# Patient Record
Sex: Male | Born: 1950 | Race: White | Hispanic: No | Marital: Married | Smoking: Former smoker
Health system: Southern US, Community
[De-identification: ages and names within clinical notes are randomized; demographics above are authoritative.]

## PROBLEM LIST (undated history)

## (undated) DIAGNOSIS — R7303 Prediabetes: Secondary | ICD-10-CM

## (undated) DIAGNOSIS — F32A Depression, unspecified: Secondary | ICD-10-CM

## (undated) DIAGNOSIS — I10 Essential (primary) hypertension: Secondary | ICD-10-CM

## (undated) DIAGNOSIS — M1990 Unspecified osteoarthritis, unspecified site: Secondary | ICD-10-CM

## (undated) DIAGNOSIS — N200 Calculus of kidney: Secondary | ICD-10-CM

## (undated) DIAGNOSIS — T4145XA Adverse effect of unspecified anesthetic, initial encounter: Secondary | ICD-10-CM

## (undated) DIAGNOSIS — T8859XA Other complications of anesthesia, initial encounter: Secondary | ICD-10-CM

## (undated) DIAGNOSIS — C801 Malignant (primary) neoplasm, unspecified: Secondary | ICD-10-CM

## (undated) DIAGNOSIS — F419 Anxiety disorder, unspecified: Secondary | ICD-10-CM

## (undated) DIAGNOSIS — Z87442 Personal history of urinary calculi: Secondary | ICD-10-CM

## (undated) DIAGNOSIS — F329 Major depressive disorder, single episode, unspecified: Secondary | ICD-10-CM

## (undated) DIAGNOSIS — N529 Male erectile dysfunction, unspecified: Secondary | ICD-10-CM

## (undated) DIAGNOSIS — I4892 Unspecified atrial flutter: Secondary | ICD-10-CM

## (undated) DIAGNOSIS — F1010 Alcohol abuse, uncomplicated: Secondary | ICD-10-CM

## (undated) HISTORY — DX: Calculus of kidney: N20.0

## (undated) HISTORY — PX: OTHER SURGICAL HISTORY: SHX169

## (undated) HISTORY — DX: Anxiety disorder, unspecified: F41.9

## (undated) HISTORY — DX: Depression, unspecified: F32.A

## (undated) HISTORY — PX: ROTATOR CUFF REPAIR: SHX139

## (undated) HISTORY — DX: Male erectile dysfunction, unspecified: N52.9

## (undated) HISTORY — PX: ARTHROSCOPIC REPAIR ACL: SUR80

## (undated) HISTORY — DX: Alcohol abuse, uncomplicated: F10.10

---
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
## 2007-01-03 ENCOUNTER — Emergency Department: Payer: Self-pay | Admitting: Emergency Medicine

## 2008-04-12 ENCOUNTER — Encounter: Admission: RE | Admit: 2008-04-12 | Discharge: 2008-04-12 | Payer: Self-pay | Admitting: Orthopedic Surgery

## 2009-11-29 HISTORY — PX: COLONOSCOPY: SHX174

## 2009-12-02 ENCOUNTER — Ambulatory Visit: Payer: Self-pay | Admitting: Family Medicine

## 2010-01-01 ENCOUNTER — Ambulatory Visit: Payer: Self-pay | Admitting: Family Medicine

## 2011-11-15 ENCOUNTER — Ambulatory Visit (INDEPENDENT_AMBULATORY_CARE_PROVIDER_SITE_OTHER): Payer: PRIVATE HEALTH INSURANCE | Admitting: Family Medicine

## 2011-11-15 VITALS — BP 142/90 | HR 66 | Wt 242.0 lb

## 2011-11-15 DIAGNOSIS — R079 Chest pain, unspecified: Secondary | ICD-10-CM

## 2011-11-15 DIAGNOSIS — R0781 Pleurodynia: Secondary | ICD-10-CM

## 2011-11-15 DIAGNOSIS — N529 Male erectile dysfunction, unspecified: Secondary | ICD-10-CM | POA: Insufficient documentation

## 2011-11-15 DIAGNOSIS — L723 Sebaceous cyst: Secondary | ICD-10-CM

## 2011-11-15 MED ORDER — CARISOPRODOL 350 MG PO TABS: 350.0000 mg | ORAL_TABLET | Freq: Four times a day (QID) | ORAL | Status: AC | PRN

## 2011-11-15 MED ORDER — TADALAFIL 20 MG PO TABS: 20.0000 mg | ORAL_TABLET | Freq: Every day | ORAL | Status: DC | PRN

## 2011-11-15 NOTE — Progress Notes (Signed)
  Subjective:    Patient ID: Lee Lee, male    DOB: February 11, 1951, 60 y.o.   MRN: [id number]  HPI Approximately 10 days ago while playing golf he injured the left lateral rib area. Apparently he hit a stump area and he was able to continue to play. Approximately 3 days after that while reaching or his seatbelt he had the onset of excruciating pain/spasm in the same area. He would also like his Cialis renewed. He also has a lesion present on his left shin that has been there for several months. He indicates that he had a similar lesion several years ago that went away with using ice.   Review of Systems     Objective:   Physical Exam Alert and in no distress. Exam of the chest shows no visible lesions. There is minimal tenderness to palpation in the lateral rib area. Pain was elicited with right lateral motion. Lungs clear to auscultation. No pain with chest wall compression. He has a 3 cm raised fluctuant movable lesion present on the shin.       Assessment & Plan:   1. Rib pain   2. ED (erectile dysfunction)   3. Sebaceous cyst    commend heat, Advil and will give soma compound. Cialis was renewed. Recommend he return here for excision of the cyst at his convenience.

## 2011-11-15 NOTE — Patient Instructions (Signed)
Use heat to the area for 20 minutes 3 times per day. Take 4 Advil 3 times per day regularly for the next week use a muscle relaxer especially at night

## 2011-11-22 ENCOUNTER — Encounter: Payer: Self-pay | Admitting: Internal Medicine

## 2011-11-24 ENCOUNTER — Ambulatory Visit (INDEPENDENT_AMBULATORY_CARE_PROVIDER_SITE_OTHER): Payer: PRIVATE HEALTH INSURANCE | Admitting: Family Medicine

## 2011-11-24 VITALS — BP 146/90 | HR 61 | Wt 248.0 lb

## 2011-11-24 DIAGNOSIS — M6740 Ganglion, unspecified site: Secondary | ICD-10-CM

## 2011-11-24 NOTE — Progress Notes (Signed)
  Subjective:    Patient ID: Lee Lee, male    DOB: 15-Dec-1950, 60 y.o.   MRN: [id number]  HPI He is here for removal of a lesion present on his right it is several centimeters below the knee joint.  Review of Systems     Objective:   Physical Exam A 3 cm round smooth nontender movable lesion is noted on the right shin several centimeters below the knee joint. The area was injected with Xylocaine and a 2 cm incision was made. Clear gelatinous material was removed from it without difficulty. The wound was explored. One 5-0 suture was applied.      Assessment & Plan:  Ganglion cyst He will keep the area clean and dry and return here in one week.

## 2012-09-24 ENCOUNTER — Observation Stay (HOSPITAL_COMMUNITY)
Admission: EM | Admit: 2012-09-24 | Discharge: 2012-09-25 | Disposition: A | Discharge status: Home / Self Care | Payer: PRIVATE HEALTH INSURANCE | Attending: Emergency Medicine | Admitting: Emergency Medicine

## 2012-09-24 ENCOUNTER — Emergency Department (HOSPITAL_COMMUNITY): Payer: PRIVATE HEALTH INSURANCE

## 2012-09-24 ENCOUNTER — Other Ambulatory Visit: Payer: Self-pay

## 2012-09-24 ENCOUNTER — Encounter (HOSPITAL_COMMUNITY): Payer: Self-pay | Admitting: Nurse Practitioner

## 2012-09-24 DIAGNOSIS — R079 Chest pain, unspecified: Secondary | ICD-10-CM

## 2012-09-24 DIAGNOSIS — Z87448 Personal history of other diseases of urinary system: Secondary | ICD-10-CM | POA: Insufficient documentation

## 2012-09-24 DIAGNOSIS — Z8679 Personal history of other diseases of the circulatory system: Secondary | ICD-10-CM | POA: Insufficient documentation

## 2012-09-24 DIAGNOSIS — R0602 Shortness of breath: Secondary | ICD-10-CM | POA: Insufficient documentation

## 2012-09-24 DIAGNOSIS — Z87442 Personal history of urinary calculi: Secondary | ICD-10-CM | POA: Insufficient documentation

## 2012-09-24 DIAGNOSIS — R0789 Other chest pain: Principal | ICD-10-CM | POA: Insufficient documentation

## 2012-09-24 LAB — COMPREHENSIVE METABOLIC PANEL
AST: 58 U/L — ABNORMAL HIGH (ref 0–37)
Albumin: 3.6 g/dL (ref 3.5–5.2)
Alkaline Phosphatase: 70 U/L (ref 39–117)
Chloride: 99 mEq/L (ref 96–112)
Potassium: 3.2 mEq/L — ABNORMAL LOW (ref 3.5–5.1)
Sodium: 134 mEq/L — ABNORMAL LOW (ref 135–145)
Total Bilirubin: 0.4 mg/dL (ref 0.3–1.2)
Total Protein: 6.8 g/dL (ref 6.0–8.3)

## 2012-09-24 LAB — POCT I-STAT TROPONIN I: Troponin i, poc: 0 ng/mL (ref 0.00–0.08)

## 2012-09-24 LAB — URINALYSIS, ROUTINE W REFLEX MICROSCOPIC
Glucose, UA: NEGATIVE mg/dL
Leukocytes, UA: NEGATIVE
Protein, ur: NEGATIVE mg/dL
Specific Gravity, Urine: 1.03 (ref 1.005–1.030)
Urobilinogen, UA: 0.2 mg/dL (ref 0.0–1.0)

## 2012-09-24 LAB — URINE MICROSCOPIC-ADD ON

## 2012-09-24 LAB — BASIC METABOLIC PANEL
Calcium: 9.4 mg/dL (ref 8.4–10.5)
Chloride: 100 mEq/L (ref 96–112)
GFR calc Af Amer: >90 mL/min (ref >90)
GFR calc non Af Amer: >90 mL/min (ref >90)

## 2012-09-24 LAB — CBC
HCT: 43.5 % (ref 39.0–52.0)
MCV: 98 fL (ref 78.0–100.0)
RBC: 4.44 MIL/uL (ref 4.22–5.81)
WBC: 9 10*3/uL (ref 4.0–10.5)

## 2012-09-24 LAB — PROTIME-INR: Prothrombin Time: 13.6 seconds (ref 11.6–15.2)

## 2012-09-24 MED ORDER — SODIUM CHLORIDE 0.9 % IV SOLN
1000.0000 mL | INTRAVENOUS | Status: DC
  Administered 2012-09-24: 1000 mL via INTRAVENOUS

## 2012-09-24 MED ORDER — ACETAMINOPHEN 325 MG PO TABS
650.0000 mg | ORAL_TABLET | Freq: Once | ORAL | Status: AC
  Administered 2012-09-24: 650 mg via ORAL
  Filled 2012-09-24: qty 2

## 2012-09-24 MED ORDER — ASPIRIN 81 MG PO CHEW
324.0000 mg | CHEWABLE_TABLET | Freq: Once | ORAL | Status: AC
  Administered 2012-09-24: 324 mg via ORAL
  Filled 2012-09-24: qty 4

## 2012-09-24 MED ORDER — NITROGLYCERIN 0.4 MG SL SUBL
0.4000 mg | SUBLINGUAL_TABLET | SUBLINGUAL | Status: AC | PRN
  Administered 2012-09-24 – 2012-09-25 (×3): 0.4 mg via SUBLINGUAL
  Filled 2012-09-24: qty 25

## 2012-09-24 NOTE — ED Notes (Signed)
Pt states some relief of chest pain, rating 2/10 at the time.

## 2012-09-24 NOTE — ED Notes (Signed)
Report given to Anne Ng, Therapist, sports. Pt to be moved to CDU on chest pain protocol.

## 2012-09-24 NOTE — ED Notes (Signed)
Today he began to have tightness in chest and SOB while playing golf. Pt reports Friday he felt similar pain while playing tennis. Pt pt denies cardiac history. Pt reports he has felt his heart racing.

## 2012-09-24 NOTE — ED Notes (Signed)
Pt c/o of headache due to nitro given/ Notified PA [employer]

## 2012-09-24 NOTE — ED Provider Notes (Signed)
History     CSN: [id number]  Arrival date & time 09/24/12  1649   None     Chief Complaint  Patient presents with  . Chest Pain    (Consider location/radiation/quality/duration/timing/severity/associated sxs/prior treatment) HPI Comments: 61 year old man says that he had shortness of breath after playing tennis 2 evenings ago. He took 82 baby aspirin this with relief at that time and did not seek medical attention. Today he was golfing and developed chest pressure across his chest around 1 PM. He played 8 holes of golf and walk off the golf course on a pole. He says he's had chest pressure in the past, couple of times in the past 6 months, which would usually resolve if he drank a glass of water. He did not seek evaluation for those episodes. He was observed at the chest pain by Rodell Perna M.D., orthopedist, would advised him and his wife that he needed to have examination and testing for chest pain, and he therefore came to [facility] for evaluation.  Patient is a 61 y.o. male presenting with chest pain. The history is provided by the patient and the spouse. No language interpreter was used.  Chest Pain The chest pain began 3 - 5 hours ago. Chest pain occurs intermittently. The chest pain is resolved. The pain is associated with exertion. At its most intense, the pain is at 5/10. The quality of the pain is described as pressure-like. The pain does not radiate. Chest pain is worsened by exertion. Primary symptoms include shortness of breath. Pertinent negatives for primary symptoms include no fever. He tried aspirin for the symptoms. Risk factors include male gender and obesity. Past medical history comments: Kidney stones, hemorrhoids.     Past Medical History  Diagnosis Date  . Calcium oxalate renal stones   . Hemorrhoids   . ED (erectile dysfunction)     Past Surgical History  Procedure Date  . Colonoscopy 11/2009    Dr. Benson Norway    History reviewed. No pertinent family  history.  History  Substance Use Topics  . Smoking status: Never Smoker   . Smokeless tobacco: Not on file  . Alcohol Use: Yes      Review of Systems  Constitutional: Negative.  Negative for fever and chills.  HENT: Negative.   Eyes: Negative.   Respiratory: Positive for shortness of breath.   Cardiovascular: Positive for chest pain.  Gastrointestinal: Negative.   Musculoskeletal: Negative.   Neurological: Negative.   Hematological: Negative.   Psychiatric/Behavioral: Negative.     Allergies  Review of patient's allergies indicates no known allergies.  Home Medications   Current Outpatient Rx  Name Route Sig Dispense Refill  . IBUPROFEN 200 MG PO TABS Oral Take 200 mg by mouth every 6 (six) hours as needed.      . MULTI-VITAMIN/MINERALS PO TABS Oral Take 1 tablet by mouth daily.        BP 166/119  Pulse 136  Temp 97 F (36.1 C)  Resp 16  SpO2 97%  Physical Exam  Nursing note and vitals reviewed. Constitutional: He is oriented to person, place, and time. He appears well-developed and well-nourished. No distress.       BP 166/119  HENT:  Head: Normocephalic and atraumatic.  Right Ear: External ear normal.  Left Ear: External ear normal.  Mouth/Throat: Oropharynx is clear and moist.  Eyes: Conjunctivae normal and EOM are normal. Pupils are equal, round, and reactive to light.  Neck: Normal range of motion. Neck  supple.  Cardiovascular: Normal rate, regular rhythm and normal heart sounds.   Pulmonary/Chest: Effort normal and breath sounds normal.  Abdominal: Soft. Bowel sounds are normal.  Musculoskeletal: Normal range of motion. He exhibits no edema and no tenderness.  Neurological: He is alert and oriented to person, place, and time.       No sensory or motor deficit.  Skin: Skin is warm and dry.  Psychiatric: He has a normal mood and affect. His behavior is normal.    ED Course  Procedures (including critical care time)   Labs Reviewed  CBC  BASIC  METABOLIC PANEL   0:37 PM  Date: 09/24/2012  Rate: 134  Rhythm: sinus tachycardia  QRS Axis: normal  Intervals: normal  ST/T Wave abnormalities: nonspecific ST/T changes  Conduction Disutrbances:none  Narrative Interpretation: Abnormal EKG  Old EKG Reviewed: none available   Results for orders placed during the hospital encounter of 09/24/12  CBC      Component Value Range   WBC 9.0  4.0 - 10.5 K/uL   RBC 4.44  4.22 - 5.81 MIL/uL   Hemoglobin 15.5  13.0 - 17.0 g/dL   HCT 43.5  39.0 - 52.0 %   MCV 98.0  78.0 - 100.0 fL   MCH 34.9 (*) 26.0 - 34.0 pg   MCHC 35.6  30.0 - 36.0 g/dL   RDW 13.0  11.5 - 15.5 %   Platelets 184  150 - 400 K/uL  BASIC METABOLIC PANEL      Component Value Range   Sodium 138  135 - 145 mEq/L   Potassium 3.4 (*) 3.5 - 5.1 mEq/L   Chloride 100  96 - 112 mEq/L   CO2 26  19 - 32 mEq/L   Glucose, Bld 112 (*) 70 - 99 mg/dL   BUN 15  6 - 23 mg/dL   Creatinine, Ser 0.66  0.50 - 1.35 mg/dL   Calcium 9.4  8.4 - 10.5 mg/dL   GFR calc non Af Amer >90  >90 mL/min   GFR calc Af Amer >90  >90 mL/min  POCT I-STAT TROPONIN I      Component Value Range   Troponin i, poc 0.03  0.00 - 0.08 ng/mL   Comment 3            Dg Chest 2 View  09/24/2012  *RADIOLOGY REPORT*  Clinical Data: Shortness of breath and chest pain 2 minutes ago. Recurrent chest pain today.  CHEST - 2 VIEW  Comparison: None.  Findings: The heart size is normal.  The lungs are clear.  There is fusion across anterior vertebral bodies through the thoracic spine. The vertebral body heights are maintained.  IMPRESSION:  1.  Low lung volumes. 2.  No acute cardiopulmonary disease. 3.  Findings compatible with DISH.   Original Report Authenticated By: Resa Miner. MATTERN, M.D.    6:34 PM.  Lab tests reassuring, did not have STEMI or NSTEMI.  Will move to CDU for chest pain protocol, to have coronary CT in AM.   1. Chest pain          Mylinda Latina III, MD 09/24/12 [phone]

## 2012-09-24 NOTE — ED Provider Notes (Signed)
Assumed care of patient in the CDU.  Patient is currently on the Chest Pain Protocol.  Plan is for that patient to have a Coronary CT in the morning.  Patient presented to the ED with a chief complaint of chest pain and SOB that began today while playing Golf.  He had similar pain two days ago while playing tennis.  No prior cardiac history.  No history of HTN, hyperlipidemia, or DM.  He has never smoked.  Patient is currently not having any CP or SOB.  Reassessed patient.  Patient alert and orientated x 3, Heart RRR, Lungs CTAB, Abdomen soft and nontender, LE without edema.  11:40 PM Reassessed patient.  Patient reports that he is not having any chest pain at this time.  Patient alert and orientated x 3, Heart RRR, Lungs CTAB, Abdomen soft and nontender, LE without edema.  11:58 PM Will sign patient out to Dr. Randal Buba who will assume care of the patient overnight.  Sherlyn Lees Jasper, PA-C 09/24/12 2359

## 2012-09-24 NOTE — ED Notes (Signed)
Per pt request/ pt wanting a sandwich advised pt that he will have to be NPO @4a ,m pt agreed

## 2012-09-24 NOTE — ED Notes (Signed)
Pt undressed, in gown, on monitor, continuous pulse oximetry and blood pressure cuff; family at bedside

## 2012-09-24 NOTE — ED Notes (Signed)
Explained Chest Pain protocol to patient

## 2012-09-24 NOTE — ED Notes (Signed)
Pt presents to department for evaluation of midsternal non radiating chest pain and SOB. Onset Friday while playing tennis, states he had same episode tonight while playing golf. States 4/10 chest pressure at the time. Respirations unlabored. Lung sounds clear and equal bilaterally. Pt is conscious alert and oriented x4. Skin warm and dry.

## 2012-09-25 ENCOUNTER — Other Ambulatory Visit: Payer: Self-pay | Admitting: *Deleted

## 2012-09-25 ENCOUNTER — Observation Stay (HOSPITAL_COMMUNITY): Payer: PRIVATE HEALTH INSURANCE

## 2012-09-25 DIAGNOSIS — I77810 Thoracic aortic ectasia: Secondary | ICD-10-CM

## 2012-09-25 MED ORDER — IOHEXOL 350 MG/ML SOLN
80.0000 mL | Freq: Once | INTRAVENOUS | Status: AC | PRN
  Administered 2012-09-25: 80 mL via INTRAVENOUS

## 2012-09-25 MED ORDER — ACETAMINOPHEN 325 MG PO TABS
650.0000 mg | ORAL_TABLET | Freq: Once | ORAL | Status: AC
  Administered 2012-09-25: 650 mg via ORAL
  Filled 2012-09-25: qty 2

## 2012-09-25 MED ORDER — METOPROLOL TARTRATE 1 MG/ML IV SOLN
INTRAVENOUS | Status: AC
  Administered 2012-09-25: 5 mg
  Filled 2012-09-25: qty 5

## 2012-09-25 MED ORDER — METOPROLOL TARTRATE 25 MG PO TABS
50.0000 mg | ORAL_TABLET | Freq: Once | ORAL | Status: AC
  Administered 2012-09-25: 50 mg via ORAL
  Filled 2012-09-25: qty 2

## 2012-09-25 NOTE — ED Notes (Addendum)
Reassessed pt pain, pt denies pain at the time

## 2012-09-25 NOTE — ED Notes (Signed)
Pt BMI is 31.9/ Pt Pulse is 63/ ordered 50 mg metoprolol per protocol for CTA

## 2012-09-25 NOTE — ED Provider Notes (Signed)
Medical screening examination/treatment/procedure(s) were performed by non-physician practitioner and as supervising physician I was immediately available for consultation/collaboration.  Veryl Speak, MD 09/25/12 [phone]

## 2012-09-25 NOTE — ED Notes (Signed)
Pt return from CT.

## 2012-09-25 NOTE — ED Provider Notes (Signed)
Pt seen and examined by me in CDU. Pt with episode of chest pressure after playing tennis 3 days ago, and after playing golf yesterday. States associated with shortness of breath. No hx of cardiac problems. Pt in CDU on CP protocol, with cardiac CT to be done this AM.   Pt had uneventful night, he has been symptom free. Labs unremarkable with negative 3 sets of cardiac enzymes.   Exam: Pt in NAD. AAOx3. PERRLA. Neck is supple. Regular HR and rhythm. Lungs are clear to auscultation bilaterally. Normal coordination. Normal gait. Neurovascularly intact.    Filed Vitals:   09/25/12 0522  BP: 154/96  Pulse: 63  Temp:   Resp:     8:05 AM Pt taken to CT. Chest pain free.    9:47 AM Results for orders placed during the hospital encounter of 09/24/12  CBC      Component Value Range   WBC 9.0  4.0 - 10.5 K/uL   RBC 4.44  4.22 - 5.81 MIL/uL   Hemoglobin 15.5  13.0 - 17.0 g/dL   HCT 43.5  39.0 - 52.0 %   MCV 98.0  78.0 - 100.0 fL   MCH 34.9 (*) 26.0 - 34.0 pg   MCHC 35.6  30.0 - 36.0 g/dL   RDW 13.0  11.5 - 15.5 %   Platelets 184  150 - 400 K/uL  BASIC METABOLIC PANEL      Component Value Range   Sodium 138  135 - 145 mEq/L   Potassium 3.4 (*) 3.5 - 5.1 mEq/L   Chloride 100  96 - 112 mEq/L   CO2 26  19 - 32 mEq/L   Glucose, Bld 112 (*) 70 - 99 mg/dL   BUN 15  6 - 23 mg/dL   Creatinine, Ser 0.66  0.50 - 1.35 mg/dL   Calcium 9.4  8.4 - 10.5 mg/dL   GFR calc non Af Amer >90  >90 mL/min   GFR calc Af Amer >90  >90 mL/min  COMPREHENSIVE METABOLIC PANEL      Component Value Range   Sodium 134 (*) 135 - 145 mEq/L   Potassium 3.2 (*) 3.5 - 5.1 mEq/L   Chloride 99  96 - 112 mEq/L   CO2 26  19 - 32 mEq/L   Glucose, Bld 107 (*) 70 - 99 mg/dL   BUN 14  6 - 23 mg/dL   Creatinine, Ser 0.69  0.50 - 1.35 mg/dL   Calcium 9.0  8.4 - 10.5 mg/dL   Total Protein 6.8  6.0 - 8.3 g/dL   Albumin 3.6  3.5 - 5.2 g/dL   AST 58 (*) 0 - 37 U/L   ALT 37  0 - 53 U/L   Alkaline Phosphatase 70  39 - 117  U/L   Total Bilirubin 0.4  0.3 - 1.2 mg/dL   GFR calc non Af Amer >90  >90 mL/min   GFR calc Af Amer >90  >90 mL/min  PROTIME-INR      Component Value Range   Prothrombin Time 13.6  11.6 - 15.2 seconds   INR 1.05  0.00 - 1.49  APTT      Component Value Range   aPTT 31  24 - 37 seconds  URINALYSIS, ROUTINE W REFLEX MICROSCOPIC      Component Value Range   Color, Urine YELLOW  YELLOW   APPearance CLOUDY (*) CLEAR   Specific Gravity, Urine 1.030  1.005 - 1.030   pH 5.5  5.0 - 8.0  Glucose, UA NEGATIVE  NEGATIVE mg/dL   Hgb urine dipstick MODERATE (*) NEGATIVE   Bilirubin Urine SMALL (*) NEGATIVE   Ketones, ur NEGATIVE  NEGATIVE mg/dL   Protein, ur NEGATIVE  NEGATIVE mg/dL   Urobilinogen, UA 0.2  0.0 - 1.0 mg/dL   Nitrite NEGATIVE  NEGATIVE   Leukocytes, UA NEGATIVE  NEGATIVE  POCT I-STAT TROPONIN I      Component Value Range   Troponin i, poc 0.03  0.00 - 0.08 ng/mL   Comment 3           POCT I-STAT TROPONIN I      Component Value Range   Troponin i, poc 0.01  0.00 - 0.08 ng/mL   Comment 3           URINE MICROSCOPIC-ADD ON      Component Value Range   Squamous Epithelial / LPF RARE  RARE   WBC, UA 0-2  <3 WBC/hpf   RBC / HPF 3-6  <3 RBC/hpf   Bacteria, UA RARE  RARE   Casts HYALINE CASTS (*) NEGATIVE   Urine-Other MUCOUS PRESENT    POCT I-STAT TROPONIN I      Component Value Range   Troponin i, poc 0.00  0.00 - 0.08 ng/mL   Comment 3            Dg Chest 2 View  09/24/2012  *RADIOLOGY REPORT*  Clinical Data: Shortness of breath and chest pain 2 minutes ago. Recurrent chest pain today.  CHEST - 2 VIEW  Comparison: None.  Findings: The heart size is normal.  The lungs are clear.  There is fusion across anterior vertebral bodies through the thoracic spine. The vertebral body heights are maintained.  IMPRESSION:  1.  Low lung volumes. 2.  No acute cardiopulmonary disease. 3.  Findings compatible with DISH.   Original Report Authenticated By: Resa Miner. MATTERN, M.D.     Ct Heart Morp W/cta Cor W/score W/ca W/cm &/or Wo/cm  09/25/2012  *RADIOLOGY REPORT*  INDICATION:  Chest pain 2 days ago and today.  Pressure.  No radiation.  CT ANGIOGRAPHY OF THE HEART, CORONARY ARTERY, STRUCTURE, AND MORPHOLOGY  CONTRAST: 108m OMNIPAQUE IOHEXOL 350 MG/ML SOLN  COMPARISON:  Plain film of 1 day prior  TECHNIQUE:  CT angiography of the coronary vessels was performed on a 256 channel system using prospective ECG gating.  A scout and noncontrast exam (for calcium scoring) were performed.  Circulation time was measured using a test bolus.  Coronary CTA was performed with sub mm slice collimation during portions of the cardiac cycle after prior injection of iodinated contrast.  Imaging post processing was performed on an independent workstation creating multiplanar and 3-D images, and quantitative analysis of the heart and coronary arteries.  Note that this exam targets the heart and the chest was not imaged in its entirety.  PREMEDICATION: Lopressor 50 mg, P.O. Lopressor 5.0 mg, IV Nitroglycerin 0.4 mcg, sublingual.  FINDINGS: Technical quality:  Moderate  Heart rate:  59  CORONARY ARTERIES: Left main coronary artery:  Long vessel which arises from the left coronary cusp and is normal. Left anterior descending:  Gives rise to a diminutive patent first diagonal.  Moderate sized second diagonal which is normal.  A third diminutive patent diagonal.  Somewhat poorly evaluated distally, but appears patent as it wraps around cardiac apex. A ramus branch is present.  This is moderate sized and negative. Left circumflex:  Moderate sized, nondominant.  Gives rise to a moderate-sized branching  obtuse marginal, which is negative. Portions of the more distal left circumflex are poorly evaluated. Appears patent distally. Right coronary artery:  Large, dominant vessel without significant disease.  Continues distally to supply the PDA and posterolateral left ventricular branches. Gives rise to a moderate-sized  acute marginal. Posterior descending artery:  Normal. Dominance:  Right-sided  CORONARY CALCIUM: Total Agatston Score:  0  CARDIAC No left atrial appendage thrombus.  No septal defect.  No cardiac mass.  AORTA AND PULMONARY MEASUREMENTS: Aortic root (21 - 40 mm):             29  at the annulus             43  at the sinuses of Valsalva             34  at the sinotubular junction Ascending aorta ( <  40 mm):  40 Descending aorta ( <  40 mm):  34 Main pulmonary artery:  ( <  30 mm):  31  EXTRACARDIAC FINDINGS: Lung windows demonstrate no airspace opacities.  Soft tissue windows demonstrate no imaged thoracic adenopathy.  No aortic dissection.  Trace pleural thickening.  No pericardial effusion.  Limited abdominal imaging demonstrates moderate hepatic steatosis.  Advanced thoracic spondylosis.  IMPRESSION:  1.  No coronary artery disease.  The patient's total coronary artery calcium score is 0. 2.  Right-sided coronary artery dominance. 3.  Minimal dilatation of the pulmonary arteries.  This could represent pulmonary arterial hypertension. 4.  Borderline/mild dilatation of the ascending aorta and sinuses of Valsalva.  Report was called to CDU mid level at 9:20 a.m. on 09/25/2012.   Original Report Authenticated By: Areta Haber, M.D.     9:47 AM Cardiac CT negative for CAD. There was minimal dilatation of pulmonary arteries and ascending aorta and sinuses. This was Discussed With Dr. Jobe Igo, who recommended outpatient cardiology follow up for an echo. I spoke with [facility] who will contact pt either today or tomorrow for a follow up appointment. I discussed results and the plan with the pt. Pt is stable for d/c home.   Filed Vitals:   09/25/12 0953  BP: 149/84  Pulse: 54  Temp: 98.7 F (37.1 C)  Resp: 21       [place] Silveria A Rosemaria Inabinet, PA 09/25/12 1009

## 2012-09-25 NOTE — Progress Notes (Signed)
Utilization review completed.  

## 2012-09-25 NOTE — ED Provider Notes (Signed)
Medical screening examination/treatment/procedure(s) were conducted as a shared visit with non-physician practitioner(s) and myself.  I personally evaluated the patient during the encounter See my note.   Mylinda Latina III, MD 09/25/12 [phone]

## 2012-09-28 ENCOUNTER — Ambulatory Visit (HOSPITAL_COMMUNITY): Payer: PRIVATE HEALTH INSURANCE | Attending: Cardiology | Admitting: Radiology

## 2012-09-28 DIAGNOSIS — R072 Precordial pain: Secondary | ICD-10-CM

## 2012-09-28 DIAGNOSIS — I77810 Thoracic aortic ectasia: Secondary | ICD-10-CM

## 2012-09-28 DIAGNOSIS — I080 Rheumatic disorders of both mitral and aortic valves: Secondary | ICD-10-CM | POA: Insufficient documentation

## 2012-09-28 NOTE — Progress Notes (Signed)
Echocardiogram performed.  

## 2012-10-02 ENCOUNTER — Encounter: Payer: Self-pay | Admitting: *Deleted

## 2012-10-03 ENCOUNTER — Other Ambulatory Visit (HOSPITAL_COMMUNITY): Payer: PRIVATE HEALTH INSURANCE

## 2012-10-05 ENCOUNTER — Ambulatory Visit (INDEPENDENT_AMBULATORY_CARE_PROVIDER_SITE_OTHER): Payer: PRIVATE HEALTH INSURANCE | Admitting: Family Medicine

## 2012-10-05 DIAGNOSIS — R002 Palpitations: Secondary | ICD-10-CM

## 2012-10-05 LAB — LIPID PANEL: Cholesterol: 196 mg/dL (ref 0–200)

## 2012-10-05 LAB — TSH: TSH: 0.668 u[IU]/mL (ref 0.350–4.500)

## 2012-10-05 NOTE — Progress Notes (Signed)
  Subjective:    Patient ID: Lee Lee, male    DOB: 07/24/1951, 61 y.o.   MRN: [id number]  HPI He is here for consultation after recent visit to the emergency room for evaluation of chest discomfort. The ER record including echocardiogram was reviewed. All the blood work was also reviewed all of which was essentially negative. The echocardiogram was also negative. Further history indicates he has had episodes of palpitations usually occurring 2 or 3 times per month they can be random in nature. He is on no be decongestants, drinks one cup of coffee per day. He is on no other medications   Review of Systems     Objective:   Physical Exam Alert and in no distress. Cardiac exam shows regular rhythm without murmurs gallops. Lungs are clear to auscultation.       Assessment & Plan:   1. Palpitations  TSH, Lipid panel, Cardiac event monitor

## 2012-10-24 ENCOUNTER — Encounter: Payer: PRIVATE HEALTH INSURANCE | Admitting: Physician Assistant

## 2012-10-31 ENCOUNTER — Encounter: Payer: PRIVATE HEALTH INSURANCE | Admitting: Physician Assistant

## 2012-11-01 ENCOUNTER — Encounter: Payer: PRIVATE HEALTH INSURANCE | Admitting: Physician Assistant

## 2012-11-01 ENCOUNTER — Encounter (INDEPENDENT_AMBULATORY_CARE_PROVIDER_SITE_OTHER): Payer: PRIVATE HEALTH INSURANCE

## 2012-11-01 DIAGNOSIS — R002 Palpitations: Secondary | ICD-10-CM

## 2012-11-06 ENCOUNTER — Encounter: Payer: PRIVATE HEALTH INSURANCE | Admitting: Cardiology

## 2012-11-06 ENCOUNTER — Ambulatory Visit (INDEPENDENT_AMBULATORY_CARE_PROVIDER_SITE_OTHER): Payer: PRIVATE HEALTH INSURANCE | Admitting: Cardiology

## 2012-11-06 ENCOUNTER — Encounter: Payer: Self-pay | Admitting: Cardiology

## 2012-11-06 VITALS — BP 166/96 | HR 65 | Ht 72.0 in | Wt 246.0 lb

## 2012-11-06 DIAGNOSIS — I10 Essential (primary) hypertension: Secondary | ICD-10-CM

## 2012-11-06 DIAGNOSIS — I712 Thoracic aortic aneurysm, without rupture, unspecified: Secondary | ICD-10-CM

## 2012-11-06 DIAGNOSIS — R002 Palpitations: Secondary | ICD-10-CM

## 2012-11-06 DIAGNOSIS — I7121 Aneurysm of the ascending aorta, without rupture: Secondary | ICD-10-CM

## 2012-11-06 MED ORDER — LOSARTAN POTASSIUM 50 MG PO TABS: 50.0000 mg | ORAL_TABLET | Freq: Every day | ORAL | Status: DC

## 2012-11-06 NOTE — Progress Notes (Signed)
Patient ID: Lee Lee, male   DOB: September 21, 1951, 61 y.o.   MRN: [id number] PCP: Dr. Redmond School  61 yo with history of palpitations presents for cardiology evaluation.  For the last couple of years, patient has had episodes of tachypalpitations.  He has had 4 total this year.  They will last for 30-60 minutes at a time and have no particular trigger.  No lightheadedness or syncope.  In 59/13, he had tachypalpitations while playing golf with associated chest pressure.  He went to the ER.  While there, he had a coronary CT angiogram with no significant CAD.  Echo showed normal EF but the ascending aorta was dilated to 4.2 cm.  He is now wearing an event monitor.    Recently, he has felt well.  No recurrent tachypalpitations.  Good exercise tolerance with no exertional chest pain.  If he walks fast, he is out of breath after walking about a 1/2 mile.  BP is high today and has been high recently in Dr. Lanice Shirts office.   ECG: NSR, normal  Labs (11/13): TSH normal, LDL 89, HDL 56  PMH: 1. HTN 2. Erectile dysfunction 3. Palpitations: Echo (10/13) with EF 55-60%, mild LVH, trileaflet aortic valve with 4.1 cm aortic root and 4.2 cm ascending aorta.  4. Ascending aortic aneurysm: 4.2 cm on echo and CT in 10/13.  Aortic valve is trileaflet.  Suspect this is due to HTN.  5. Atypical chest pain: Coronary CT angiogram in 10/13 with coronary calcium score 0 and no significant CAD noted in the coronaries.   SH: Nonsmoker, married, works in Multimedia programmer.   FH: Mother had valvular heart disease, sounds like AI.   ROS: All systems reviewed and negative except as per HPI.   Current Outpatient Prescriptions  Medication Sig Dispense Refill  . tadalafil (CIALIS) 20 MG tablet Take 20 mg by mouth daily as needed. For ED      . losartan (COZAAR) 50 MG tablet Take 1 tablet (50 mg total) by mouth daily.  30 tablet  3  . tadalafil (CIALIS) 20 MG tablet Take 1 tablet (20 mg total) by mouth daily as needed for  erectile dysfunction.  10 tablet  11    BP 166/96  Pulse 65  Ht 6' (1.829 m)  Wt 246 lb (111.585 kg)  BMI 33.36 kg/m2 General: NAD Neck: No JVD, no thyromegaly or thyroid nodule.  Lungs: Clear to auscultation bilaterally with normal respiratory effort. CV: Nondisplaced PMI.  Heart regular S1/S2, +S4, no murmur.  No peripheral edema.  No carotid bruit.  Normal pedal pulses.  Abdomen: Soft, nontender, no hepatosplenomegaly, no distention.  Skin: Intact without lesions or rashes.  Neurologic: Alert and oriented x 3.  Psych: Normal affect. Extremities: No clubbing or cyanosis.  HEENT: Normal.   Assessment/Plan: 1. Palpitations: Patient has had episodes of tachypalpitations with heart pounding for 30-60 minutes.  Episodes are relatively rare (4 this year).  Last episode was associated with chest pressure.  No lightheadedness or syncope.  Possible SVT.  - He has on a 30 day event monitor.  I will follow to see if anything is found.  - I am going to start him on losartan today (see below).  If he needs additional BP control, would consider beta blocker as it may help limit palpitations.  2. HTN: BP running high.  I will start him on losartan 50 mg daily as ARBs may decrease progression of ascending aortic aneurysms.   BMET and BP check in 2 wks.  3. Ascending aortic aneurysm: Mild, 4.2 cm.  Trileaflet aortic valve.  I suspect this is due to hypertension (no evidence for collagen vascular disease).  Starting ARB as these have decreased progression of ascending aorta dilation in patients with collagen vascular disease and this patient additionally needs a medication for BP control.  I will get an MRA chest to follow thoracic aorta dimensions in 10/14.  If this is stable, can follow at 2-3 year intervals.   [name] 11/07/2012 4:35 PM

## 2012-11-06 NOTE — Patient Instructions (Addendum)
Take losartan 23m daily.  Your physician recommends that you return for lab work in: 2 weeks--BMET.  Take and record your blood pressure daily. I will call you in about 3 weeks to get the readings.   Your physician recommends that you schedule a follow-up appointment in: 2 months with Dr MAundra Dubin

## 2012-11-07 DIAGNOSIS — I10 Essential (primary) hypertension: Secondary | ICD-10-CM | POA: Insufficient documentation

## 2012-11-07 DIAGNOSIS — R002 Palpitations: Secondary | ICD-10-CM | POA: Insufficient documentation

## 2012-11-07 DIAGNOSIS — I7121 Aneurysm of the ascending aorta, without rupture: Secondary | ICD-10-CM | POA: Insufficient documentation

## 2012-11-07 DIAGNOSIS — I712 Thoracic aortic aneurysm, without rupture: Secondary | ICD-10-CM | POA: Insufficient documentation

## 2012-11-16 ENCOUNTER — Telehealth: Payer: Self-pay | Admitting: Cardiology

## 2012-11-16 NOTE — Telephone Encounter (Signed)
E cardio report showing svt was received from E [facility].  I talked to Dr Angelena Form who advised that pt be called.  Pt states he is doing fine.  No symptoms when he had the svt.  He states he was in the process of putting the monitor back on after his shower when it started buzzing or beeping.  He states his bp was 138/70 today.  He states he has had 3 episodes of fluttering in the past 2 weeks, none today. E cardio report was left for Dr Aundra Dubin to review when he is back in the office tomorrow.

## 2012-11-16 NOTE — Telephone Encounter (Signed)
New Problem:    Patient returned your call.  Please call back.

## 2012-11-16 NOTE — Telephone Encounter (Signed)
N/A.  LMTC. 

## 2012-11-17 MED ORDER — METOPROLOL SUCCINATE ER 25 MG PO TB24: 25.0000 mg | ORAL_TABLET | Freq: Every day | ORAL | Status: DC

## 2012-11-17 NOTE — Telephone Encounter (Signed)
Dr Aundra Dubin spoke with pt. He will add Toprol XL 40m daily.

## 2012-11-20 ENCOUNTER — Other Ambulatory Visit (INDEPENDENT_AMBULATORY_CARE_PROVIDER_SITE_OTHER): Payer: PRIVATE HEALTH INSURANCE

## 2012-11-20 DIAGNOSIS — I10 Essential (primary) hypertension: Secondary | ICD-10-CM

## 2012-11-20 LAB — BASIC METABOLIC PANEL
BUN: 16 mg/dL (ref 6–23)
Chloride: 104 mEq/L (ref 96–112)
Glucose, Bld: 114 mg/dL — ABNORMAL HIGH (ref 70–99)
Potassium: 4 mEq/L (ref 3.5–5.1)

## 2012-11-21 ENCOUNTER — Telehealth: Payer: Self-pay | Admitting: *Deleted

## 2012-11-21 NOTE — Telephone Encounter (Signed)
Message copied by Earvin Hansen on Tue Nov 21, 2012  8:24 AM ------      Message from: Larey Dresser      Created: Mon Nov 20, 2012  9:20 PM       Labs ok

## 2012-11-21 NOTE — Telephone Encounter (Signed)
Left message on recorder, no changes in medications

## 2012-12-07 ENCOUNTER — Encounter: Payer: Self-pay | Admitting: Cardiology

## 2012-12-07 ENCOUNTER — Ambulatory Visit (INDEPENDENT_AMBULATORY_CARE_PROVIDER_SITE_OTHER): Payer: BC Managed Care – PPO | Admitting: Cardiology

## 2012-12-07 VITALS — BP 144/100 | HR 92 | Ht 72.0 in | Wt 242.0 lb

## 2012-12-07 DIAGNOSIS — I4892 Unspecified atrial flutter: Secondary | ICD-10-CM | POA: Insufficient documentation

## 2012-12-07 DIAGNOSIS — I712 Thoracic aortic aneurysm, without rupture, unspecified: Secondary | ICD-10-CM

## 2012-12-07 DIAGNOSIS — I10 Essential (primary) hypertension: Secondary | ICD-10-CM

## 2012-12-07 DIAGNOSIS — I7121 Aneurysm of the ascending aorta, without rupture: Secondary | ICD-10-CM

## 2012-12-07 MED ORDER — METOPROLOL SUCCINATE ER 50 MG PO TB24: 50.0000 mg | ORAL_TABLET | Freq: Every day | ORAL | Status: DC

## 2012-12-07 MED ORDER — APIXABAN 5 MG PO TABS: 5.0000 mg | ORAL_TABLET | Freq: Two times a day (BID) | ORAL | Status: DC

## 2012-12-07 NOTE — Patient Instructions (Addendum)
Stop aspirin.   Start Eliquis(apixaban) 45m two times a day.   Increase Toprol XL (metoprolol) to 526mdaily. You can take 2 of your 2537mablets daily at the same time and use your current supply.  Your physician recommends that you schedule a follow-up appointment in: 2 months with Dr McLAundra Dubinhis is scheduled for Wednesday March 5,2014 at 8:15am.  Dr TayLovena Leursday January 16,2014 at 12:15pm.

## 2012-12-07 NOTE — Progress Notes (Signed)
Patient ID: Lee Lee, male   DOB: Oct 05, 1951, 62 y.o.   MRN: [id number] PCP: Dr. Redmond School  62 yo with history of palpitations returns for cardiology evaluation.  For the last couple of years, patient has had episodes of tachypalpitations.  There episodes have been occurring more frequently, especially over the last couple of months. They will last for 30-60 minutes at a time and have no particular trigger.  No lightheadedness or syncope.  In 10/13, he had tachypalpitations while playing golf with associated chest pressure.  He went to the ER.  While there, he had a coronary CT angiogram with no significant CAD.  Echo showed normal EF but the ascending aorta was dilated to 4.2 cm.    [name] wore and event monitor for 3 wks.  This showed paroxysmal atrial flutter with HR to as high as the 150s.  He has been under more stress recently with his sick father (who actually passed away this morning) and has been noticing more frequent runs of tachypalpitations.  I started him on Toprol XL.  This has not made much difference for his symptoms.   Labs (11/13): TSH normal, LDL 89, HDL 56 Labs (12/13): K 4, creatinine 0.8  PMH: 1. HTN 2. Erectile dysfunction 3. Palpitations: Echo (10/13) with EF 55-60%, mild LVH, trileaflet aortic valve with 4.1 cm aortic root and 4.2 cm ascending aorta.  4. Ascending aortic aneurysm: 4.2 cm on echo and CT in 10/13.  Aortic valve is trileaflet.  Suspect this is due to HTN.  5. Atypical chest pain: Coronary CT angiogram in 10/13 with coronary calcium score 0 and no significant CAD noted in the coronaries.  6. Paroxysmal atrial flutter: First noted on 3 week event monitor in 12/13.  SH: Nonsmoker, married, works in Multimedia programmer.   FH: Mother had valvular heart disease, sounds like AI.   ROS: All systems reviewed and negative except as per HPI.   Current Outpatient Prescriptions  Medication Sig Dispense Refill  . losartan (COZAAR) 50 MG tablet Take 1 tablet (50  mg total) by mouth daily.  30 tablet  3  . tadalafil (CIALIS) 20 MG tablet Take 20 mg by mouth daily as needed. For ED      . apixaban (ELIQUIS) 5 MG TABS tablet Take 1 tablet (5 mg total) by mouth 2 (two) times daily.  60 tablet  6  . metoprolol succinate (TOPROL XL) 50 MG 24 hr tablet Take 1 tablet (50 mg total) by mouth daily. Take with or immediately following a meal.  30 tablet  6    BP 144/100  Pulse 92  Ht 6' (1.829 m)  Wt 242 lb (109.77 kg)  BMI 32.82 kg/m2 General: NAD Neck: No JVD, no thyromegaly or thyroid nodule.  Lungs: Clear to auscultation bilaterally with normal respiratory effort. CV: Nondisplaced PMI.  Heart regular S1/S2, +S4, no murmur.  No peripheral edema.  No carotid bruit.  Normal pedal pulses.  Abdomen: Soft, nontender, no hepatosplenomegaly, no distention.  Skin: Intact without lesions or rashes.  Neurologic: Alert and oriented x 3.  Psych: Normal affect. Extremities: No clubbing or cyanosis.  HEENT: Normal.   Assessment/Plan: 1. Palpitations:  Due to paroxysmal atrial flutter.  He finds the flutter runs very bothersome.  He is on Toprol XL which does not seem to be having much effect.  We talked about possible treatment strategies, medical versus catheter ablation.  He would prefer catheter ablation.  CHADSVASC = 1 for HTN.  - Increase Toprol XL  to 50 mg daily.  - Start apixaban 5 mg bid in preparation for atrial flutter ablation.  Can stop ASA.  - EP evaluation for atrial flutter ablation.  2. HTN: BP running high but just heard that his father has died.  I will increase Toprol XL to 50 mg daily and continue losartan.  3. Ascending aortic aneurysm: Mild, 4.2 cm.  Trileaflet aortic valve.  I suspect this is due to hypertension (no evidence for collagen vascular disease).  Patient is on an ARB (ARBs seem to decrease progression of ascending aorta dilation in patients with collagen vascular disease).  I will get an MRA chest to follow thoracic aorta dimensions in  10/14.  If this is stable, can follow at 2-3 year intervals.   [name] 12/07/2012

## 2012-12-14 ENCOUNTER — Encounter: Payer: Self-pay | Admitting: Internal Medicine

## 2012-12-14 ENCOUNTER — Ambulatory Visit (INDEPENDENT_AMBULATORY_CARE_PROVIDER_SITE_OTHER): Payer: BC Managed Care – PPO | Admitting: Internal Medicine

## 2012-12-14 VITALS — BP 155/83 | HR 58 | Ht 72.0 in | Wt 241.1 lb

## 2012-12-14 DIAGNOSIS — I4892 Unspecified atrial flutter: Secondary | ICD-10-CM

## 2012-12-14 NOTE — Progress Notes (Signed)
HPI [name] is referred today by Dr. Marigene Ehlers for evaluation of atrial flutter. The patient has a history of hypertension. He also is a history of kidney stones. His health is otherwise been good. Several months ago he began to experience palpitations. Subsequent evaluation demonstrated periods of atrial flutter with rapid ventricular response. Additional evaluation demonstrated brief episodes of atrial fibrillation as well as probable atrial tachycardia. The patient cannot distinguish between his arrhythmias but notices that his heart race at times for several minutes to over an hour. He was placed on anticoagulation. He was placed on escalating doses of beta blocker. Since increasing his metoprolol XL to 50 mg daily, his symptoms of palpitations have improved though he has noted increasing fatigue and weakness. He desires not to be on more medical therapy than absolute necessary. No Known Allergies   Current Outpatient Prescriptions  Medication Sig Dispense Refill  . apixaban (ELIQUIS) 5 MG TABS tablet Take 1 tablet (5 mg total) by mouth 2 (two) times daily.  60 tablet  6  . losartan (COZAAR) 50 MG tablet Take 1 tablet (50 mg total) by mouth daily.  30 tablet  3  . metoprolol succinate (TOPROL XL) 50 MG 24 hr tablet Take 1 tablet (50 mg total) by mouth daily. Take with or immediately following a meal.  30 tablet  6  . tadalafil (CIALIS) 20 MG tablet Take 20 mg by mouth daily as needed. For ED         Past Medical History  Diagnosis Date  . Calcium oxalate renal stones   . Hemorrhoids   . ED (erectile dysfunction)     ROS:   All systems reviewed and negative except as noted in the HPI.   Past Surgical History  Procedure Date  . Colonoscopy 11/2009    Dr. Benson [place]     No family history on file.   History   Social History  . Marital Status: Married    Spouse Name: N/A    Number of Children: N/A  . Years of Education: N/A   Occupational History  . Not on file.   Social  History Main Topics  . Smoking status: Never Smoker   . Smokeless tobacco: Not on file  . Alcohol Use: Yes  . Drug Use: No  . Sexually Active:    Other Topics Concern  . Not on file   Social History Narrative  . No narrative on file     BP 155/83  Pulse 58  Ht 6' (1.829 m)  Wt 241 lb 1.9 oz (109.371 kg)  BMI 32.70 kg/m2  Physical Exam:  Well appearing middle-aged man, NAD HEENT: Unremarkable Neck:  6 cm JVD, no thyromegally Lungs:  Clear with no wheezes, rales, or rhonchi. HEART:  Regular rate rhythm, no murmurs, no rubs, no clicks Abd:  soft, positive bowel sounds, no organomegally, no rebound, no guarding Ext:  2 plus pulses, no edema, no cyanosis, no clubbing Skin:  No rashes no nodules Neuro:  CN II through XII intact, motor grossly intact  EKG review of his cardiac monitor demonstrates atrial flutter with a rapid ventricular response. It also demonstrates probable very brief episodes of atrial fibrillation with a more controlled ventricular response. Finally it demonstrates rare atrial tachycardia.  Assess/Plan:

## 2012-12-14 NOTE — Assessment & Plan Note (Signed)
I discussed the treatment options with the patient and his wife in detail. He has an indication for catheter ablation. I cannot guarantee however that he will not have atrial fibrillation or perhaps atrial tachycardia following his procedure. I have offered him initially an option of antiarrhythmic drug therapy with flecainide in conjunction with his beta blocker versus proceeding with catheter ablation, and using antiarrhythmic drug therapy if he has any breakthrough atrial arrhythmias following the ablation procedure. After discussing the pros and cons of the conservative approach versus the ablation approach, the patient wishes to proceed with the ablation approach. We will schedule this at the earliest possible convenience time. I've discussed the risk, goals, benefits, and expectations of the procedure with the patient and his wife and they wish to proceed.

## 2012-12-18 ENCOUNTER — Encounter: Payer: Self-pay | Admitting: *Deleted

## 2012-12-18 ENCOUNTER — Telehealth: Payer: Self-pay | Admitting: Cardiology

## 2012-12-18 ENCOUNTER — Other Ambulatory Visit: Payer: Self-pay | Admitting: *Deleted

## 2012-12-18 NOTE — Telephone Encounter (Signed)
Dr [name] cannot do ablation before 01/05/13. Do you want to go ahead and prescribe flecainide?

## 2012-12-18 NOTE — Telephone Encounter (Signed)
LMTCB

## 2012-12-18 NOTE — Telephone Encounter (Signed)
F/U   Returning call back to nurse.

## 2012-12-18 NOTE — Telephone Encounter (Signed)
New problem:    Would like to discuss  incident on yesterday almost past out.   Sob , no chest pain. Cold sweaty .

## 2012-12-18 NOTE — Telephone Encounter (Signed)
Needs ablation, suspect he had another episode of rapid atrial flutter.  Would see if his ablation could be moved up.  If he has another episode, would try to suppress with flecainide if ablation cannot be moved up.

## 2012-12-18 NOTE — Telephone Encounter (Signed)
Spoke with pt. Pt states during his usual walk yesterday he was very SOB and tired. Later yesterday while  watching football game he became diaphoretic with blurred vision. This lasted about 1 and 1/2 minutes. Pt states he had never had symptoms like this before. He has felt fine since then with no further symptoms. He is scheduled for at flutter ablation 01/05/13 by Dr [name]. I will forward to Dr Aundra Dubin for review and recommendations.

## 2012-12-18 NOTE — Telephone Encounter (Signed)
If he has another symptomatic episode, will start.  Continue Toprol XL for now.

## 2012-12-19 NOTE — Telephone Encounter (Signed)
Pt advised,verbalized understanding. 

## 2012-12-21 ENCOUNTER — Encounter (HOSPITAL_COMMUNITY): Payer: Self-pay | Admitting: Pharmacy Technician

## 2012-12-22 ENCOUNTER — Encounter: Payer: Self-pay | Admitting: Cardiology

## 2013-01-01 ENCOUNTER — Other Ambulatory Visit (INDEPENDENT_AMBULATORY_CARE_PROVIDER_SITE_OTHER): Payer: BC Managed Care – PPO

## 2013-01-01 DIAGNOSIS — I4892 Unspecified atrial flutter: Secondary | ICD-10-CM

## 2013-01-02 LAB — CBC WITH DIFFERENTIAL/PLATELET
Basophils Absolute: 0 10*3/uL (ref 0.0–0.1)
Eosinophils Absolute: 0.1 10*3/uL (ref 0.0–0.7)
Lymphocytes Relative: 23.3 % (ref 12.0–46.0)
MCHC: 33.5 g/dL (ref 30.0–36.0)
Neutro Abs: 3.8 10*3/uL (ref 1.4–7.7)
Neutrophils Relative %: 66.9 % (ref 43.0–77.0)
RDW: 15.1 % — ABNORMAL HIGH (ref 11.5–14.6)

## 2013-01-02 LAB — BASIC METABOLIC PANEL
BUN: 14 mg/dL (ref 6–23)
CO2: 29 mEq/L (ref 19–32)
Calcium: 9.2 mg/dL (ref 8.4–10.5)
Creatinine, Ser: 0.9 mg/dL (ref 0.4–1.5)
Glucose, Bld: 90 mg/dL (ref 70–99)

## 2013-01-05 ENCOUNTER — Ambulatory Visit (HOSPITAL_COMMUNITY)
Admission: RE | Admit: 2013-01-05 | Discharge: 2013-01-05 | Disposition: A | Discharge status: Home / Self Care | Payer: BC Managed Care – PPO | Source: Ambulatory Visit | Attending: Internal Medicine | Admitting: Internal Medicine

## 2013-01-05 ENCOUNTER — Encounter (HOSPITAL_COMMUNITY): Payer: Self-pay | Admitting: General Practice

## 2013-01-05 ENCOUNTER — Encounter (HOSPITAL_COMMUNITY)
Admission: RE | Disposition: A | Discharge status: Home / Self Care | Payer: Self-pay | Source: Ambulatory Visit | Attending: Internal Medicine

## 2013-01-05 DIAGNOSIS — I4892 Unspecified atrial flutter: Secondary | ICD-10-CM

## 2013-01-05 DIAGNOSIS — I10 Essential (primary) hypertension: Secondary | ICD-10-CM | POA: Insufficient documentation

## 2013-01-05 HISTORY — DX: Unspecified osteoarthritis, unspecified site: M19.90

## 2013-01-05 HISTORY — DX: Unspecified atrial flutter: I48.92

## 2013-01-05 HISTORY — DX: Essential (primary) hypertension: I10

## 2013-01-05 HISTORY — PX: ABLATION OF DYSRHYTHMIC FOCUS: SHX254

## 2013-01-05 HISTORY — PX: ATRIAL FLUTTER ABLATION: SHX5733

## 2013-01-05 HISTORY — DX: Adverse effect of unspecified anesthetic, initial encounter: T41.45XA

## 2013-01-05 HISTORY — DX: Other complications of anesthesia, initial encounter: T88.59XA

## 2013-01-05 SURGERY — ATRIAL FLUTTER ABLATION
Anesthesia: LOCAL

## 2013-01-05 MED ORDER — BUPIVACAINE HCL (PF) 0.25 % IJ SOLN
INTRAMUSCULAR | Status: AC
  Filled 2013-01-05: qty 60

## 2013-01-05 MED ORDER — SODIUM CHLORIDE 0.9 % IV SOLN: 250.0000 mL | INTRAVENOUS | Status: DC | PRN

## 2013-01-05 MED ORDER — METOPROLOL SUCCINATE ER 50 MG PO TB24
50.0000 mg | ORAL_TABLET | Freq: Every day | ORAL | Status: DC
  Filled 2013-01-05: qty 1

## 2013-01-05 MED ORDER — METOPROLOL SUCCINATE ER 50 MG PO TB24
50.0000 mg | ORAL_TABLET | Freq: Every day | ORAL | Status: DC
  Administered 2013-01-05: 50 mg via ORAL

## 2013-01-05 MED ORDER — MIDAZOLAM HCL 5 MG/5ML IJ SOLN
INTRAMUSCULAR | Status: AC
  Filled 2013-01-05: qty 5

## 2013-01-05 MED ORDER — SODIUM CHLORIDE 0.9 % IJ SOLN: 3.0000 mL | INTRAMUSCULAR | Status: DC | PRN

## 2013-01-05 MED ORDER — FENTANYL CITRATE 0.05 MG/ML IJ SOLN
INTRAMUSCULAR | Status: AC
  Filled 2013-01-05: qty 2

## 2013-01-05 MED ORDER — FENTANYL CITRATE 0.05 MG/ML IJ SOLN: 25.0000 ug | INTRAMUSCULAR | Status: DC | PRN

## 2013-01-05 MED ORDER — ACETAMINOPHEN 325 MG PO TABS: 650.0000 mg | ORAL_TABLET | ORAL | Status: DC | PRN

## 2013-01-05 MED ORDER — SODIUM CHLORIDE 0.9 % IJ SOLN: 3.0000 mL | Freq: Two times a day (BID) | INTRAMUSCULAR | Status: DC

## 2013-01-05 MED ORDER — APIXABAN 5 MG PO TABS
5.0000 mg | ORAL_TABLET | Freq: Two times a day (BID) | ORAL | Status: DC
Start: 2013-01-05 — End: 2013-01-05
  Administered 2013-01-05: 5 mg via ORAL
  Filled 2013-01-05 (×2): qty 1

## 2013-01-05 MED ORDER — ONDANSETRON HCL 4 MG/2ML IJ SOLN: 4.0000 mg | Freq: Four times a day (QID) | INTRAMUSCULAR | Status: DC | PRN

## 2013-01-05 MED ORDER — LOSARTAN POTASSIUM 50 MG PO TABS
50.0000 mg | ORAL_TABLET | Freq: Every day | ORAL | Status: DC
  Administered 2013-01-05: 50 mg via ORAL
  Filled 2013-01-05: qty 1

## 2013-01-05 NOTE — Progress Notes (Signed)
Utilization Review Completed Dim Meisinger J. Cortez Flippen, RN, BSN, NCM [phone]  

## 2013-01-05 NOTE — Interval H&P Note (Signed)
History and Physical Interval Note:  01/05/2013 8:09 AM  Tsosie Billing  has presented today for surgery, with the diagnosis of AFlutter  The various methods of treatment have been discussed with the patient and family. After consideration of risks, benefits and other options for treatment, the patient has consented to  Procedure(s) (LRB) with comments: ATRIAL FLUTTER ABLATION (N/A) as a surgical intervention .  The patient's history has been reviewed, patient examined, no change in status, stable for surgery.  I have reviewed the patient's chart and labs.  Questions were answered to the patient's satisfaction.     Mikle Bosworth.D.

## 2013-01-05 NOTE — Op Note (Signed)
NAMETOMY, KHIM NO.:  [id number]  MEDICAL RECORD NO.:  [id number]  LOCATION:  [facility]                         FACILITY:  [facility]  PHYSICIAN:  Champ Mungo. [name], MD    DATE OF BIRTH:  1951-10-04  DATE OF PROCEDURE:  01/05/2013 DATE OF DISCHARGE:                              OPERATIVE REPORT   PROCEDURE PERFORMED:  Electrophysiologic study and radiofrequency catheter ablation of atrial flutter.  INTRODUCTION:  The patient is a 62 year old male with a history of recurrent tachy palpitations and documented atrial flutter.  He is here for catheter ablation.  PROCEDURE:  After informed consent was obtained, the patient was taken to the diagnostic EP lab in a fasting state.  After usual preparation and draping, intravenous fentanyl and midazolam were given for sedation. A 6-French hexapolar catheter was inserted percutaneously in the right jugular vein and advanced to the coronary sinus.  A 6-French quadripolar catheter was inserted percutaneously in the right femoral vein and advanced to the His bundle region.  Rapid atrial pacing was then carried out from the atrium at a base drive cycle length of 500 milliseconds. The S1-S2 interval was stepwise decreased down to 240 milliseconds where atrial refractoriness was observed.  During programmed atrial stimulation, there were no inducible SVTs.  Next, rapid atrial pacing was carried out from the atrium at a base drive cycle length of 590 milliseconds and stepwise decreased down to 350 milliseconds where AV Wenckebach was observed.  Additional decrements down to 210 milliseconds resulted in the initiation of atrial flutter.  The flutter was typical and counterclockwise flutter.  It would terminate spontaneously. Typically when the flutter terminated, it would degenerate into AFib and then stop.  At this point, the 7-French quadripolar ablation catheter was inserted into the right femoral vein and advanced into the  atrial flutter isthmus.  Pacing was carried out in the coronary sinus and during pacing RF energy was applied to the atrial flutter isthmus.  This resulted in the creation of block in the atrial flutter isthmus.  A total of 7 RFs were delivered to the atrial flutter isthmus.  The patient was observed for 30 minutes.  During this time, additional rapid ventricular pacing was carried out from the right ventricle at a base drive cycle length of 600 milliseconds demonstrating VA dissociation. Programmed ventricular stimulation was carried out from the right ventricle also demonstrating VA dissociation at 600 milliseconds.  At this point, the catheters were removed.  Hemostasis was assured and the patient was returned to his room in satisfactory condition.  COMPLICATIONS:  There were no immediate procedure complications.  RESULTS:  A.  Baseline ECG.  Baseline ECG demonstrates sinus rhythm with normal axis intervals. B.  Baseline intervals where sinus node cycle length was 900 milliseconds.  The HV interval was 41 millisecond, the AH interval was 90 milliseconds.  QRS duration was 110 milliseconds. C.  Rapid ventricular pacing.  Rapid ventricular pacing following ablation demonstrated VA dissociation at 600 milliseconds. D.  Programmed ventricular stimulation.  Programmed ventricular stimulation was carried out at the right ventricle at a base drive cycle length of 600 milliseconds demonstrating VA dissociation. E.  Rapid atrial pacing.  Rapid atrial pacing  was carried out from the atrium at a base drive cycle length of 600 milliseconds and stepwise decreased down to 200 milliseconds resulting in the initiation of atrial flutter.  During rapid atrial pacing the PR interval was less than the RR interval and AV Wenckebach was noted at 350 milliseconds. F.  Programmed atrial stimulation.  Programmed atrial stimulation was carried out from the atrium at a base drive cycle length of  500 milliseconds.  The S1-S2 interval was stepwise decreased from 440 milliseconds down to 240 milliseconds where atrial refractoriness was observed.  During programmed atrial stimulation, there were no AH jumps, no echo beats, no inducible SVT. G.  Arrhythmias observed. 1. Atrial flutter initiation was with rapid atrial pacing.  The     duration was nonsustained, termination was spontaneous.     a.     Mapping.  Mapping the atrial flutter isthmus demonstrated      usual size and orientation.     b.     RF energy application.  A total of 7 RF energy applications      were delivered to the atrial flutter isthmus resulting in the      creation of isthmus block.  CONCLUSION:  Study demonstrates successful electrophysiologic study and RF catheter ablation of typical atrial flutter with a total of 7 RF energy applications delivered to the atrial flutter isthmus.  Following ablation, there was no inducible flutter.     Champ Mungo. [name], MD     GWT/MEDQ  D:  01/05/2013  T:  01/05/2013  Job:  [id number]

## 2013-01-05 NOTE — H&P (View-Only) (Signed)
HPI Lee Lee is referred today by Dr. Marigene Ehlers for evaluation of atrial flutter. The patient has a history of hypertension. He also is a history of kidney stones. His health is otherwise been good. Several months ago he began to experience palpitations. Subsequent evaluation demonstrated periods of atrial flutter with rapid ventricular response. Additional evaluation demonstrated brief episodes of atrial fibrillation as well as probable atrial tachycardia. The patient cannot distinguish between his arrhythmias but notices that his heart race at times for several minutes to over an hour. He was placed on anticoagulation. He was placed on escalating doses of beta blocker. Since increasing his metoprolol XL to 50 mg daily, his symptoms of palpitations have improved though he has noted increasing fatigue and weakness. He desires not to be on more medical therapy than absolute necessary. No Known Allergies   Current Outpatient Prescriptions  Medication Sig Dispense Refill  . apixaban (ELIQUIS) 5 MG TABS tablet Take 1 tablet (5 mg total) by mouth 2 (two) times daily.  60 tablet  6  . losartan (COZAAR) 50 MG tablet Take 1 tablet (50 mg total) by mouth daily.  30 tablet  3  . metoprolol succinate (TOPROL XL) 50 MG 24 hr tablet Take 1 tablet (50 mg total) by mouth daily. Take with or immediately following a meal.  30 tablet  6  . tadalafil (CIALIS) 20 MG tablet Take 20 mg by mouth daily as needed. For ED         Past Medical History  Diagnosis Date  . Calcium oxalate renal stones   . Hemorrhoids   . ED (erectile dysfunction)     ROS:   All systems reviewed and negative except as noted in the HPI.   Past Surgical History  Procedure Date  . Colonoscopy 11/2009    Dr. Benson Norway     No family history on file.   History   Social History  . Marital Status: Married    Spouse Name: N/A    Number of Children: N/A  . Years of Education: N/A   Occupational History  . Not on file.   Social  History Main Topics  . Smoking status: Never Smoker   . Smokeless tobacco: Not on file  . Alcohol Use: Yes  . Drug Use: No  . Sexually Active:    Other Topics Concern  . Not on file   Social History Narrative  . No narrative on file     BP 155/83  Pulse 58  Ht 6' (1.829 m)  Wt 241 lb 1.9 oz (109.371 kg)  BMI 32.70 kg/m2  Physical Exam:  Well appearing middle-aged man, NAD HEENT: Unremarkable Neck:  6 cm JVD, no thyromegally Lungs:  Clear with no wheezes, rales, or rhonchi. HEART:  Regular rate rhythm, no murmurs, no rubs, no clicks Abd:  soft, positive bowel sounds, no organomegally, no rebound, no guarding Ext:  2 plus pulses, no edema, no cyanosis, no clubbing Skin:  No rashes no nodules Neuro:  CN II through XII intact, motor grossly intact  EKG review of his cardiac monitor demonstrates atrial flutter with a rapid ventricular response. It also demonstrates probable very brief episodes of atrial fibrillation with a more controlled ventricular response. Finally it demonstrates rare atrial tachycardia.  Assess/Plan:

## 2013-01-05 NOTE — Op Note (Signed)
EPS/RFA of atrial flutter without immediate complication. X#[id number]

## 2013-01-10 ENCOUNTER — Ambulatory Visit: Payer: PRIVATE HEALTH INSURANCE | Admitting: Cardiology

## 2013-01-13 ENCOUNTER — Other Ambulatory Visit: Payer: Self-pay

## 2013-01-28 ENCOUNTER — Other Ambulatory Visit: Payer: Self-pay | Admitting: Family Medicine

## 2013-01-29 NOTE — Telephone Encounter (Signed)
IS THIS OK

## 2013-01-31 ENCOUNTER — Encounter: Payer: Self-pay | Admitting: Cardiology

## 2013-01-31 ENCOUNTER — Ambulatory Visit (INDEPENDENT_AMBULATORY_CARE_PROVIDER_SITE_OTHER): Payer: BC Managed Care – PPO | Admitting: Cardiology

## 2013-01-31 VITALS — BP 144/86 | HR 60 | Resp 20 | Ht 72.0 in | Wt 238.8 lb

## 2013-01-31 DIAGNOSIS — I712 Thoracic aortic aneurysm, without rupture, unspecified: Secondary | ICD-10-CM

## 2013-01-31 DIAGNOSIS — R0609 Other forms of dyspnea: Secondary | ICD-10-CM

## 2013-01-31 DIAGNOSIS — R002 Palpitations: Secondary | ICD-10-CM

## 2013-01-31 DIAGNOSIS — R0683 Snoring: Secondary | ICD-10-CM

## 2013-01-31 DIAGNOSIS — I7121 Aneurysm of the ascending aorta, without rupture: Secondary | ICD-10-CM

## 2013-01-31 DIAGNOSIS — R0989 Other specified symptoms and signs involving the circulatory and respiratory systems: Secondary | ICD-10-CM

## 2013-01-31 DIAGNOSIS — I4892 Unspecified atrial flutter: Secondary | ICD-10-CM

## 2013-01-31 DIAGNOSIS — I10 Essential (primary) hypertension: Secondary | ICD-10-CM

## 2013-01-31 MED ORDER — METOPROLOL SUCCINATE ER 25 MG PO TB24: 25.0000 mg | ORAL_TABLET | Freq: Every day | ORAL | Status: DC

## 2013-01-31 MED ORDER — LOSARTAN POTASSIUM 50 MG PO TABS: 75.0000 mg | ORAL_TABLET | Freq: Every day | ORAL | Status: DC

## 2013-01-31 NOTE — Progress Notes (Signed)
Patient ID: Lee Lee, male   DOB: [birth year], 62 y.o.   MRN: [id number] PCP: Dr. Redmond School  62 yo with history of atrial flutter and HTN returns for cardiology evaluation.  For the last couple of years, patient has had episodes of tachypalpitations.  There episodes have been occurring more frequently, especially over the last couple of months. They will last for 30-60 minutes at a time and have no particular trigger.  No lightheadedness or syncope.  In 10/13, he had tachypalpitations while playing golf with associated chest pressure.  He went to the ER.  While there, he had a coronary CT angiogram with no significant CAD.  Echo showed normal EF but the ascending aorta was dilated to 4.2 cm.    Mr Lee Lee wore an event monitor for 3 wks.  This showed paroxysmal atrial flutter with HR to as high as the 150s as well as occasional events that may have been atrial fibrillation.  He saw Dr. [name] and had successful atrial flutter ablation in 2/14.  Since the ablation, his symptoms have considerably subsided.  He had one episode of palpitations last weekend while playing golf.  He took his pulse and it was regular with HR in the 90s (usually in the 60s).  Symptoms lasted 1/2 hour.  He felt anxious at the time.  No exertional dyspnea or chest pain.  Patient does report snoring and sleepiness in the late afternoon.  BP is mildly elevated today.  He has been losing weight.   Labs (11/13): TSH normal, LDL 89, HDL 56 Labs (12/13): K 4, creatinine 0.8 Labs (2/14): K 4.2, creatinine 0.9  ECG: NSR at 51, iRBBB  PMH: 1. HTN 2. Erectile dysfunction 3. Palpitations: Echo (10/13) with EF 55-60%, mild LVH, trileaflet aortic valve with 4.1 cm aortic root and 4.2 cm ascending aorta.  4. Ascending aortic aneurysm: 4.2 cm on echo and CT in 10/13.  Aortic valve is trileaflet.  Suspect this is due to HTN.  5. Atypical chest pain: Coronary CT angiogram in 10/13 with coronary calcium score 0 and no significant CAD noted  in the coronaries.  6. Paroxysmal atrial flutter: First noted on 3 week event monitor in 12/13. Atrial flutter ablation in 2/14.   SH: Nonsmoker, married, works in Multimedia programmer.   FH: Mother had valvular heart disease, sounds like AI.   ROS: All systems reviewed and negative except as per HPI.   Current Outpatient Prescriptions  Medication Sig Dispense Refill  . apixaban (ELIQUIS) 5 MG TABS tablet Take 5 mg by mouth 2 (two) times daily.      Marland Kitchen losartan (COZAAR) 50 MG tablet Take 50 mg by mouth daily.      . metoprolol succinate (TOPROL-XL) 50 MG 24 hr tablet Take 50 mg by mouth daily. Take with or immediately following a meal.      . tadalafil (CIALIS) 20 MG tablet Take 20 mg by mouth daily as needed. For ED      . CIALIS 20 MG tablet TAKE 1 TABLET (20 MG TOTAL) BY MOUTH DAILY AS NEEDED FOR ERECTILE DYSFUNCTION  6 tablet  10   No current facility-administered medications for this visit.    BP 144/86  Pulse 60  Resp 20  Ht 6' (1.829 m)  Wt 238 lb 12.8 oz (108.319 kg)  BMI 32.38 kg/m2 General: NAD Neck: No JVD, no thyromegaly or thyroid nodule.  Lungs: Clear to auscultation bilaterally with normal respiratory effort. CV: Nondisplaced PMI.  Heart regular S1/S2, +S4, no murmur.  No peripheral edema.  No carotid bruit.  Normal pedal pulses.  Abdomen: Soft, nontender, no hepatosplenomegaly, no distention.  Neurologic: Alert and oriented x 3.  Psych: Normal affect. Extremities: No clubbing or cyanosis.   Assessment/Plan: 1. Paroxysmal atrial flutter: Status post atrial flutter ablation.  Symptoms have almost resolved since procedure.  He had one episode of palpitations last weekend while playing golf.  He took his pulse and HR was in the 90s (usually in 60s) and regular.  It is possible that this may have been sinus rhythm elevated from his baseline in the setting of anxiety.  - If he has further palpitations, he will call our office and I will arrange for an event monitor.  He did  have some possible atrial fibrillation on the monitor though predominant arrhythmia was atrial flutter.  - Continue apixaban for now.  He will see Dr. [name] in May.  If he is not having palpitations/symptoms concerning for atrial fibrillation, can stop apixaban at that time.  - If he is noted in the interim to have PAF, will need discussion regarding +/- continuation of anticoagulation.  CHADSVASC = 1 for HTN.   - Decrease Toprol XL to 25 mg daily now that he has had flutter ablation given more fatigue since it was increased to 50 mg daily. - Continue efforts at weight loss to decrease risk of future atrial arrhythmias.   - Continue BP control. - Given symptoms concerning for OSA, will arrange for sleep study given increased risk of atrial fibrillation with untreated OSA.  2. HTN:  Increase losartan to 75 mg daily as we are decreasing the Toprol.  He will have his BP checked at work and will call if BP runs consistently > 140/90.  3. Ascending aortic aneurysm: Mild, 4.2 cm.  Trileaflet aortic valve.  I suspect this is due to hypertension (no evidence for collagen vascular disease).  Patient is on an ARB (ARBs seem to decrease progression of ascending aorta dilation in patients with collagen vascular disease).  I will get an MRA chest to follow thoracic aorta dimensions in 10/14.  If this is stable, can follow at 2-3 year intervals.   [name] 01/31/2013

## 2013-01-31 NOTE — Patient Instructions (Addendum)
Your physician has recommended you make the following change in your medication: INCREASE  your Losartan to 75 mg daily, DECREASE your Toprol to 25 mg daily  Your physician has requested that you have a chest  MRA in October a week or so prior to your next f/u visit.  Your physician has recommended that you have a sleep study. This test records several body functions during sleep, including: brain activity, eye movement, oxygen and carbon dioxide blood levels, heart rate and rhythm, breathing rate and rhythm, the flow of air through your mouth and nose, snoring, body muscle movements, and chest and belly movement.  Your physician wants you to follow-up in: October a week or two after your MRA.   You will receive a reminder letter in the mail two months in advance. If you don't receive a letter, please call our office to schedule the follow-up appointment.  Have the nurse at work check your blood pressure periodically and call Dr Claris Gladden nurse, Webb Silversmith, at [phone], if your bp is over 140/90 consistently  If your palpitations increase, call Webb Silversmith, Dr Claris Gladden nurse at [phone], to order an event monitor.

## 2013-02-06 ENCOUNTER — Telehealth: Payer: Self-pay | Admitting: Internal Medicine

## 2013-02-06 ENCOUNTER — Encounter: Payer: Self-pay | Admitting: Cardiology

## 2013-02-06 NOTE — Telephone Encounter (Signed)
Dr Claris Gladden patient and it doesn't look like results are back  I will forward to [facility].  I have left patient a message for patient that the results are not ready and usually take 10-14 days

## 2013-02-06 NOTE — Telephone Encounter (Signed)
They would like results of echo

## 2013-02-07 NOTE — Telephone Encounter (Signed)
Pt states he did not call for any test results yesterday. He does have a sleep study scheduled for 02/19/12 and is aware I will call him after results are final.

## 2013-02-07 NOTE — Telephone Encounter (Signed)
Follow up call    Returning call back to [facility] on yesterday.

## 2013-02-07 NOTE — Telephone Encounter (Signed)
LMTCB

## 2013-02-07 NOTE — Telephone Encounter (Signed)
I spoke with pt  

## 2013-02-18 ENCOUNTER — Ambulatory Visit (HOSPITAL_BASED_OUTPATIENT_CLINIC_OR_DEPARTMENT_OTHER): Payer: BC Managed Care – PPO | Attending: Cardiology

## 2013-02-18 DIAGNOSIS — R0683 Snoring: Secondary | ICD-10-CM

## 2013-02-18 DIAGNOSIS — Z9989 Dependence on other enabling machines and devices: Secondary | ICD-10-CM

## 2013-02-18 DIAGNOSIS — G4733 Obstructive sleep apnea (adult) (pediatric): Secondary | ICD-10-CM

## 2013-03-01 DIAGNOSIS — G4730 Sleep apnea, unspecified: Secondary | ICD-10-CM

## 2013-03-01 DIAGNOSIS — G4710 Hypersomnia, unspecified: Secondary | ICD-10-CM

## 2013-03-02 NOTE — Procedures (Signed)
[name], Lee Lee             ACCOUNT NO.:  [id number]  MEDICAL RECORD NO.:  [id number]          PATIENT TYPE:  OUT  LOCATION:  SLEEP CENTER                 FACILITY:  [facility]  PHYSICIAN:  Kathee Delton, MD,FCCPDATE OF BIRTH:  July 16[birth year] 1953  DATE OF STUDY:  02/18/2013                           NOCTURNAL POLYSOMNOGRAM  REFERRING PHYSICIAN:  [name], MD  LOCATION:  Sleep Lab.  REFERRING PHYSICIAN:  [name], MD  INDICATION FOR STUDY:  Hypersomnia with sleep apnea.  EPWORTH SLEEPINESS SCORE:  4.  SLEEP ARCHITECTURE:  The patient had a total sleep time of 370 minutes with no slow-wave sleep and decreased quantity of REM.  Sleep onset latency was normal at 13 minutes, and REM onset was at the upper limits of normal at 125 minutes.  Sleep efficiency was normal at 91% during the diagnostic portion of the study and decreased to 86% during the titration portion of the study.  RESPIRATORY DATA:  The patient underwent a split night study, where he was found to have 61 obstructive and central events in the first 192 minutes of sleep.  This gave him an apnea/hypopnea index with 19 events per hour during the diagnostic portion of the study.  The events occurred in all body positions, and there was loud snoring noted throughout.  By protocol, he was fitted with a medium ResMed Quattro Air full-face mask, and CPAP titration was initiated.  He was found to have good control of his events as well as snoring with a CPAP pressure of 8 cm of water, including during supine REM.  OXYGEN DATA:  There was O2 desaturation as low as 72% with the patient's obstructive events.  CARDIAC DATA:  No clinically significant arrhythmias were noted.  MOVEMENT/PARASOMNIA:  The patient was found to have 99 periodic limb movements with small numbers of arousal or awakening.  There were no behavioral abnormalities noted.  IMPRESSION/RECOMMENDATION:  Split night study reveals mild to  moderate obstructive sleep apnea, with an AHI of 19 events per hour and oxygen desaturation as low as 72% during the diagnostic portion of the study. He was then fitted with a medium ResMed [employer] full face mask, and found to have an optimal pressure of 8 cm of water.  He should also be encouraged to work aggressively on weight loss.     Kathee Delton, MD,FCCP [place], [place] Board of Sleep Medicine    [facility]/MED[facility]  D:  03/01/2013 08:55:00  T:  03/02/2013 00:57:12  Job:  [id number]

## 2013-03-05 ENCOUNTER — Telehealth: Payer: Self-pay

## 2013-03-05 NOTE — Telephone Encounter (Signed)
Called pt to see if he knew results of sleep study if not to please make apt with Dr.lalonde to discuss results

## 2013-03-08 ENCOUNTER — Encounter: Payer: Self-pay | Admitting: Family Medicine

## 2013-03-08 ENCOUNTER — Ambulatory Visit (INDEPENDENT_AMBULATORY_CARE_PROVIDER_SITE_OTHER): Payer: BC Managed Care – PPO | Admitting: Family Medicine

## 2013-03-08 VITALS — Wt 231.0 lb

## 2013-03-08 DIAGNOSIS — G4733 Obstructive sleep apnea (adult) (pediatric): Secondary | ICD-10-CM

## 2013-03-08 NOTE — Patient Instructions (Addendum)
Continue to work on the exercise increasing to 150 minutes per week. A goal waist size of 34-36 has been set.

## 2013-03-08 NOTE — Progress Notes (Signed)
  Subjective:    Patient ID: Lee Lee, male    DOB: Apr 28, 1951, 62 y.o.   MRN: [id number]  HPI He is here for consultation. He recently had a sleep study which did show an AHI of 19 and desaturation down to 72%. He was a split night study and he did respond 8 cm of water however he did have difficulty with the mask. He also is involved in a weight loss program and so far has lost approximately 15 pounds. He is walking approximately twice per week and has made some dietary changes.   Review of Systems     Objective:   Physical Exam Alert and in no distress otherwise not examined       Assessment & Plan:  Obstructive sleep apnea I discussed the diagnosis of sleep apnea and the ramifications of that in regard to sleep issues, fatigue and stamina, heart related problems. Recommend he continue with his exercise program. Discussed increasing this to 150 minutes per week and doing it in divided times not necessarily half hour to 45 minutes at a time. Also discussed cutting back on carbohydrates. He has had a goal waist size of 34. I plan to recheck this in 3 months. I will also set him up for CPAP supplies. I discussed the fact that with weight loss, we can reevaluate the sleep apnea and there is a good chance that the problem will go away.

## 2013-04-05 ENCOUNTER — Encounter: Payer: Self-pay | Admitting: Internal Medicine

## 2013-04-05 ENCOUNTER — Ambulatory Visit (INDEPENDENT_AMBULATORY_CARE_PROVIDER_SITE_OTHER): Payer: BC Managed Care – PPO | Admitting: Internal Medicine

## 2013-04-05 VITALS — BP 148/89 | HR 67 | Ht 72.0 in | Wt 231.4 lb

## 2013-04-05 DIAGNOSIS — R002 Palpitations: Secondary | ICD-10-CM

## 2013-04-05 DIAGNOSIS — I4892 Unspecified atrial flutter: Secondary | ICD-10-CM

## 2013-04-05 DIAGNOSIS — I7121 Aneurysm of the ascending aorta, without rupture: Secondary | ICD-10-CM

## 2013-04-05 DIAGNOSIS — I712 Thoracic aortic aneurysm, without rupture, unspecified: Secondary | ICD-10-CM

## 2013-04-05 DIAGNOSIS — I10 Essential (primary) hypertension: Secondary | ICD-10-CM

## 2013-04-05 NOTE — Progress Notes (Signed)
HPI Mr. Sidle returns today for followup. He is a very pleasant 62 year old man with a history of atrial flutter, status post catheter ablation. He has had no recurrent palpitations or documented atrial arrhythmias. He denies chest pain or shortness of breath. He does admit to dietary indiscretion with alcohol. No Known Allergies   Current Outpatient Prescriptions  Medication Sig Dispense Refill  . apixaban (ELIQUIS) 5 MG TABS tablet Take 5 mg by mouth daily.       Marland Kitchen CIALIS 20 MG tablet TAKE 1 TABLET (20 MG TOTAL) BY MOUTH DAILY AS NEEDED FOR ERECTILE DYSFUNCTION  6 tablet  10  . losartan (COZAAR) 50 MG tablet Take 1.5 tablets (75 mg total) by mouth daily.  45 tablet  9  . metoprolol succinate (TOPROL-XL) 25 MG 24 hr tablet Take 1 tablet (25 mg total) by mouth daily. Take with or immediately following a meal.  30 tablet  9  . tadalafil (CIALIS) 20 MG tablet Take 20 mg by mouth daily as needed. For ED       No current facility-administered medications for this visit.     Past Medical History  Diagnosis Date  . Calcium oxalate renal stones   . Hemorrhoids   . ED (erectile dysfunction)   . Complication of anesthesia     makes him loopy  . Hypertension   . Atrial flutter     s/p RFCA 01/05/13  . Arthritis     back & knees    ROS:   All systems reviewed and negative except as noted in the HPI.   Past Surgical History  Procedure Laterality Date  . Colonoscopy  11/2009    Dr. Benson Norway  . Arthroscopic repair acl    . Rotator cuff repair    . Ablation of dysrhythmic focus  01/05/2013     No family history on file.   History   Social History  . Marital Status: Married    Spouse Name: N/A    Number of Children: N/A  . Years of Education: N/A   Occupational History  . Not on file.   Social History Main Topics  . Smoking status: Former Smoker    Quit date: 01/06/1988  . Smokeless tobacco: Former Systems developer    Quit date: 03/05/2012  . Alcohol Use: Yes     Comment: wine daily   . Drug Use: No  . Sexually Active:    Other Topics Concern  . Not on file   Social History Narrative  . No narrative on file     BP 148/89  Pulse 67  Ht 6' (1.829 m)  Wt 231 lb 6.4 oz (104.962 kg)  BMI 31.38 kg/m2  Physical Exam:  Well appearing middle-aged man,NAD HEENT: Unremarkable Neck:  No JVD, no thyromegally Back:  No CVA tenderness Lungs:  Clear with no wheezes, rales, or rhonchi. HEART:  Regular rate rhythm, no murmurs, no rubs, no clicks Abd:  soft, positive bowel sounds, no organomegally, no rebound, no guarding Ext:  2 plus pulses, no edema, no cyanosis, no clubbing Skin:  No rashes no nodules Neuro:  CN II through XII intact, motor grossly intact  EKG - normal sinus rhythm with incomplete right bundle branch block.  Assess/Plan:

## 2013-04-05 NOTE — Patient Instructions (Signed)
Your physician recommends that you schedule a follow-up appointment as needed  

## 2013-04-05 NOTE — Assessment & Plan Note (Signed)
He has had no recurrent atrial arrhythmias. He will stop his anticoagulation today. He is instructed to call us if he has recurrent symptomatic palpitations.

## 2013-04-05 NOTE — Assessment & Plan Note (Signed)
His blood pressure is slightly elevated today. He may ultimately require more antihypertensive medications. For now, he is instructed to reduce his salt intake, and increase his physical activity.

## 2013-05-09 ENCOUNTER — Telehealth: Payer: Self-pay | Admitting: Cardiology

## 2013-05-09 DIAGNOSIS — I4892 Unspecified atrial flutter: Secondary | ICD-10-CM

## 2013-05-09 NOTE — Telephone Encounter (Signed)
We can have him wear a 2-week monitor to see if we identify anything concerning.

## 2013-05-09 NOTE — Telephone Encounter (Signed)
Spoke with patient. Pt states he has 2 episodes of fluttering in the last 2 weeks. They lasted about 30 seconds each and seemed to take his breath away. Pt states otherwise he feels OK. I will forward to Dr Aundra Dubin for review/

## 2013-05-09 NOTE — Telephone Encounter (Signed)
New problem  Pt states that he is still having flutters even after the ablation. I offered him the first available appt with Dr Aundra Dubin which is in July and he declined. He asked if you could give him a call back.

## 2013-05-10 NOTE — Telephone Encounter (Signed)
Spoke with patient. He agreed with 2 week monitor. Pt will be unavailable today, will ask [facility] to call tomorrow to arrange 2 week monitor.

## 2013-05-18 ENCOUNTER — Ambulatory Visit (INDEPENDENT_AMBULATORY_CARE_PROVIDER_SITE_OTHER): Payer: BC Managed Care – PPO

## 2013-05-18 DIAGNOSIS — I4892 Unspecified atrial flutter: Secondary | ICD-10-CM

## 2013-05-18 NOTE — Progress Notes (Signed)
Placed a 14 day ecardio montior on patient

## 2013-06-07 ENCOUNTER — Encounter: Payer: Self-pay | Admitting: Family Medicine

## 2013-06-07 ENCOUNTER — Ambulatory Visit (INDEPENDENT_AMBULATORY_CARE_PROVIDER_SITE_OTHER): Payer: BC Managed Care – PPO | Admitting: Family Medicine

## 2013-06-07 VITALS — BP 124/84 | HR 71 | Wt 225.0 lb

## 2013-06-07 DIAGNOSIS — G4733 Obstructive sleep apnea (adult) (pediatric): Secondary | ICD-10-CM

## 2013-06-07 DIAGNOSIS — I4892 Unspecified atrial flutter: Secondary | ICD-10-CM

## 2013-06-07 NOTE — Progress Notes (Signed)
  Subjective:    Patient ID: Lee Lee, male    DOB: 08-17-1951, 62 y.o.   MRN: [id number]  HPI Is here for recheck. His record indicates he's lost well but over 20 pounds. He has cut back on carbohydrates as well as wine consumption. He is walking for roughly 45 minutes daily as well as walking on the golf course. He has not used his CPAP for 2 months. He states that he is sleeping much better and has much more energy. He also has a history of atrial flutter and did have an ablation. He has had at least one episode of breakthrough and did followup with his cardiologist concerning this.   Review of Systems     Objective:   Physical Exam Alert and in no distress otherwise not examined       Assessment & Plan:  Atrial flutter  Obstructive sleep apnea  will continue to work on weight loss and apparently wants to get down to around 210 pounds before a [place] cruise over the Christmas holidays. I discussed the fact that we can potentially get him off of some of his medications his he continues to lose weight and that we can retest him for the sleep apnea but would prefer more weight reduction. Recheck here in 3 months.

## 2013-06-13 ENCOUNTER — Telehealth: Payer: Self-pay | Admitting: *Deleted

## 2013-06-13 NOTE — Telephone Encounter (Signed)
I have made patient an appt with Dr [name] 07/04/13 at 2:15PM, pt is unaware this appt has been made for him. LMTCB for pt again.

## 2013-06-13 NOTE — Telephone Encounter (Signed)
Dr Aundra Dubin reviewed monitor done 05/18/13-05/31/13.   Episode of regular SVT @ 150 bpm, suspect atrial flutter.   Will send back to Dr [name].  Continue Toprol XL.  Would restart Apixaban for now and stop aspirin.   06/13/13 LMTCB for patient.

## 2013-06-13 NOTE — Telephone Encounter (Signed)
Voice mail

## 2013-06-15 NOTE — Telephone Encounter (Signed)
Voice mail

## 2013-06-15 NOTE — Telephone Encounter (Signed)
LMTCB

## 2013-06-19 NOTE — Telephone Encounter (Signed)
LMTCB for pt and for pt's wife on mobile # listed for her.

## 2013-06-20 NOTE — Telephone Encounter (Signed)
LMTCB at work #

## 2013-06-22 ENCOUNTER — Telehealth: Payer: Self-pay | Admitting: Internal Medicine

## 2013-06-22 ENCOUNTER — Encounter: Payer: Self-pay | Admitting: *Deleted

## 2013-06-22 NOTE — Telephone Encounter (Signed)
Will cancel 07/04/13 appt with Dr [name] at pt's request

## 2013-06-22 NOTE — Telephone Encounter (Signed)
Pt advised, verbalized understanding. He is not currently taking aspirin. He will states he has refills of Apixaban and will restart Apixaban.Pt states he cannot make 07/04/13 appt with Dr [name]. He declined to reschedule appt today and said he would call back to schedule when he returns from vacation.

## 2013-06-22 NOTE — Telephone Encounter (Signed)
See phone note 06/13/13

## 2013-06-22 NOTE — Telephone Encounter (Signed)
LMTCB at work and phone #s. LMTCB for wife at # listed. Mailed letter to pt.

## 2013-06-22 NOTE — Telephone Encounter (Signed)
Pt rtn call to anne, pls call

## 2013-06-22 NOTE — Telephone Encounter (Signed)
Spoke with patient.

## 2013-07-04 ENCOUNTER — Ambulatory Visit: Payer: BC Managed Care – PPO | Admitting: Internal Medicine

## 2013-08-07 ENCOUNTER — Telehealth: Payer: Self-pay | Admitting: Cardiology

## 2013-08-07 DIAGNOSIS — I712 Thoracic aortic aneurysm, without rupture, unspecified: Secondary | ICD-10-CM

## 2013-08-07 NOTE — Telephone Encounter (Signed)
Pt states he is claustrophobic and even taking Valium 3m before the scan he does not feel he can complete MRA scheduled for October. I will forward to Dr MAundra Dubin

## 2013-08-07 NOTE — Telephone Encounter (Signed)
We can do a CTA chest to assess the ascending aorta instead.  He has had that before.

## 2013-08-07 NOTE — Telephone Encounter (Signed)
Follow up  ° ° ° °Returning call back to nurse  °

## 2013-08-07 NOTE — Telephone Encounter (Signed)
Spoke with patient.

## 2013-08-10 NOTE — Telephone Encounter (Signed)
Pt advised can have CTA chest instead of MRA chest , will send message to [facility] to schedule.

## 2013-08-30 ENCOUNTER — Ambulatory Visit (INDEPENDENT_AMBULATORY_CARE_PROVIDER_SITE_OTHER)
Admission: RE | Admit: 2013-08-30 | Discharge: 2013-08-30 | Disposition: A | Discharge status: Home / Self Care | Payer: BC Managed Care – PPO | Source: Ambulatory Visit | Attending: Cardiology | Admitting: Cardiology

## 2013-08-30 ENCOUNTER — Other Ambulatory Visit: Payer: Self-pay | Admitting: Cardiology

## 2013-08-30 ENCOUNTER — Telehealth: Payer: Self-pay | Admitting: Cardiology

## 2013-08-30 DIAGNOSIS — I712 Thoracic aortic aneurysm, without rupture, unspecified: Secondary | ICD-10-CM

## 2013-08-30 MED ORDER — IOHEXOL 350 MG/ML SOLN
100.0000 mL | Freq: Once | INTRAVENOUS | Status: AC | PRN
  Administered 2013-08-30: 100 mL via INTRAVENOUS

## 2013-08-30 NOTE — Telephone Encounter (Signed)
Follow Up  Pt returning a call. Not sure of the nature of the call.

## 2013-08-30 NOTE — Telephone Encounter (Signed)
Spoke with patient about recent CT results

## 2013-09-04 ENCOUNTER — Ambulatory Visit (HOSPITAL_COMMUNITY): Admission: RE | Admit: 2013-09-04 | Payer: BC Managed Care – PPO | Source: Ambulatory Visit

## 2013-09-17 ENCOUNTER — Ambulatory Visit: Payer: BC Managed Care – PPO | Admitting: Family Medicine

## 2013-09-18 ENCOUNTER — Ambulatory Visit: Payer: BC Managed Care – PPO | Admitting: Cardiology

## 2013-10-04 ENCOUNTER — Other Ambulatory Visit: Payer: Self-pay

## 2013-10-17 ENCOUNTER — Encounter: Payer: Self-pay | Admitting: Family Medicine

## 2013-10-17 ENCOUNTER — Ambulatory Visit (INDEPENDENT_AMBULATORY_CARE_PROVIDER_SITE_OTHER): Payer: BC Managed Care – PPO | Admitting: Family Medicine

## 2013-10-17 VITALS — BP 120/82 | HR 86 | Ht 71.0 in | Wt 225.0 lb

## 2013-10-17 DIAGNOSIS — I4892 Unspecified atrial flutter: Secondary | ICD-10-CM

## 2013-10-17 DIAGNOSIS — Z719 Counseling, unspecified: Secondary | ICD-10-CM

## 2013-10-17 DIAGNOSIS — F410 Panic disorder [episodic paroxysmal anxiety] without agoraphobia: Secondary | ICD-10-CM

## 2013-10-17 DIAGNOSIS — Z7189 Other specified counseling: Secondary | ICD-10-CM

## 2013-10-17 NOTE — Patient Instructions (Signed)
Call Darryl Hyers [phone]

## 2013-10-17 NOTE — Progress Notes (Signed)
  Subjective:    Patient ID: Lee Lee, male    DOB: Oct 17, 1951, 62 y.o.   MRN: [id number]  HPI He is here for consultation concerning difficulty with anxiety. In the past he had no difficulties dealing with this and in fact enjoyed the challenge that situations would bring. Approximately 6 months ago he was diagnosed with atrial flutter. He has been involved with cardiology for this. Also prior to this his father died. 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 He also in the next several years will be considering retirement. He notes difficulty now with anxiety dealing with flying and doing presentations. He is also noted difficulty with cough especially during the first several holes. These problems have never occurred in the past. He did state that prior to this he was on no medications and now he is taking heart medications as well as blood thinners. He is not happy with this whole situation.  Review of Systems     Objective:   Physical Exam Alert and in no distress with appropriate affect. otherwise not examined       Assessment & Plan:  Panic attacks  Bereavement counseling  Atrial flutter  I discussed panic attacks as well as bereavement with him. I think the recent difficulty he is dealing with acid do with all the stresses that have occurred in his life. He discussed the fact that he needs to get involved in counseling to help deal with the anxiety and panicky situations. Explained that it is not unusual to go through the life changing experience is that he has had especially dealing with his own mortality and the death of his father as well as impending retirement. I will give him a small prescription for Xanax. Explained that the medication would help with his symptoms over counseling would help with the cause of his symptoms. He did seem to understand this.

## 2013-11-15 ENCOUNTER — Encounter: Payer: Self-pay | Admitting: Cardiology

## 2013-11-15 ENCOUNTER — Ambulatory Visit (INDEPENDENT_AMBULATORY_CARE_PROVIDER_SITE_OTHER): Payer: BC Managed Care – PPO | Admitting: Cardiology

## 2013-11-15 VITALS — BP 156/92 | HR 62 | Ht 72.0 in | Wt 239.0 lb

## 2013-11-15 DIAGNOSIS — I10 Essential (primary) hypertension: Secondary | ICD-10-CM

## 2013-11-15 DIAGNOSIS — I712 Thoracic aortic aneurysm, without rupture, unspecified: Secondary | ICD-10-CM

## 2013-11-15 DIAGNOSIS — F411 Generalized anxiety disorder: Secondary | ICD-10-CM

## 2013-11-15 DIAGNOSIS — I7121 Aneurysm of the ascending aorta, without rupture: Secondary | ICD-10-CM

## 2013-11-15 DIAGNOSIS — F419 Anxiety disorder, unspecified: Secondary | ICD-10-CM

## 2013-11-15 DIAGNOSIS — I4892 Unspecified atrial flutter: Secondary | ICD-10-CM

## 2013-11-15 NOTE — Patient Instructions (Addendum)
Stop apixaban.   Start aspirin 29m daily.   Your physician wants you to follow-up in: 6 months with Dr MAundra Dubin (June 2015).  You will receive a reminder letter in the mail two months in advance. If you don't receive a letter, please call our office to schedule the follow-up appointment.

## 2013-11-16 ENCOUNTER — Telehealth: Payer: Self-pay | Admitting: Cardiology

## 2013-11-16 ENCOUNTER — Telehealth: Payer: Self-pay | Admitting: Family Medicine

## 2013-11-16 NOTE — Telephone Encounter (Signed)
Left pt a message to call back. 

## 2013-11-16 NOTE — Telephone Encounter (Signed)
Please call patient states his cardiologist, Dr. Aundra Dubin was going to send you a message that he wanted his xanax changed to another medication

## 2013-11-16 NOTE — Telephone Encounter (Signed)
New message    Dr Redmond School has not received pres request to change xanax to another medication.  Dr Redmond School prescribed xanax but Dr Aundra Dubin does not want him to take it---according to pt

## 2013-11-18 DIAGNOSIS — F419 Anxiety disorder, unspecified: Secondary | ICD-10-CM | POA: Insufficient documentation

## 2013-11-18 NOTE — Progress Notes (Signed)
Patient ID: Lee Lee, male   DOB: 1951/08/14, 62 y.o.   MRN: [id number] PCP: Dr. Redmond School  62 yo with history of atrial flutter and HTN returns for cardiology evaluation.  In 10/13, he had tachypalpitations while playing golf with associated chest pressure.  He went to the ER.  While there, he had a coronary CT angiogram with no significant CAD.  Echo showed normal EF but the ascending aorta was dilated to 4.2 cm.  Lee Lee wore an event monitor for 3 wks.  This showed paroxysmal atrial flutter with HR to as high as the 150s as well as occasional events that may have been atrial fibrillation.  He saw Dr. [name] and had successful atrial flutter ablation in 2/14.  When I saw him last in the summer, he had had a couple of episodes of tachypalpitations.  He wore an event monitor in 6/14 that showed one episode of recurrent atrial flutter.  However, since that time, he has had no further palpitations.  He had a sleep study, showing only mild OSA.  No chest pain or exertional dyspnea.  He has cut back to 2-3 glasses wine nightly.   Main problem recently has been anxiety.  His parents both recently died and he has had some other family issues.  He has had extreme anxiety with panic attacks.  He has been seeing a Social worker.  He was given Xanax but does not like taking it.  BP is high today but he has not taken his meds yet.    Labs (11/13): TSH normal, LDL 89, HDL 56 Labs (12/13): K 4, creatinine 0.8 Labs (2/14): K 4.2, creatinine 0.9  ECG: NSR, iRBBB  PMH: 1. HTN 2. Erectile dysfunction 3. Palpitations: Echo (10/13) with EF 55-60%, mild LVH, trileaflet aortic valve with 4.1 cm aortic root and 4.2 cm ascending aorta.  4. Ascending aortic aneurysm: 4.2 cm on echo and CT in 10/13.  Aortic valve is trileaflet.  Suspect this is due to HTN.  CTA chest (10/14) with 4.1 cm ascending aorta.  5. Atypical chest pain: Coronary CT angiogram in 10/13 with coronary calcium score 0 and no significant CAD noted in  the coronaries.  6. Paroxysmal atrial flutter: First noted on 3 week event monitor in 12/13. Atrial flutter ablation in 2/14. Event monitor (6/14) with 1 episode atrial flutter.  7. Sleep study with mild OSA.    SH: Nonsmoker, married, works in Multimedia programmer.  Moderate ETOH intake.   FH: Mother had valvular heart disease, sounds like AI.   ROS: All systems reviewed and negative except as per HPI.   Current Outpatient Prescriptions  Medication Sig Dispense Refill  . ALPRAZolam (XANAX) 0.25 MG tablet Take 0.25 mg by mouth as needed for anxiety.      Marland Kitchen CIALIS 20 MG tablet TAKE 1 TABLET (20 MG TOTAL) BY MOUTH DAILY AS NEEDED FOR ERECTILE DYSFUNCTION  6 tablet  10  . losartan (COZAAR) 50 MG tablet Take 1.5 tablets (75 mg total) by mouth daily.  45 tablet  9  . metoprolol succinate (TOPROL-XL) 25 MG 24 hr tablet Take 1 tablet (25 mg total) by mouth daily. Take with or immediately following a meal.  30 tablet  9  . aspirin EC 81 MG tablet Take 1 tablet (81 mg total) by mouth daily.       No current facility-administered medications for this visit.    BP 156/92  Pulse 62  Ht 6' (1.829 m)  Wt 108.41 kg (239 lb)  BMI 32.41 kg/m2 General: NAD Neck: No JVD, no thyromegaly or thyroid nodule.  Lungs: Clear to auscultation bilaterally with normal respiratory effort. CV: Nondisplaced PMI.  Heart regular S1/S2, +S4, no murmur.  No peripheral edema.  No carotid bruit.  Normal pedal pulses.  Abdomen: Soft, nontender, no hepatosplenomegaly, no distention.  Neurologic: Alert and oriented x 3.  Psych: Normal affect. Extremities: No clubbing or cyanosis.   Assessment/Plan: 1. Paroxysmal atrial flutter: Status post atrial flutter ablation.  He had 1 episode of atrial flutter by monitor in 6/14.  Since then, he has had no further tachypalpitations.  Hopefully, his flutter ablation will prove successful long-term.  Only mild OSA on sleep study.  - CHADSVASC = 1 for HTN.  He can stop apixaban and take  ASA 81 daily.  - To decrease risk of recurrent atrial arrhythmias, would cut back on ETOH to no more than 1-2 drinks/day. - Continue Toprol XL.  - Continue BP control. 2. HTN:  BP high today but has not taken meds yet.  Continue losartan and Toprol XL.  He will check his BP at work and call us if BP runs > 140/90.  3. Ascending aortic aneurysm: Stable 4.1 cm on CTA in 10/14.  Trileaflet aortic valve.  I suspect this is due to hypertension (no evidence for collagen vascular disease).  Can repeat CTA or MRA at 2-3 year intervals.  4. Anxiety: Generalized anxiety with panic attacks.  He does not like taking Xanax.  I think that an SSRI trial could be helpful.  He is going to contact Dr. Redmond School about this.   [name] 11/18/2013

## 2013-11-19 ENCOUNTER — Other Ambulatory Visit: Payer: Self-pay

## 2013-11-19 ENCOUNTER — Telehealth: Payer: Self-pay | Admitting: Family Medicine

## 2013-11-19 MED ORDER — ALPRAZOLAM 0.25 MG PO TABS: 0.2500 mg | ORAL_TABLET | ORAL | Status: DC | PRN

## 2013-11-19 MED ORDER — CITALOPRAM HYDROBROMIDE 20 MG PO TABS: 20.0000 mg | ORAL_TABLET | Freq: Every day | ORAL | Status: DC

## 2013-11-19 NOTE — Telephone Encounter (Signed)
Called in xanax per jcl 

## 2013-11-19 NOTE — Telephone Encounter (Signed)
Continues to have difficulty with anxiety on a daily basis. I will renew his Xanax and place him on Celexa. He is to set up an appointment to see me in 3 or 4 weeks.

## 2013-11-20 NOTE — Telephone Encounter (Signed)
Will forward to Dr Aundra Dubin to see if he minds if the pt takes Xanax or not. Have not been able to contact pt.

## 2013-11-20 NOTE — Telephone Encounter (Signed)
Left message for pt OK to take Xanax per Dr Aundra Dubin.  Also left message for pt that he should expect a call from Dr Lanice Shirts office about starting new medication.  Requested he call back with any questions

## 2013-11-20 NOTE — Telephone Encounter (Signed)
It is ok to take Xanax (just don't combine with ETOH).  Also let him know that I heard from Dr. Redmond School who should be contacting the patient about starting an SSRI.

## 2013-11-27 NOTE — Telephone Encounter (Signed)
tsd

## 2013-12-13 ENCOUNTER — Ambulatory Visit (INDEPENDENT_AMBULATORY_CARE_PROVIDER_SITE_OTHER): Payer: BC Managed Care – PPO | Admitting: Family Medicine

## 2013-12-13 VITALS — BP 140/92 | HR 70 | Wt 240.0 lb

## 2013-12-13 DIAGNOSIS — F430 Acute stress reaction: Secondary | ICD-10-CM

## 2013-12-13 NOTE — Progress Notes (Signed)
   Subjective:    Patient ID: Lee Lee, male    DOB: Nov 30, 1950, 63 y.o.   MRN: [id number]  HPI He started taking Celexa and noted roughly an hour to hour and half later he had difficulty with cold sweats and shaking. He did this for several days and then stopped. Since then he notes that he does have difficulty stressful situations but responds well to Xanax. He takes roughly 3 of these per week. He also notes that he in general seems to be handling things much better and has to much more positive attitude. He notes that he is now not draining the day in terms of what might possibly happen negatively. He continues in counseling with Darryl Hyers and is making good progress there. They have been discussing the multiple major life changes that occurred in his life over the last year and his response to this. He does seem to be gaining insight into this.   Review of Systems     Objective:   Physical Exam Alert and in no distress with appropriate affect       Assessment & Plan:  Stress reaction  he will discontinue to see Darryl Hyers. We discussed the use of Xanax and since he is using relatively small amounts, I will continue him on this. Discussed the fact that with time hopefully he will need less and less of this. He is comfortable with this approach. He will call when he needs a refill.

## 2014-01-29 ENCOUNTER — Encounter: Payer: Self-pay | Admitting: Cardiology

## 2014-01-29 ENCOUNTER — Other Ambulatory Visit: Payer: Self-pay

## 2014-01-29 MED ORDER — METOPROLOL SUCCINATE ER 25 MG PO TB24: 25.0000 mg | ORAL_TABLET | Freq: Every day | ORAL | Status: DC

## 2014-02-02 ENCOUNTER — Other Ambulatory Visit: Payer: Self-pay | Admitting: Cardiology

## 2014-03-21 ENCOUNTER — Encounter: Payer: Self-pay | Admitting: Family Medicine

## 2014-03-21 MED ORDER — ALPRAZOLAM 0.25 MG PO TABS: 0.2500 mg | ORAL_TABLET | ORAL | Status: DC | PRN

## 2014-05-10 ENCOUNTER — Ambulatory Visit (INDEPENDENT_AMBULATORY_CARE_PROVIDER_SITE_OTHER): Payer: BC Managed Care – PPO | Admitting: Family Medicine

## 2014-05-10 ENCOUNTER — Encounter: Payer: Self-pay | Admitting: Family Medicine

## 2014-05-10 VITALS — BP 130/90

## 2014-05-10 DIAGNOSIS — S76011A Strain of muscle, fascia and tendon of right hip, initial encounter: Secondary | ICD-10-CM

## 2014-05-10 DIAGNOSIS — IMO0002 Reserved for concepts with insufficient information to code with codable children: Secondary | ICD-10-CM
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
## 2014-05-10 NOTE — Patient Instructions (Signed)
Heat for 20 minutes 3 times per day. You can take as many as 4 Advil 3 times per day. As long as you improve I will leave you alone however if you're  still having trouble call me in 2 weeks.

## 2014-05-16 ENCOUNTER — Encounter: Payer: Self-pay | Admitting: *Deleted

## 2014-05-20 ENCOUNTER — Encounter: Payer: Self-pay | Admitting: Cardiology

## 2014-05-20 ENCOUNTER — Ambulatory Visit (INDEPENDENT_AMBULATORY_CARE_PROVIDER_SITE_OTHER): Payer: BC Managed Care – PPO | Admitting: Cardiology

## 2014-05-20 VITALS — BP 162/92 | HR 74 | Ht 72.0 in | Wt 239.0 lb

## 2014-05-20 DIAGNOSIS — I10 Essential (primary) hypertension: Secondary | ICD-10-CM

## 2014-05-20 DIAGNOSIS — I483 Typical atrial flutter: Secondary | ICD-10-CM

## 2014-05-20 DIAGNOSIS — I7121 Aneurysm of the ascending aorta, without rupture: Secondary | ICD-10-CM

## 2014-05-20 DIAGNOSIS — I712 Thoracic aortic aneurysm, without rupture, unspecified: Secondary | ICD-10-CM

## 2014-05-20 DIAGNOSIS — I4892 Unspecified atrial flutter: Secondary | ICD-10-CM

## 2014-05-20 LAB — BASIC METABOLIC PANEL
BUN: 17 mg/dL (ref 6–23)
CHLORIDE: 103 meq/L (ref 96–112)
CO2: 29 meq/L (ref 19–32)
Calcium: 9.4 mg/dL (ref 8.4–10.5)
Creatinine, Ser: 0.8 mg/dL (ref 0.4–1.5)
GFR: 105.83 mL/min (ref >60.00)
GLUCOSE: 117 mg/dL — AB (ref 70–99)
POTASSIUM: 3.6 meq/L (ref 3.5–5.1)
SODIUM: 141 meq/L (ref 135–145)

## 2014-05-20 NOTE — Patient Instructions (Signed)
Your physician recommends that you have lab today--BMET.  Your physician has requested that you regularly monitor and record your blood pressure readings at home. Please use the same machine at the same time of day to check your readings and record them. I will call you in about 2 weeks to get the readings.   Your physician wants you to follow-up in: 1 year with Dr Aundra Dubin. (June 2016).You will receive a reminder letter in the mail two months in advance. If you don't receive a letter, please call our office to schedule the follow-up appointment.

## 2014-05-21 NOTE — Progress Notes (Signed)
Patient ID: Lee Lee, male   DOB: [birth year], 63 y.o.   MRN: [id number] PCP: Dr. Redmond School  63 yo with history of atrial flutter and HTN returns for cardiology evaluation.  In 10/13, he had tachypalpitations while playing golf with associated chest pressure.  He went to the ER.  While there, he had a coronary CT angiogram with no significant CAD.  Echo showed normal EF but the ascending aorta was dilated to 4.2 cm.  Lee Lee wore an event monitor for 3 wks.  This showed paroxysmal atrial flutter with HR to as high as the 150s as well as occasional events that may have been atrial fibrillation.  He saw Dr. [name] and had successful atrial flutter ablation in 2/14.  When I saw him last in the summer, he had had a couple of episodes of tachypalpitations.  He wore an event monitor in 6/14 that showed one episode of recurrent atrial flutter.  However, since that time, he has had no further palpitations.  He had a sleep study, showing only mild OSA.  No chest pain or exertional dyspnea. He golfs frequently and has been walking for exercise. Weight is stable.   Labs (11/13): TSH normal, LDL 89, HDL 56 Labs (12/13): K 4, creatinine 0.8 Labs (2/14): K 4.2, creatinine 0.9  ECG: NSR, iRBBB, nonspecific anterior T wave changes.   PMH: 1. HTN 2. Erectile dysfunction 3. Palpitations: Echo (10/13) with EF 55-60%, mild LVH, trileaflet aortic valve with 4.1 cm aortic root and 4.2 cm ascending aorta.  4. Ascending aortic aneurysm: 4.2 cm on echo and CT in 10/13.  Aortic valve is trileaflet.  Suspect this is due to HTN.  CTA chest (10/14) with 4.1 cm ascending aorta.  5. Atypical chest pain: Coronary CT angiogram in 10/13 with coronary calcium score 0 and no significant CAD noted in the coronaries.  6. Paroxysmal atrial flutter: First noted on 3 week event monitor in 12/13. Atrial flutter ablation in 2/14. Event monitor (6/14) with 1 episode atrial flutter.  7. Sleep study with mild OSA.   SH: Nonsmoker,  married, works in Multimedia programmer.  Moderate ETOH intake.   FH: Mother had valvular heart disease, sounds like AI.   ROS: All systems reviewed and negative except as per HPI.   Current Outpatient Prescriptions  Medication Sig Dispense Refill  . ALPRAZolam (XANAX) 0.25 MG tablet Take 1 tablet (0.25 mg total) by mouth as needed for anxiety.  30 tablet  0  . aspirin EC 81 MG tablet Take 1 tablet (81 mg total) by mouth daily.      Marland Kitchen CIALIS 20 MG tablet TAKE 1 TABLET (20 MG TOTAL) BY MOUTH DAILY AS NEEDED FOR ERECTILE DYSFUNCTION  6 tablet  10  . losartan (COZAAR) 50 MG tablet TAKE ONE AND ONE-HALF TABLETS BY MOUTH DAILY  45 tablet  3  . metoprolol succinate (TOPROL-XL) 25 MG 24 hr tablet Take 1 tablet (25 mg total) by mouth daily. Take with or immediately following a meal.  30 tablet  9   No current facility-administered medications for this visit.    BP 162/92  Pulse 74  Ht 6' (1.829 m)  Wt 239 lb (108.41 kg)  BMI 32.41 kg/m2 General: NAD Neck: No JVD, no thyromegaly or thyroid nodule.  Lungs: Clear to auscultation bilaterally with normal respiratory effort. CV: Nondisplaced PMI.  Heart regular S1/S2, +S4, no murmur.  No peripheral edema.  No carotid bruit.  Normal pedal pulses.  Abdomen: Soft, nontender, no hepatosplenomegaly, no distention.  Neurologic: Alert and oriented x 3.  Psych: Normal affect. Extremities: No clubbing or cyanosis.   Assessment/Plan: 1. Paroxysmal atrial flutter: Status post atrial flutter ablation.  He had 1 episode of atrial flutter by monitor in 6/14.  Since then, he has had no further tachypalpitations.  Hopefully, his flutter ablation will prove successful long-term.  Only mild OSA on sleep study.  - CHADSVASC = 1 for HTN.  He is off apixaban and taking ASA.  - To decrease risk of recurrent atrial arrhythmias, would keep ETOH to no more than 1-2 drinks/day. - Continue Toprol XL.  - Continue BP control. 2. HTN:  BP high today but has been controlled at  home.  Continue losartan and Toprol XL.  He is going to check his BP daily for the next 2 wks and we will call for the readings. 3. Ascending aortic aneurysm: Stable 4.1 cm on CTA in 10/14.  Trileaflet aortic valve.  I suspect this is due to hypertension (no evidence for collagen vascular disease).  Can repeat CTA or MRA at 2-3 year intervals.   [name] 05/21/2014

## 2014-05-22 NOTE — Addendum Note (Signed)
Addended by: Katrine Coho on: 05/22/2014 07:29 AM   Modules accepted: Orders

## 2014-06-11 ENCOUNTER — Telehealth: Payer: Self-pay | Admitting: *Deleted

## 2014-06-11 DIAGNOSIS — I10 Essential (primary) hypertension: Secondary | ICD-10-CM

## 2014-06-11 NOTE — Telephone Encounter (Signed)
Copied from Dr Claris Gladden 05/20/14 office note:   HTN: BP high today but has been controlled at home. Continue losartan and Toprol XL. He is going to check his BP daily for the next 2 wks and we will call for the readings.  06/11/14 LMTCB

## 2014-06-12 MED ORDER — LOSARTAN POTASSIUM 100 MG PO TABS: 100.0000 mg | ORAL_TABLET | Freq: Every day | ORAL | Status: DC

## 2014-06-12 NOTE — Telephone Encounter (Signed)
Pt advised,verbalized understanding. 

## 2014-06-12 NOTE — Telephone Encounter (Signed)
Increase losartan to 100 mg daily with BMET in 2 wks.

## 2014-06-12 NOTE — Telephone Encounter (Signed)
Pt states that BP almost daily since office visit with Dr Aundra Dubin has been averaging 150/90.   I will forward to Dr Aundra Dubin for review.

## 2014-06-25 ENCOUNTER — Other Ambulatory Visit (INDEPENDENT_AMBULATORY_CARE_PROVIDER_SITE_OTHER): Payer: BC Managed Care – PPO

## 2014-06-25 DIAGNOSIS — I10 Essential (primary) hypertension: Secondary | ICD-10-CM

## 2014-06-25 LAB — BASIC METABOLIC PANEL
BUN: 15 mg/dL (ref 6–23)
CHLORIDE: 106 meq/L (ref 96–112)
CO2: 28 meq/L (ref 19–32)
CREATININE: 1 mg/dL (ref 0.4–1.5)
Calcium: 9.2 mg/dL (ref 8.4–10.5)
GFR: 80.6 mL/min (ref >60.00)
Glucose, Bld: 91 mg/dL (ref 70–99)
Potassium: 4.5 mEq/L (ref 3.5–5.1)
Sodium: 139 mEq/L (ref 135–145)

## 2014-06-27 ENCOUNTER — Other Ambulatory Visit: Payer: BC Managed Care – PPO

## 2014-07-27 ENCOUNTER — Encounter: Payer: Self-pay | Admitting: Family Medicine

## 2014-07-27 ENCOUNTER — Other Ambulatory Visit: Payer: Self-pay | Admitting: Family Medicine

## 2014-07-29 ENCOUNTER — Other Ambulatory Visit: Payer: Self-pay | Admitting: Family Medicine

## 2014-07-29 MED ORDER — ALPRAZOLAM 0.25 MG PO TABS: 0.2500 mg | ORAL_TABLET | ORAL | Status: DC | PRN

## 2014-07-29 NOTE — Telephone Encounter (Signed)
Is this okay?

## 2014-07-30 NOTE — Telephone Encounter (Signed)
Dr. Redmond School approved this yesterday and I am not sure if it got called in or not. So i will call it in and refuse this one, as it was already done yesterday.

## 2014-08-31 ENCOUNTER — Encounter: Payer: Self-pay | Admitting: Family Medicine

## 2014-11-07 ENCOUNTER — Encounter (HOSPITAL_COMMUNITY): Payer: Self-pay | Admitting: Internal Medicine

## 2014-11-15 ENCOUNTER — Telehealth: Payer: Self-pay | Admitting: Family Medicine

## 2014-11-15 ENCOUNTER — Other Ambulatory Visit: Payer: Self-pay

## 2014-11-15 MED ORDER — ALPRAZOLAM 0.25 MG PO TABS: 0.2500 mg | ORAL_TABLET | ORAL | Status: DC | PRN

## 2014-11-15 NOTE — Telephone Encounter (Signed)
Pt called and stated he needed a refill on xanax sent to [address]. Pt can be reached at [phone].

## 2014-11-15 NOTE — Telephone Encounter (Signed)
Call it in

## 2014-11-15 NOTE — Telephone Encounter (Signed)
done

## 2014-11-15 NOTE — Telephone Encounter (Signed)
Called in xanax per jcl 

## 2015-01-14 ENCOUNTER — Ambulatory Visit (INDEPENDENT_AMBULATORY_CARE_PROVIDER_SITE_OTHER): Payer: BLUE CROSS/BLUE SHIELD | Admitting: Cardiology

## 2015-01-14 ENCOUNTER — Encounter: Payer: Self-pay | Admitting: Cardiology

## 2015-01-14 VITALS — BP 130/100 | HR 61 | Ht 72.0 in | Wt 246.4 lb

## 2015-01-14 DIAGNOSIS — Z8679 Personal history of other diseases of the circulatory system: Secondary | ICD-10-CM

## 2015-01-14 DIAGNOSIS — I10 Essential (primary) hypertension: Secondary | ICD-10-CM

## 2015-01-14 NOTE — Patient Instructions (Addendum)
Your physician recommends that you continue on your current medications as directed. Please refer to the Current Medication list given to you today.  Lab Today: Tsh  Your physician has recommended that you wear an event monitor. Event monitors are medical devices that record the heart's electrical activity. Doctors most often [place] these monitors to diagnose arrhythmias. Arrhythmias are problems with the speed or rhythm of the heartbeat. The monitor is a small, portable device. You can wear one while you do your normal daily activities. This is usually used to diagnose what is causing palpitations/syncope (passing out).  Your physician recommends that you schedule a follow-up appointment after the monitor is completed in 4 weeks with Dr.Mclean or a NP/PA

## 2015-01-14 NOTE — Progress Notes (Signed)
01/14/2015 Lee Lee   1951/11/26  [id number]  Primary Physician: Lee Haste, MD Primary Cardiologist: Dr. Aundra Lee Electrophysiologist: Dr. Lovena Lee  Reason for visit/CC: Palpitations  HPI:  The patient is a 64 y/o male with history of atrial flutter and HTN. In 10/13, he had tachypalpitations while playing golf with associated chest pressure. He went to the ER. While there, he had a coronary CT angiogram with no significant CAD. Echo showed normal EF but the ascending aorta was dilated to 4.2 cm. Mr.  Lee Lee wore an event monitor for 3 wks. This showed paroxysmal atrial flutter with HR to as high as the 150s as well as occasional events that may have been atrial fibrillation. He saw Dr. Lovena Lee and had successful atrial flutter ablation in 2/14. He later developed recurrent symptoms and he wore an event monitor in 6/14 that showed one episode of recurrent atrial flutter. He had a sleep study, showing only mild OSA. He is not on CPAP therapy. He takes 25 mg of Toprol XL daily. He takes ASA for stroke prophylaxis due to a low CHA2DS2 VASc score of 1 (HTN).  He presents back to clinic today with complaints of recurrent palpitations. These have been occurring intermittently over the last month. He has a Ecologist and has recorded heart rates in the 130s to 140s.  Episodes occur spontaneously without any identifiable  triggers. He reports avoidance of caffeine and no recent increased stress. He drinks on average 2-3 glasses of wine a day but denies any association between the timing of his alcohol consumption and the onset of his episodes. He denies any significant associated dyspnea, lightheadedness, dizziness, syncope/near-syncope. These episodes have felt very similar to his atrial flutter episodes in the past. He reports full medication compliance with Metroprolol and aspirin.   His EKG today demonstrates sinus rhythm with a ventricular rate of 61 bpm.  Current Outpatient  Prescriptions  Medication Sig Dispense Refill  . ALPRAZolam (XANAX) 0.25 MG tablet Take 1 tablet (0.25 mg total) by mouth as needed for anxiety. 30 tablet 1  . aspirin EC 81 MG tablet Take 1 tablet (81 mg total) by mouth daily.    Lee Lee Lee Lee CIALIS 20 MG tablet TAKE 1 TABLET (20 MG TOTAL) BY MOUTH DAILY AS NEEDED FOR ERECTILE DYSFUNCTION 6 tablet 9  . losartan (COZAAR) 100 MG tablet Take 1 tablet (100 mg total) by mouth daily. 90 tablet 3  . metoprolol succinate (TOPROL-XL) 25 MG 24 hr tablet Take 1 tablet (25 mg total) by mouth daily. Take with or immediately following a meal. 30 tablet 9   No current facility-administered medications for this visit.    No Known Allergies  History   Social History  . Marital Status: Married    Spouse Name: N/A  . Number of Children: N/A  . Years of Education: N/A   Occupational History  . Not on file.   Social History Main Topics  . Smoking status: Former Smoker    Quit date: 01/06/1988  . Smokeless tobacco: Former Systems developer    Quit date: 03/05/2012  . Alcohol Use: Yes     Comment: wine daily  . Drug Use: No  . Sexual Activity: Not on file   Other Topics Concern  . Not on file   Social History Narrative     Review of Systems: General: negative for chills, fever, night sweats or weight changes.  Cardiovascular: negative for chest pain, dyspnea on exertion, edema, orthopnea, palpitations, paroxysmal nocturnal dyspnea or shortness of  breath Dermatological: negative for rash Respiratory: negative for cough or wheezing Urologic: negative for hematuria Abdominal: negative for nausea, vomiting, diarrhea, bright red blood per rectum, melena, or hematemesis Neurologic: negative for visual changes, syncope, or dizziness All other systems reviewed and are otherwise negative except as noted above.    Blood pressure 130/100, pulse 61, height 6' (1.829 m), weight 246 lb 6.4 oz (111.766 kg).  General appearance: alert, cooperative and no distress Neck: no  carotid bruit and no JVD Lungs: clear to auscultation bilaterally Heart: regular rate and rhythm, S1, S2 normal, no murmur, click, rub or gallop Extremities: no LEE Pulses: 2+ and symmetric Skin: warm and dry Neurologic: Grossly normal  EKG NSR. HR 61 bpm  ASSESSMENT AND PLAN:   1. Recurrent Palpitations: patient feels he his having recurrent Afib/ flutter. His EKG today demonstrates NSR with a HR of 61 bpm. We will evaluate with a 4 week cardiac monitor. Will also check a TSH. F/U with extender in 4 weeks. If recurrent afib/ flutter then will need to be refered back to Dr. Lovena Lee. For now, continue current dose of BB. There is no room with HR to increase. Continue at 25 mg Toprol XL daily. Continue daily ASA (CHA2DS2 VASc score is 1(HTN)).   2. HTN: BP is moderately elevated but he reports lower levels at home. Continue current meds.   Keyuana Wank, BRITTAINYPA-C 01/14/2015 3:51 PM

## 2015-01-15 ENCOUNTER — Encounter (INDEPENDENT_AMBULATORY_CARE_PROVIDER_SITE_OTHER): Payer: BLUE CROSS/BLUE SHIELD

## 2015-01-15 ENCOUNTER — Encounter: Payer: Self-pay | Admitting: *Deleted

## 2015-01-15 DIAGNOSIS — I480 Paroxysmal atrial fibrillation: Secondary | ICD-10-CM

## 2015-01-15 DIAGNOSIS — R002 Palpitations: Secondary | ICD-10-CM | POA: Diagnosis not present

## 2015-01-15 DIAGNOSIS — Z8679 Personal history of other diseases of the circulatory system: Secondary | ICD-10-CM

## 2015-01-15 NOTE — Progress Notes (Signed)
Patient ID: Lee Lee, male   DOB: July 27, 1951, 64 y.o.   MRN: [id number] [employer] 30 day cardiac event monitor applied to patient.

## 2015-01-22 ENCOUNTER — Telehealth: Payer: Self-pay | Admitting: *Deleted

## 2015-01-22 MED ORDER — APIXABAN 5 MG PO TABS: 5.0000 mg | ORAL_TABLET | Freq: Two times a day (BID) | ORAL | Status: DC

## 2015-01-22 MED ORDER — METOPROLOL SUCCINATE ER 25 MG PO TB24: 25.0000 mg | ORAL_TABLET | Freq: Two times a day (BID) | ORAL | Status: DC

## 2015-01-22 NOTE — Telephone Encounter (Signed)
LMTCB

## 2015-01-22 NOTE — Telephone Encounter (Signed)
LMTCB at work and home numbers listed.

## 2015-01-22 NOTE — Telephone Encounter (Signed)
Pt advised,verbalized understanding. 

## 2015-01-22 NOTE — Telephone Encounter (Signed)
Pt states he is going to [facility] until next Wednesday.  Pt advised I will ask [facility] to contact him to schedule appt with Dr [name] at [phone].

## 2015-01-22 NOTE — Telephone Encounter (Signed)
I just  received call from [place] at [facility] [phone] is calling to report first documented episode of atrial flutter at 01/21/15 4:03 PM (CT)    She reports atrial flutter heart rates 128-138 at 4:03 PM (CST). I called back to ask how long patient was in atrial flutter. Teinesher states next documented tracing about 10 AM (CT) this morning- the patient was in atrial flutter, the patient had done a manuel transmission.  I will review with Dr Aundra Dubin.

## 2015-01-22 NOTE — Telephone Encounter (Signed)
He has symptomatic recurrent atrial flutter.  Increase Toprol XL to 25 mg bid.  Would stop ASA and restart Eliquis 5 mg bid in anticipation of possible redo atrial flutter ablation. Ideally would see Dr. [name] soon for evaluation of redo ablation but if he cannot get in with him see me.

## 2015-01-22 NOTE — Telephone Encounter (Signed)
Follow up:     Pt returned this office call.   Please give him a call back.

## 2015-01-27 ENCOUNTER — Telehealth: Payer: Self-pay | Admitting: Cardiology

## 2015-01-27 NOTE — Telephone Encounter (Signed)
I have not called pt since I spoke with him 01/22/15.  LMTCB for pt, not sure what this is regarding.

## 2015-01-27 NOTE — Telephone Encounter (Signed)
New problem   Pt stated he is returning your call. Please call pt.

## 2015-01-30 ENCOUNTER — Ambulatory Visit (INDEPENDENT_AMBULATORY_CARE_PROVIDER_SITE_OTHER): Payer: BLUE CROSS/BLUE SHIELD | Admitting: Internal Medicine

## 2015-01-30 ENCOUNTER — Encounter: Payer: Self-pay | Admitting: Cardiology

## 2015-01-30 ENCOUNTER — Encounter: Payer: Self-pay | Admitting: Internal Medicine

## 2015-01-30 VITALS — BP 148/98 | HR 113 | Ht 71.0 in | Wt 242.1 lb

## 2015-01-30 DIAGNOSIS — I712 Thoracic aortic aneurysm, without rupture: Secondary | ICD-10-CM

## 2015-01-30 DIAGNOSIS — I484 Atypical atrial flutter: Secondary | ICD-10-CM

## 2015-01-30 DIAGNOSIS — I10 Essential (primary) hypertension: Secondary | ICD-10-CM

## 2015-01-30 DIAGNOSIS — I7121 Aneurysm of the ascending aorta, without rupture: Secondary | ICD-10-CM

## 2015-01-30 DIAGNOSIS — R002 Palpitations: Secondary | ICD-10-CM

## 2015-01-30 MED ORDER — METOPROLOL SUCCINATE ER 25 MG PO TB24: 25.0000 mg | ORAL_TABLET | Freq: Two times a day (BID) | ORAL | Status: DC

## 2015-01-30 NOTE — Patient Instructions (Signed)
Call with your decision regarding an ablation [phone]

## 2015-01-30 NOTE — Telephone Encounter (Signed)
Pt requesting refill of Toprol XL, this has been done.

## 2015-01-30 NOTE — Assessment & Plan Note (Signed)
His blood pressure is elevated. He admits to some anxiety. He is encouraged to reduce his sodium and ETOH intake. He will followup with his primary cardiologist.

## 2015-01-30 NOTE — Assessment & Plan Note (Signed)
There is a 50% chance that his flutter has recurred (vs a different tachycardia). I have given him 3 options. Watchful waiting and continued current meds. Anti-arrhythmic drug therapy. Catheter ablation. He is considering his options. Will see him back as needed. He will call us if he wants to proceed with ablation. I would use CARTO if he chooses the ablation route with an irrigated catheter if he is found to have recurrent typical atrial flutter.

## 2015-01-30 NOTE — Assessment & Plan Note (Signed)
Stable with no evidence of pain or other symptoms.

## 2015-01-30 NOTE — Progress Notes (Signed)
HPI [name] returns today for followup. He is a very pleasant 64 year old man with a history of atrial flutter, status post catheter ablation. He has had no recurrent palpitations or documented atrial arrhythmias until a few weeks ago.  He denies chest pain or shortness of breath. He does admit to dietary indiscretion with alcohol though he has tried to reduce his ETOH consumption. He has worn a cardiac monitor and found to have a recurrent tachycardia which may be atrial flutter vs atrial tachycardia. He was placed on eliquis and metoprolol and his symptoms are much improved. No Known Allergies   Current Outpatient Prescriptions  Medication Sig Dispense Refill  . ALPRAZolam (XANAX) 0.25 MG tablet Take 1 tablet (0.25 mg total) by mouth as needed for anxiety. (Patient taking differently: Take 0.25 mg by mouth daily as needed for anxiety. ) 30 tablet 1  . apixaban (ELIQUIS) 5 MG TABS tablet Take 1 tablet (5 mg total) by mouth 2 (two) times daily. 60 tablet 0  . CIALIS 20 MG tablet TAKE 1 TABLET (20 MG TOTAL) BY MOUTH DAILY AS NEEDED FOR ERECTILE DYSFUNCTION 6 tablet 9  . losartan (COZAAR) 100 MG tablet Take 1 tablet (100 mg total) by mouth daily. 90 tablet 3  . metoprolol succinate (TOPROL-XL) 25 MG 24 hr tablet Take 1 tablet (25 mg total) by mouth 2 (two) times daily. Take with or immediately following a meal. 60 tablet 6   No current facility-administered medications for this visit.     Past Medical History  Diagnosis Date  . Calcium oxalate renal stones   . Hemorrhoids   . ED (erectile dysfunction)   . Complication of anesthesia     makes him loopy  . Hypertension   . Atrial flutter     s/p RFCA 01/05/13  . Arthritis     back & knees    ROS:   All systems reviewed and negative except as noted in the HPI.   Past Surgical History  Procedure Laterality Date  . Colonoscopy  11/2009    Dr. Benson Norway  . Arthroscopic repair acl    . Rotator cuff repair    . Ablation of dysrhythmic  focus  01/05/2013  . Atrial flutter ablation N/A 01/05/2013    Procedure: ATRIAL FLUTTER ABLATION;  Surgeon: Evans Lance, MD;  Location: [facility] CATH LAB;  Service: Cardiovascular;  Laterality: N/A;     Family History  Problem Relation Age of Onset  . Valvular heart disease Mother   . Lymphoma Mother      History   Social History  . Marital Status: Married    Spouse Name: N/A  . Number of Children: N/A  . Years of Education: N/A   Occupational History  . Not on file.   Social History Main Topics  . Smoking status: Former Smoker    Quit date: 01/06/1988  . Smokeless tobacco: Former Systems developer    Quit date: 03/05/2012  . Alcohol Use: Yes     Comment: wine daily  . Drug Use: No  . Sexual Activity: Not on file   Other Topics Concern  . Not on file   Social History Narrative     BP 148/98 mmHg  Pulse 113  Ht 5' 11"  (1.803 m)  Wt 242 lb 1.9 oz (109.825 kg)  BMI 33.78 kg/m2  Physical Exam:  Well appearing middle-aged man,NAD HEENT: Unremarkable Neck:  No JVD, no thyromegally Back:  No CVA tenderness Lungs:  Clear with no wheezes, rales, or rhonchi.  HEART:  Regular rate rhythm, no murmurs, no rubs, no clicks Abd:  soft, positive bowel sounds, no organomegally, no rebound, no guarding Ext:  2 plus pulses, no edema, no cyanosis, no clubbing Skin:  No rashes no nodules Neuro:  CN II through XII intact, motor grossly intact  EKG - normal sinus rhythm with incomplete right bundle branch block.  Assess/Plan:

## 2015-01-30 NOTE — Assessment & Plan Note (Signed)
These have improved since starting his beta blocker. Will follow.

## 2015-02-17 ENCOUNTER — Ambulatory Visit: Payer: BLUE CROSS/BLUE SHIELD | Admitting: Physician Assistant

## 2015-02-17 ENCOUNTER — Telehealth: Payer: Self-pay | Admitting: *Deleted

## 2015-02-17 NOTE — Telephone Encounter (Signed)
lmptcb about appt today with Brynda Rim. PA. Per Nicki Reaper this appt was made when pt had seen PA Ellen Henri 2/16, pt then had his f/u w/Dr. [name] 01/30/15 see Dr. [name] note f/u PRN, pt was given options for his a-fib.

## 2015-02-22 ENCOUNTER — Other Ambulatory Visit: Payer: Self-pay | Admitting: Cardiology

## 2015-02-23 ENCOUNTER — Other Ambulatory Visit: Payer: Self-pay | Admitting: Cardiology

## 2015-02-24 ENCOUNTER — Other Ambulatory Visit: Payer: Self-pay | Admitting: *Deleted

## 2015-02-28 ENCOUNTER — Telehealth: Payer: Self-pay | Admitting: *Deleted

## 2015-02-28 NOTE — Telephone Encounter (Signed)
Dr Aundra Dubin reviewed monitor done 01/15/15-02/13/15:  Episodes of atrial flutter, see Dr Tanna Furry 01/30/15 office note.

## 2015-03-27 ENCOUNTER — Telehealth: Payer: Self-pay | Admitting: Family Medicine

## 2015-03-27 NOTE — Telephone Encounter (Signed)
Pt stopped by and requested a refill on xanax. Pt was informed jcl out and will be handled on Monday. Pt can be reached at [phone] and uses [facility] on [address]

## 2015-03-28 MED ORDER — ALPRAZOLAM 0.25 MG PO TABS: 0.2500 mg | ORAL_TABLET | ORAL | Status: DC | PRN

## 2015-03-28 NOTE — Telephone Encounter (Signed)
Called out med to pharmacy 

## 2015-03-28 NOTE — Telephone Encounter (Signed)
Okay for refill?  

## 2015-04-16 ENCOUNTER — Encounter: Payer: Self-pay | Admitting: Family Medicine

## 2015-04-16 ENCOUNTER — Ambulatory Visit (INDEPENDENT_AMBULATORY_CARE_PROVIDER_SITE_OTHER): Payer: BLUE CROSS/BLUE SHIELD | Admitting: Family Medicine

## 2015-04-16 DIAGNOSIS — R002 Palpitations: Secondary | ICD-10-CM

## 2015-04-16 DIAGNOSIS — C439 Malignant melanoma of skin, unspecified: Secondary | ICD-10-CM

## 2015-04-16 DIAGNOSIS — F419 Anxiety disorder, unspecified: Secondary | ICD-10-CM

## 2015-04-16 DIAGNOSIS — I4892 Unspecified atrial flutter: Secondary | ICD-10-CM | POA: Diagnosis not present

## 2015-04-16 NOTE — Progress Notes (Signed)
   Subjective:    Patient ID: Lee Lee, male    DOB: [birth year], 64 y.o.   MRN: [id number]  HPI He is here for consult concerning multiple issues. He has had increased difficulty with fluttering. He did have an ablation 2 years ago. He was recently seen again because of increased difficulty and his metoprolol was increased which apparently has helped with the fluttering. Approximately 2 weeks ago he noted the onset of difficulty with anxiety attacks. Date tend to occur almost daily. On one episode of walking on the beach he did cry spontaneously for no particular reason. This has him quite concerned. He has been in counseling in the past and felt that things were going quite well.   Review of Systems     Objective:   Physical Exam Alert and in no distress otherwise not examined       Assessment & Plan:  Palpitations  Atrial flutter, unspecified  Anxiety  Melanoma of skin I discussed the atrial flutter with him. He seems to have a good handle on this and continue to handle this conservatively but consider possibly another ablation in later date. We then discussed his panic attacks and anxiety. He does have Xanax and will use this. I cautioned him to use this on an as-needed basis rather than taking it for prevention. Discussed possibly getting back involved in counseling. We will reconsider this at a later date. He is to use Xanax and follow-up with me in several weeks. Also of note is the fact that he apparently had a melanoma removed from the right shoulder area.

## 2015-05-08 ENCOUNTER — Other Ambulatory Visit: Payer: Self-pay | Admitting: Family Medicine

## 2015-05-09 ENCOUNTER — Other Ambulatory Visit: Payer: Self-pay

## 2015-05-09 MED ORDER — ALPRAZOLAM 0.25 MG PO TABS: 0.2500 mg | ORAL_TABLET | ORAL | Status: DC | PRN

## 2015-05-09 NOTE — Telephone Encounter (Signed)
Called in xanax per jcl 

## 2015-05-19 ENCOUNTER — Encounter: Payer: Self-pay | Admitting: Family Medicine

## 2015-05-20 ENCOUNTER — Ambulatory Visit (INDEPENDENT_AMBULATORY_CARE_PROVIDER_SITE_OTHER): Payer: BLUE CROSS/BLUE SHIELD | Admitting: Family Medicine

## 2015-05-20 DIAGNOSIS — F4323 Adjustment disorder with mixed anxiety and depressed mood: Secondary | ICD-10-CM | POA: Diagnosis not present

## 2015-05-20 MED ORDER — CITALOPRAM HYDROBROMIDE 20 MG PO TABS: 20.0000 mg | ORAL_TABLET | Freq: Every day | ORAL | Status: DC

## 2015-05-20 NOTE — Progress Notes (Signed)
   Subjective:    Patient ID: Lee Lee, male    DOB: 08/20/1951, 64 y.o.   MRN: [id number]  HPI He is here for consult concerning starting an antidepressive. He states that he is having worsening anxiety and depression symptoms. He is also started drinking more wine. Going from 1-2 per night to 3 or 4 to help numb his feelings. He does state stresses in his life being his children are gone and his job will end September 30. He has seen Darryl Hyers in the past but has been unable to maintain a regular schedule with her.   Review of Systems     Objective:   Physical Exam Alert and in no distress with a flat affect       Assessment & Plan:  Adjustment reaction with anxiety and depression - Plan: citalopram (CELEXA) 20 MG tablet I will place him on Celexa. Discussed possible side effects with him. Also discussed the life changes and he is going through. He is aware of this but I am not sure he is willing to put the effort into it. He did leave fairly quickly.

## 2015-05-22 ENCOUNTER — Other Ambulatory Visit: Payer: Self-pay | Admitting: Cardiology

## 2015-05-29 ENCOUNTER — Telehealth: Payer: Self-pay | Admitting: Family Medicine

## 2015-05-29 ENCOUNTER — Telehealth: Payer: Self-pay | Admitting: Internal Medicine

## 2015-05-29 NOTE — Telephone Encounter (Signed)
Discussed with Dr [name] and he needs to come in for an office visit

## 2015-05-29 NOTE — Telephone Encounter (Signed)
New Message  Pt calling to speak w/ RN about going forward w/ ablation- previously discussed w/ Dr. [name]. Please call back and discuss.

## 2015-05-29 NOTE — Telephone Encounter (Signed)
Pt is on vacation and called and stated that the new medication, Celexa, is making him nauseous. He is requesting a call back to let him know what he can do. I did tell pt to find a pharmacy where he is at in [place] in case you wanted to send in something different. Please call pt at [phone].

## 2015-05-29 NOTE — Telephone Encounter (Signed)
He states that the medication is making him nauseous. I initially Wanted to switch to different med but he decided try half a pill and see if that would help. If not when he comes back I will switch him to a different medication

## 2015-05-30 ENCOUNTER — Telehealth: Payer: Self-pay | Admitting: Family Medicine

## 2015-05-30 MED ORDER — PAROXETINE HCL 10 MG PO TABS: 10.0000 mg | ORAL_TABLET | Freq: Every day | ORAL | Status: DC

## 2015-05-30 NOTE — Telephone Encounter (Signed)
rx sent

## 2015-05-30 NOTE — Telephone Encounter (Signed)
Call in Paxil 10 mg one daily #30

## 2015-05-30 NOTE — Telephone Encounter (Signed)
ALERT: PT ON VACATION ANY MEDICATION NEEDS TO BE SENT TO TEMPORARY PHARMACY. Pt. Call and stated that cutting the medication in half did not help. He is requesting that something else be called in for him. Please send to [place]. Send to [facility] [place] [facility]. Pt can be reached at [phone].

## 2015-06-06 ENCOUNTER — Encounter: Payer: Self-pay | Admitting: Family Medicine

## 2015-06-09 ENCOUNTER — Other Ambulatory Visit: Payer: Self-pay | Admitting: Family Medicine

## 2015-06-09 ENCOUNTER — Encounter: Payer: Self-pay | Admitting: Family Medicine

## 2015-06-09 MED ORDER — ZOLPIDEM TARTRATE 10 MG PO TABS: 10.0000 mg | ORAL_TABLET | Freq: Every evening | ORAL | Status: DC | PRN

## 2015-06-11 ENCOUNTER — Ambulatory Visit (INDEPENDENT_AMBULATORY_CARE_PROVIDER_SITE_OTHER): Payer: BLUE CROSS/BLUE SHIELD | Admitting: Internal Medicine

## 2015-06-11 ENCOUNTER — Encounter: Payer: Self-pay | Admitting: *Deleted

## 2015-06-11 ENCOUNTER — Encounter: Payer: Self-pay | Admitting: Internal Medicine

## 2015-06-11 ENCOUNTER — Encounter: Payer: Self-pay | Admitting: Family Medicine

## 2015-06-11 VITALS — BP 124/82 | HR 62 | Ht 71.0 in | Wt 233.4 lb

## 2015-06-11 DIAGNOSIS — R002 Palpitations: Secondary | ICD-10-CM

## 2015-06-11 DIAGNOSIS — I484 Atypical atrial flutter: Secondary | ICD-10-CM | POA: Diagnosis not present

## 2015-06-11 DIAGNOSIS — I10 Essential (primary) hypertension: Secondary | ICD-10-CM | POA: Diagnosis not present

## 2015-06-11 NOTE — Assessment & Plan Note (Addendum)
He has recurrent symptoms. It is unclear as to whether he has recurrent typical flutter or another atrial arrhythmia. My plan is to proceed with CARTO guided ablation of atrial flutter. Will plan to use an irrigated cathter. I have discussed the indications, risks/benefits/goals/expectations of the procedure with the patient and he wishes to proceed. Because he was difficult to sedate before, will plan to use anesthesia.

## 2015-06-11 NOTE — Assessment & Plan Note (Signed)
His blood pressure is well controlled. No medication changes.

## 2015-06-11 NOTE — Assessment & Plan Note (Signed)
He has developed worsening symptoms. He can continue his current meds. His symptoms should improve with ablation.

## 2015-06-11 NOTE — Progress Notes (Signed)
HPI [name] returns today for followup. He is a very pleasant 64 year old man with a history of atrial flutter, status post catheter ablation. He has had no recurrent palpitations or documented atrial arrhythmias until a few months ago.  He denies chest pain or shortness of breath. He does admit to dietary indiscretion with alcohol though he has tried to reduce his ETOH consumption. He has worn a cardiac monitor and found to have a recurrent tachycardia which may be atrial flutter vs atrial tachycardia. He was placed on eliquis and metoprolol and his symptoms initially improved. However over the past few weeks he has had increasingly worse palpitations. He has gotten where he cannot stop thinking about when his heart is going to race. He has not had syncope. No Known Allergies   Current Outpatient Prescriptions  Medication Sig Dispense Refill  . citalopram (CELEXA) 20 MG tablet Take 1 tablet (20 mg total) by mouth daily. 30 tablet 2  . ELIQUIS 5 MG TABS tablet TAKE 1 TABLET (5 MG TOTAL) BY MOUTH 2 (TWO) TIMES DAILY. 60 tablet 6  . losartan (COZAAR) 100 MG tablet TAKE 1 TABLET (100 MG TOTAL) BY MOUTH DAILY. 90 tablet 0  . metoprolol succinate (TOPROL-XL) 25 MG 24 hr tablet Take 1 tablet (25 mg total) by mouth 2 (two) times daily. Take with or immediately following a meal. 180 tablet 3  . PARoxetine (PAXIL) 10 MG tablet Take 1 tablet (10 mg total) by mouth daily. 30 tablet 0  . zolpidem (AMBIEN) 10 MG tablet Take 5 mg by mouth at bedtime as needed for sleep (sleep).     No current facility-administered medications for this visit.     Past Medical History  Diagnosis Date  . Calcium oxalate renal stones   . Hemorrhoids   . ED (erectile dysfunction)   . Complication of anesthesia     makes him loopy  . Hypertension   . Atrial flutter     s/p RFCA 01/05/13  . Arthritis     back & knees    ROS:   All systems reviewed and negative except as noted in the HPI.   Past Surgical History   Procedure Laterality Date  . Colonoscopy  11/2009    Dr. Benson Norway  . Arthroscopic repair acl    . Rotator cuff repair    . Ablation of dysrhythmic focus  01/05/2013  . Atrial flutter ablation N/A 01/05/2013    Procedure: ATRIAL FLUTTER ABLATION;  Surgeon: Evans Lance, MD;  Location: [facility] CATH LAB;  Service: Cardiovascular;  Laterality: N/A;     Family History  Problem Relation Age of Onset  . Valvular heart disease Mother   . Lymphoma Mother      History   Social History  . Marital Status: Married    Spouse Name: N/A  . Number of Children: N/A  . Years of Education: N/A   Occupational History  . Not on file.   Social History Main Topics  . Smoking status: Former Smoker    Quit date: 01/06/1988  . Smokeless tobacco: Former Systems developer    Quit date: 03/05/2012  . Alcohol Use: Yes     Comment: wine daily  . Drug Use: No  . Sexual Activity: Not on file   Other Topics Concern  . Not on file   Social History Narrative     BP 124/82 mmHg  Pulse 62  Ht 5' 11"  (1.803 m)  Wt 233 lb 6.4 oz (105.87 kg)  BMI 32.57  kg/m2  Physical Exam:  Well appearing middle-aged man,NAD HEENT: Unremarkable Neck:  No JVD, no thyromegally Back:  No CVA tenderness Lungs:  Clear with no wheezes, rales, or rhonchi. HEART:  Regular rate rhythm, no murmurs, no rubs, no clicks Abd:  soft, positive bowel sounds, no organomegally, no rebound, no guarding Ext:  2 plus pulses, no edema, no cyanosis, no clubbing Skin:  No rashes no nodules Neuro:  CN II through XII intact, motor grossly intact  EKG - normal sinus rhythm with incomplete right bundle branch block.  Assess/Plan:

## 2015-06-11 NOTE — Patient Instructions (Addendum)
Medication Instructions:  Your physician recommends that you continue on your current medications as directed. Please refer to the Current Medication list given to you today.   Labwork: Your physician recommends that you return for lab work on 06/27/15  You do not have to be fasting   Testing/Procedures: Your physician has recommended that you have an ablation. Catheter ablation is a medical procedure used to treat some cardiac arrhythmias (irregular heartbeats). During catheter ablation, a long, thin, flexible tube is put into a blood vessel in your groin (upper thigh), or neck. This tube is called an ablation catheter. It is then guided to your heart through the blood vessel. Radio frequency waves destroy small areas of heart tissue where abnormal heartbeats may cause an arrhythmia to start. Please see the instruction sheet given to you today.    Follow-Up: Your physician recommends that you schedule a follow-up appointment in: 4 weeks from 07/04/15 with Dr [name]   Any Other Special Instructions Will Be Listed Below (If Applicable).  Cardiac Ablation Cardiac ablation is a procedure to disable a small amount of heart tissue in very specific places. The heart has many electrical connections. Sometimes these connections are abnormal and can cause the heart to beat very fast or irregularly. By disabling some of the problem areas, heart rhythm can be improved or made normal. Ablation is done for people who:   Have Wolff-Parkinson-White syndrome.   Have other fast heart rhythms (tachycardia).   Have taken medicines for an abnormal heart rhythm (arrhythmia) that resulted in:   No success.   Side effects.   May have a high-risk heartbeat that could result in death.  LET [facility] CARE PROVIDER KNOW ABOUT:   Any allergies you have or any previous reactions you have had to X-ray dye, food (such as seafood), medicine, or tape.   All medicines you are taking, including vitamins,  herbs, eye drops, creams, and over-the-counter medicines.   Previous problems you or members of your family have had with the use of anesthetics.   Any blood disorders you have.   Previous surgeries or procedures (such as a kidney transplant) you have had.   Medical conditions you have (such as kidney failure).  RISKS AND COMPLICATIONS Generally, cardiac ablation is a safe procedure. However, problems can occur and include:   Increased risk of cancer. Depending on how long it takes to do the ablation, the dose of radiation can be high.  Bruising and bleeding where a thin, flexible tube (catheter) was inserted during the procedure.   Bleeding into the chest, especially into the sac that surrounds the heart (serious).  Need for a permanent pacemaker if the normal electrical system is damaged.   The procedure may not be fully effective, and this may not be recognized for months. Repeat ablation procedures are sometimes required. BEFORE THE PROCEDURE   Follow any instructions from your health care provider regarding eating and drinking before the procedure.   Take your medicines as directed at regular times with water, unless instructed otherwise by your health care provider. If you are taking diabetes medicine, including insulin, ask how you are to take it and if there are any special instructions you should follow. It is common to adjust insulin dosing the day of the ablation.  PROCEDURE  An ablation is usually performed in a catheterization laboratory with the guidance of fluoroscopy. Fluoroscopy is a type of X-ray that helps your health care provider see images of your heart during the procedure.  An ablation is a minimally invasive procedure. This means a small cut (incision) is made in either your neck or groin. Your health care provider will decide where to make the incision based on your medical history and physical exam.  An IV tube will be started before the  procedure begins. You will be given an anesthetic or medicine to help you relax (sedative).  The skin on your neck or groin will be numbed. A needle will be inserted into a large vein in your neck or groin and catheters will be threaded to your heart.  A special dye that shows up on fluoroscopy pictures may be injected through the catheter. The dye helps your health care provider see the area of the heart that needs treatment.  The catheter has electrodes on the tip. When the area of heart tissue that is causing the arrhythmia is found, the catheter tip will send an electrical current to the area and "scar" the tissue. Three types of energy can be used to ablate the heart tissue:   Heat (radiofrequency energy).   Laser energy.   Extreme cold (cryoablation).   When the area of the heart has been ablated, the catheter will be taken out. Pressure will be held on the insertion site. This will help the insertion site clot and keep it from bleeding. A bandage will be placed on the insertion site.  AFTER THE PROCEDURE   After the procedure, you will be taken to a recovery area where your vital signs (blood pressure, heart rate, and breathing) will be monitored. The insertion site will also be monitored for bleeding.   You will need to lie still for 4-6 hours. This is to ensure you do not bleed from the catheter insertion site.  Document Released: 04/03/2009 Document Revised: 04/01/2014 Document Reviewed: 04/09/2013 [facility] Patient Information 2015 [place], [place]. This information is not intended to replace advice given to you by your health care provider. Make sure you discuss any questions you have with your health care provider.

## 2015-06-13 ENCOUNTER — Telehealth: Payer: Self-pay | Admitting: Internal Medicine

## 2015-06-13 NOTE — Telephone Encounter (Signed)
New message  Pt called req a call back to resch the surgery.

## 2015-06-13 NOTE — Telephone Encounter (Signed)
He is going to keep date as scheduled

## 2015-06-13 NOTE — Telephone Encounter (Signed)
Left message on voicemail to return my call.  I also let him know it would be the end of Aug to Sept before we can reschedule.

## 2015-06-27 ENCOUNTER — Other Ambulatory Visit (INDEPENDENT_AMBULATORY_CARE_PROVIDER_SITE_OTHER): Payer: BLUE CROSS/BLUE SHIELD | Admitting: *Deleted

## 2015-06-27 DIAGNOSIS — I484 Atypical atrial flutter: Secondary | ICD-10-CM

## 2015-06-27 LAB — BASIC METABOLIC PANEL
BUN: 11 mg/dL (ref 6–23)
CALCIUM: 9.1 mg/dL (ref 8.4–10.5)
CHLORIDE: 102 meq/L (ref 96–112)
CO2: 28 mEq/L (ref 19–32)
Creatinine, Ser: 0.81 mg/dL (ref 0.40–1.50)
GFR: 102.45 mL/min (ref >60.00)
Glucose, Bld: 82 mg/dL (ref 70–99)
POTASSIUM: 3.8 meq/L (ref 3.5–5.1)
SODIUM: 139 meq/L (ref 135–145)

## 2015-06-27 LAB — CBC WITH DIFFERENTIAL/PLATELET
BASOS PCT: 0.4 % (ref 0.0–3.0)
Basophils Absolute: 0 10*3/uL (ref 0.0–0.1)
EOS PCT: 2.1 % (ref 0.0–5.0)
Eosinophils Absolute: 0.1 10*3/uL (ref 0.0–0.7)
HEMATOCRIT: 39.9 % (ref 39.0–52.0)
HEMOGLOBIN: 13.7 g/dL (ref 13.0–17.0)
Lymphocytes Relative: 26.5 % (ref 12.0–46.0)
Lymphs Abs: 1.3 10*3/uL (ref 0.7–4.0)
MCHC: 34.3 g/dL (ref 30.0–36.0)
MCV: 102.7 fl — ABNORMAL HIGH (ref 78.0–100.0)
Monocytes Absolute: 0.3 10*3/uL (ref 0.1–1.0)
Monocytes Relative: 6.8 % (ref 3.0–12.0)
Neutro Abs: 3.3 10*3/uL (ref 1.4–7.7)
Neutrophils Relative %: 64.2 % (ref 43.0–77.0)
Platelets: 153 10*3/uL (ref 150.0–400.0)
RBC: 3.89 Mil/uL — ABNORMAL LOW (ref 4.22–5.81)
RDW: 13.5 % (ref 11.5–15.5)
WBC: 5.1 10*3/uL (ref 4.0–10.5)

## 2015-07-03 ENCOUNTER — Telehealth: Payer: Self-pay | Admitting: Internal Medicine

## 2015-07-03 NOTE — Telephone Encounter (Signed)
New message      Pt saw PCP.  He does not have a fever or a virus.  Ablation is scheduled for tomorrow.

## 2015-07-03 NOTE — Telephone Encounter (Signed)
New message    Pt is to have procedure tomorrow but has developed a sore throat Please call to discuss

## 2015-07-03 NOTE — Telephone Encounter (Signed)
Discussed with Dr [name]  As long as afebrile okay to proceed  Patient aware

## 2015-07-03 NOTE — Telephone Encounter (Signed)
Dr [name] aware and will proceed as scheduled

## 2015-07-04 ENCOUNTER — Ambulatory Visit (HOSPITAL_COMMUNITY)
Admission: RE | Admit: 2015-07-04 | Discharge: 2015-07-05 | Disposition: A | Discharge status: Home / Self Care | Payer: BLUE CROSS/BLUE SHIELD | Source: Ambulatory Visit | Attending: Internal Medicine | Admitting: Internal Medicine

## 2015-07-04 ENCOUNTER — Ambulatory Visit (HOSPITAL_COMMUNITY): Payer: BLUE CROSS/BLUE SHIELD | Admitting: Certified Registered Nurse Anesthetist

## 2015-07-04 ENCOUNTER — Encounter (HOSPITAL_COMMUNITY)
Admission: RE | Disposition: A | Discharge status: Home / Self Care | Payer: Self-pay | Source: Ambulatory Visit | Attending: Internal Medicine

## 2015-07-04 ENCOUNTER — Encounter (HOSPITAL_COMMUNITY): Payer: Self-pay | Admitting: Certified Registered Nurse Anesthetist

## 2015-07-04 DIAGNOSIS — I471 Supraventricular tachycardia, unspecified: Secondary | ICD-10-CM | POA: Diagnosis present

## 2015-07-04 DIAGNOSIS — I4892 Unspecified atrial flutter: Secondary | ICD-10-CM | POA: Diagnosis not present

## 2015-07-04 DIAGNOSIS — G4730 Sleep apnea, unspecified: Secondary | ICD-10-CM | POA: Insufficient documentation

## 2015-07-04 DIAGNOSIS — Z87891 Personal history of nicotine dependence: Secondary | ICD-10-CM | POA: Diagnosis not present

## 2015-07-04 DIAGNOSIS — I739 Peripheral vascular disease, unspecified: Secondary | ICD-10-CM | POA: Insufficient documentation

## 2015-07-04 DIAGNOSIS — F419 Anxiety disorder, unspecified: Secondary | ICD-10-CM | POA: Diagnosis not present

## 2015-07-04 DIAGNOSIS — I2510 Atherosclerotic heart disease of native coronary artery without angina pectoris: Secondary | ICD-10-CM | POA: Insufficient documentation

## 2015-07-04 DIAGNOSIS — Z8679 Personal history of other diseases of the circulatory system: Secondary | ICD-10-CM

## 2015-07-04 DIAGNOSIS — R000 Tachycardia, unspecified: Secondary | ICD-10-CM | POA: Diagnosis present

## 2015-07-04 DIAGNOSIS — Z7901 Long term (current) use of anticoagulants: Secondary | ICD-10-CM | POA: Diagnosis not present

## 2015-07-04 DIAGNOSIS — M1990 Unspecified osteoarthritis, unspecified site: Secondary | ICD-10-CM | POA: Insufficient documentation

## 2015-07-04 DIAGNOSIS — I10 Essential (primary) hypertension: Secondary | ICD-10-CM | POA: Insufficient documentation

## 2015-07-04 DIAGNOSIS — Z79899 Other long term (current) drug therapy: Secondary | ICD-10-CM | POA: Diagnosis not present

## 2015-07-04 DIAGNOSIS — Z9889 Other specified postprocedural states: Secondary | ICD-10-CM

## 2015-07-04 HISTORY — PX: ELECTROPHYSIOLOGIC STUDY: SHX172A

## 2015-07-04 SURGERY — A-FLUTTER/A-TACH/SVT ABLATION
Anesthesia: General

## 2015-07-04 MED ORDER — ONDANSETRON HCL 4 MG/2ML IJ SOLN
INTRAMUSCULAR | Status: DC | PRN
  Administered 2015-07-04: 4 mg via INTRAVENOUS

## 2015-07-04 MED ORDER — METOPROLOL SUCCINATE ER 25 MG PO TB24
25.0000 mg | ORAL_TABLET | Freq: Two times a day (BID) | ORAL | Status: DC
  Administered 2015-07-04 – 2015-07-05 (×3): 25 mg via ORAL
  Filled 2015-07-04 (×3): qty 1

## 2015-07-04 MED ORDER — SODIUM CHLORIDE 0.9 % IV SOLN: 250.0000 mL | INTRAVENOUS | Status: DC | PRN

## 2015-07-04 MED ORDER — LOSARTAN POTASSIUM 50 MG PO TABS
100.0000 mg | ORAL_TABLET | Freq: Every day | ORAL | Status: DC
  Administered 2015-07-04 – 2015-07-05 (×2): 100 mg via ORAL
  Filled 2015-07-04 (×2): qty 2

## 2015-07-04 MED ORDER — BUPIVACAINE HCL (PF) 0.25 % IJ SOLN
INTRAMUSCULAR | Status: DC | PRN
  Administered 2015-07-04: 50 mL

## 2015-07-04 MED ORDER — SODIUM CHLORIDE 0.9 % IJ SOLN
3.0000 mL | Freq: Two times a day (BID) | INTRAMUSCULAR | Status: DC
  Administered 2015-07-04: 3 mL via INTRAVENOUS

## 2015-07-04 MED ORDER — SODIUM CHLORIDE 0.9 % IV SOLN
INTRAVENOUS | Status: DC | PRN
  Administered 2015-07-04 (×2): via INTRAVENOUS

## 2015-07-04 MED ORDER — MIDAZOLAM HCL 5 MG/5ML IJ SOLN
INTRAMUSCULAR | Status: DC | PRN
  Administered 2015-07-04: 2 mg via INTRAVENOUS

## 2015-07-04 MED ORDER — BUPIVACAINE HCL (PF) 0.25 % IJ SOLN
INTRAMUSCULAR | Status: AC
  Filled 2015-07-04: qty 30

## 2015-07-04 MED ORDER — PHENYLEPHRINE HCL 10 MG/ML IJ SOLN
INTRAMUSCULAR | Status: DC | PRN
  Administered 2015-07-04: 80 ug via INTRAVENOUS
  Administered 2015-07-04: 40 ug via INTRAVENOUS
  Administered 2015-07-04 (×2): 80 ug via INTRAVENOUS

## 2015-07-04 MED ORDER — ONDANSETRON HCL 4 MG/2ML IJ SOLN: 4.0000 mg | Freq: Four times a day (QID) | INTRAMUSCULAR | Status: DC | PRN

## 2015-07-04 MED ORDER — FENTANYL CITRATE (PF) 100 MCG/2ML IJ SOLN
INTRAMUSCULAR | Status: DC | PRN
  Administered 2015-07-04 (×2): 50 ug via INTRAVENOUS
  Administered 2015-07-04: 25 ug via INTRAVENOUS
  Administered 2015-07-04: 50 ug via INTRAVENOUS
  Administered 2015-07-04 (×3): 25 ug via INTRAVENOUS

## 2015-07-04 MED ORDER — ZOLPIDEM TARTRATE 5 MG PO TABS
5.0000 mg | ORAL_TABLET | Freq: Every evening | ORAL | Status: DC | PRN
  Administered 2015-07-04: 5 mg via ORAL
  Filled 2015-07-04: qty 1

## 2015-07-04 MED ORDER — ALPRAZOLAM 0.25 MG PO TABS: 0.2500 mg | ORAL_TABLET | Freq: Every evening | ORAL | Status: DC | PRN

## 2015-07-04 MED ORDER — PROPOFOL 10 MG/ML IV BOLUS
INTRAVENOUS | Status: DC | PRN
  Administered 2015-07-04: 200 mg via INTRAVENOUS
  Administered 2015-07-04: 70 mg via INTRAVENOUS

## 2015-07-04 MED ORDER — LIDOCAINE HCL (CARDIAC) 20 MG/ML IV SOLN
INTRAVENOUS | Status: DC | PRN
  Administered 2015-07-04: 80 mg via INTRAVENOUS

## 2015-07-04 MED ORDER — ACETAMINOPHEN 325 MG PO TABS: 650.0000 mg | ORAL_TABLET | ORAL | Status: DC | PRN

## 2015-07-04 MED ORDER — SODIUM CHLORIDE 0.9 % IJ SOLN: 3.0000 mL | INTRAMUSCULAR | Status: DC | PRN

## 2015-07-04 SURGICAL SUPPLY — 16 items
BAG SNAP BAND KOVER 36X36 (MISCELLANEOUS) ×2 IMPLANT
BLANKET WARM UNDERBOD FULL ACC (MISCELLANEOUS) ×2 IMPLANT
CATH EZ STEER THERMO NAV F-J (ABLATOR) ×2 IMPLANT
CATH HEX JOSEPH 2-5-2 65CM 6F (CATHETERS) IMPLANT
CATH JOSEPH QUAD ALLRED 6F REP (CATHETERS) ×2 IMPLANT
CATH JOSEPHSON QUAD-ALLRED 6FR (CATHETERS) IMPLANT
CATH POLARIS X 2.5/5/2.5 DECAP (CATHETERS) ×2 IMPLANT
PACK EP LATEX FREE (CUSTOM PROCEDURE TRAY) ×1
PACK EP LF (CUSTOM PROCEDURE TRAY) ×1 IMPLANT
PAD DEFIB LIFELINK (PAD) ×2 IMPLANT
PATCH CARTO3 (PAD) ×2 IMPLANT
SHEATH PINNACLE 6F 10CM (SHEATH) ×4 IMPLANT
SHEATH PINNACLE 7F 10CM (SHEATH) IMPLANT
SHEATH PINNACLE 8F 10CM (SHEATH) ×2 IMPLANT
SHIELD RADPAD SCOOP 12X17 (MISCELLANEOUS) ×2 IMPLANT
TUBING SMART ABLATE COOLFLOW (TUBING) ×2 IMPLANT

## 2015-07-04 NOTE — Progress Notes (Signed)
Three sheaths pulled from right femoral vein. Manual pressure held for 20 minutes. Gauze and tape applied to site. No bruising or hematoma present. Dressing is clean, dry and intact. Post procedure instructions provided to patient. Bed rest begins at 1305.

## 2015-07-04 NOTE — H&P (View-Only) (Signed)
HPI Lee Lee returns today for followup. He is a very pleasant 64 year old man with a history of atrial flutter, status post catheter ablation. He has had no recurrent palpitations or documented atrial arrhythmias until a few months ago.  He denies chest pain or shortness of breath. He does admit to dietary indiscretion with alcohol though he has tried to reduce his ETOH consumption. He has worn a cardiac monitor and found to have a recurrent tachycardia which may be atrial flutter vs atrial tachycardia. He was placed on eliquis and metoprolol and his symptoms initially improved. However over the past few weeks he has had increasingly worse palpitations. He has gotten where he cannot stop thinking about when his heart is going to race. He has not had syncope. No Known Allergies   Current Outpatient Prescriptions  Medication Sig Dispense Refill  . citalopram (CELEXA) 20 MG tablet Take 1 tablet (20 mg total) by mouth daily. 30 tablet 2  . ELIQUIS 5 MG TABS tablet TAKE 1 TABLET (5 MG TOTAL) BY MOUTH 2 (TWO) TIMES DAILY. 60 tablet 6  . losartan (COZAAR) 100 MG tablet TAKE 1 TABLET (100 MG TOTAL) BY MOUTH DAILY. 90 tablet 0  . metoprolol succinate (TOPROL-XL) 25 MG 24 hr tablet Take 1 tablet (25 mg total) by mouth 2 (two) times daily. Take with or immediately following a meal. 180 tablet 3  . PARoxetine (PAXIL) 10 MG tablet Take 1 tablet (10 mg total) by mouth daily. 30 tablet 0  . zolpidem (AMBIEN) 10 MG tablet Take 5 mg by mouth at bedtime as needed for sleep (sleep).     No current facility-administered medications for this visit.     Past Medical History  Diagnosis Date  . Calcium oxalate renal stones   . Hemorrhoids   . ED (erectile dysfunction)   . Complication of anesthesia     makes him loopy  . Hypertension   . Atrial flutter     s/p RFCA 01/05/13  . Arthritis     back & knees    ROS:   All systems reviewed and negative except as noted in the HPI.   Past Surgical History   Procedure Laterality Date  . Colonoscopy  11/2009    Dr. Benson Norway  . Arthroscopic repair acl    . Rotator cuff repair    . Ablation of dysrhythmic focus  01/05/2013  . Atrial flutter ablation N/A 01/05/2013    Procedure: ATRIAL FLUTTER ABLATION;  Surgeon: Evans Lance, MD;  Location[facility] CATH LAB;  Service: Cardiovascular;  Laterality: N/A;     Family History  Problem Relation Age of Onset  . Valvular heart disease Mother   . Lymphoma Mother      History   Social History  . Marital Status: Married    Spouse Name: N/A  . Number of Children: N/A  . Years of Education: N/A   Occupational History  . Not on file.   Social History Main Topics  . Smoking status: Former Smoker    Quit date: 01/06/1988  . Smokeless tobacco: Former Systems developer    Quit date: 03/05/2012  . Alcohol Use: Yes     Comment: wine daily  . Drug Use: No  . Sexual Activity: Not on file   Other Topics Concern  . Not on file   Social History Narrative     BP 124/82 mmHg  Pulse 62  Ht 5' 11"  (1.803 m)  Wt 233 lb 6.4 oz (105.87 kg)  BMI 32.57  kg/m2  Physical Exam:  Well appearing middle-aged man,NAD HEENT: Unremarkable Neck:  No JVD, no thyromegally Back:  No CVA tenderness Lungs:  Clear with no wheezes, rales, or rhonchi. HEART:  Regular rate rhythm, no murmurs, no rubs, no clicks Abd:  soft, positive bowel sounds, no organomegally, no rebound, no guarding Ext:  2 plus pulses, no edema, no cyanosis, no clubbing Skin:  No rashes no nodules Neuro:  CN II through XII intact, motor grossly intact  EKG - normal sinus rhythm with incomplete right bundle branch block.  Assess/Plan:

## 2015-07-04 NOTE — Anesthesia Postprocedure Evaluation (Signed)
  Anesthesia Post-op Note  Patient: Lee Lee  Procedure(s) Performed: Procedure(s): A-Flutter Ablation (N/A)  Patient Location: PACU and Endoscopy Unit  Anesthesia Type:GA  Level of Consciousness: awake  Airway and Oxygen Therapy: Patient Spontanous Breathing  Post-op Pain: mild  Post-op Assessment: Post-op Vital signs reviewed              Post-op Vital Signs: Reviewed  Last Vitals:  Filed Vitals:   07/04/15 0836  BP: 158/90  Pulse: 57  Temp: 36.7 C  Resp: 18    Complications: No apparent anesthesia complications

## 2015-07-04 NOTE — Interval H&P Note (Signed)
History and Physical Interval Note:  07/04/2015 9:21 AM  Lee Lee  has presented today for surgery, with the diagnosis of atrial flutter  The various methods of treatment have been discussed with the patient and family. After consideration of risks, benefits and other options for treatment, the patient has consented to  Procedure(s): A-Flutter Ablation (N/A) as a surgical intervention .  The patient's history has been reviewed, patient examined, no change in status, stable for surgery.  I have reviewed the patient's chart and labs.  Questions were answered to the patient's satisfaction.     Cristopher Peru

## 2015-07-04 NOTE — Anesthesia Procedure Notes (Signed)
Procedure Name: LMA Insertion Date/Time: 07/04/2015 10:31 AM Performed by: Merdis Delay Pre-anesthesia Checklist: Patient identified, Timeout performed, Emergency Drugs available, Suction available and Patient being monitored Patient Re-evaluated:Patient Re-evaluated prior to inductionOxygen Delivery Method: Circle system utilized Preoxygenation: Pre-oxygenation with 100% oxygen Intubation Type: IV induction Ventilation: Mask ventilation without difficulty and Two handed mask ventilation required LMA: LMA inserted LMA Size: 5.0 Number of attempts: 1 Placement Confirmation: positive ETCO2,  CO2 detector and breath sounds checked- equal and bilateral Tube secured with: Tape Dental Injury: Teeth and Oropharynx as per pre-operative assessment

## 2015-07-04 NOTE — Anesthesia Preprocedure Evaluation (Addendum)
Anesthesia Evaluation  Patient identified by MRN, date of birth, ID band Patient awake    Reviewed: Allergy & Precautions, NPO status , Patient's Chart, lab work & pertinent test results  Airway Mallampati: II  TM Distance: >3 FB Neck ROM: Full    Dental  (+) Teeth Intact, Dental Advisory Given,    Pulmonary sleep apnea , former smoker,  breath sounds clear to auscultation        Cardiovascular hypertension, + CAD and + Peripheral Vascular Disease Rhythm:Regular Rate:Normal  ascending AA- 4.2 cm- pt denies SOB/CP   Neuro/Psych Anxiety negative neurological ROS     GI/Hepatic negative GI ROS, Neg liver ROS,   Endo/Other    Renal/GU Renal diseasenegative Renal ROS     Musculoskeletal  (+) Arthritis -,   Abdominal   Peds  Hematology   Anesthesia Other Findings   Reproductive/Obstetrics                        Anesthesia Physical Anesthesia Plan  ASA: III  Anesthesia Plan: General   Post-op Pain Management:    Induction: Intravenous  Airway Management Planned: LMA  Additional Equipment:   Intra-op Plan:   Post-operative Plan: Extubation in OR  Informed Consent: I have reviewed the patients History and Physical, chart, labs and discussed the procedure including the risks, benefits and alternatives for the proposed anesthesia with the patient or authorized representative who has indicated his/her understanding and acceptance.   Dental advisory given  Plan Discussed with: CRNA, Anesthesiologist and Surgeon  Anesthesia Plan Comments:        Anesthesia Quick Evaluation

## 2015-07-04 NOTE — Transfer of Care (Signed)
Immediate Anesthesia Transfer of Care Note  Patient: Lee Lee  Procedure(s) Performed: Procedure(s): A-Flutter Ablation (N/A)  Patient Location: PACU  Anesthesia Type:General  Level of Consciousness: awake, alert , oriented and patient cooperative  Airway & Oxygen Therapy: Patient Spontanous Breathing and Patient connected to nasal cannula oxygen  Post-op Assessment: Report given to RN, Post -op Vital signs reviewed and stable, Patient moving all extremities, Patient moving all extremities X 4 and Patient able to stick tongue midline  Post vital signs: Reviewed and stable  Last Vitals:  Filed Vitals:   07/04/15 0836  BP: 158/90  Pulse: 57  Temp: 36.7 C  Resp: 18    Complications: No apparent anesthesia complications

## 2015-07-05 DIAGNOSIS — I4892 Unspecified atrial flutter: Secondary | ICD-10-CM | POA: Diagnosis not present

## 2015-07-05 DIAGNOSIS — Z7901 Long term (current) use of anticoagulants: Secondary | ICD-10-CM

## 2015-07-05 DIAGNOSIS — I471 Supraventricular tachycardia: Secondary | ICD-10-CM

## 2015-07-05 DIAGNOSIS — Z9889 Other specified postprocedural states: Secondary | ICD-10-CM

## 2015-07-05 DIAGNOSIS — Z79899 Other long term (current) drug therapy: Secondary | ICD-10-CM | POA: Diagnosis not present

## 2015-07-05 DIAGNOSIS — Z8679 Personal history of other diseases of the circulatory system: Secondary | ICD-10-CM

## 2015-07-05 MED ORDER — APIXABAN 5 MG PO TABS: 5.0000 mg | ORAL_TABLET | Freq: Two times a day (BID) | ORAL | Status: DC

## 2015-07-05 MED ORDER — ACETAMINOPHEN 325 MG PO TABS: 650.0000 mg | ORAL_TABLET | ORAL | Status: DC | PRN

## 2015-07-05 NOTE — Discharge Summary (Signed)
Physician Discharge Summary       Patient ID: Lee Lee MRN: [id number] DOB/AGE: [birth year] 64 y.o.  Admit date: 07/04/2015 Discharge date: 07/05/2015 Primary Cardiologist:Dr. Aundra Dubin EP: Dr. [name]   Discharge Diagnoses:  Principal Problem:   SVT (supraventricular tachycardia) Active Problems:   S/P radiofrequency ablation operation for arrhythmia 07/04/15   Anticoagulation adequate   Discharged Condition: good  Procedures: 07/04/15: Successful catheter ablation of a focal atrial tachycardia originating just adjacent to the AV node. 5 radiofrequency energy applications resulted in rendering the tachycardia noninducible. In addition there is evidence of atrial flutter isthmus conduction. 10 radiofrequency energy applications were delivered to the atrial flutter isthmus resulting in the creation of atrial flutter isthmus block by Dr. [name].   Hospital Course:  64 year old man with a history of atrial flutter, status post catheter ablation. He had had no recurrent palpitations or documented atrial arrhythmias until a few months ago. He denied chest pain or shortness of breath. He did admit to dietary indiscretion with alcohol though he has tried to reduce his ETOH consumption. He has worn a cardiac monitor and found to have a recurrent tachycardia which may be atrial flutter vs atrial tachycardia. He was placed on eliquis and metoprolol and his symptoms initially improved. However over the past few weeks before admit he has had increasingly worse palpitations. He has gotten where he cannot stop thinking about when his heart is going to race. He has not had syncope. He was seen and evaluated by Dr. [name] and plans were made for CARTO guided ablation of atrial flutter.  He was admitted electively 07/04/15 and underwent the above procedure-successful albaton of parahisain tachycardia.  He tolerated well.    Today was seen and found stable for discharge by Dr. Caryl Comes.  He will resume his Eliquis  in 3 days.  He will follow up with Dr. [name] as arranged.  Consults: None  Significant Diagnostic Studies:  BMP Latest Ref Rng 06/27/2015 06/25/2014 05/20/2014  Glucose 70 - 99 mg/dL 82 91 117(H)  BUN 6 - 23 mg/dL 11 15 17   Creatinine 0.40 - 1.50 mg/dL 0.81 1.0 0.8  Sodium 135 - 145 mEq/L 139 139 141  Potassium 3.5 - 5.1 mEq/L 3.8 4.5 3.6  Chloride 96 - 112 mEq/L 102 106 103  CO2 19 - 32 mEq/L 28 28 29   Calcium 8.4 - 10.5 mg/dL 9.1 9.2 9.4      Discharge Exam: Blood pressure 135/82, pulse 72, temperature 98.5 F (36.9 C), temperature source Oral, resp. rate 18, height 5' 11"  (1.803 m), weight 233 lb 8 oz (105.915 kg), SpO2 98 %.   Disposition: 01-Home or Self Care     Medication List    STOP taking these medications        citalopram 20 MG tablet  Commonly known as:  CELEXA     PARoxetine 10 MG tablet  Commonly known as:  PAXIL      TAKE these medications        acetaminophen 325 MG tablet  Commonly known as:  TYLENOL  Take 2 tablets (650 mg total) by mouth every 4 (four) hours as needed for headache or mild pain.     ALPRAZolam 0.25 MG tablet  Commonly known as:  XANAX  Take 0.25 mg by mouth at bedtime as needed for anxiety.     apixaban 5 MG Tabs tablet  Commonly known as:  ELIQUIS  Take 1 tablet (5 mg total) by mouth 2 (two) times daily.  Start taking  on:  07/08/2015     losartan 100 MG tablet  Commonly known as:  COZAAR  TAKE 1 TABLET (100 MG TOTAL) BY MOUTH DAILY.     metoprolol succinate 25 MG 24 hr tablet  Commonly known as:  TOPROL-XL  Take 1 tablet (25 mg total) by mouth 2 (two) times daily. Take with or immediately following a meal.     zolpidem 10 MG tablet  Commonly known as:  AMBIEN  Take 5 mg by mouth at bedtime as needed for sleep (sleep).       Follow-up Information    Follow up with Cristopher Peru, MD On 07/30/2015.   Specialty:  Cardiology   Contact information:   [address] [place] [address] [phone]         Please follow up.   Why:  at 8:15am       Discharge Instructions: Resume your Eliquis on the 9th of August.  Call [facility] at [phone] if any bleeding, swelling or drainage at cath site.  May shower, no tub baths for 48 hours for groin sticks. No lifting over 5 pounds for 5 days.  No Driving for 3 days  No strenous activity for 1 week No sexual activity for 1 week  Heart healthy diet      Signed: HYWVPX,TGGYI R Nurse Practitioner-Certified [facility]: [facility] 07/05/2015, 2:07 PM  Time spent on discharge :> 30 minutes.

## 2015-07-05 NOTE — Discharge Instructions (Signed)
Resume your Eliquis on the 9th of August.  Call [facility] at [phone] if any bleeding, swelling or drainage at cath site.  May shower, no tub baths for 48 hours for groin sticks. No lifting over 5 pounds for 5 days.  No Driving for 3 days  No strenous activity for 1 week No sexual activity for 1 week  Heart healthy diet

## 2015-07-05 NOTE — Progress Notes (Signed)
       Patient Name: Lee Lee      SUBJECTIVE: without complaint except that he spent the night  Past Medical History  Diagnosis Date  . Calcium oxalate renal stones   . Hemorrhoids   . ED (erectile dysfunction)   . Complication of anesthesia     makes him loopy  . Hypertension   . Atrial flutter     s/p RFCA 01/05/13  . Arthritis     back & knees    Scheduled Meds:  Scheduled Meds: . losartan  100 mg Oral Daily  . metoprolol succinate  25 mg Oral BID  . sodium chloride  3 mL Intravenous Q12H   Continuous Infusions:  sodium chloride, acetaminophen, ALPRAZolam, ondansetron (ZOFRAN) IV, sodium chloride, zolpidem    PHYSICAL EXAM Filed Vitals:   07/04/15 1515 07/04/15 2045 07/05/15 0000 07/05/15 0500  BP: 131/76 125/73 130/76 135/82  Pulse:  72 72   Temp:  99.2 F (37.3 C) 100.1 F (37.8 C) 98.5 F (36.9 C)  TempSrc:  Oral Oral Oral  Resp:  18 18 18   Height:      Weight:    233 lb 8 oz (105.915 kg)  SpO2:  98% 98% 98%   Well developed and nourished in no acute distress HENT normal Neck supple with JVP-flat Clear Regular rate and rhythm, no murmurs or gallops Abd-soft with active BS No Clubbing cyanosis edema Skin-warm and dry A & Oriented  Grossly normal sensory and motor function Groin ok   TELEMETRY: Reviewed telemetry pt in nsr*:    Intake/Output Summary (Last 24 hours) at 07/05/15 1152 Last data filed at 07/05/15 0847  Gross per 24 hour  Intake   1260 ml  Output      0 ml  Net   1260 ml    LABS: Basic Metabolic Panel: No results for input(s): NA, K, CL, CO2, GLUCOSE, BUN, CREATININE, CALCIUM, MG, PHOS in the last 168 hours. Cardiac Enzymes: No results for input(s): CKTOTAL, CKMB, CKMBINDEX, TROPONINI in the last 72 hours. CBC: No results for input(s): WBC, NEUTROABS, HGB, HCT, MCV, PLT in the last 168 hours. PROTIME: No results for input(s): LABPROT, INR in the last 72 hours. Liver Function Tests: No results for input(s): AST,  ALT, ALKPHOS, BILITOT, PROT, ALBUMIN in the last 72 hours. No results for input(s): LIPASE, AMYLASE in the last 72 hours. BNP: BNP (last 3 results) No results for input(s): BNP in the last 8760 hours.  ProBNP (last 3 results)    ASSESSMENT AND PLAN:  Active Problems:   SVT (supraventricular tachycardia)   Successful albaton of parahisain tachycardia Discharge to ome Instructions given Signed, Virl Axe MD  07/05/2015

## 2015-07-07 ENCOUNTER — Telehealth: Payer: Self-pay | Admitting: Internal Medicine

## 2015-07-07 ENCOUNTER — Encounter (HOSPITAL_COMMUNITY): Payer: Self-pay | Admitting: Internal Medicine

## 2015-07-07 NOTE — Telephone Encounter (Signed)
He hasn't felt as good as he does in 9 months and just wanted to send thanks to Dr [name]

## 2015-07-07 NOTE — Telephone Encounter (Signed)
New message    Pt states he wanted to let you know he is not having any problems Please call to discuss

## 2015-07-21 ENCOUNTER — Other Ambulatory Visit: Payer: Self-pay | Admitting: Family Medicine

## 2015-07-21 DIAGNOSIS — R3129 Other microscopic hematuria: Secondary | ICD-10-CM

## 2015-07-23 ENCOUNTER — Ambulatory Visit
Admission: RE | Admit: 2015-07-23 | Discharge: 2015-07-23 | Disposition: A | Discharge status: Home / Self Care | Payer: BLUE CROSS/BLUE SHIELD | Source: Ambulatory Visit | Attending: Family Medicine | Admitting: Family Medicine

## 2015-07-23 ENCOUNTER — Other Ambulatory Visit: Payer: Self-pay | Admitting: Family Medicine

## 2015-07-23 ENCOUNTER — Other Ambulatory Visit: Payer: BLUE CROSS/BLUE SHIELD

## 2015-07-23 DIAGNOSIS — R8290 Unspecified abnormal findings in urine: Secondary | ICD-10-CM

## 2015-07-23 DIAGNOSIS — R3129 Other microscopic hematuria: Secondary | ICD-10-CM

## 2015-07-23 MED ORDER — IOPAMIDOL (ISOVUE-300) INJECTION 61%
125.0000 mL | Freq: Once | INTRAVENOUS | Status: AC | PRN
  Administered 2015-07-23: 125 mL via INTRAVENOUS

## 2015-07-25 ENCOUNTER — Other Ambulatory Visit: Payer: BLUE CROSS/BLUE SHIELD

## 2015-07-30 ENCOUNTER — Encounter: Payer: Self-pay | Admitting: Internal Medicine

## 2015-07-30 ENCOUNTER — Ambulatory Visit (INDEPENDENT_AMBULATORY_CARE_PROVIDER_SITE_OTHER): Payer: BLUE CROSS/BLUE SHIELD | Admitting: Internal Medicine

## 2015-07-30 VITALS — BP 148/98 | HR 61 | Ht 71.0 in | Wt 233.0 lb

## 2015-07-30 DIAGNOSIS — I471 Supraventricular tachycardia: Secondary | ICD-10-CM | POA: Diagnosis not present

## 2015-07-30 DIAGNOSIS — I10 Essential (primary) hypertension: Secondary | ICD-10-CM | POA: Diagnosis not present

## 2015-07-30 NOTE — Assessment & Plan Note (Signed)
He is s/p ablation of atrial tachycardia and doing well with no recurrent arrhythmias. He will undergo watchful waiting.

## 2015-07-30 NOTE — Patient Instructions (Signed)
Medication Instructions: - no changes  Labwork: - none  Procedures/Testing: - none   Follow-Up: - Dr. [name] will see you back on an as needed basis.  Any Additional Special Instructions Will Be Listed Below (If Applicable). - none

## 2015-07-30 NOTE — Assessment & Plan Note (Signed)
His blood pressure is elevated but has been better at home. He will follow up with his primary MD.

## 2015-07-30 NOTE — Progress Notes (Signed)
HPI Lee Lee returns today for followup. He is a very pleasant 64 year old man with a history of atrial flutter, status post catheter ablation. 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 He has had no recurrent palpitations. He is a month away from retirement. No other complaints.   No Known Allergies   Current Outpatient Prescriptions  Medication Sig Dispense Refill  . ALPRAZolam (XANAX) 0.25 MG tablet Take 0.25 mg by mouth at bedtime as needed for anxiety.    Marland Kitchen apixaban (ELIQUIS) 5 MG TABS tablet Take 1 tablet (5 mg total) by mouth 2 (two) times daily. 60 tablet 6  . losartan (COZAAR) 100 MG tablet TAKE 1 TABLET (100 MG TOTAL) BY MOUTH DAILY. 90 tablet 0  . metoprolol succinate (TOPROL-XL) 25 MG 24 hr tablet Take 1 tablet (25 mg total) by mouth 2 (two) times daily. Take with or immediately following a meal. 180 tablet 3  . PRIMIDONE PO 25 mg. Take two tablets by mouth every morning    . zolpidem (AMBIEN) 10 MG tablet Take 5 mg by mouth at bedtime as needed for sleep (sleep).     No current facility-administered medications for this visit.     Past Medical History  Diagnosis Date  . Calcium oxalate renal stones   . Hemorrhoids   . ED (erectile dysfunction)   . Complication of anesthesia     makes him loopy  . Hypertension   . Atrial flutter     s/p RFCA 01/05/13  . Arthritis     back & knees    ROS:   All systems reviewed and negative except as noted in the HPI.   Past Surgical History  Procedure Laterality Date  . Colonoscopy  11/2009    Dr. Benson Norway  . Arthroscopic repair acl    . Rotator cuff repair    . Ablation of dysrhythmic focus  01/05/2013  . Atrial flutter ablation N/A 01/05/2013    Procedure: ATRIAL FLUTTER ABLATION;  Surgeon: Evans Lance, MD;  Location: [facility] CATH LAB;  Service: Cardiovascular;  Laterality: N/A;  .  Electrophysiologic study N/A 07/04/2015    Procedure: A-Flutter Ablation;  Surgeon: Evans Lance, MD;  Location: [facility] CV LAB;  Service: Cardiovascular;  Laterality: N/A;     Family History  Problem Relation Age of Onset  . Valvular heart disease Mother   . Lymphoma Mother      Social History   Social History  . Marital Status: Married    Spouse Name: N/A  . Number of Children: N/A  . Years of Education: N/A   Occupational History  . Not on file.   Social History Main Topics  . Smoking status: Former Smoker    Quit date: 01/06/1988  . Smokeless tobacco: Former Systems developer    Quit date: 03/05/2012  . Alcohol Use: Yes     Comment: wine daily  . Drug Use: No  . Sexual Activity: Not on file   Other Topics Concern  . Not on file   Social History Narrative     BP 148/98 mmHg  Pulse 61  Ht 5' 11"  (1.803 m)  Wt 233 lb (105.688 kg)  BMI 32.51 kg/m2  Physical Exam:  Well appearing middle-aged man,NAD HEENT: Unremarkable Neck:  6 cm JVD, no thyromegally Back:  No CVA tenderness Lungs:  Clear with no wheezes, rales, or rhonchi. HEART:  Regular rate rhythm, no murmurs, no rubs, no clicks Abd:  soft, positive bowel sounds, no organomegally, no rebound, no guarding Ext:  2 plus pulses, no edema, no cyanosis, no clubbing Skin:  No rashes no nodules Neuro:  CN II through XII intact, motor grossly intact  EKG - normal sinus rhythm with incomplete right bundle branch block.  Assess/Plan:

## 2015-08-15 ENCOUNTER — Encounter: Payer: Self-pay | Admitting: Family Medicine

## 2015-08-15 ENCOUNTER — Other Ambulatory Visit: Payer: Self-pay

## 2015-08-15 MED ORDER — ZOLPIDEM TARTRATE 10 MG PO TABS: 5.0000 mg | ORAL_TABLET | Freq: Every evening | ORAL | Status: DC | PRN

## 2015-08-15 MED ORDER — ALPRAZOLAM 0.25 MG PO TABS: 0.2500 mg | ORAL_TABLET | Freq: Every evening | ORAL | Status: DC | PRN

## 2015-08-22 ENCOUNTER — Telehealth: Payer: Self-pay

## 2015-08-22 ENCOUNTER — Other Ambulatory Visit: Payer: Self-pay | Admitting: Cardiology

## 2015-08-22 NOTE — Telephone Encounter (Signed)
Mclean. He has seen Dr [name] recently. I explained he should make follow up visit before getting refill and he did not want to. He would like a call back from Nurse.

## 2015-08-24 ENCOUNTER — Other Ambulatory Visit: Payer: Self-pay | Admitting: Cardiology

## 2015-08-25 NOTE — Telephone Encounter (Signed)
losartan (COZAAR) 100 MG tablet TAKE 1 TABLET (100 MG TOTAL) BY MOUTH DAILY         Patient Instructions     Medication Instructions: - no changes

## 2015-09-25 ENCOUNTER — Encounter: Payer: Self-pay | Admitting: Physician Assistant

## 2015-09-25 ENCOUNTER — Other Ambulatory Visit: Payer: Self-pay

## 2015-09-25 MED ORDER — LOSARTAN POTASSIUM 100 MG PO TABS: 100.0000 mg | ORAL_TABLET | Freq: Every day | ORAL | Status: DC

## 2015-10-10 ENCOUNTER — Other Ambulatory Visit: Payer: Self-pay | Admitting: Cardiology

## 2016-02-27 ENCOUNTER — Other Ambulatory Visit: Payer: Self-pay | Admitting: Family Medicine

## 2016-02-27 NOTE — Telephone Encounter (Signed)
Last OV 05/20/15 Last filled 07/29/2014

## 2016-03-10 ENCOUNTER — Other Ambulatory Visit: Payer: Self-pay | Admitting: Cardiology

## 2016-04-13 LAB — HM COLONOSCOPY

## 2016-06-15 ENCOUNTER — Other Ambulatory Visit: Payer: Self-pay | Admitting: Internal Medicine

## 2016-06-15 ENCOUNTER — Ambulatory Visit (INDEPENDENT_AMBULATORY_CARE_PROVIDER_SITE_OTHER): Payer: BLUE CROSS/BLUE SHIELD | Admitting: Internal Medicine

## 2016-06-15 ENCOUNTER — Other Ambulatory Visit (INDEPENDENT_AMBULATORY_CARE_PROVIDER_SITE_OTHER): Payer: BLUE CROSS/BLUE SHIELD

## 2016-06-15 ENCOUNTER — Encounter: Payer: Self-pay | Admitting: Internal Medicine

## 2016-06-15 VITALS — BP 162/92 | HR 60 | Temp 98.1°F | Resp 16 | Ht 70.0 in | Wt 222.0 lb

## 2016-06-15 DIAGNOSIS — E781 Pure hyperglyceridemia: Secondary | ICD-10-CM

## 2016-06-15 DIAGNOSIS — R739 Hyperglycemia, unspecified: Secondary | ICD-10-CM

## 2016-06-15 DIAGNOSIS — I10 Essential (primary) hypertension: Secondary | ICD-10-CM

## 2016-06-15 DIAGNOSIS — E785 Hyperlipidemia, unspecified: Secondary | ICD-10-CM | POA: Diagnosis not present

## 2016-06-15 DIAGNOSIS — I712 Thoracic aortic aneurysm, without rupture: Secondary | ICD-10-CM

## 2016-06-15 DIAGNOSIS — I7121 Aneurysm of the ascending aorta, without rupture: Secondary | ICD-10-CM

## 2016-06-15 DIAGNOSIS — F1010 Alcohol abuse, uncomplicated: Secondary | ICD-10-CM

## 2016-06-15 LAB — COMPREHENSIVE METABOLIC PANEL
ALBUMIN: 4.3 g/dL (ref 3.5–5.2)
ALK PHOS: 64 U/L (ref 39–117)
ALT: 16 U/L (ref 0–53)
AST: 23 U/L (ref 0–37)
BILIRUBIN TOTAL: 0.7 mg/dL (ref 0.2–1.2)
BUN: 13 mg/dL (ref 6–23)
CHLORIDE: 101 meq/L (ref 96–112)
CO2: 32 mEq/L (ref 19–32)
CREATININE: 0.95 mg/dL (ref 0.40–1.50)
Calcium: 9.8 mg/dL (ref 8.4–10.5)
GFR: 84.97 mL/min (ref >60.00)
GLUCOSE: 161 mg/dL — AB (ref 70–99)
POTASSIUM: 4.2 meq/L (ref 3.5–5.1)
Sodium: 140 mEq/L (ref 135–145)
TOTAL PROTEIN: 7.6 g/dL (ref 6.0–8.3)

## 2016-06-15 LAB — LIPID PANEL
CHOLESTEROL: 183 mg/dL (ref 0–200)
HDL: 88.3 mg/dL (ref >39.00)
LDL Cholesterol: 80 mg/dL (ref 0–99)
NonHDL: 94.57
Total CHOL/HDL Ratio: 2
Triglycerides: 73 mg/dL (ref 0.0–149.0)
VLDL: 14.6 mg/dL (ref 0.0–40.0)

## 2016-06-15 LAB — CBC WITH DIFFERENTIAL/PLATELET
BASOS ABS: 0 10*3/uL (ref 0.0–0.1)
Basophils Relative: 0.6 % (ref 0.0–3.0)
EOS ABS: 0.2 10*3/uL (ref 0.0–0.7)
Eosinophils Relative: 2.8 % (ref 0.0–5.0)
HCT: 41 % (ref 39.0–52.0)
Hemoglobin: 13.4 g/dL (ref 13.0–17.0)
LYMPHS ABS: 1.3 10*3/uL (ref 0.7–4.0)
Lymphocytes Relative: 21.4 % (ref 12.0–46.0)
MCHC: 32.7 g/dL (ref 30.0–36.0)
MCV: 91.6 fl (ref 78.0–100.0)
MONO ABS: 0.7 10*3/uL (ref 0.1–1.0)
Monocytes Relative: 12.2 % — ABNORMAL HIGH (ref 3.0–12.0)
NEUTROS ABS: 3.7 10*3/uL (ref 1.4–7.7)
NEUTROS PCT: 63 % (ref 43.0–77.0)
PLATELETS: 182 10*3/uL (ref 150.0–400.0)
RBC: 4.48 Mil/uL (ref 4.22–5.81)
RDW: 26.4 % — AB (ref 11.5–15.5)
WBC: 5.9 10*3/uL (ref 4.0–10.5)

## 2016-06-15 LAB — TSH: TSH: 1.11 u[IU]/mL (ref 0.35–4.50)

## 2016-06-15 MED ORDER — AZILSARTAN-CHLORTHALIDONE 40-12.5 MG PO TABS: 1.0000 | ORAL_TABLET | Freq: Every day | ORAL | Status: DC

## 2016-06-15 NOTE — Patient Instructions (Signed)
Hypertension Hypertension, commonly called high blood pressure, is when the force of blood pumping through your arteries is too strong. Your arteries are the blood vessels that carry blood from your heart throughout your body. A blood pressure reading consists of a higher number over a lower number, such as 110/72. The higher number (systolic) is the pressure inside your arteries when your heart pumps. The lower number (diastolic) is the pressure inside your arteries when your heart relaxes. Ideally you want your blood pressure below 120/80. Hypertension forces your heart to work harder to pump blood. Your arteries may become narrow or stiff. Having untreated or uncontrolled hypertension can cause heart attack, stroke, kidney disease, and other problems. RISK FACTORS Some risk factors for high blood pressure are controllable. Others are not.  Risk factors you cannot control include:   Race. You may be at higher risk if you are African American.  Age. Risk increases with age.  Gender. Men are at higher risk than women before age 45 years. After age 65, women are at higher risk than men. Risk factors you can control include:  Not getting enough exercise or physical activity.  Being overweight.  Getting too much fat, sugar, calories, or salt in your diet.  Drinking too much alcohol. SIGNS AND SYMPTOMS Hypertension does not usually cause signs or symptoms. Extremely high blood pressure (hypertensive crisis) may cause headache, anxiety, shortness of breath, and nosebleed. DIAGNOSIS To check if you have hypertension, your health care provider will measure your blood pressure while you are seated, with your arm held at the level of your heart. It should be measured at least twice using the same arm. Certain conditions can cause a difference in blood pressure between your right and left arms. A blood pressure reading that is higher than normal on one occasion does not mean that you need treatment. If  it is not clear whether you have high blood pressure, you may be asked to return on a different day to have your blood pressure checked again. Or, you may be asked to monitor your blood pressure at home for 1 or more weeks. TREATMENT Treating high blood pressure includes making lifestyle changes and possibly taking medicine. Living a healthy lifestyle can help lower high blood pressure. You may need to change some of your habits. Lifestyle changes may include:  Following the DASH diet. This diet is high in fruits, vegetables, and whole grains. It is low in salt, red meat, and added sugars.  Keep your sodium intake below 2,300 mg per day.  Getting at least 30-45 minutes of aerobic exercise at least 4 times per week.  Losing weight if necessary.  Not smoking.  Limiting alcoholic beverages.  Learning ways to reduce stress. Your health care provider may prescribe medicine if lifestyle changes are not enough to get your blood pressure under control, and if one of the following is true:  You are 18-59 years of age and your systolic blood pressure is above 140.  You are 60 years of age or older, and your systolic blood pressure is above 150.  Your diastolic blood pressure is above 90.  You have diabetes, and your systolic blood pressure is over 140 or your diastolic blood pressure is over 90.  You have kidney disease and your blood pressure is above 140/90.  You have heart disease and your blood pressure is above 140/90. Your personal target blood pressure may vary depending on your medical conditions, your age, and other factors. HOME CARE INSTRUCTIONS    Have your blood pressure rechecked as directed by your health care provider.   Take medicines only as directed by your health care provider. Follow the directions carefully. Blood pressure medicines must be taken as prescribed. The medicine does not work as well when you skip doses. Skipping doses also puts you at risk for  problems.  Do not smoke.   Monitor your blood pressure at home as directed by your health care provider. SEEK MEDICAL CARE IF:   You think you are having a reaction to medicines taken.  You have recurrent headaches or feel dizzy.  You have swelling in your ankles.  You have trouble with your vision. SEEK IMMEDIATE MEDICAL CARE IF:  You develop a severe headache or confusion.  You have unusual weakness, numbness, or feel faint.  You have severe chest or abdominal pain.  You vomit repeatedly.  You have trouble breathing. MAKE SURE YOU:   Understand these instructions.  Will watch your condition.  Will get help right away if you are not doing well or get worse.   This information is not intended to replace advice given to you by your health care provider. Make sure you discuss any questions you have with your health care provider.   Document Released: 11/15/2005 Document Revised: 04/01/2015 Document Reviewed: 09/07/2013 Elsevier Interactive Patient Education 2016 [employer]  

## 2016-06-15 NOTE — Progress Notes (Signed)
Subjective:  Patient ID: Lee Lee, male    DOB: 1951-04-09  Age: 65 y.o. MRN: [id number]  CC: Hypertension and Hyperlipidemia   HPI Lee Lee presents for a BP check. He has monitored his blood pressure at home and his had some spikes as high as 180/100. He denies any recent episodes of headache/blurred vision/chest pain/shortness of breath/palpitations/edema/fatigue.  He is trying to quit drinking alcohol. He went to rehabilitation at [facility] about 6 months ago and tells me that he was sober for about 150 days but then he went on a golf trip and relapsed. His alcoholism is complicated by depression. 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 He has since seen a psychiatrist named Dr. Pauline Good and has startted naltrexone and Trentellix and is making some improvement. He still has anhedonia and disturbed sleeping pattern. He denies suicidal or homicidal ideations.  History Lee Lee has a past medical history of Calcium oxalate renal stones; Hemorrhoids; ED (erectile dysfunction); Complication of anesthesia; Hypertension; Atrial flutter ([place]); and Arthritis.   He has past surgical history that includes Colonoscopy (11/2009); Arthroscopic repair ACL; Rotator cuff repair; Ablation of dysrhythmic focus (01/05/2013); Atrial flutter ablation (N/A, 01/05/2013); and Cardiac catheterization (N/A, 07/04/2015).   His family history includes Lymphoma in his mother; Valvular heart disease in his mother.He reports that he quit smoking about 28 years ago. He quit smokeless tobacco use about 4 years ago. He reports that he drinks alcohol. He reports that he does not use illicit drugs.  Outpatient Prescriptions Prior to Visit  Medication Sig Dispense Refill  . apixaban (ELIQUIS) 5 MG TABS tablet Take 1 tablet (5 mg total) by mouth 2 (two) times daily. 60 tablet 6  . CIALIS 20 MG  tablet TAKE 1 TABLET (20 MG TOTAL) BY MOUTH DAILY AS NEEDED FOR ERECTILE DYSFUNCTION 6 tablet 8  . metoprolol succinate (TOPROL-XL) 25 MG 24 hr tablet TAKE 1 TABLET (25 MG TOTAL) BY MOUTH 2 (TWO) TIMES DAILY. TAKE WITH OR IMMEDIATELY FOLLOWING A MEAL. 180 tablet 2  . losartan (COZAAR) 100 MG tablet Take 1 tablet (100 mg total) by mouth daily. 30 tablet 10  . ALPRAZolam (XANAX) 0.25 MG tablet Take 1 tablet (0.25 mg total) by mouth at bedtime as needed for anxiety. 30 tablet 0  . PRIMIDONE PO 25 mg. Take two tablets by mouth every morning    . zolpidem (AMBIEN) 10 MG tablet Take 0.5 tablets (5 mg total) by mouth at bedtime as needed for sleep (sleep). 30 tablet 0   No facility-administered medications prior to visit.    ROS Review of Systems  Constitutional: Negative.  Negative for chills and fatigue.  HENT: Negative.   Eyes: Negative.   Respiratory: Negative.  Negative for cough, chest tightness, shortness of breath and wheezing.   Cardiovascular: Negative.  Negative for chest pain, palpitations and leg swelling.  Gastrointestinal: Negative.  Negative for nausea, vomiting, abdominal pain, diarrhea and constipation.  Endocrine: Negative.   Genitourinary: Negative.   Musculoskeletal: Negative.  Negative for myalgias, back pain, arthralgias and neck pain.  Skin: Negative.  Negative for color change and rash.  Allergic/Immunologic: Negative.   Neurological: Negative.  Negative for dizziness, weakness, light-headedness, numbness and headaches.  Hematological: Negative.  Negative for adenopathy. Does not bruise/bleed easily.  Psychiatric/Behavioral: Positive for sleep disturbance and dysphoric mood. Negative for suicidal ideas, confusion, decreased concentration and  agitation. The patient is not nervous/anxious.     Objective:  BP 162/92 mmHg  Pulse 55  Temp(Src) 98.1 F (36.7 C) (Oral)  Resp 16  Ht 5' 11"  (1.803 m)  Wt 222 lb 12 oz (101.039 kg)  BMI 31.08 kg/m2  SpO2 98%  Physical  Exam  Constitutional: He is oriented to person, place, and time. No distress.  HENT:  Mouth/Throat: Oropharynx is clear and moist. No oropharyngeal exudate.  Eyes: Conjunctivae are normal. Right eye exhibits no discharge. Left eye exhibits no discharge. No scleral icterus.  Neck: Normal range of motion. Neck supple. No JVD present. No tracheal deviation present. No thyromegaly present.  Cardiovascular: Normal rate, regular rhythm, normal heart sounds and intact distal pulses.  Exam reveals no gallop and no friction rub.   No murmur heard. Pulmonary/Chest: Effort normal and breath sounds normal. No stridor. No respiratory distress. He has no wheezes. He has no rales. He exhibits no tenderness.  Abdominal: Soft. Bowel sounds are normal. He exhibits no distension and no mass. There is no tenderness. There is no rebound and no guarding.  Musculoskeletal: Normal range of motion. He exhibits no edema or tenderness.  Lymphadenopathy:    He has no cervical adenopathy.  Neurological: He is oriented to person, place, and time.  Skin: Skin is warm and dry. No rash noted. He is not diaphoretic. No erythema. No pallor.  Vitals reviewed.   Lab Results  Component Value Date   WBC 5.1 06/27/2015   HGB 13.7 06/27/2015   HCT 39.9 06/27/2015   PLT 153.0 06/27/2015   GLUCOSE 82 06/27/2015   CHOL 196 10/05/2012   TRIG 257* 10/05/2012   HDL 56 10/05/2012   LDLCALC 89 10/05/2012   ALT 37 09/24/2012   AST 58* 09/24/2012   NA 139 06/27/2015   K 3.8 06/27/2015   CL 102 06/27/2015   CREATININE 0.81 06/27/2015   BUN 11 06/27/2015   CO2 28 06/27/2015   TSH 0.668 10/05/2012   INR 1.05 09/24/2012    Assessment & Plan:   Lee Lee was seen today for hypertension and hyperlipidemia.  Diagnoses and all orders for this visit:  Ascending aortic aneurysm ([place])- he is asymptomatic with respect to this, he needs better blood pressure control, will check his LDL to see if he would benefit from statin  therapy. -     Lipid panel; Future  Essential hypertension- his blood pressure is not adequately well controlled, I will change him to a more potent ARB and will add a low-dose diuretic, will continue metoprolol at the current dose. -     Comprehensive metabolic panel; Future -     TSH; Future -     CBC with Differential/Platelet; Future -     Azilsartan-Chlorthalidone (EDARBYCLOR) 40-12.5 MG TABS; Take 1 tablet by mouth daily.  Hypertriglyceridemia- will recheck his triglycerides and will treat if they are approaching 500 -     Lipid panel; Future  Hyperlipidemia with target LDL less than 130- will consider statin therapy. -     Lipid panel; Future -     TSH; Future  Alcohol abuse- in the next week or 2 he is going to be admitted to [facility] rehabilitation center for a another treatment   I have discontinued Lee Lee PRIMIDONE PO, zolpidem, ALPRAZolam, losartan, and traZODone. I am also having him start on Azilsartan-Chlorthalidone. Additionally, I am having him maintain his apixaban, CIALIS, metoprolol succinate, vortioxetine HBr, and naltrexone.  Meds ordered this encounter  Medications  .  DISCONTD: traZODone (DESYREL) 50 MG tablet    Sig: Take 50 mg by mouth at bedtime as needed for sleep.  Marland Kitchen vortioxetine HBr (TRINTELLIX) 10 MG TABS    Sig: Take 10 mg by mouth daily.  . Azilsartan-Chlorthalidone (EDARBYCLOR) 40-12.5 MG TABS    Sig: Take 1 tablet by mouth daily.    Dispense:  30 tablet    Refill:  5  . naltrexone (DEPADE) 50 MG tablet    Sig: Take 1 tablet (50 mg total) by mouth daily.    Dispense:  30 tablet    Refill:  5     Follow-up: Return in about 3 months (around 09/15/2016).  Scarlette Calico, MD

## 2016-06-15 NOTE — Progress Notes (Signed)
Pre visit review using our clinic review tool, if applicable. No additional management support is needed unless otherwise documented below in the visit note. 

## 2016-06-18 ENCOUNTER — Other Ambulatory Visit: Payer: Self-pay | Admitting: Emergency Medicine

## 2016-06-18 NOTE — Telephone Encounter (Signed)
Pt called and stated his sample of Azilsartan-Chlorthalidone (EDARBYCLOR) 40-12.5 MG TABS  worked and he would like a prescription if that is possible. He called [facility] but they dont have a prescription for it. Please follow up thanks.

## 2016-06-18 NOTE — Telephone Encounter (Signed)
Calling Lee Lee at [facility] Pharmacy stated that they needed to order it and it did arrive today.   Pt informed of same via vm.

## 2016-07-01 ENCOUNTER — Other Ambulatory Visit: Payer: Self-pay | Admitting: Cardiology

## 2016-07-27 ENCOUNTER — Other Ambulatory Visit: Payer: Self-pay | Admitting: Cardiology

## 2016-08-31 ENCOUNTER — Other Ambulatory Visit: Payer: Self-pay | Admitting: Cardiology

## 2016-09-01 ENCOUNTER — Other Ambulatory Visit: Payer: Self-pay

## 2016-09-01 MED ORDER — APIXABAN 5 MG PO TABS
5.0000 mg | ORAL_TABLET | Freq: Two times a day (BID) | ORAL | 0 refills | Status: DC
Start: 2016-09-01 — End: 2016-10-04

## 2016-09-30 ENCOUNTER — Encounter: Payer: Self-pay | Admitting: Internal Medicine

## 2016-09-30 ENCOUNTER — Other Ambulatory Visit (INDEPENDENT_AMBULATORY_CARE_PROVIDER_SITE_OTHER): Payer: BLUE CROSS/BLUE SHIELD

## 2016-09-30 ENCOUNTER — Ambulatory Visit (INDEPENDENT_AMBULATORY_CARE_PROVIDER_SITE_OTHER): Payer: BLUE CROSS/BLUE SHIELD | Admitting: Internal Medicine

## 2016-09-30 VITALS — BP 120/70 | HR 60 | Temp 98.2°F | Resp 16 | Ht 70.0 in | Wt 242.0 lb

## 2016-09-30 DIAGNOSIS — R739 Hyperglycemia, unspecified: Secondary | ICD-10-CM

## 2016-09-30 DIAGNOSIS — I10 Essential (primary) hypertension: Secondary | ICD-10-CM

## 2016-09-30 DIAGNOSIS — G2581 Restless legs syndrome: Secondary | ICD-10-CM

## 2016-09-30 DIAGNOSIS — F409 Phobic anxiety disorder, unspecified: Secondary | ICD-10-CM

## 2016-09-30 DIAGNOSIS — D509 Iron deficiency anemia, unspecified: Secondary | ICD-10-CM | POA: Insufficient documentation

## 2016-09-30 DIAGNOSIS — F5105 Insomnia due to other mental disorder: Secondary | ICD-10-CM | POA: Insufficient documentation

## 2016-09-30 DIAGNOSIS — D508 Other iron deficiency anemias: Secondary | ICD-10-CM

## 2016-09-30 LAB — CBC WITH DIFFERENTIAL/PLATELET
BASOS ABS: 0.2 10*3/uL — AB (ref 0.0–0.1)
BASOS PCT: 3.2 % — AB (ref 0.0–3.0)
Eosinophils Absolute: 0.1 10*3/uL (ref 0.0–0.7)
Eosinophils Relative: 2.7 % (ref 0.0–5.0)
HEMATOCRIT: 28.8 % — AB (ref 39.0–52.0)
HEMOGLOBIN: 9.4 g/dL — AB (ref 13.0–17.0)
LYMPHS PCT: 24.7 % (ref 12.0–46.0)
Lymphs Abs: 1.3 10*3/uL (ref 0.7–4.0)
MCHC: 32.5 g/dL (ref 30.0–36.0)
MCV: 84.4 fl (ref 78.0–100.0)
MONOS PCT: 8.6 % (ref 3.0–12.0)
Monocytes Absolute: 0.5 10*3/uL (ref 0.1–1.0)
NEUTROS ABS: 3.2 10*3/uL (ref 1.4–7.7)
Neutrophils Relative %: 60.8 % (ref 43.0–77.0)
Platelets: 231 10*3/uL (ref 150.0–400.0)
RBC: 3.41 Mil/uL — ABNORMAL LOW (ref 4.22–5.81)
RDW: 15.9 % — ABNORMAL HIGH (ref 11.5–15.5)
WBC: 5.3 10*3/uL (ref 4.0–10.5)

## 2016-09-30 LAB — BASIC METABOLIC PANEL
BUN: 19 mg/dL (ref 6–23)
CALCIUM: 9.4 mg/dL (ref 8.4–10.5)
CHLORIDE: 104 meq/L (ref 96–112)
CO2: 29 meq/L (ref 19–32)
Creatinine, Ser: 0.87 mg/dL (ref 0.40–1.50)
GFR: 93.96 mL/min (ref >60.00)
Glucose, Bld: 115 mg/dL — ABNORMAL HIGH (ref 70–99)
Potassium: 3.8 mEq/L (ref 3.5–5.1)
SODIUM: 141 meq/L (ref 135–145)

## 2016-09-30 LAB — IBC PANEL
Iron: 40 ug/dL — ABNORMAL LOW (ref 42–165)
SATURATION RATIOS: 9.7 % — AB (ref 20.0–50.0)
Transferrin: 295 mg/dL (ref 212.0–360.0)

## 2016-09-30 LAB — HEMOGLOBIN A1C: Hgb A1c MFr Bld: 5.6 % (ref 4.6–6.5)

## 2016-09-30 LAB — FERRITIN: Ferritin: 6 ng/mL — ABNORMAL LOW (ref 22.0–322.0)

## 2016-09-30 MED ORDER — FERRALET 90 90-1 MG PO TABS: 1.0000 | ORAL_TABLET | Freq: Every day | ORAL | 3 refills | Status: DC

## 2016-09-30 MED ORDER — TRAZODONE HCL 150 MG PO TABS: 150.0000 mg | ORAL_TABLET | Freq: Every evening | ORAL | 3 refills | Status: DC | PRN

## 2016-09-30 NOTE — Progress Notes (Signed)
Pre visit review using our clinic review tool, if applicable. No additional management support is needed unless otherwise documented below in the visit note. 

## 2016-09-30 NOTE — Progress Notes (Signed)
Subjective:  Patient ID: Lee Lee, male    DOB: March 13, 1951  Age: 65 y.o. MRN: [id number]  CC: Hypertension and Anemia   HPI Lee Lee presents for follow-up. He tells me that his blood pressure has been well controlled. Since I last saw him he has done a 3 month inpatient treatment in [place] for alcohol dependence. He has been absent from alcohol for about 3 months now and for the most part is doing well. He has developed insomnia that he describes as difficulty falling asleep. He is still on Trintellix for anxiety and depression but while in rehabilitation Remeron was added. He said Remeron has not helped him. He complains of a 20 pound weight gain.  He also complains that his right lower extremity awakens him during the night. He feels a lightening bolt sensation that radiates from his right knee down into his foot. The sensation will not go away until he gets up and walks around or moves his right leg or foot. He tells me his mother and his sister both have restless leg syndrome.  Outpatient Medications Prior to Visit  Medication Sig Dispense Refill  . apixaban (ELIQUIS) 5 MG TABS tablet Take 1 tablet (5 mg total) by mouth 2 (two) times daily. 60 tablet 0  . Azilsartan-Chlorthalidone (EDARBYCLOR) 40-12.5 MG TABS Take 1 tablet by mouth daily. 30 tablet 5  . CIALIS 20 MG tablet TAKE 1 TABLET (20 MG TOTAL) BY MOUTH DAILY AS NEEDED FOR ERECTILE DYSFUNCTION 6 tablet 8  . metoprolol succinate (TOPROL-XL) 25 MG 24 hr tablet TAKE 1 TABLET (25 MG TOTAL) BY MOUTH 2 (TWO) TIMES DAILY. TAKE WITH OR IMMEDIATELY FOLLOWING A MEAL. 180 tablet 2  . vortioxetine HBr (TRINTELLIX) 10 MG TABS Take 10 mg by mouth daily.    . naltrexone (DEPADE) 50 MG tablet Take 1 tablet (50 mg total) by mouth daily. 30 tablet 5   No facility-administered medications prior to visit.     ROS Review of Systems  Constitutional: Positive for unexpected weight change. Negative for activity change,  appetite change, diaphoresis and fatigue.  HENT: Negative.   Eyes: Negative.  Negative for visual disturbance.  Respiratory: Negative.  Negative for cough, choking, chest tightness, shortness of breath and stridor.   Cardiovascular: Negative.  Negative for chest pain, palpitations and leg swelling.  Gastrointestinal: Negative.  Negative for abdominal pain, anal bleeding, blood in stool, diarrhea, nausea and vomiting.  Genitourinary: Negative for difficulty urinating, dysuria and hematuria.  Musculoskeletal: Negative.  Negative for arthralgias, back pain, myalgias and neck pain.  Skin: Negative.  Negative for color change and rash.  Allergic/Immunologic: Negative.   Neurological: Negative.  Negative for dizziness, tremors, numbness and headaches.  Hematological: Negative.  Negative for adenopathy. Does not bruise/bleed easily.  Psychiatric/Behavioral: Negative.     Objective:  BP 120/70 (BP Location: Left Arm, Patient Position: Sitting, Cuff Size: Normal)   Pulse 60   Temp 98.2 F (36.8 C) (Oral)   Resp 16   Ht 5' 10"  (1.778 m)   Wt 242 lb (109.8 kg)   SpO2 98%   BMI 34.72 kg/m   BP Readings from Last 3 Encounters:  09/30/16 120/70  06/15/16 (!) 162/92  07/30/15 (!) 148/98    Wt Readings from Last 3 Encounters:  09/30/16 242 lb (109.8 kg)  06/15/16 222 lb (100.7 kg)  07/30/15 233 lb (105.7 kg)    Physical Exam  Constitutional: He is oriented to person, place, and time. No distress.  HENT:  Mouth/Throat: Oropharynx is clear and moist. No oropharyngeal exudate.  Eyes: Conjunctivae are normal. Right eye exhibits no discharge. Left eye exhibits no discharge. No scleral icterus.  Neck: Normal range of motion. Neck supple. No JVD present. No tracheal deviation present. No thyromegaly present.  Cardiovascular: Normal rate, regular rhythm, normal heart sounds and intact distal pulses.  Exam reveals no gallop and no friction rub.   No murmur heard. Pulses:      Carotid pulses  are 1+ on the right side, and 1+ on the left side.      Radial pulses are 1+ on the right side, and 1+ on the left side.       Femoral pulses are 1+ on the right side, and 1+ on the left side.      Popliteal pulses are 1+ on the right side, and 1+ on the left side.       Dorsalis pedis pulses are 1+ on the right side, and 1+ on the left side.       Posterior tibial pulses are 1+ on the right side, and 1+ on the left side.  Pulmonary/Chest: Effort normal and breath sounds normal. No stridor. No respiratory distress. He has no wheezes. He has no rales. He exhibits no tenderness.  Abdominal: Soft. Bowel sounds are normal. He exhibits no distension and no mass. There is no tenderness. There is no rebound and no guarding.  Musculoskeletal: Normal range of motion. He exhibits no edema, tenderness or deformity.  Lymphadenopathy:    He has no cervical adenopathy.  Neurological: He is alert and oriented to person, place, and time. He has normal strength. He displays no atrophy, no tremor and normal reflexes. No cranial nerve deficit or sensory deficit. He exhibits normal muscle tone. He displays a negative Romberg sign. He displays no seizure activity. Coordination and gait normal. He displays no Babinski's sign on the right side. He displays no Babinski's sign on the left side.  Reflex Scores:      Tricep reflexes are 1+ on the right side and 1+ on the left side.      Bicep reflexes are 1+ on the right side and 1+ on the left side.      Brachioradialis reflexes are 1+ on the right side and 1+ on the left side.      Patellar reflexes are 1+ on the right side and 1+ on the left side.      Achilles reflexes are 1+ on the right side and 1+ on the left side. Skin: Skin is warm and dry. No rash noted. He is not diaphoretic. No erythema. No pallor.  Vitals reviewed.   Lab Results  Component Value Date   WBC 5.3 09/30/2016   HGB 9.4 (L) 09/30/2016   HCT 28.8 (L) 09/30/2016   PLT 231.0 09/30/2016    GLUCOSE 115 (H) 09/30/2016   CHOL 183 06/15/2016   TRIG 73.0 06/15/2016   HDL 88.30 06/15/2016   LDLCALC 80 06/15/2016   ALT 16 06/15/2016   AST 23 06/15/2016   NA 141 09/30/2016   K 3.8 09/30/2016   CL 104 09/30/2016   CREATININE 0.87 09/30/2016   BUN 19 09/30/2016   CO2 29 09/30/2016   TSH 1.11 06/15/2016   INR 1.05 09/24/2012   HGBA1C 5.6 09/30/2016    Ct Abdomen Pelvis W Wo Contrast  Result Date: 07/23/2015 CLINICAL DATA:  Initial encounter for micro hematuria EXAM: CT ABDOMEN AND PELVIS WITHOUT AND WITH CONTRAST TECHNIQUE: Multidetector CT imaging  of the abdomen and pelvis was performed following the standard protocol before and following the bolus administration of intravenous contrast. CONTRAST:  174m ISOVUE-300 IOPAMIDOL (ISOVUE-300) INJECTION 61% COMPARISON:  01/03/2007 FINDINGS: Lower chest: Tiny nodules along the right major fissure unchanged since 2008, consistent with benign process. Similar tiny nodules in the left lung base are also stable. Hepatobiliary: The liver shows diffusely decreased attenuation suggesting steatosis. No focal abnormality within the liver parenchyma. There is no evidence for gallstones, gallbladder wall thickening, or pericholecystic fluid. No intrahepatic or extrahepatic biliary dilation. Pancreas: No focal mass lesion. No dilatation of the main duct. No intraparenchymal cyst. No peripancreatic edema. Spleen: No splenomegaly. No focal mass lesion. Adrenals/Urinary Tract: No adrenal nodule or mass. Pre contrast imaging shows no mineralized stone in either kidney or ureter. No bladder stone. Imaging after IV contrast administration no evidence for enhancing lesion in either kidney. Exophytic 2.5 cm lesion from the lower pole of the left kidney measures 2.5 cm and shows no convincing evidence for enhancement after IV contrast administration. This lesion was present 8 years ago and measured 2.0 cm at that time. Imaging features are most suggestive of a benign  etiology such as cyst. Delayed postcontrast imaging shows no abnormality of either intrarenal collecting system or renal pelvis. Both ureters are well opacified without evidence of focal hydroureter, intraluminal filling defect, or wall thickening. No focal bladder wall abnormality is seen on delayed imaging. Stomach/Bowel: Stomach is nondistended. No gastric wall thickening. No evidence of outlet obstruction. Duodenum is normally positioned as is the ligament of Treitz. No small bowel wall thickening. No small bowel dilatation. The terminal ileum is normal. The appendix is normal. No gross colonic mass. No colonic wall thickening. No substantial diverticular change. Vascular/Lymphatic: There is abdominal aortic atherosclerosis without aneurysm. Portal vein and superior mesenteric vein are patent. There is no gastrohepatic or hepatoduodenal ligament lymphadenopathy. No intraperitoneal or retroperitoneal lymphadenopy. No mesenteric or pelvic sidewall lymphadenopathy. Reproductive: Dystrophic calcification noted in the central prostate gland. Seminal vesicles unremarkable. Other: Tiny right inguinal hernia contains only fat. No intraperitoneal free fluid. Musculoskeletal: Bone windows reveal no worrisome lytic or sclerotic osseous lesions. IMPRESSION: No CT findings to explain the patient's history of micro hematuria. Slight increase in size of left renal cyst over the 8 year interval since prior exam. Tiny right inguinal hernia contains only fat. Electronically Signed   By: EMisty StanleyM.D.   On: 07/23/2015 17:31    Assessment & Plan:   SBoonewas seen today for hypertension and anemia.  Diagnoses and all orders for this visit:  Essential hypertension- His blood pressure is well-controlled, electrolytes and renal function are stable. -     Basic metabolic panel; Future  Hyperglycemia- his A1c is at 5.6%, he has very mild prediabetes, he agrees to work on his lifestyle modifications, no medications are  needed at this time. -     Basic metabolic panel; Future -     Hemoglobin A1c; Future  Insomnia due to anxiety and fear- I've asked him to stop taking Remeron since it is not effective and is probably contributed to his 20 pound weight gain, will try to control the insomnia with trazodone starting in the 150 mg dose -     traZODone (DESYREL) 150 MG tablet; Take 1 tablet (150 mg total) by mouth at bedtime as needed for sleep.  Restless leg syndrome, familial- he is already taking Neurontin, will start trazodone see if it helps with this, he also has iron deficiency  anemia with a low ferritin level, will start replacing his iron level with a goal of a ferritin level above 50 -     traZODone (DESYREL) 150 MG tablet; Take 1 tablet (150 mg total) by mouth at bedtime as needed for sleep. -     CBC with Differential/Platelet; Future -     Ferritin; Future -     IBC panel; Future -     Fe Cbn-Fe Gluc-FA-B12-C-DSS (FERRALET 90) 90-1 MG TABS; Take 1 tablet by mouth daily.  Other iron deficiency anemia- he tells me he had a colonoscopy earlier this year with Dr. Cristina Gong but I have no records of that, I'm concerned he may have a GI source for blood loss as he reports no other sources of blood loss, there is no concern for malabsorption, of course he could have dietary deficiency. Nonetheless, I will refer him to GI to see if his intestinal tract needs to be evaluated for possible sources of blood loss and will start iron replacement therapy. -     Fe Cbn-Fe Gluc-FA-B12-C-DSS (FERRALET 90) 90-1 MG TABS; Take 1 tablet by mouth daily. -     Ambulatory referral to Gastroenterology   I have discontinued Mr. Ruhlman naltrexone and mirtazapine. I am also having him start on traZODone and FERRALET 90. Additionally, I am having him maintain his CIALIS, metoprolol succinate, vortioxetine HBr, Azilsartan-Chlorthalidone, apixaban, L-Methylfolate-Algae (DEPLIN 15 PO), and gabapentin.  Meds ordered this encounter    Medications  . L-Methylfolate-Algae (DEPLIN 15 PO)    Sig: Take by mouth.  . gabapentin (NEURONTIN) 300 MG capsule    Sig: Take 300 mg by mouth 3 (three) times daily.  Marland Kitchen DISCONTD: mirtazapine (REMERON) 15 MG tablet    Sig: Take 15 mg by mouth at bedtime.  . traZODone (DESYREL) 150 MG tablet    Sig: Take 1 tablet (150 mg total) by mouth at bedtime as needed for sleep.    Dispense:  90 tablet    Refill:  3  . Fe Cbn-Fe Gluc-FA-B12-C-DSS (FERRALET 90) 90-1 MG TABS    Sig: Take 1 tablet by mouth daily.    Dispense:  90 each    Refill:  3     Follow-up: Return in about 4 weeks (around 10/28/2016).  Scarlette Calico, MD

## 2016-09-30 NOTE — Patient Instructions (Signed)
Restless Legs Syndrome Restless legs syndrome is a condition that causes uncomfortable feelings or sensations in the legs, especially while sitting or lying down. The sensations usually cause an overwhelming urge to move the legs. The arms can also sometimes be affected. The condition can range from mild to severe. The symptoms often interfere with a person's ability to sleep. CAUSES The cause of this condition is not known. RISK FACTORS This condition is more likely to develop in:  People who are older than age 36.  Pregnant women. In general, restless legs syndrome is more common in women than in men.  People who have a family history of the condition.  People who have certain medical conditions, such as iron deficiency, kidney disease, Parkinson disease, or nerve damage.  People who take certain medicines, such as medicines for high blood pressure, nausea, colds, allergies, depression, and some heart conditions. SYMPTOMS The main symptom of this condition is uncomfortable sensations in the legs. These sensations may be:  Described as pulling, tingling, prickling, throbbing, crawling, or burning.  Worse while you are sitting or lying down.  Worse during periods of rest or inactivity.  Worse at night, often interfering with your sleep.  Accompanied by a very strong urge to move your legs.  Temporarily relieved by movement of your legs. The sensations usually affect both sides of the body. The arms can also be affected, but this is rare. People who have this condition often have tiredness during the day because of their lack of sleep at night. DIAGNOSIS This condition may be diagnosed based on your description of the symptoms. You may also have tests, including blood tests, to check for other conditions that may lead to your symptoms. In some cases, you may be asked to spend some time in a sleep lab so your sleeping can be monitored. TREATMENT Treatment for this condition is  focused on managing the symptoms. Treatment may include:  Self-help and lifestyle changes.  Medicines. HOME CARE INSTRUCTIONS  Take medicines only as directed by your health care provider.  Try these methods to get temporary relief from the uncomfortable sensations:  Massage your legs.  Walk or stretch.  Take a cold or hot bath.  Practice good sleep habits. For example, go to bed and get up at the same time every day.  Exercise regularly.  Practice ways of relaxing, such as yoga or meditation.  Avoid caffeine and alcohol.  Do not use any tobacco products, including cigarettes, chewing tobacco, or electronic cigarettes. If you need help quitting, ask your health care provider.  Keep all follow-up visits as directed by your health care provider. This is important. SEEK MEDICAL CARE IF: Your symptoms do not improve with treatment, or they get worse.   This information is not intended to replace advice given to you by your health care provider. Make sure you discuss any questions you have with your health care provider.   Document Released: 11/05/2002 Document Revised: 04/01/2015 Document Reviewed: 11/11/2014 Elsevier Interactive Patient Education [employer].

## 2016-10-02 ENCOUNTER — Encounter: Payer: Self-pay | Admitting: Internal Medicine

## 2016-10-04 ENCOUNTER — Other Ambulatory Visit: Payer: Self-pay | Admitting: Cardiology

## 2016-10-06 ENCOUNTER — Telehealth: Payer: Self-pay | Admitting: Internal Medicine

## 2016-10-06 NOTE — Telephone Encounter (Signed)
Pt request to speak to the assistant concern about lab result. Please give him a call

## 2016-10-06 NOTE — Telephone Encounter (Signed)
Pt is scheduled with Dr. Watt Climes at [facility] and pt is aware of appt

## 2016-10-06 NOTE — Telephone Encounter (Signed)
Contacted pt and informed of results and the referral to GI. Forwarded [employer] to referrals to dig in deeper. Closing this note.

## 2016-10-06 NOTE — Telephone Encounter (Signed)
Lee Lee,  Can you look into this referral and see where they are at regarding scheduling the appointment.

## 2016-10-06 NOTE — Telephone Encounter (Signed)
appt has been made and [facility] left msg on pt's vm to call back.  Dr. Cristina Gong no longer takes new pts. Pt scheduled with Dr. Watt Climes on 12/1 @ 2:15

## 2016-10-06 NOTE — Telephone Encounter (Signed)
lmom for pt confirming that all questions were answered and confirmed that we were aware pt has an appt with GI on 12/1. Requested pt to contact us if he has any questions.

## 2016-11-17 ENCOUNTER — Encounter: Payer: Self-pay | Admitting: Cardiology

## 2016-11-17 ENCOUNTER — Ambulatory Visit (INDEPENDENT_AMBULATORY_CARE_PROVIDER_SITE_OTHER): Payer: BLUE CROSS/BLUE SHIELD | Admitting: Cardiology

## 2016-11-17 VITALS — BP 105/60 | HR 65

## 2016-11-17 DIAGNOSIS — I10 Essential (primary) hypertension: Secondary | ICD-10-CM | POA: Diagnosis not present

## 2016-11-17 DIAGNOSIS — I712 Thoracic aortic aneurysm, without rupture: Secondary | ICD-10-CM | POA: Diagnosis not present

## 2016-11-17 DIAGNOSIS — I7121 Aneurysm of the ascending aorta, without rupture: Secondary | ICD-10-CM

## 2016-11-17 DIAGNOSIS — I484 Atypical atrial flutter: Secondary | ICD-10-CM

## 2016-11-17 MED ORDER — METOPROLOL SUCCINATE ER 25 MG PO TB24: ORAL_TABLET | ORAL | 1 refills | Status: DC

## 2016-11-17 MED ORDER — DIAZEPAM 2 MG PO TABS: ORAL_TABLET | ORAL | 0 refills | Status: DC

## 2016-11-17 NOTE — Patient Instructions (Signed)
Medication Instructions:  Your physician recommends that you continue on your current medications as directed. Please refer to the Current Medication list given to you today.  Dr Aundra Dubin will talk with Dr [name] and let you know if you can stop taking Eliquis. Continue taking it until you hear from Dr Su Grand: none  Testing/Procedures: Schedule an appointment for an MRA of your chest.  Follow-Up: Your physician wants you to follow-up in: 6 months with Dr [name] (June 2018). You will receive a reminder letter in the mail two months in advance. If you don't receive a letter, please call our office to schedule the follow-up appointment.      If you need a refill on your cardiac medications before your next appointment, please call your pharmacy.

## 2016-11-19 NOTE — Progress Notes (Signed)
Patient ID: Lee Lee, male   DOB: 1951-11-25, 65 y.o.   MRN: [id number] PCP: Dr. Ronnald Ramp  65 yo with history of atrial flutter, atrial tachycardia, and HTN returns for cardiology evaluation.  In 10/13, he had tachypalpitations while playing golf with associated chest pressure.  He went to the ER.  While there, he had a coronary CT angiogram with no significant CAD.  Echo showed normal EF but the ascending aorta was dilated to 4.2 cm.  Lee Lee an event monitor for 3 wks.  This showed paroxysmal atrial flutter with HR to as high as the 150s as well as occasional events that may have been atrial fibrillation.  He saw Dr. [name] and had successful atrial flutter ablation in 2/14.  He had a sleep study, showing only mild OSA.  He was then found to have parahisian atrial tachycardia.  This was ablated in 8/16.  No further tachypalpitations.  No chest pain or exertional dyspnea. He golfs frequently.   Main issue recently has been anemia.  He has been seen by GI with plans for EGD soon (had unremarkable colonoscopy in 5/17).   Labs (11/13): TSH normal, LDL 89, HDL 56 Labs (12/13): K 4, creatinine 0.8 Labs (2/14): K 4.2, creatinine 0.9 Labs (7/17): LDL 88 Labs (11/17): K 3.8, creatinine 0.87, hgb 9.4  ECG: NSR, iRBBB  PMH: 1. HTN 2. Erectile dysfunction 3. Palpitations: Echo (10/13) with EF 55-60%, mild LVH, trileaflet aortic valve with 4.1 cm aortic root and 4.2 cm ascending aorta.  4. Ascending aortic aneurysm: 4.2 cm on echo and CT in 10/13.  Aortic valve is trileaflet.  Suspect this is due to HTN.  CTA chest (10/14) with 4.1 cm ascending aorta.  5. Atypical chest pain: Coronary CT angiogram in 10/13 with coronary calcium score 0 and no significant CAD noted in the coronaries.  6. Paroxysmal atrial flutter: First noted on 3 week event monitor in 12/13. Atrial flutter ablation in 2/14. Event monitor (6/14) with 1 episode atrial flutter.  7. Sleep study with mild OSA.  8. 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  9. Anemia.   SH: Nonsmoker, married, works in Multimedia programmer.  Moderate ETOH intake.   FH: Mother had valvular heart disease, sounds like AI.   ROS: All systems reviewed and negative except as per HPI.   Current Outpatient Prescriptions  Medication Sig Dispense Refill  . apixaban (ELIQUIS) 5 MG TABS tablet Take 1 tablet (5 mg total) by mouth 2 (two) times daily.    . Azilsartan-Chlorthalidone (EDARBYCLOR) 40-12.5 MG TABS Take 1 tablet by mouth daily.    Marland Kitchen CIALIS 20 MG tablet TAKE 1 TABLET (20 MG TOTAL) BY MOUTH DAILY AS NEEDED FOR ERECTILE DYSFUNCTION 6 tablet 8  . Fe Cbn-Fe Gluc-FA-B12-C-DSS (FERRALET 90) 90-1 MG TABS Take 1 tablet by mouth daily. 90 each 3  . gabapentin (NEURONTIN) 300 MG capsule Take 300 mg by mouth 3 (three) times daily.    Marland Kitchen L-Methylfolate-Algae (DEPLIN 15 PO) Take by mouth.    . metoprolol succinate (TOPROL-XL) 25 MG 24 hr tablet TAKE 1 TABLET (25 MG TOTAL) BY MOUTH 2 (TWO) TIMES DAILY. TAKE WITH OR IMMEDIATELY FOLLOWING A MEAL. 180 tablet 1  . traZODone (DESYREL) 150 MG tablet Take 1 tablet (150 mg total) by mouth at bedtime as needed for sleep. 90 tablet 3  . vortioxetine HBr (TRINTELLIX) 10 MG TABS Take 10 mg by mouth daily.    . diazepam (VALIUM) 2 MG tablet Take 1 tablet by mouth 30  minutes prior to MRA 1 tablet 0   No current facility-administered medications for this visit.     BP 105/60   Pulse 65  General: NAD Neck: No JVD, no thyromegaly or thyroid nodule.  Lungs: Clear to auscultation bilaterally with normal respiratory effort. CV: Nondisplaced PMI.  Heart regular S1/S2, +S4, no murmur.  No peripheral edema.  No carotid bruit.  Normal pedal pulses.  Abdomen: Soft, nontender, no hepatosplenomegaly, no distention.  Neurologic: Alert and oriented x 3.  Psych: Normal affect. Extremities: No clubbing or cyanosis.   Assessment/Plan: 1. Paroxysmal atrial flutter: Status post atrial flutter ablation.   - To decrease risk of  recurrent atrial arrhythmias, would keep ETOH to no more than 1-2 drinks/day. - Continue Toprol XL.  - Continue BP control. - He remains on apixaban after successful atrial flutter ablation and then atrial tachycardia ablation.  Atrial fibrillation never found.  Think it would be reasonable to stop apixaban and start on ASA 81, but will check with Dr. [name].  2. Atrial tachycardia: Para-hisian.  S/p ablation in 8/16.  No symptomatic recurrence.  3. HTN:  BP controlled. 4. Ascending aortic aneurysm: Stable 4.1 cm on CTA in 10/14.  Trileaflet aortic valve.  I suspect this is due to hypertension (no evidence for collagen vascular disease).  I will arrange for repeat MRA chest.   5. Anemia: Workup ongoing with GI.   Given my transition to CHF clinic, he will followup with Dr. [name] in 6 months.   [name] 11/19/2016

## 2016-11-25 ENCOUNTER — Telehealth: Payer: Self-pay | Admitting: Cardiology

## 2016-11-25 MED ORDER — ASPIRIN EC 81 MG PO TBEC: 81.0000 mg | DELAYED_RELEASE_TABLET | Freq: Every day | ORAL | 3 refills | Status: DC

## 2016-11-25 NOTE — Telephone Encounter (Signed)
Larey Dresser, MD  Thompson Grayer, RN; Katrine Coho, RN; P [facility] Triage        Checked with Dr. [name], ok for this patient to stop apixaban and start ASA 81 daily. Please call him.    Spoke with pt and informed him of new medication instructions.  Pt verbalized understanding and was in agreement with this plan.

## 2016-12-01 ENCOUNTER — Other Ambulatory Visit: Payer: BLUE CROSS/BLUE SHIELD | Admitting: *Deleted

## 2016-12-01 ENCOUNTER — Ambulatory Visit (HOSPITAL_COMMUNITY): Payer: BLUE CROSS/BLUE SHIELD

## 2016-12-01 DIAGNOSIS — I483 Typical atrial flutter: Secondary | ICD-10-CM

## 2016-12-01 DIAGNOSIS — I10 Essential (primary) hypertension: Secondary | ICD-10-CM

## 2016-12-01 NOTE — Addendum Note (Signed)
Addended by: Eulis Foster on: 12/01/2016 03:03 PM   Modules accepted: Orders

## 2016-12-02 LAB — BASIC METABOLIC PANEL
BUN / CREAT RATIO: 21 (ref 10–24)
BUN: 17 mg/dL (ref 8–27)
CALCIUM: 9 mg/dL (ref 8.6–10.2)
CHLORIDE: 99 mmol/L (ref 96–106)
CO2: 25 mmol/L (ref 18–29)
Creatinine, Ser: 0.81 mg/dL (ref 0.76–1.27)
GFR calc Af Amer: 109 mL/min/{1.73_m2} (ref >59)
GFR calc non Af Amer: 95 mL/min/{1.73_m2} (ref >59)
GLUCOSE: 78 mg/dL (ref 65–99)
POTASSIUM: 4.3 mmol/L (ref 3.5–5.2)
Sodium: 142 mmol/L (ref 134–144)

## 2016-12-03 ENCOUNTER — Telehealth: Payer: Self-pay | Admitting: Internal Medicine

## 2016-12-03 NOTE — Telephone Encounter (Signed)
Lee Lee from [employer] is calling because she needs a pre-authorization. If you have any questions you can reach Lee Lee at [phone]. Thank.

## 2016-12-06 ENCOUNTER — Ambulatory Visit
Admission: RE | Admit: 2016-12-06 | Discharge: 2016-12-06 | Disposition: A | Discharge status: Home / Self Care | Payer: BLUE CROSS/BLUE SHIELD | Source: Ambulatory Visit | Attending: Cardiology | Admitting: Cardiology

## 2016-12-06 DIAGNOSIS — I10 Essential (primary) hypertension: Secondary | ICD-10-CM

## 2016-12-06 DIAGNOSIS — I7121 Aneurysm of the ascending aorta, without rupture: Secondary | ICD-10-CM

## 2016-12-06 DIAGNOSIS — I712 Thoracic aortic aneurysm, without rupture: Secondary | ICD-10-CM

## 2016-12-06 MED ORDER — GADOBENATE DIMEGLUMINE 529 MG/ML IV SOLN
20.0000 mL | Freq: Once | INTRAVENOUS | Status: AC | PRN
  Administered 2016-12-06: 20 mL via INTRAVENOUS

## 2016-12-26 ENCOUNTER — Other Ambulatory Visit: Payer: Self-pay | Admitting: Internal Medicine

## 2016-12-26 DIAGNOSIS — I712 Thoracic aortic aneurysm, without rupture: Secondary | ICD-10-CM

## 2016-12-26 DIAGNOSIS — I10 Essential (primary) hypertension: Secondary | ICD-10-CM

## 2016-12-26 DIAGNOSIS — I7121 Aneurysm of the ascending aorta, without rupture: Secondary | ICD-10-CM

## 2016-12-27 MED ORDER — AZILSARTAN-CHLORTHALIDONE 40-12.5 MG PO TABS: 1.0000 | ORAL_TABLET | Freq: Every day | ORAL | 5 refills | Status: DC

## 2016-12-29 ENCOUNTER — Ambulatory Visit (INDEPENDENT_AMBULATORY_CARE_PROVIDER_SITE_OTHER): Payer: BLUE CROSS/BLUE SHIELD | Admitting: Internal Medicine

## 2016-12-29 ENCOUNTER — Other Ambulatory Visit (INDEPENDENT_AMBULATORY_CARE_PROVIDER_SITE_OTHER): Payer: BLUE CROSS/BLUE SHIELD

## 2016-12-29 ENCOUNTER — Encounter: Payer: Self-pay | Admitting: Internal Medicine

## 2016-12-29 VITALS — BP 134/80 | HR 54 | Temp 98.0°F | Resp 16 | Ht 70.0 in | Wt 232.2 lb

## 2016-12-29 DIAGNOSIS — Z1159 Encounter for screening for other viral diseases: Secondary | ICD-10-CM

## 2016-12-29 DIAGNOSIS — D508 Other iron deficiency anemias: Secondary | ICD-10-CM | POA: Diagnosis not present

## 2016-12-29 DIAGNOSIS — I10 Essential (primary) hypertension: Secondary | ICD-10-CM

## 2016-12-29 LAB — FERRITIN: FERRITIN: 35.1 ng/mL (ref 22.0–322.0)

## 2016-12-29 LAB — CBC WITH DIFFERENTIAL/PLATELET
BASOS ABS: 0 10*3/uL (ref 0.0–0.1)
Basophils Relative: 0.5 % (ref 0.0–3.0)
Eosinophils Absolute: 0.1 10*3/uL (ref 0.0–0.7)
Eosinophils Relative: 1.7 % (ref 0.0–5.0)
HCT: 40 % (ref 39.0–52.0)
Hemoglobin: 13.4 g/dL (ref 13.0–17.0)
LYMPHS ABS: 1.4 10*3/uL (ref 0.7–4.0)
Lymphocytes Relative: 22.9 % (ref 12.0–46.0)
MCHC: 33.4 g/dL (ref 30.0–36.0)
MCV: 89 fl (ref 78.0–100.0)
MONO ABS: 0.4 10*3/uL (ref 0.1–1.0)
MONOS PCT: 7.4 % (ref 3.0–12.0)
NEUTROS ABS: 4 10*3/uL (ref 1.4–7.7)
NEUTROS PCT: 67.5 % (ref 43.0–77.0)
PLATELETS: 158 10*3/uL (ref 150.0–400.0)
RBC: 4.5 Mil/uL (ref 4.22–5.81)
RDW: 19.7 % — ABNORMAL HIGH (ref 11.5–15.5)
WBC: 5.9 10*3/uL (ref 4.0–10.5)

## 2016-12-29 LAB — BASIC METABOLIC PANEL
BUN: 24 mg/dL — ABNORMAL HIGH (ref 6–23)
CALCIUM: 9.7 mg/dL (ref 8.4–10.5)
CHLORIDE: 104 meq/L (ref 96–112)
CO2: 29 mEq/L (ref 19–32)
CREATININE: 0.99 mg/dL (ref 0.40–1.50)
GFR: 80.88 mL/min (ref >60.00)
Glucose, Bld: 94 mg/dL (ref 70–99)
Potassium: 4.4 mEq/L (ref 3.5–5.1)
Sodium: 139 mEq/L (ref 135–145)

## 2016-12-29 LAB — IBC PANEL
IRON: 78 ug/dL (ref 42–165)
SATURATION RATIOS: 22.8 % (ref 20.0–50.0)
Transferrin: 244 mg/dL (ref 212.0–360.0)

## 2016-12-29 LAB — HEPATITIS C ANTIBODY: HCV AB: NEGATIVE

## 2016-12-29 NOTE — Progress Notes (Signed)
Pre visit review using our clinic review tool, if applicable. No additional management support is needed unless otherwise documented below in the visit note. 

## 2016-12-29 NOTE — Progress Notes (Signed)
Subjective:  Patient ID: Lee Lee, male    DOB: Apr 05, 1951  Age: 66 y.o. MRN: [id number]  CC: Anemia and Hypertension   HPI Lee Lee presents for Follow-up on hypertension and iron deficiency anemia. Since I last saw him he has seen gastroenterology and was scheduled for upper endoscopy but it had to be canceled due to inclement weather and has been rescheduled within the next 2 weeks. He feels well today and offers no complaints.  Outpatient Medications Prior to Visit  Medication Sig Dispense Refill  . aspirin EC 81 MG tablet Take 1 tablet (81 mg total) by mouth daily. 90 tablet 3  . Azilsartan-Chlorthalidone (EDARBYCLOR) 40-12.5 MG TABS Take 1 tablet by mouth daily. 30 tablet 5  . CIALIS 20 MG tablet TAKE 1 TABLET (20 MG TOTAL) BY MOUTH DAILY AS NEEDED FOR ERECTILE DYSFUNCTION 6 tablet 8  . Fe Cbn-Fe Gluc-FA-B12-C-DSS (FERRALET 90) 90-1 MG TABS Take 1 tablet by mouth daily. 90 each 3  . gabapentin (NEURONTIN) 300 MG capsule Take 300 mg by mouth 3 (three) times daily.    Marland Kitchen L-Methylfolate-Algae (DEPLIN 15 PO) Take by mouth.    . metoprolol succinate (TOPROL-XL) 25 MG 24 hr tablet TAKE 1 TABLET (25 MG TOTAL) BY MOUTH 2 (TWO) TIMES DAILY. TAKE WITH OR IMMEDIATELY FOLLOWING A MEAL. 180 tablet 1  . traZODone (DESYREL) 150 MG tablet Take 1 tablet (150 mg total) by mouth at bedtime as needed for sleep. 90 tablet 3  . vortioxetine HBr (TRINTELLIX) 10 MG TABS Take 10 mg by mouth daily.    . diazepam (VALIUM) 2 MG tablet Take 1 tablet by mouth 30 minutes prior to MRA 1 tablet 0   No facility-administered medications prior to visit.     ROS Review of Systems  Constitutional: Negative for appetite change, diaphoresis, fatigue and unexpected weight change.  HENT: Negative for trouble swallowing and voice change.   Eyes: Negative for visual disturbance.  Respiratory: Negative for cough, chest tightness, shortness of breath and wheezing.   Cardiovascular: Negative for chest pain,  palpitations and leg swelling.  Gastrointestinal: Negative for abdominal pain, blood in stool, constipation, diarrhea, nausea and vomiting.  Endocrine: Negative.   Genitourinary: Negative.   Musculoskeletal: Negative.   Skin: Negative.  Negative for color change and pallor.  Allergic/Immunologic: Negative.   Neurological: Negative.  Negative for dizziness, weakness and numbness.  Hematological: Negative.  Negative for adenopathy. Does not bruise/bleed easily.  Psychiatric/Behavioral: Negative.  Negative for dysphoric mood and sleep disturbance. The patient is not nervous/anxious.     Objective:  BP 134/80 (BP Location: Left Arm, Patient Position: Sitting, Cuff Size: Normal)   Pulse (!) 54   Temp 98 F (36.7 C) (Oral)   Resp 16   Ht 5' 10"  (1.778 m)   Wt 232 lb 4 oz (105.3 kg)   SpO2 97%   BMI 33.32 kg/m   BP Readings from Last 3 Encounters:  12/29/16 134/80  11/17/16 105/60  09/30/16 120/70    Wt Readings from Last 3 Encounters:  12/29/16 232 lb 4 oz (105.3 kg)  09/30/16 242 lb (109.8 kg)  06/15/16 222 lb (100.7 kg)    Physical Exam  Constitutional: He is oriented to person, place, and time. He appears well-developed and well-nourished. No distress.  HENT:  Head: Normocephalic and atraumatic.  Mouth/Throat: Oropharynx is clear and moist. No oropharyngeal exudate.  Eyes: Conjunctivae are normal. Right eye exhibits no discharge. Left eye exhibits no discharge. No scleral icterus.  Neck: Normal  range of motion. Neck supple. No JVD present. No tracheal deviation present. No thyromegaly present.  Cardiovascular: Normal rate, regular rhythm, normal heart sounds and intact distal pulses.  Exam reveals no gallop and no friction rub.   No murmur heard. Pulmonary/Chest: Effort normal and breath sounds normal. No stridor. No respiratory distress. He has no wheezes. He has no rales. He exhibits no tenderness.  Abdominal: Soft. Bowel sounds are normal. He exhibits no distension and  no mass. There is no tenderness. There is no rebound and no guarding.  Musculoskeletal: Normal range of motion. He exhibits no edema, tenderness or deformity.  Lymphadenopathy:    He has no cervical adenopathy.  Neurological: He is oriented to person, place, and time.  Skin: Skin is warm and dry. No rash noted. No erythema. No pallor.  Psychiatric: He has a normal mood and affect. His behavior is normal. Judgment and thought content normal.  Vitals reviewed.   Lab Results  Component Value Date   WBC 5.9 12/29/2016   HGB 13.4 12/29/2016   HCT 40.0 12/29/2016   PLT 158.0 12/29/2016   GLUCOSE 94 12/29/2016   CHOL 183 06/15/2016   TRIG 73.0 06/15/2016   HDL 88.30 06/15/2016   LDLCALC 80 06/15/2016   ALT 16 06/15/2016   AST 23 06/15/2016   NA 139 12/29/2016   K 4.4 12/29/2016   CL 104 12/29/2016   CREATININE 0.99 12/29/2016   BUN 24 (H) 12/29/2016   CO2 29 12/29/2016   TSH 1.11 06/15/2016   INR 1.05 09/24/2012   HGBA1C 5.6 09/30/2016    Mr Angiogram Chest W Wo Contrast  Result Date: 12/06/2016 CLINICAL DATA:  Follow-up ascending thoracic aorta. EXAM: MRA CHEST WITH OR WITHOUT CONTRAST TECHNIQUE: Angiographic images of the chest were obtained using MRA technique with and without intravenous contrast. CONTRAST:  32m MULTIHANCE GADOBENATE DIMEGLUMINE 529 MG/ML IV SOLN COMPARISON:  Cardiac CTA 09/25/2012 FINDINGS: Aorta: Fusiform aneurysm of the ascending thoracic aorta measuring up to 4.2 cm. This is unchanged from the exam in 2013. No evidence for an aortic dissection. Normal aortic arch configuration. The great vessels are patent. Proximal descending thoracic aorta measures up to 3.4 cm. Distal descending thoracic aorta measures roughly 3.2 cm. Heart: Normal size of the heart. No significant pericardial effusion. Pulmonary Arteries:  Main and central pulmonary arteries are patent. Other: No large pleural effusions. No gross lung abnormalities. No gross bone abnormalities.Celiac trunk  and SMA are patent. The proximal renal arteries are patent. IMPRESSION: Stable fusiform aneurysm of the ascending thoracic aorta measuring up to 4.2 cm. Recommend annual imaging followup by CTA or MRA. This recommendation follows 2010 ACCF/AHA/AATS/ACR/ASA/SCA/SCAI/SIR/STS/SVM Guidelines for the Diagnosis and Management of Patients with Thoracic Aortic Disease. Circulation. 2010; 121:: [id number] Signed   By: AMarkus DaftM.D.   On: 12/06/2016 13:25    Assessment & Plan:   SWaylynwas seen today for anemia and hypertension.  Diagnoses and all orders for this visit:  Other iron deficiency anemia- His anemia has resolved and his iron level is normal, I asked him to continue with iron replacement therapy and to proceed with the upper endoscopy. He has had a normal colonoscopy within the last year. -     CBC with Differential/Platelet; Future -     IBC panel; Future -     Ferritin; Future  Essential hypertension- his blood pressure is well-controlled, electrolytes and renal function are normal. -     Basic metabolic panel; Future  Need for hepatitis C  screening test- he is negative for hepatitis C exposure -     Hepatitis C antibody; Future   I have discontinued Mr. Piltz's diazepam. I am also having him maintain his CIALIS, vortioxetine HBr, L-Methylfolate-Algae (DEPLIN 15 PO), gabapentin, traZODone, FERRALET 90, metoprolol succinate, aspirin EC, and Azilsartan-Chlorthalidone.  No orders of the defined types were placed in this encounter.    Follow-up: Return in about 4 months (around 04/28/2017).  Scarlette Calico, MD

## 2016-12-29 NOTE — Patient Instructions (Signed)
Iron Deficiency Anemia, Adult Iron deficiency anemia is a condition in which the concentration of red blood cells or hemoglobin in the blood is below normal because of too little iron. Hemoglobin is a substance in red blood cells that carries oxygen to the body's tissues. When the concentration of red blood cells or hemoglobin is too low, not enough oxygen reaches these tissues. Iron deficiency anemia is usually long-lasting (chronic) and it develops over time. It may or may not cause symptoms. It is a common type of anemia. What are the causes? This condition may be caused by:  Not enough iron in the diet.  Blood loss caused by bleeding in the intestine.  Blood loss from a gastrointestinal condition like Crohn disease.  Frequent blood draws, such as from blood donation.  Abnormal absorption in the gut.  Heavy menstrual periods in women.  Cancers of the gastrointestinal system, such as colon cancer. What are the signs or symptoms? Symptoms of this condition may include:  Fatigue.  Headache.  Pale skin, lips, and nail beds.  Poor appetite.  Weakness.  Shortness of breath.  Dizziness.  Cold hands and feet.  Fast or irregular heartbeat.  Irritability. This is more common in severe anemia.  Rapid breathing. This is more common in severe anemia. Mild anemia may not cause any symptoms. How is this diagnosed? This condition is diagnosed based on:  Your medical history.  A physical exam.  Blood tests. You may have additional tests to find the underlying cause of your anemia, such as:  Testing for blood in the stool (fecal occult blood test).  A procedure to see inside your colon and rectum (colonoscopy).  A procedure to see inside your esophagus and stomach (endoscopy).  A test in which cells are removed from bone marrow (bone marrow aspiration) or fluid is removed from the bone marrow to be examined (biopsy). This is rarely needed. How is this treated? This  condition is treated by correcting the cause of your iron deficiency. Treatment may involve:  Adding iron-rich foods to your diet.  Taking iron supplements. If you are pregnant or breastfeeding, you may need to take extra iron because your normal diet usually does not provide the amount of iron that you need.  Increasing vitamin C intake. Vitamin C helps your body absorb iron. Your health care provider may recommend that you take iron supplements along with a glass of orange juice or a vitamin C supplement.  Medicines to make heavy menstrual flow lighter.  Surgery. You may need repeat blood tests to determine whether treatment is working. Depending on the underlying cause, the anemia should be corrected within 2 months of starting treatment. If the treatment does not seem to be working, you may need more testing. Follow these instructions at home: Medicines  Take over-the-counter and prescription medicines only as told by your health care provider. This includes iron supplements and vitamins.  If you cannot tolerate taking iron supplements by mouth, talk with your health care provider about taking them through a vein (intravenously) or an injection into a muscle.  For the best iron absorption, you should take iron supplements when your stomach is empty. If you cannot tolerate them on an empty stomach, you may need to take them with food.  Do not drink milk or take antacids at the same time as your iron supplements. Milk and antacids may interfere with iron absorption.  Iron supplements can cause constipation. To prevent constipation, include fiber in your diet as told  by your health care provider. A stool softener may also be recommended. Eating and drinking  Talk with your health care provider before changing your diet. He or she may recommend that you eat foods that contain a lot of iron, such as:  Liver.  Low-fat (lean) beef.  Breads and cereals that have iron added to them (are  fortified).  Eggs.  Dried fruit.  Dark green, leafy vegetables.  To help your body use the iron from iron-rich foods, eat those foods at the same time as fresh fruits and vegetables that are high in vitamin C. Foods that are high in vitamin C include:  Oranges.  Peppers.  Tomatoes.  Mangoes.  Drinkenoughfluid to keep your urine clear or pale yellow. General instructions  Return to your normal activities as told by your health care provider. Ask your health care provider what activities are safe for you.  Practice good hygiene. Anemia can make you more prone to illness and infection.  Keep all follow-up visits as told by your health care provider. This is important. Contact a health care provider if:  You feel nauseous or you vomit.  You feel weak.  You have unexplained sweating.  You develop symptoms of constipation, such as:  Having fewer than three bowel movements a week.  Straining to have a bowel movement.  Having stools that are hard, dry, or larger than normal.  Feeling full or bloated.  Pain in the lower abdomen.  Not feeling relief after having a bowel movement. Get help right away if:  You faint. If this happens, do not drive yourself to the hospital. Call your local emergency services (911 in the [place]  You have chest pain.  You have shortness of breath that:  Is severe.  Gets worse with physical activity.  You have a rapid heartbeat.  You become light-headed when getting up from a sitting or lying down position. This information is not intended to replace advice given to you by your health care provider. Make sure you discuss any questions you have with your health care provider. Document Released: 11/12/2000 Document Revised: 08/04/2016 Document Reviewed: 08/04/2016 Elsevier Interactive Patient Education  2017 [employer].

## 2016-12-30 ENCOUNTER — Encounter: Payer: Self-pay | Admitting: Internal Medicine

## 2017-01-11 ENCOUNTER — Telehealth: Payer: Self-pay | Admitting: Internal Medicine

## 2017-01-11 NOTE — Telephone Encounter (Signed)
FYI: Pt was referred to Dr. Watt Climes. They called stating pt rescheduled his endoscopy 3 x and lastly declined due to not meeting his deductible.

## 2017-04-03 ENCOUNTER — Other Ambulatory Visit: Payer: Self-pay | Admitting: Family Medicine

## 2017-04-07 ENCOUNTER — Encounter: Payer: Self-pay | Admitting: Internal Medicine

## 2017-04-11 MED ORDER — SILDENAFIL CITRATE 20 MG PO TABS: ORAL_TABLET | ORAL | 0 refills | Status: DC

## 2017-04-11 NOTE — Addendum Note (Signed)
Addended by: Mauricio Po D on: 04/11/2017 10:48 PM   Modules accepted: Orders

## 2017-04-19 ENCOUNTER — Other Ambulatory Visit: Payer: Self-pay | Admitting: Family

## 2017-04-19 NOTE — Telephone Encounter (Signed)
Last refill was 04/12/17

## 2017-04-26 ENCOUNTER — Encounter: Payer: Self-pay | Admitting: Internal Medicine

## 2017-04-27 ENCOUNTER — Ambulatory Visit (INDEPENDENT_AMBULATORY_CARE_PROVIDER_SITE_OTHER): Payer: BLUE CROSS/BLUE SHIELD | Admitting: Internal Medicine

## 2017-04-27 ENCOUNTER — Encounter: Payer: Self-pay | Admitting: Internal Medicine

## 2017-04-27 VITALS — BP 138/84 | HR 62 | Ht 71.0 in | Wt 233.0 lb

## 2017-04-27 DIAGNOSIS — I10 Essential (primary) hypertension: Secondary | ICD-10-CM | POA: Diagnosis not present

## 2017-04-27 DIAGNOSIS — I471 Supraventricular tachycardia: Secondary | ICD-10-CM | POA: Diagnosis not present

## 2017-04-27 NOTE — Patient Instructions (Addendum)
Medication Instructions:  Your physician recommends that you continue on your current medications as directed. Please refer to the Current Medication list given to you today.   Labwork: None Ordered   Testing/Procedures: None Ordered   Follow-Up: Cancel appointment in July with Dr. [name].   Your physician wants you to follow-up in: 6 months with Dr. [name]. You will receive a reminder letter in the mail two months in advance. If you don't receive a letter, please call our office to schedule the follow-up appointment.   Any Other Special Instructions Will Be Listed Below (If Applicable). Call our office if you decide to wear event monitor.    If you need a refill on your cardiac medications before your next appointment, please call your pharmacy.

## 2017-04-27 NOTE — Progress Notes (Signed)
HPI Mr. Mackie returns today for followup. He is a very pleasant 67 year old man with a history of atrial flutter, status post catheter ablation. 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 He has done well for over a year when he developed the sensation of hard heart beats and then had some irregular heart beats where his HR was in the 80-100 range. The patient admits to have run out of his beta blockers. No chest pain. He has retired and remains active. He has been discovered to have an aortic aneurysm which has so far been stable.   No Known Allergies   Current Outpatient Prescriptions  Medication Sig Dispense Refill  . aspirin EC 81 MG tablet Take 1 tablet (81 mg total) by mouth daily. 90 tablet 3  . Azilsartan-Chlorthalidone (EDARBYCLOR) 40-12.5 MG TABS Take 1 tablet by mouth daily. 30 tablet 5  . Fe Cbn-Fe Gluc-FA-B12-C-DSS (FERRALET 90) 90-1 MG TABS Take 1 tablet by mouth daily. 90 each 3  . gabapentin (NEURONTIN) 300 MG capsule Take 300 mg by mouth 3 (three) times daily.    Marland Kitchen L-Methylfolate-Algae (DEPLIN 15 PO) Take by mouth.    . metoprolol succinate (TOPROL-XL) 25 MG 24 hr tablet TAKE 1 TABLET (25 MG TOTAL) BY MOUTH 2 (TWO) TIMES DAILY. TAKE WITH OR IMMEDIATELY FOLLOWING A MEAL. 180 tablet 1  . sildenafil (REVATIO) 20 MG tablet TAKE ONE TO FIVE TABLETS BY MOUTH DAILY AS NEEDED 50 tablet 0  . traZODone (DESYREL) 150 MG tablet Take 0.5 tablet by mouth daily at bedtime as needed for sleep    . vortioxetine HBr (TRINTELLIX) 10 MG TABS Take 10 mg by mouth daily.     No current facility-administered medications for this visit.      Past Medical History:  Diagnosis Date  . Alcohol abuse, episodic drinking behavior   . Arthritis    back & knees  . Atrial flutter ([place])    s/p RFCA 01/05/13  . Calcium oxalate renal stones   . Complication of  anesthesia    makes him loopy  . ED (erectile dysfunction)   . Hemorrhoids   . Hypertension     ROS:   All systems reviewed and negative except as noted in the HPI.   Past Surgical History:  Procedure Laterality Date  . ABLATION OF DYSRHYTHMIC FOCUS  01/05/2013  . ARTHROSCOPIC REPAIR ACL    . ATRIAL FLUTTER ABLATION N/A 01/05/2013   Procedure: ATRIAL FLUTTER ABLATION;  Surgeon: Evans Lance, MD;  Location: [facility] CATH LAB;  Service: Cardiovascular;  Laterality: N/A;  . COLONOSCOPY  11/2009   Dr. Benson Norway  . ELECTROPHYSIOLOGIC STUDY N/A 07/04/2015   Procedure: A-Flutter Ablation;  Surgeon: Evans Lance, MD;  Location: [facility] CV LAB;  Service: Cardiovascular;  Laterality: N/A;  . ROTATOR CUFF REPAIR       Family History  Problem Relation Age of Onset  . Valvular heart disease Mother   . Lymphoma Mother      Social History   Social History  . Marital status: Married    Spouse name: N/A  . Number of children: N/A  . Years of education: N/A   Occupational History  . Not on file.   Social History Main Topics  . Smoking status: Former Smoker    Quit date: 01/06/1988  . Smokeless tobacco: Current User    Types: Chew  Comment: chews tobacco when playing golf only  . Alcohol use 3.0 oz/week    5 Glasses of wine per week     Comment: wine daily  . Drug use: No  . Sexual activity: Yes   Other Topics Concern  . Not on file   Social History Narrative  . No narrative on file     BP 138/84   Pulse 62   Ht 5' 11"  (1.803 m)   Wt 233 lb (105.7 kg)   SpO2 98%   BMI 32.50 kg/m   Physical Exam:  Well appearing middle-aged man,NAD HEENT: Unremarkable Neck:  6 cm JVD, no thyromegally Back:  No CVA tenderness Lungs:  Clear with no wheezes, rales, or rhonchi. HEART:  Regular rate rhythm, no murmurs, no rubs, no clicks Abd:  soft, positive bowel sounds, no organomegally, no rebound, no guarding Ext:  2 plus pulses, no edema, no cyanosis, no clubbing Skin:  No  rashes no nodules Neuro:  CN II through XII intact, motor grossly intact  EKG - normal sinus rhythm with incomplete right bundle branch block.  Assess/Plan: 1. Palpitations - I have discussed the treatment options with the patient which include watchful waiting, wearing a cardiac monitor or obtaining a heart monitor from the [employer] store. He will call us if he wishes to wear a 30 day monitor. He has been instructed to go back on his beta blocker.  2. HTN - he is encouraged to lose weight and to continue his beta blocker. 3. Ascending aortic aneurysm - he will undergo a repeat CT or MRI in a year. 4. SVT/Atrial flutter - his current symptoms do not sound like either. I wonder if he is having PAF. If discovered he would need to be placed on anti-coagulation.  Mikle Bosworth.D.

## 2017-04-28 ENCOUNTER — Encounter: Payer: Self-pay | Admitting: Internal Medicine

## 2017-04-28 ENCOUNTER — Ambulatory Visit (INDEPENDENT_AMBULATORY_CARE_PROVIDER_SITE_OTHER): Payer: BLUE CROSS/BLUE SHIELD | Admitting: Internal Medicine

## 2017-04-28 ENCOUNTER — Other Ambulatory Visit (INDEPENDENT_AMBULATORY_CARE_PROVIDER_SITE_OTHER): Payer: BLUE CROSS/BLUE SHIELD

## 2017-04-28 VITALS — BP 120/76 | HR 51 | Temp 98.6°F | Ht 71.0 in | Wt 228.0 lb

## 2017-04-28 DIAGNOSIS — I10 Essential (primary) hypertension: Secondary | ICD-10-CM

## 2017-04-28 DIAGNOSIS — D508 Other iron deficiency anemias: Secondary | ICD-10-CM

## 2017-04-28 DIAGNOSIS — Z23 Encounter for immunization: Secondary | ICD-10-CM | POA: Diagnosis not present

## 2017-04-28 DIAGNOSIS — E785 Hyperlipidemia, unspecified: Secondary | ICD-10-CM | POA: Diagnosis not present

## 2017-04-28 LAB — CBC WITH DIFFERENTIAL/PLATELET
Basophils Absolute: 0 10*3/uL (ref 0.0–0.1)
Basophils Relative: 0.3 % (ref 0.0–3.0)
EOS PCT: 1.9 % (ref 0.0–5.0)
Eosinophils Absolute: 0.1 10*3/uL (ref 0.0–0.7)
HCT: 42.2 % (ref 39.0–52.0)
Hemoglobin: 14.6 g/dL (ref 13.0–17.0)
LYMPHS ABS: 1.3 10*3/uL (ref 0.7–4.0)
Lymphocytes Relative: 18.2 % (ref 12.0–46.0)
MCHC: 34.5 g/dL (ref 30.0–36.0)
MCV: 99.4 fl (ref 78.0–100.0)
MONOS PCT: 7.5 % (ref 3.0–12.0)
Monocytes Absolute: 0.5 10*3/uL (ref 0.1–1.0)
NEUTROS PCT: 72.1 % (ref 43.0–77.0)
Neutro Abs: 5 10*3/uL (ref 1.4–7.7)
Platelets: 160 10*3/uL (ref 150.0–400.0)
RBC: 4.24 Mil/uL (ref 4.22–5.81)
RDW: 13.6 % (ref 11.5–15.5)
WBC: 6.9 10*3/uL (ref 4.0–10.5)

## 2017-04-28 LAB — LIPID PANEL
Cholesterol: 194 mg/dL (ref 0–200)
HDL: 72.3 mg/dL (ref >39.00)
LDL CALC: 105 mg/dL — AB (ref 0–99)
NONHDL: 121.84
Total CHOL/HDL Ratio: 3
Triglycerides: 82 mg/dL (ref 0.0–149.0)
VLDL: 16.4 mg/dL (ref 0.0–40.0)

## 2017-04-28 LAB — BASIC METABOLIC PANEL
BUN: 15 mg/dL (ref 6–23)
CALCIUM: 9.5 mg/dL (ref 8.4–10.5)
CO2: 30 meq/L (ref 19–32)
Chloride: 101 mEq/L (ref 96–112)
Creatinine, Ser: 0.85 mg/dL (ref 0.40–1.50)
GFR: 96.34 mL/min (ref >60.00)
Glucose, Bld: 103 mg/dL — ABNORMAL HIGH (ref 70–99)
Potassium: 4.1 mEq/L (ref 3.5–5.1)
SODIUM: 137 meq/L (ref 135–145)

## 2017-04-28 NOTE — Progress Notes (Signed)
Subjective:  Patient ID: Kaliel Bolds, male    DOB: 1950/12/17  Age: 66 y.o. MRN: [id number]  CC: Hypertension   HPI Calum Cormier presents for A blood pressure check and follow-up on anemia. He has decided not to undergo a GI workup. He is taking the iron supplement. He's had no recent episodes of blood loss. He denies abdominal pain, nausea, vomiting, dysphagia, or odynophagia. He also tells me his blood pressure is well-controlled and he has had no recent episodes of headache/blurred vision/chest pain/shortness of breath/palpitations/edema/fatigue.  Outpatient Medications Prior to Visit  Medication Sig Dispense Refill  . aspirin EC 81 MG tablet Take 1 tablet (81 mg total) by mouth daily. 90 tablet 3  . Azilsartan-Chlorthalidone (EDARBYCLOR) 40-12.5 MG TABS Take 1 tablet by mouth daily. 30 tablet 5  . Fe Cbn-Fe Gluc-FA-B12-C-DSS (FERRALET 90) 90-1 MG TABS Take 1 tablet by mouth daily. 90 each 3  . L-Methylfolate-Algae (DEPLIN 15 PO) Take by mouth.    . metoprolol succinate (TOPROL-XL) 25 MG 24 hr tablet TAKE 1 TABLET (25 MG TOTAL) BY MOUTH 2 (TWO) TIMES DAILY. TAKE WITH OR IMMEDIATELY FOLLOWING A MEAL. 180 tablet 1  . sildenafil (REVATIO) 20 MG tablet TAKE ONE TO FIVE TABLETS BY MOUTH DAILY AS NEEDED 50 tablet 0  . traZODone (DESYREL) 150 MG tablet Take 0.5 tablet by mouth daily at bedtime as needed for sleep    . vortioxetine HBr (TRINTELLIX) 10 MG TABS Take 10 mg by mouth daily.    Marland Kitchen gabapentin (NEURONTIN) 300 MG capsule Take 300 mg by mouth 3 (three) times daily.     No facility-administered medications prior to visit.     ROS Review of Systems  Constitutional: Negative for activity change, appetite change, diaphoresis, fatigue and unexpected weight change.  HENT: Negative.  Negative for trouble swallowing.   Eyes: Negative.   Respiratory: Negative.  Negative for cough, chest tightness, shortness of breath and wheezing.   Cardiovascular: Negative for chest pain, palpitations  and leg swelling.  Gastrointestinal: Negative for abdominal pain, blood in stool, constipation, diarrhea, nausea and vomiting.  Endocrine: Negative.   Genitourinary: Negative.  Negative for difficulty urinating and hematuria.  Musculoskeletal: Negative.  Negative for back pain, myalgias and neck pain.  Skin: Negative.  Negative for color change.  Allergic/Immunologic: Negative.   Neurological: Negative.   Hematological: Negative for adenopathy. Does not bruise/bleed easily.  Psychiatric/Behavioral: Negative.  Negative for decreased concentration, dysphoric mood and sleep disturbance. The patient is not nervous/anxious.     Objective:  BP 120/76 (BP Location: Left Arm, Patient Position: Sitting, Cuff Size: Normal)   Pulse (!) 51   Temp 98.6 F (37 C) (Oral)   Ht 5' 11"  (1.803 m)   Wt 228 lb (103.4 kg)   SpO2 98%   BMI 31.80 kg/m   BP Readings from Last 3 Encounters:  04/28/17 120/76  04/27/17 138/84  12/29/16 134/80    Wt Readings from Last 3 Encounters:  04/28/17 228 lb (103.4 kg)  04/27/17 233 lb (105.7 kg)  12/29/16 232 lb 4 oz (105.3 kg)    Physical Exam  Constitutional: He is oriented to person, place, and time. No distress.  HENT:  Mouth/Throat: Oropharynx is clear and moist. No oropharyngeal exudate.  Eyes: Conjunctivae are normal. Right eye exhibits no discharge. Left eye exhibits no discharge. No scleral icterus.  Neck: Normal range of motion. Neck supple. No JVD present. No thyromegaly present.  Cardiovascular: Normal rate.   No murmur heard. Pulmonary/Chest: Effort normal and  breath sounds normal. He has no wheezes. He has no rales.  Abdominal: Soft. Bowel sounds are normal. He exhibits no distension and no mass. There is no tenderness. There is no rebound and no guarding.  Musculoskeletal: Normal range of motion. He exhibits no edema, tenderness or deformity.  Lymphadenopathy:    He has no cervical adenopathy.  Neurological: He is alert and oriented to  person, place, and time.  Skin: Skin is warm and dry. No rash noted. He is not diaphoretic. No erythema. No pallor.  Vitals reviewed.   Lab Results  Component Value Date   WBC 6.9 04/28/2017   HGB 14.6 04/28/2017   HCT 42.2 04/28/2017   PLT 160.0 04/28/2017   GLUCOSE 103 (H) 04/28/2017   CHOL 194 04/28/2017   TRIG 82.0 04/28/2017   HDL 72.30 04/28/2017   LDLCALC 105 (H) 04/28/2017   ALT 16 06/15/2016   AST 23 06/15/2016   NA 137 04/28/2017   K 4.1 04/28/2017   CL 101 04/28/2017   CREATININE 0.85 04/28/2017   BUN 15 04/28/2017   CO2 30 04/28/2017   TSH 1.11 06/15/2016   INR 1.05 09/24/2012   HGBA1C 5.6 09/30/2016    Mr Angiogram Chest W Wo Contrast  Result Date: 12/06/2016 CLINICAL DATA:  Follow-up ascending thoracic aorta. EXAM: MRA CHEST WITH OR WITHOUT CONTRAST TECHNIQUE: Angiographic images of the chest were obtained using MRA technique with and without intravenous contrast. CONTRAST:  85m MULTIHANCE GADOBENATE DIMEGLUMINE 529 MG/ML IV SOLN COMPARISON:  Cardiac CTA 09/25/2012 FINDINGS: Aorta: Fusiform aneurysm of the ascending thoracic aorta measuring up to 4.2 cm. This is unchanged from the exam in 2013. No evidence for an aortic dissection. Normal aortic arch configuration. The great vessels are patent. Proximal descending thoracic aorta measures up to 3.4 cm. Distal descending thoracic aorta measures roughly 3.2 cm. Heart: Normal size of the heart. No significant pericardial effusion. Pulmonary Arteries:  Main and central pulmonary arteries are patent. Other: No large pleural effusions. No gross lung abnormalities. No gross bone abnormalities.Celiac trunk and SMA are patent. The proximal renal arteries are patent. IMPRESSION: Stable fusiform aneurysm of the ascending thoracic aorta measuring up to 4.2 cm. Recommend annual imaging followup by CTA or MRA. This recommendation follows 2010 ACCF/AHA/AATS/ACR/ASA/SCA/SCAI/SIR/STS/SVM Guidelines for the Diagnosis and Management of  Patients with Thoracic Aortic Disease. Circulation. 2010; 121:: [id number] Signed   By: AMarkus DaftM.D.   On: 12/06/2016 13:25    Assessment & Plan:   SKamerynwas seen today for hypertension.  Diagnoses and all orders for this visit:  Need for Tdap vaccination -     Tdap vaccine greater than or equal to 7yo IM  Essential hypertension- his blood pressure is well-controlled, electrolytes and renal function are normal. -     Basic metabolic panel; Future  Hyperlipidemia with target LDL less than 130- his Framingham risk score is only 8% so I do not recommend that he start a statin for cardiovascular risk reduction -     Lipid panel; Future  Other iron deficiency anemia- his H&H are normal now, he has no GI symptoms, will continue iron at the current dose. -     CBC with Differential/Platelet; Future  Need for Streptococcus pneumoniae vaccination -     Pneumococcal conjugate vaccine 13-valent   I have discontinued Mr. Kump's gabapentin. I am also having him maintain his vortioxetine HBr, L-Methylfolate-Algae (DEPLIN 15 PO), FERRALET 90, metoprolol succinate, aspirin EC, Azilsartan-Chlorthalidone, sildenafil, and traZODone.  No orders of the  defined types were placed in this encounter.    Follow-up: Return in about 6 months (around 10/28/2017).  Scarlette Calico, MD

## 2017-04-28 NOTE — Patient Instructions (Signed)

## 2017-05-14 ENCOUNTER — Other Ambulatory Visit: Payer: Self-pay | Admitting: Cardiology

## 2017-05-14 DIAGNOSIS — I712 Thoracic aortic aneurysm, without rupture: Secondary | ICD-10-CM

## 2017-05-14 DIAGNOSIS — I7121 Aneurysm of the ascending aorta, without rupture: Secondary | ICD-10-CM

## 2017-05-14 DIAGNOSIS — I10 Essential (primary) hypertension: Secondary | ICD-10-CM

## 2017-05-16 NOTE — Telephone Encounter (Signed)
Followed by [name]

## 2017-06-06 ENCOUNTER — Ambulatory Visit (INDEPENDENT_AMBULATORY_CARE_PROVIDER_SITE_OTHER): Payer: BLUE CROSS/BLUE SHIELD | Admitting: Family Medicine

## 2017-06-06 ENCOUNTER — Encounter: Payer: Self-pay | Admitting: Family Medicine

## 2017-06-06 VITALS — BP 148/82 | HR 58 | Temp 98.3°F | Resp 12 | Ht 71.0 in | Wt 232.0 lb

## 2017-06-06 DIAGNOSIS — R21 Rash and other nonspecific skin eruption: Secondary | ICD-10-CM | POA: Diagnosis not present

## 2017-06-06 NOTE — Progress Notes (Signed)
Subjective:    Patient ID: Lee Lee, male    DOB: Aug 30, 1951, 66 y.o.   MRN: [id number]  HPI This is a 66 yo male who presents today with intermittent hives x 4 days. Was at the beach, was bitten by green headed flies. Was using a home remedy of Skin So Soft and Listerine which stopped the bug bites. Took some benadryl last night and this morning, lesions worse yesterday. Only occurred on inner, upper thighs. Resolved within a couple of hours. No rash today. He has pictures of scattered wheals on upper thighs.   Past Medical History:  Diagnosis Date  . Alcohol abuse, episodic drinking behavior   . Arthritis    back & knees  . Atrial flutter ([place])    s/p RFCA 01/05/13  . Calcium oxalate renal stones   . Complication of anesthesia    makes him loopy  . ED (erectile dysfunction)   . Hemorrhoids   . Hypertension    Past Surgical History:  Procedure Laterality Date  . ABLATION OF DYSRHYTHMIC FOCUS  01/05/2013  . ARTHROSCOPIC REPAIR ACL    . ATRIAL FLUTTER ABLATION N/A 01/05/2013   Procedure: ATRIAL FLUTTER ABLATION;  Surgeon: Evans Lance, MD;  Location: [facility] CATH LAB;  Service: Cardiovascular;  Laterality: N/A;  . COLONOSCOPY  11/2009   Dr. Benson Norway  . ELECTROPHYSIOLOGIC STUDY N/A 07/04/2015   Procedure: A-Flutter Ablation;  Surgeon: Evans Lance, MD;  Location: [place] CV LAB;  Service: Cardiovascular;  Laterality: N/A;  . ROTATOR CUFF REPAIR     Family History  Problem Relation Age of Onset  . Valvular heart disease Mother   . Lymphoma Mother    Social History  Substance Use Topics  . Smoking status: Former Smoker    Quit date: 01/06/1988  . Smokeless tobacco: Current User    Types: Chew     Comment: chews tobacco when playing golf only  . Alcohol use 3.0 oz/week    5 Glasses of wine per week     Comment: wine daily      Review of Systems  Skin: Positive for rash.       Objective:   Physical Exam  Constitutional: He appears well-developed and  well-nourished. No distress.  HENT:  Head: Normocephalic and atraumatic.  Cardiovascular: Normal rate.   Pulmonary/Chest: Effort normal.  Skin: Skin is warm and dry. No rash noted. He is not diaphoretic.  Psychiatric: He has a normal mood and affect. His behavior is normal. Judgment and thought content normal.  Vitals reviewed.     BP (!) 170/90 (BP Location: Left Arm, Patient Position: Sitting, Cuff Size: Normal)   Pulse (!) 58   Temp 98.3 F (36.8 C) (Oral)   Resp 12   Ht 5' 11"  (1.803 m)   Wt 232 lb (105.2 kg)   SpO2 99%   BMI 32.36 kg/m  Wt Readings from Last 3 Encounters:  06/06/17 232 lb (105.2 kg)  04/28/17 228 lb (103.4 kg)  04/27/17 233 lb (105.7 kg)   BP Readings from Last 3 Encounters:  06/06/17 (!) 170/90  04/28/17 120/76  04/27/17 138/84   Recheck blood pressure 148/82     Assessment & Plan:  1. Rash - pictures look like hives, none today - Provided written and verbal information regarding diagnosis and treatment. - suggested he try long acting antihistamine for next 5-7 days   Clarene Reamer, FNP-BC  [facility], [facility]  06/06/2017 11:15 AM

## 2017-06-06 NOTE — Patient Instructions (Signed)
Try over the counter Zyrtec (generic version- cetirizine is fine) daily for the next 5-7 days.     Hives Hives (urticaria) are itchy, red, swollen areas on your skin. Hives can appear on any part of your body and can vary in size. They can be as small as the tip of a pen or much larger. Hives often fade within 24 hours (acute hives). In other cases, new hives appear after old ones fade. This cycle can continue for several days or weeks (chronic hives). Hives result from your body's reaction to an irritant or to something that you are allergic to (trigger). When you are exposed to a trigger, your body releases a chemical (histamine) that causes redness, itching, and swelling. You can get hives immediately after being exposed to a trigger or hours later. Hives do not spread from person to person (are not contagious). Your hives may get worse with scratching, exercise, and emotional stress. What are the causes? Causes of this condition include:  Allergies to certain foods or ingredients.  Insect bites or stings.  Exposure to pollen or pet dander.  Contact with latex or chemicals.  Spending time in sunlight, heat, or cold (exposure).  Exercise.  Stress.  You can also get hives from some medical conditions and treatments. These include:  Viruses, including the common cold.  Bacterial infections, such as urinary tract infections and strep throat.  Disorders such as vasculitis, lupus, or thyroid disease.  Certain medications.  Allergy shots.  Blood transfusions.  Sometimes, the cause of hives is not known (idiopathic hives). What increases the risk? This condition is more likely to develop in:  Women.  People who have food allergies, especially to citrus fruits, milk, eggs, peanuts, tree nuts, or shellfish.  People who are allergic to: ? Medicines. ? Latex. ? Insects. ? Animals. ? Pollen.  People who have certain medical conditions, includinglupus or thyroid  disease.  What are the signs or symptoms? The main symptom of this condition is raised, itchyred or white bumps or patches on your skin. These areas may:  Become large and swollen (welts).  Change in shape and location, quickly and repeatedly.  Be separate hives or connect over a large area of skin.  Sting or become painful.  Turn white when pressed in the center (blanch).  In severe cases, yourhands, feet, and face may also become swollen. This may occur if hives develop deeper in your skin. How is this diagnosed? This condition is diagnosed based on your symptoms, medical history, and physical exam. Your skin, urine, or blood may be tested to find out what is causing your hives and to rule out other health issues. Your health care provider may also remove a small sample of skin from the affected area and examine it under a microscope (biopsy). How is this treated? Treatment depends on the severity of your condition. Your health care provider may recommend using cool, wet cloths (cool compresses) or taking cool showers to relieve itching. Hives are sometimes treated with medicines, including:  Antihistamines.  Corticosteroids.  Antibiotics.  An injectable medicine (omalizumab). Your health care provider may prescribe this if you have chronic idiopathic hives and you continue to have symptoms even after treatment with antihistamines.  Severe cases may require an emergency injection of adrenaline (epinephrine) to prevent a life-threatening allergic reaction (anaphylaxis). Follow these instructions at home: Medicines  Take or apply over-the-counter and prescription medicines only as told by your health care provider.  If you were prescribed an antibiotic  medicine, use it as told by your health care provider. Do not stop taking the antibiotic even if you start to feel better. Skin Care  Apply cool compresses to the affected areas.  Do not scratch or rub your skin. General  instructions  Do not take hot showers or baths. This can make itching worse.  Do not wear tight-fitting clothing.  Use sunscreen and wear protective clothing when you are outside.  Avoid any substances that cause your hives. Keep a journal to help you track what causes your hives. Write down: ? What medicines you take. ? What you eat and drink. ? What products you use on your skin.  Keep all follow-up visits as told by your health care provider. This is important. Contact a health care provider if:  Your symptoms are not controlled with medicine.  Your joints are painful or swollen. Get help right away if:  You have a fever.  You have pain in your abdomen.  Your tongue or lips are swollen.  Your eyelids are swollen.  Your chest or throat feels tight.  You have trouble breathing or swallowing. These symptoms may represent a serious problem that is an emergency. Do not wait to see if the symptoms will go away. Get medical help right away. Call your local emergency services (911 in the [place] Do not drive yourself to the hospital. This information is not intended to replace advice given to you by your health care provider. Make sure you discuss any questions you have with your health care provider. Document Released: 11/15/2005 Document Revised: 04/14/2016 Document Reviewed: 09/03/2015 Elsevier Interactive Patient Education  2017 [employer].

## 2017-06-07 ENCOUNTER — Ambulatory Visit: Payer: BLUE CROSS/BLUE SHIELD | Admitting: Internal Medicine

## 2017-06-11 ENCOUNTER — Other Ambulatory Visit: Payer: Self-pay | Admitting: Family

## 2017-06-22 ENCOUNTER — Encounter: Payer: Self-pay | Admitting: Internal Medicine

## 2017-06-22 ENCOUNTER — Other Ambulatory Visit: Payer: Self-pay | Admitting: Internal Medicine

## 2017-06-22 DIAGNOSIS — I712 Thoracic aortic aneurysm, without rupture: Secondary | ICD-10-CM

## 2017-06-22 DIAGNOSIS — I10 Essential (primary) hypertension: Secondary | ICD-10-CM

## 2017-06-22 DIAGNOSIS — I7121 Aneurysm of the ascending aorta, without rupture: Secondary | ICD-10-CM

## 2017-06-22 MED ORDER — AZILSARTAN-CHLORTHALIDONE 40-12.5 MG PO TABS: 1.0000 | ORAL_TABLET | Freq: Every day | ORAL | 5 refills | Status: DC

## 2017-09-08 ENCOUNTER — Emergency Department (HOSPITAL_COMMUNITY): Payer: BLUE CROSS/BLUE SHIELD

## 2017-09-08 ENCOUNTER — Encounter (HOSPITAL_COMMUNITY): Payer: Self-pay

## 2017-09-08 ENCOUNTER — Emergency Department (HOSPITAL_COMMUNITY)
Admission: EM | Admit: 2017-09-08 | Discharge: 2017-09-08 | Disposition: A | Discharge status: Home / Self Care | Payer: BLUE CROSS/BLUE SHIELD | Attending: Emergency Medicine | Admitting: Emergency Medicine

## 2017-09-08 DIAGNOSIS — Y939 Activity, unspecified: Secondary | ICD-10-CM | POA: Diagnosis not present

## 2017-09-08 DIAGNOSIS — S76191A Other specified injury of right quadriceps muscle, fascia and tendon, initial encounter: Secondary | ICD-10-CM | POA: Diagnosis not present

## 2017-09-08 DIAGNOSIS — Y929 Unspecified place or not applicable: Secondary | ICD-10-CM | POA: Insufficient documentation

## 2017-09-08 DIAGNOSIS — Y999 Unspecified external cause status: Secondary | ICD-10-CM | POA: Insufficient documentation

## 2017-09-08 DIAGNOSIS — Z7982 Long term (current) use of aspirin: Secondary | ICD-10-CM | POA: Insufficient documentation

## 2017-09-08 DIAGNOSIS — Z87891 Personal history of nicotine dependence: Secondary | ICD-10-CM | POA: Insufficient documentation

## 2017-09-08 DIAGNOSIS — I10 Essential (primary) hypertension: Secondary | ICD-10-CM | POA: Insufficient documentation

## 2017-09-08 DIAGNOSIS — S8991XA Unspecified injury of right lower leg, initial encounter: Secondary | ICD-10-CM | POA: Diagnosis present

## 2017-09-08 DIAGNOSIS — W010XXA Fall on same level from slipping, tripping and stumbling without subsequent striking against object, initial encounter: Secondary | ICD-10-CM | POA: Diagnosis not present

## 2017-09-08 DIAGNOSIS — S76111A Strain of right quadriceps muscle, fascia and tendon, initial encounter: Secondary | ICD-10-CM

## 2017-09-08 DIAGNOSIS — Z79899 Other long term (current) drug therapy: Secondary | ICD-10-CM | POA: Diagnosis not present

## 2017-09-08 MED ORDER — HYDROCODONE-ACETAMINOPHEN 5-325 MG PO TABS: 1.0000 | ORAL_TABLET | ORAL | 0 refills | Status: DC | PRN

## 2017-09-08 MED ORDER — HYDROCODONE-ACETAMINOPHEN 5-325 MG PO TABS
1.0000 | ORAL_TABLET | Freq: Once | ORAL | Status: AC
  Administered 2017-09-08: 1 via ORAL
  Filled 2017-09-08: qty 1

## 2017-09-08 MED ORDER — MORPHINE SULFATE (PF) 4 MG/ML IV SOLN
4.0000 mg | Freq: Once | INTRAVENOUS | Status: AC
  Administered 2017-09-08: 4 mg via INTRAVENOUS
  Filled 2017-09-08: qty 1

## 2017-09-08 NOTE — ED Provider Notes (Signed)
[facility] DEPT Provider Note   CSN: [id number] Arrival date & time: 09/08/17  1834     History   Chief Complaint Chief Complaint  Patient presents with  . Knee Pain    R    HPI Lee Lee is a 66 y.o. male.  HPI Patient presents to the emergency room for evaluation of a right knee injury. Patient was on his porch and he slipped on some leaves. His right knee buckled and he fell down a few steps. Patient sustained some minor abrasions to his left knee and his right foot however his primary injury is his right knee. Patient states he felt like his knee twisted. He is unable to extend his leg or stand. He has tenderness around the right patella. He denies any head injury. No loss of consciousness. No hip injury or back pain. No numbness or weakness.  Tet 2 years ago Past Medical History:  Diagnosis Date  . Alcohol abuse, episodic drinking behavior   . Arthritis    back & knees  . Atrial flutter ([place])    s/p RFCA 01/05/13  . Calcium oxalate renal stones   . Complication of anesthesia    makes him loopy  . ED (erectile dysfunction)   . Hemorrhoids   . Hypertension     Patient Active Problem List   Diagnosis Date Noted  . Insomnia due to anxiety and fear 09/30/2016  . Restless leg syndrome, familial 09/30/2016  . Iron deficiency anemia 09/30/2016  . Hypertriglyceridemia 06/15/2016  . Hyperlipidemia with target LDL less than 130 06/15/2016  . Hyperglycemia 06/15/2016  . S/P radiofrequency ablation operation for arrhythmia 07/04/15 07/05/2015  . SVT (supraventricular tachycardia) ([place]) 07/04/2015  . Melanoma of skin ([place]) 04/16/2015  . Atrial flutter ([place]) 12/07/2012  . Essential hypertension 11/07/2012  . Ascending aortic aneurysm ([place]) 11/07/2012  . ED (erectile dysfunction) 11/15/2011    Past Surgical History:  Procedure Laterality Date  . ABLATION OF DYSRHYTHMIC FOCUS  01/05/2013  . ARTHROSCOPIC REPAIR ACL    . ATRIAL FLUTTER ABLATION N/A 01/05/2013   Procedure: ATRIAL FLUTTER ABLATION;  Surgeon: Evans Lance, MD;  Location: [facility] CATH LAB;  Service: Cardiovascular;  Laterality: N/A;  . COLONOSCOPY  11/2009   Dr. Benson Norway  . ELECTROPHYSIOLOGIC STUDY N/A 07/04/2015   Procedure: A-Flutter Ablation;  Surgeon: Evans Lance, MD;  Location: [facility] CV LAB;  Service: Cardiovascular;  Laterality: N/A;  . ROTATOR CUFF REPAIR         Home Medications    Prior to Admission medications   Medication Sig Start Date End Date Taking? Authorizing Provider  aspirin EC 81 MG tablet Take 1 tablet (81 mg total) by mouth daily. 11/25/16  Yes Larey Dresser, MD  Azilsartan-Chlorthalidone (EDARBYCLOR) 40-12.5 MG TABS Take 1 tablet by mouth daily. 06/22/17  Yes Janith Lima, MD  Fe Cbn-Fe Gluc-FA-B12-C-DSS (FERRALET 90) 90-1 MG TABS Take 1 tablet by mouth daily. 09/30/16  Yes Janith Lima, MD  L-Methylfolate-Algae (DEPLIN 15 PO) Take 1 tablet by mouth daily.    Yes [provider]  metoprolol succinate (TOPROL-XL) 25 MG 24 hr tablet TAKE ONE TABLET BY MOUTH TWICE A DAY TAKE WITH OR IMMEDIATELY FOLLOWING A MEAL 05/16/17  Yes Evans Lance, MD  traZODone (DESYREL) 150 MG tablet Take 0.5 tablet by mouth daily at bedtime as needed for sleep   Yes [provider]  vortioxetine HBr (TRINTELLIX) 10 MG TABS Take 10 mg by mouth daily.   Yes [provider]  sildenafil (REVATIO) 20 MG tablet TAKE ONE TO FIVE TABLETS  BY MOUTH DAILY AS NEEDED Patient taking differently: TAKE ONE TO FIVE TABLETS  BY MOUTH DAILY AS NEEDED FOR ED 06/14/17   Janith Lima, MD    Family History Family History  Problem Relation Age of Onset  . Valvular heart disease Mother   . Lymphoma Mother     Social History Social History  Substance Use Topics  . Smoking status: Former Smoker    Quit date: 01/06/1988  . Smokeless tobacco: Current User    Types: Chew     Comment: chews tobacco when playing golf only  . Alcohol use 3.0 oz/week    5 Glasses of  wine per week     Comment: wine daily     Allergies   Patient has no known allergies.   Review of Systems Review of Systems  All other systems reviewed and are negative.    Physical Exam Updated Vital Signs BP 120/77   Pulse (!) 58   Temp 97.7 F (36.5 C) (Oral)   Resp 16   SpO2 96%   Physical Exam  Constitutional: He appears well-developed and well-nourished. No distress.  HENT:  Head: Normocephalic and atraumatic.  Right Ear: External ear normal.  Left Ear: External ear normal.  Eyes: Conjunctivae are normal. Right eye exhibits no discharge. Left eye exhibits no discharge. No scleral icterus.  Neck: Neck supple. No tracheal deviation present.  Cardiovascular: Normal rate.   Pulmonary/Chest: Effort normal. No stridor. No respiratory distress.  Abdominal: He exhibits no distension.  Musculoskeletal: He exhibits no edema.       Right hip: Normal.       Left hip: Normal.       Right knee: He exhibits swelling and deformity. Tenderness found.       Left knee: Normal.       Right ankle: Normal.       Left ankle: Normal.       Cervical back: Normal.       Thoracic back: Normal.       Lumbar back: Normal.  Minor abrasions left patella, the patient is unable to hold his left leg off the bed with his lower leg extended, he is unable to extend his lower leg, tenderness palpation along the patella and the quadricept tendon, defect of the quadricept tendon vs patella  Neurological: He is alert. Cranial nerve deficit: no gross deficits.  Skin: Skin is warm and dry. No rash noted.  Psychiatric: He has a normal mood and affect.  Nursing note and vitals reviewed.    ED Treatments / Results    Radiology Dg Knee Complete 4 Views Right  Result Date: 09/08/2017 CLINICAL DATA:  Unable to extend leg, fell while blowing leaves in the yard, anterior RIGHT knee pain question patellar fracture versus quadriceps tendon rupture EXAM: RIGHT KNEE - COMPLETE 4+ VIEW COMPARISON:  None  FINDINGS: Osseous mineralization normal. Joint spaces preserved. Patellar spur formation at quadriceps tendon insertion. No acute fracture, dislocation or bone destruction. Disrupted soft tissue silhouette superior to the patella highly suspicious for a quadriceps tendon injury. IMPRESSION: No acute osseous abnormalities. Abnormal soft tissue silhouette superior to the patella highly suspicious for a quadriceps tendon injury; this can be better assessed by MR. Electronically Signed   By: Lavonia Dana M.D.   On: 09/08/2017 19:36    Procedures Procedures (including critical care time)  Medications Ordered in ED Medications  morphine 4 MG/ML  injection 4 mg (4 mg Intravenous Given 09/08/17 1929)     Initial Impression / Assessment and Plan / ED Course  I have reviewed the triage vital signs and the nursing notes.  Pertinent labs & imaging results that were available during my care of the patient were reviewed by me and considered in my medical decision making (see chart for details).   patient's x-rays are without signs of fracture or dislocation. Clinically his physical exam is suggestive of a quadriceps tendon injury. X-rays also suggests this possibility as well.I will place the patient in a knee immobilizer and crutches. He has seen Dr. Ninfa Linden in the past. I will have the patient call his office tomorrow to arrange for close follow-up.  Final Clinical Impressions(s) / ED Diagnoses   Final diagnoses:  Quadriceps tendon rupture, right, initial encounter    New Prescriptions Discharge Medication List as of 09/08/2017  8:14 PM    START taking these medications   Details  HYDROcodone-acetaminophen (NORCO/VICODIN) 5-325 MG tablet Take 1 tablet by mouth every 4 (four) hours as needed., Starting Thu 09/08/2017, Print         Dorie Rank, MD 09/09/17 1250

## 2017-09-08 NOTE — ED Notes (Signed)
Bed: BF38 Expected date:  Expected time:  Means of arrival:  Comments: EMS-fall-knee deformity

## 2017-09-08 NOTE — ED Triage Notes (Signed)
Pt BIB [facility] c/o R knee injury. He was on his front porch and he fell down 5 steps after his R knee buckled. He reports feeling like his knee rotated outward. Swelling noted. Splinted with EMS. Denies neck or back pain. No LOC. Abrasions to L knee and R foot. A&Ox4.

## 2017-09-08 NOTE — Discharge Instructions (Signed)
Make sure to use the knee immobilizer and crutches, follow-up with your orthopedic doctor, call to schedule an appointment

## 2017-09-08 NOTE — ED Notes (Signed)
Ortho tech called

## 2017-09-09 ENCOUNTER — Ambulatory Visit (INDEPENDENT_AMBULATORY_CARE_PROVIDER_SITE_OTHER): Payer: BLUE CROSS/BLUE SHIELD | Admitting: Orthopaedic Surgery

## 2017-09-09 ENCOUNTER — Encounter (INDEPENDENT_AMBULATORY_CARE_PROVIDER_SITE_OTHER): Payer: Self-pay | Admitting: Orthopaedic Surgery

## 2017-09-09 ENCOUNTER — Ambulatory Visit (INDEPENDENT_AMBULATORY_CARE_PROVIDER_SITE_OTHER): Payer: BLUE CROSS/BLUE SHIELD

## 2017-09-09 VITALS — BP 113/74 | HR 76 | Ht 71.0 in | Wt 225.0 lb

## 2017-09-09 DIAGNOSIS — S76111A Strain of right quadriceps muscle, fascia and tendon, initial encounter: Secondary | ICD-10-CM | POA: Diagnosis not present

## 2017-09-09 DIAGNOSIS — M25562 Pain in left knee: Secondary | ICD-10-CM

## 2017-09-09 MED ORDER — ROCURONIUM BROMIDE 50 MG/5ML IV SOSY
PREFILLED_SYRINGE | INTRAVENOUS | Status: AC
  Filled 2017-09-09: qty 20

## 2017-09-09 MED ORDER — LIDOCAINE HCL 1 % IJ SOLN
1.0000 mL | INTRAMUSCULAR | Status: AC | PRN
  Administered 2017-09-09: 1 mL

## 2017-09-09 MED ORDER — METHYLPREDNISOLONE ACETATE 40 MG/ML IJ SUSP
40.0000 mg | INTRAMUSCULAR | Status: AC | PRN
  Administered 2017-09-09: 40 mg via INTRA_ARTICULAR

## 2017-09-09 MED ORDER — BUPIVACAINE HCL 0.25 % IJ SOLN
4.0000 mL | INTRAMUSCULAR | Status: AC | PRN
  Administered 2017-09-09: 4 mL via INTRA_ARTICULAR

## 2017-09-09 NOTE — Progress Notes (Signed)
Office Visit Note   Patient: Lee Lee           Date of Birth: December 06, 1950           MRN: [id number] Visit Date: 09/09/2017              Requested by: Janith Lima, MD [address] [place], [place] [address] PCP: Janith Lima, MD   Assessment & Plan: Visit Diagnoses:  1. Acute pain of left knee   2. Traumatic rupture of right quadriceps tendon, initial encounter     Plan: Left knee aspirated for pain relief. Presently sleeve applied. Right knee has acute complete quadriceps rupture will schedule a quadriceps tendon repair on Monday as an outpatient procedure with overnight stay. Surgical procedure outlined plan discussed wrist surgery discussed understands and agrees to proceed. Patient's daughter is getting married one week from tomorrow we had an extensive discussion about modifications that he will be making.  Follow-Up Instructions: No Follow-up on file.   Orders:  Orders Placed This Encounter  Procedures  . Large Joint Injection/Arthrocentesis  . XR KNEE 3 VIEW LEFT   No orders of the defined types were placed in this encounter.     Procedures: Large Joint Inj Date/Time: 09/09/2017 3:43 PM Performed by: Marybelle Killings Authorized by: Marybelle Killings   Consent Given by:  Patient Site marked: the procedure site was marked   Indications:  Pain and joint swelling Location:  Knee Site:  L knee Needle Size:  22 G Needle Length:  1.5 inches Approach:  Anterolateral Ultrasound Guidance: No   Fluoroscopic Guidance: No   Arthrogram: No   Medications:  1 mL lidocaine 1 %; 40 mg methylPREDNISolone acetate 40 MG/ML; 4 mL bupivacaine 0.25 % Aspiration Attempted: Yes   Aspirate amount (mL):  40 Aspirate:  Bloody Patient tolerance:  Patient tolerated the procedure well with no immediate complications     Clinical Data: No additional findings.   Subjective: Chief Complaint  Patient presents with  . Left Knee - Pain  . Right Knee - Pain     HPI 66 year old male was on his front porch it was raining he was sleeping off the steps cleaning some debris slipped fell off the porch suffering an acute right quadriceps tendon rupture and bilateral knee abrasions. Right knee got caught behind him with hyperflexion. He was  placed in a knee immobilizer in the emergency room after x-rays showed evidence of a soft tissue sleep will proximally with patella baja.  Review of Systems vertebral review of systems positive for atrial flutter in the past SVT, radiofrequency ablation August 2016 with no problems since. history of hypertriglyceridemia, history of iron deficiency anemia.   Objective: Vital Signs: BP 113/74   Pulse 76   Ht 5' 11"  (1.803 m)   Wt 225 lb (102.1 kg)   BMI 31.38 kg/m   Physical Exam  Constitutional: He is oriented to person, place, and time. He appears well-developed and well-nourished.  HENT:  Head: Normocephalic and atraumatic.  Eyes: Pupils are equal, round, and reactive to light. EOM are normal.  Neck: No tracheal deviation present. No thyromegaly present.  Cardiovascular: Normal rate.   Pulmonary/Chest: Effort normal. He has no wheezes.  Abdominal: Soft. Bowel sounds are normal.  Neurological: He is alert and oriented to person, place, and time.  Skin: Skin is warm and dry. Capillary refill takes less than 2 seconds.  Psychiatric: He has a normal mood and affect. His behavior  is normal. Judgment and thought content normal.    Ortho Exam patient has intact sensation to his foot. Tenderness. There are abrasions superficial over the left knee. He does have the left knee effusion. Right knee shows palpable defect at the superior pole the patella with patella baja and the patella stays low even with quadriceps contraction and there is widening of the palpable defect in the quad tendon. Pulses are 2+. Good hip range of motion.  Specialty Comments:  No specialty comments available.  Imaging: Dg Knee Complete 4  Views Right  Result Date: 09/08/2017 CLINICAL DATA:  Unable to extend leg, fell while blowing leaves in the yard, anterior RIGHT knee pain question patellar fracture versus quadriceps tendon rupture EXAM: RIGHT KNEE - COMPLETE 4+ VIEW COMPARISON:  None FINDINGS: Osseous mineralization normal. Joint spaces preserved. Patellar spur formation at quadriceps tendon insertion. No acute fracture, dislocation or bone destruction. Disrupted soft tissue silhouette superior to the patella highly suspicious for a quadriceps tendon injury. IMPRESSION: No acute osseous abnormalities. Abnormal soft tissue silhouette superior to the patella highly suspicious for a quadriceps tendon injury; this can be better assessed by MR. Electronically Signed   By: Lavonia Dana M.D.   On: 09/08/2017 19:36   Xr Knee 3 View Left  Result Date: 09/09/2017 X-rays left knee demonstrates medial compartment arthritis bone-on-bone changes no acute fracture. Patellas normally located. There is some quadriceps calcification at the patellar tendon insertion site. Impression: Primarily medial compartment arthritis. Negative for acute fracture post fall.    PMFS History: Patient Active Problem List   Diagnosis Date Noted  . Insomnia due to anxiety and fear 09/30/2016  . Restless leg syndrome, familial 09/30/2016  . Iron deficiency anemia 09/30/2016  . Hypertriglyceridemia 06/15/2016  . Hyperlipidemia with target LDL less than 130 06/15/2016  . Hyperglycemia 06/15/2016  . S/P radiofrequency ablation operation for arrhythmia 07/04/15 07/05/2015  . SVT (supraventricular tachycardia) ([place]) 07/04/2015  . Melanoma of skin ([place]) 04/16/2015  . Atrial flutter ([place]) 12/07/2012  . Essential hypertension 11/07/2012  . Ascending aortic aneurysm ([place]) 11/07/2012  . ED (erectile dysfunction) 11/15/2011   Past Medical History:  Diagnosis Date  . Alcohol abuse, episodic drinking behavior   . Arthritis    back & knees  . Atrial flutter ([place])     s/p RFCA 01/05/13  . Calcium oxalate renal stones   . Complication of anesthesia    makes him loopy  . ED (erectile dysfunction)   . Hemorrhoids   . Hypertension     Family History  Problem Relation Age of Onset  . Valvular heart disease Mother   . Lymphoma Mother     Past Surgical History:  Procedure Laterality Date  . ABLATION OF DYSRHYTHMIC FOCUS  01/05/2013  . ARTHROSCOPIC REPAIR ACL    . ATRIAL FLUTTER ABLATION N/A 01/05/2013   Procedure: ATRIAL FLUTTER ABLATION;  Surgeon: Evans Lance, MD;  Location: [facility] CATH LAB;  Service: Cardiovascular;  Laterality: N/A;  . COLONOSCOPY  11/2009   Dr. Benson Norway  . ELECTROPHYSIOLOGIC STUDY N/A 07/04/2015   Procedure: A-Flutter Ablation;  Surgeon: Evans Lance, MD;  Location: [place] [facility] CV LAB;  Service: Cardiovascular;  Laterality: N/A;  . ROTATOR CUFF REPAIR     Social History   Occupational History  . Not on file.   Social History Main Topics  . Smoking status: Former Smoker    Quit date: 01/06/1988  . Smokeless tobacco: Current User    Types: Chew     Comment:  chews tobacco when playing golf only  . Alcohol use 3.0 oz/week    5 Glasses of wine per week     Comment: wine daily  . Drug use: No  . Sexual activity: Yes

## 2017-09-12 ENCOUNTER — Ambulatory Visit (INDEPENDENT_AMBULATORY_CARE_PROVIDER_SITE_OTHER): Payer: BLUE CROSS/BLUE SHIELD | Admitting: Orthopaedic Surgery

## 2017-09-12 DIAGNOSIS — S76111A Strain of right quadriceps muscle, fascia and tendon, initial encounter: Secondary | ICD-10-CM | POA: Diagnosis not present

## 2017-09-16 ENCOUNTER — Ambulatory Visit (INDEPENDENT_AMBULATORY_CARE_PROVIDER_SITE_OTHER): Payer: BLUE CROSS/BLUE SHIELD | Admitting: Orthopaedic Surgery

## 2017-09-16 ENCOUNTER — Encounter (INDEPENDENT_AMBULATORY_CARE_PROVIDER_SITE_OTHER): Payer: Self-pay | Admitting: Orthopaedic Surgery

## 2017-09-16 VITALS — BP 105/67 | HR 68 | Ht 71.0 in | Wt 225.0 lb

## 2017-09-16 DIAGNOSIS — S76111D Strain of right quadriceps muscle, fascia and tendon, subsequent encounter: Secondary | ICD-10-CM | POA: Diagnosis not present

## 2017-09-16 MED ORDER — HYDROCODONE-ACETAMINOPHEN 5-325 MG PO TABS: 1.0000 | ORAL_TABLET | Freq: Four times a day (QID) | ORAL | 0 refills | Status: DC | PRN

## 2017-09-16 NOTE — Addendum Note (Signed)
Addended by: Meyer Cory on: 09/16/2017 02:56 PM   Modules accepted: Orders

## 2017-09-16 NOTE — Progress Notes (Signed)
   Post-Op Visit Note   Patient: Lee Lee           Date of Birth: [birth year]           MRN: [id number] Visit Date: 09/16/2017 PCP: Janith Lima, MD   Assessment & Plan: Postop quad tendon repair, right knee. Incision was good. Prescription for Norco given. Will start up some physical therapy at [facility] outpatient PT. Return 4 weeks. We discussed passive knee flexion using his hands isometric quads and progressing to knee flexion in a sitting position gradually.  Chief Complaint:  Chief Complaint  Patient presents with  . Right Leg - Routine Post Op  . Left Knee - Follow-up   Visit Diagnoses:  1. Rupture of right quadriceps tendon, subsequent encounter     Plan: The knee immobilizer applied return 4 weeks. Signed Rodell Perna M.D. thanks  Follow-Up Instructions: Return in about 4 weeks (around 10/14/2017).   Orders:  No orders of the defined types were placed in this encounter.  No orders of the defined types were placed in this encounter.   Imaging: No results found.  PMFS History: Patient Active Problem List   Diagnosis Date Noted  . Insomnia due to anxiety and fear 09/30/2016  . Restless leg syndrome, familial 09/30/2016  . Iron deficiency anemia 09/30/2016  . Hypertriglyceridemia 06/15/2016  . Hyperlipidemia with target LDL less than 130 06/15/2016  . Hyperglycemia 06/15/2016  . S/P radiofrequency ablation operation for arrhythmia 07/04/15 07/05/2015  . SVT (supraventricular tachycardia) ([place]) 07/04/2015  . Melanoma of skin ([place]) 04/16/2015  . Atrial flutter ([place]) 12/07/2012  . Essential hypertension 11/07/2012  . Ascending aortic aneurysm ([place]) 11/07/2012  . ED (erectile dysfunction) 11/15/2011   Past Medical History:  Diagnosis Date  . Alcohol abuse, episodic drinking behavior   . Arthritis    back & knees  . Atrial flutter ([place])    s/p RFCA 01/05/13  . Calcium oxalate renal stones   . Complication of anesthesia    makes him loopy  . ED  (erectile dysfunction)   . Hemorrhoids   . Hypertension     Family History  Problem Relation Age of Onset  . Valvular heart disease Mother   . Lymphoma Mother     Past Surgical History:  Procedure Laterality Date  . ABLATION OF DYSRHYTHMIC FOCUS  01/05/2013  . ARTHROSCOPIC REPAIR ACL    . ATRIAL FLUTTER ABLATION N/A 01/05/2013   Procedure: ATRIAL FLUTTER ABLATION;  Surgeon: Evans Lance, MD;  Location: [facility] CATH LAB;  Service: Cardiovascular;  Laterality: N/A;  . COLONOSCOPY  11/2009   Dr. Benson Norway  . ELECTROPHYSIOLOGIC STUDY N/A 07/04/2015   Procedure: A-Flutter Ablation;  Surgeon: Evans Lance, MD;  Location: [facility] CV LAB;  Service: Cardiovascular;  Laterality: N/A;  . ROTATOR CUFF REPAIR     Social History   Occupational History  . Not on file.   Social History Main Topics  . Smoking status: Former Smoker    Quit date: 01/06/1988  . Smokeless tobacco: Current User    Types: Chew     Comment: chews tobacco when playing golf only  . Alcohol use 3.0 oz/week    5 Glasses of wine per week     Comment: wine daily  . Drug use: No  . Sexual activity: Yes

## 2017-09-21 ENCOUNTER — Ambulatory Visit: Payer: BLUE CROSS/BLUE SHIELD | Attending: Orthopaedic Surgery

## 2017-09-21 DIAGNOSIS — R600 Localized edema: Secondary | ICD-10-CM

## 2017-09-21 DIAGNOSIS — R2689 Other abnormalities of gait and mobility: Secondary | ICD-10-CM

## 2017-09-21 DIAGNOSIS — M25661 Stiffness of right knee, not elsewhere classified: Secondary | ICD-10-CM | POA: Diagnosis present

## 2017-09-21 DIAGNOSIS — M6281 Muscle weakness (generalized): Secondary | ICD-10-CM | POA: Diagnosis present

## 2017-09-21 DIAGNOSIS — M25561 Pain in right knee: Secondary | ICD-10-CM
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
## 2017-09-21 NOTE — Patient Instructions (Addendum)
Quad Set    With other leg bent, foot flat, slowly tighten muscles on thigh of straight leg while counting out loud to _5___. Repeat _10-20___ times. Do __many__ sessions per day.  http://gt2.exer.us/275   Copyright  VHI. All rights reserved.  Chair Knee Flexion   Keeping feet on floor, slide foot of operated leg back, bending knee. Hold _1-___ seconds. Repeat _5-10___ times. Do __3-5__ sessions a day.    Knee: Flexion    Lie on back with belt or sheet looped around right ankle. Gently pull to bend knee, sliding heel. Hold _10__ seconds. Repeat _10_ times. Do _3-5___ sessions per day.   Copyright  VHI. All rights reserved.  ANKLE: Pumps     [facility] [address] [place], [place] [address] Phone # [phone] Fax [phone]

## 2017-09-23 ENCOUNTER — Ambulatory Visit: Payer: BLUE CROSS/BLUE SHIELD | Admitting: Physical Therapy

## 2017-09-23 ENCOUNTER — Encounter: Payer: Self-pay | Admitting: Physical Therapy

## 2017-09-23 DIAGNOSIS — M25561 Pain in right knee: Secondary | ICD-10-CM | POA: Diagnosis not present

## 2017-09-23 DIAGNOSIS — R2689 Other abnormalities of gait and mobility: Secondary | ICD-10-CM

## 2017-09-23 DIAGNOSIS — R600 Localized edema: Secondary | ICD-10-CM

## 2017-09-23 DIAGNOSIS — M25661 Stiffness of right knee, not elsewhere classified: Secondary | ICD-10-CM

## 2017-09-23 DIAGNOSIS — M6281 Muscle weakness (generalized): Secondary | ICD-10-CM

## 2017-09-23 NOTE — Therapy (Signed)
[facility] [address], [place], [place], [address] Phone: [phone]   Fax:  [phone]  Physical Therapy Treatment  Patient Details  Name: Lee Lee MRN: [id number] Date of Birth: 1951-05-19 Referring Provider: Rodell Perna, MD  Encounter Date: 09/23/2017      PT End of Session - 09/23/17 0942    Visit Number 2   Date for PT Re-Evaluation 11/16/17   PT Start Time 0934   PT Stop Time 1030   PT Time Calculation (min) 56 min   Equipment Utilized During Treatment Right knee immobilizer   Activity Tolerance Patient tolerated treatment well   Behavior During Therapy [facility] for tasks assessed/performed      Past Medical History:  Diagnosis Date  . Alcohol abuse, episodic drinking behavior   . Arthritis    back & knees  . Atrial flutter ([place])    s/p RFCA 01/05/13  . Calcium oxalate renal stones   . Complication of anesthesia    makes him loopy  . ED (erectile dysfunction)   . Hemorrhoids   . Hypertension     Past Surgical History:  Procedure Laterality Date  . ABLATION OF DYSRHYTHMIC FOCUS  01/05/2013  . ARTHROSCOPIC REPAIR ACL    . ATRIAL FLUTTER ABLATION N/A 01/05/2013   Procedure: ATRIAL FLUTTER ABLATION;  Surgeon: Evans Lance, MD;  Location: [facility] CATH LAB;  Service: Cardiovascular;  Laterality: N/A;  . COLONOSCOPY  11/2009   Dr. Benson Norway  . ELECTROPHYSIOLOGIC STUDY N/A 07/04/2015   Procedure: A-Flutter Ablation;  Surgeon: Evans Lance, MD;  Location: [place] CV LAB;  Service: Cardiovascular;  Laterality: N/A;  . ROTATOR CUFF REPAIR      There were no vitals filed for this visit.      Subjective Assessment - 09/23/17 0944    Subjective I'm ok. Not much pain.    Currently in Pain? Yes   Pain Score 2    Pain Location Knee   Pain Orientation Right   Pain Descriptors / Indicators Dull            [facility] PT Assessment - 09/23/17 0001      PROM   Right Knee Flexion 75                      [facility] Adult PT Treatment/Exercise - 09/23/17 0001      Moist Heat Therapy   Number Minutes Moist Heat 10 Minutes  pre tx for hamstring and quad sof ttissue prep     Vasopneumatic   Number Minutes Vasopneumatic  15 minutes   Vasopnuematic Location  Knee   Vasopneumatic Pressure Medium   Vasopneumatic Temperature  3 snowflakes     Manual Therapy   Manual Therapy Joint mobilization;Passive ROM   Manual therapy comments Knee flexion   Joint Mobilization Patella mobs, scar mobs, quad and hams softiissue work,                   PT Short Term Goals - 09/23/17 1018      PT SHORT TERM GOAL #1   Title be independent in initial HEP   Time 4   Period Weeks   Status Achieved     PT SHORT TERM GOAL #2   Title demonstrate Rt knee P/ROM flexion to > or = to 65 degrees to improve mobility   Time 4   Period Weeks   Status Achieved  75 degrees  PT Long Term Goals - 09/21/17 1324      PT LONG TERM GOAL #1   Title be independent in advanced HEP   Time 8   Period Weeks   Status New   Target Date 11/16/17     PT LONG TERM GOAL #2   Title reduce FOTO to < or = to 35% limitation   Time 8   Period Weeks   Status New   Target Date 11/16/17     PT LONG TERM GOAL #3   Title demonstrate Rt knee P/ROM flexion to > or = to 95 degrees to allow for sitting without substitution   Time 8   Period Weeks   Status New   Target Date 11/16/17     PT LONG TERM GOAL #4   Title demonstrate 3+/5 Rt quad strength to improve stabilization with gait   Time 8   Period Weeks   Status New   Target Date 11/16/17     PT LONG TERM GOAL #5   Title report < or = to 2/10 Rt LE strength with standing and walking   Time 8   Period Weeks   Status New   Target Date 11/16/17               Plan - 09/23/17 0943    Clinical Impression Statement Pt gained 75 degrees of passive knee flexion at end of sesison. Tissue tighteness throughout which is  typical being immobilized. pt contihues to work on improving his quad set at home, [place] [employer] will be continued to work on reducing his edema.    Rehab Potential Good   PT Frequency 2x / week   PT Duration 8 weeks   PT Treatment/Interventions ADLs/Self Care Home Management;Cryotherapy;Electrical Stimulation;Functional mobility training;Stair training;Moist Heat;Therapeutic activities;Therapeutic exercise;Neuromuscular re-education;Passive range of motion;Scar mobilization;Manual techniques;Taping;Vasopneumatic Device   PT Next Visit Plan quad sets, PROM into flexion, patellar mobs, scar mobs, game ready for edema.  Sidelying hip abduction and prone extension with brace on.   Consulted and Agree with Plan of Care Patient      Patient will benefit from skilled therapeutic intervention in order to improve the following deficits and impairments:  Abnormal gait, Difficulty walking, Pain, Decreased endurance, Decreased activity tolerance, Decreased range of motion, Increased edema, Decreased mobility  Visit Diagnosis: Acute pain of right knee  Stiffness of right knee, not elsewhere classified  Other abnormalities of gait and mobility  Localized edema  Muscle weakness (generalized)     Problem List Patient Active Problem List   Diagnosis Date Noted  . Insomnia due to anxiety and fear 09/30/2016  . Restless leg syndrome, familial 09/30/2016  . Iron deficiency anemia 09/30/2016  . Hypertriglyceridemia 06/15/2016  . Hyperlipidemia with target LDL less than 130 06/15/2016  . Hyperglycemia 06/15/2016  . S/P radiofrequency ablation operation for arrhythmia 07/04/15 07/05/2015  . SVT (supraventricular tachycardia) ([place]) 07/04/2015  . Melanoma of skin ([place]) 04/16/2015  . Atrial flutter ([place]) 12/07/2012  . Essential hypertension 11/07/2012  . Ascending aortic aneurysm ([place]) 11/07/2012  . ED (erectile dysfunction) 11/15/2011    Glendola Friedhoff, PTA 09/23/2017, 10:19 AM  [facility] [address] [place], [place], [address] Phone: [phone]   Fax:  [phone]  Name: Lee Lee MRN: [id number] Date of Birth: 1951-07-09

## 2017-09-26 ENCOUNTER — Ambulatory Visit: Payer: BLUE CROSS/BLUE SHIELD | Admitting: Physical Therapy

## 2017-09-26 ENCOUNTER — Encounter: Payer: Self-pay | Admitting: Physical Therapy

## 2017-09-26 DIAGNOSIS — R2689 Other abnormalities of gait and mobility: Secondary | ICD-10-CM

## 2017-09-26 DIAGNOSIS — M25561 Pain in right knee: Secondary | ICD-10-CM

## 2017-09-26 DIAGNOSIS — M6281 Muscle weakness (generalized): Secondary | ICD-10-CM

## 2017-09-26 DIAGNOSIS — M25661 Stiffness of right knee, not elsewhere classified: Secondary | ICD-10-CM

## 2017-09-26 DIAGNOSIS — R600 Localized edema: Secondary | ICD-10-CM

## 2017-09-26 NOTE — Therapy (Signed)
[facility] [address], [place], [place], [address] Phone: [phone]   Fax:  [phone]  Physical Therapy Treatment  Patient Details  Name: Lee Lee MRN: [id number] Date of Birth: [birth year] Referring Provider: Rodell Perna, MD  Encounter Date: 09/26/2017      PT End of Session - 09/26/17 0928    Visit Number 3   Date for PT Re-Evaluation 11/16/17   PT Start Time 0843   PT Stop Time 0945   PT Time Calculation (min) 62 min   Equipment Utilized During Treatment Right knee immobilizer   Activity Tolerance Patient tolerated treatment well   Behavior During Therapy [facility] for tasks assessed/performed      Past Medical History:  Diagnosis Date  . Alcohol abuse, episodic drinking behavior   . Arthritis    back & knees  . Atrial flutter ([place])    s/p RFCA 01/05/13  . Calcium oxalate renal stones   . Complication of anesthesia    makes him loopy  . ED (erectile dysfunction)   . Hemorrhoids   . Hypertension     Past Surgical History:  Procedure Laterality Date  . ABLATION OF DYSRHYTHMIC FOCUS  01/05/2013  . ARTHROSCOPIC REPAIR ACL    . ATRIAL FLUTTER ABLATION N/A 01/05/2013   Procedure: ATRIAL FLUTTER ABLATION;  Surgeon: Evans Lance, MD;  Location: [facility] CATH LAB;  Service: Cardiovascular;  Laterality: N/A;  . COLONOSCOPY  11/2009   Dr. Benson Norway  . ELECTROPHYSIOLOGIC STUDY N/A 07/04/2015   Procedure: A-Flutter Ablation;  Surgeon: Evans Lance, MD;  Location: [place] CV LAB;  Service: Cardiovascular;  Laterality: N/A;  . ROTATOR CUFF REPAIR      There were no vitals filed for this visit.      Subjective Assessment - 09/26/17 0848    Subjective No pain, just annoyed by the immobilizer.    Pertinent History Rt quad tendon repair: 09/12/17.  Quad isometrics and knee PROM per MD   Limitations Standing;Walking   Currently in Pain? No/denies   Multiple Pain Sites No            [facility] PT Assessment -  09/26/17 0001      PROM   Right Knee Flexion 78                     [facility] Adult PT Treatment/Exercise - 09/26/17 0001      Moist Heat Therapy   Number Minutes Moist Heat 10 Minutes  pre session     Vasopneumatic   Number Minutes Vasopneumatic  15 minutes   Vasopnuematic Location  Knee   Vasopneumatic Pressure Medium   Vasopneumatic Temperature  3 snowflakes     Manual Therapy   Manual Therapy Joint mobilization;Passive ROM   Manual therapy comments Knee flexion, Passive/AA   Joint Mobilization Patella mobs, scar mobs, quad and hams softiissue work,                   PT Short Term Goals - 09/23/17 1018      PT SHORT TERM GOAL #1   Title be independent in initial HEP   Time 4   Period Weeks   Status Achieved     PT SHORT TERM GOAL #2   Title demonstrate Rt knee P/ROM flexion to > or = to 65 degrees to improve mobility   Time 4   Period Weeks   Status Achieved  75 degrees  PT Long Term Goals - 09/21/17 1324      PT LONG TERM GOAL #1   Title be independent in advanced HEP   Time 8   Period Weeks   Status New   Target Date 11/16/17     PT LONG TERM GOAL #2   Title reduce FOTO to < or = to 35% limitation   Time 8   Period Weeks   Status New   Target Date 11/16/17     PT LONG TERM GOAL #3   Title demonstrate Rt knee P/ROM flexion to > or = to 95 degrees to allow for sitting without substitution   Time 8   Period Weeks   Status New   Target Date 11/16/17     PT LONG TERM GOAL #4   Title demonstrate 3+/5 Rt quad strength to improve stabilization with gait   Time 8   Period Weeks   Status New   Target Date 11/16/17     PT LONG TERM GOAL #5   Title report < or = to 2/10 Rt LE strength with standing and walking   Time 8   Period Weeks   Status New   Target Date 11/16/17               Plan - 09/26/17 0849    PT Next Visit Plan quad sets, PROM into flexion, patellar mobs, scar mobs, game ready for edema.   Sidelying hip abduction and prone extension with brace on.   Consulted and Agree with Plan of Care Patient      Patient will benefit from skilled therapeutic intervention in order to improve the following deficits and impairments:  Abnormal gait, Difficulty walking, Pain, Decreased endurance, Decreased activity tolerance, Decreased range of motion, Increased edema, Decreased mobility  Visit Diagnosis: Acute pain of right knee  Stiffness of right knee, not elsewhere classified  Other abnormalities of gait and mobility  Localized edema  Muscle weakness (generalized)     Problem List Patient Active Problem List   Diagnosis Date Noted  . Insomnia due to anxiety and fear 09/30/2016  . Restless leg syndrome, familial 09/30/2016  . Iron deficiency anemia 09/30/2016  . Hypertriglyceridemia 06/15/2016  . Hyperlipidemia with target LDL less than 130 06/15/2016  . Hyperglycemia 06/15/2016  . S/P radiofrequency ablation operation for arrhythmia 07/04/15 07/05/2015  . SVT (supraventricular tachycardia) ([place]) 07/04/2015  . Melanoma of skin ([place]) 04/16/2015  . Atrial flutter ([place]) 12/07/2012  . Essential hypertension 11/07/2012  . Ascending aortic aneurysm ([place]) 11/07/2012  . ED (erectile dysfunction) 11/15/2011    COCHRAN,JENNIFER, PTA 09/26/2017, 9:32 AM  [facility] [address], [place], [place], [address] Phone: [phone]   Fax:  [phone]  Name: Lee Lee MRN: [id number] Date of Birth: 1951-09-11

## 2017-09-27 ENCOUNTER — Encounter: Payer: Self-pay | Admitting: Physical Therapy

## 2017-09-27 ENCOUNTER — Ambulatory Visit: Payer: BLUE CROSS/BLUE SHIELD | Admitting: Physical Therapy

## 2017-09-27 DIAGNOSIS — M25561 Pain in right knee: Secondary | ICD-10-CM | POA: Diagnosis not present

## 2017-09-27 DIAGNOSIS — M25661 Stiffness of right knee, not elsewhere classified: Secondary | ICD-10-CM

## 2017-09-27 DIAGNOSIS — R2689 Other abnormalities of gait and mobility: Secondary | ICD-10-CM

## 2017-09-27 DIAGNOSIS — M6281 Muscle weakness (generalized): Secondary | ICD-10-CM

## 2017-09-27 DIAGNOSIS — R600 Localized edema: Secondary | ICD-10-CM

## 2017-09-27 NOTE — Patient Instructions (Addendum)
Calf Stretch    With towel around forefoot, keep knee straight and pull back on towel until a stretch is felt in the calf. Do with knee immobilizer on  Hold __30__ seconds. Repeat _2___ times. Do _2___ sessions per day.  Copyright  VHI. All rights reserved.   Chair Sitting    Sit at edge of seat, spine straight, one leg extended. Put a hand on each thigh and bend forward from the hip, keeping spine straight. Allow hand on extended leg to reach toward toes. Support upper body with other arm. Hold _30__ seconds. Do with knee immobilizer on Repeat _2__ times per session. Do _2__ sessions per day.  Copyright  VHI. All rights reserved.  [facility] [address] [place], [place] [address] Phone # [phone] Fax [phone]

## 2017-09-27 NOTE — Therapy (Signed)
[facility] [address], [place], [place], [address] Phone: [phone]   Fax:  [phone]  Physical Therapy Treatment  Patient Details  Name: Lee Lee MRN: [id number] Date of Birth: March 19, 1951 Referring Provider: Rodell Perna, MD  Encounter Date: 09/27/2017      PT End of Session - 09/27/17 1236    Visit Number 4   Date for PT Re-Evaluation 11/16/17   PT Start Time 2481   PT Stop Time 1245   PT Time Calculation (min) 60 min   Equipment Utilized During Treatment Right knee immobilizer   Activity Tolerance Patient tolerated treatment well   Behavior During Therapy [facility] for tasks assessed/performed      Past Medical History:  Diagnosis Date  . Alcohol abuse, episodic drinking behavior   . Arthritis    back & knees  . Atrial flutter ([place])    s/p RFCA 01/05/13  . Calcium oxalate renal stones   . Complication of anesthesia    makes him loopy  . ED (erectile dysfunction)   . Hemorrhoids   . Hypertension     Past Surgical History:  Procedure Laterality Date  . ABLATION OF DYSRHYTHMIC FOCUS  01/05/2013  . ARTHROSCOPIC REPAIR ACL    . ATRIAL FLUTTER ABLATION N/A 01/05/2013   Procedure: ATRIAL FLUTTER ABLATION;  Surgeon: Evans Lance, MD;  Location: [facility] CATH LAB;  Service: Cardiovascular;  Laterality: N/A;  . COLONOSCOPY  11/2009   Dr. Benson Norway  . ELECTROPHYSIOLOGIC STUDY N/A 07/04/2015   Procedure: A-Flutter Ablation;  Surgeon: Evans Lance, MD;  Location: [place] CV LAB;  Service: Cardiovascular;  Laterality: N/A;  . ROTATOR CUFF REPAIR      There were no vitals filed for this visit.      Subjective Assessment - 09/27/17 1145    Subjective After therapy I have some pain.    Pertinent History Rt quad tendon repair: 09/12/17.  Quad isometrics and knee PROM per MD   Limitations Standing;Walking   How long can you stand comfortably? 30 minutes   How long can you walk comfortably? 30 minutes   Patient Stated Goals increase Rt knee A/ROM, improve strength, return to walking without limitation   Currently in Pain? No/denies                         [facility] Adult PT Treatment/Exercise - 09/27/17 0001      Knee/Hip Exercises: Supine   Quad Sets Right;2 sets;10 reps     Knee/Hip Exercises: Sidelying   Hip ADduction AAROM;Strengthening;Right;2 sets;10 reps   Hip ADduction Limitations with knee immobilizer on     Knee/Hip Exercises: Prone   Hip Extension AAROM;Strengthening;Right;2 sets;10 reps   Hip Extension Limitations knee immobilizer on     Modalities   Modalities Vasopneumatic;Moist Heat     Moist Heat Therapy   Number Minutes Moist Heat 10 Minutes  pre tx for hamstring and quad soft tissue work prep     Vasopneumatic   Number Minutes Vasopneumatic  15 minutes   Vasopnuematic Location  Knee   Vasopneumatic Pressure Medium   Vasopneumatic Temperature  3 snowflakes     Manual Therapy   Manual Therapy Joint mobilization;Passive ROM;Soft tissue mobilization   Manual therapy comments Knee flexion, Passive/AA with foot on slider   Joint Mobilization Patella mobs, scar mobs, quad and hams softiissue work,    Soft tissue mobilization right quad, scar massage  PT Education - 09/27/17 1235    Education provided Yes   Education Details hamstring and gastroc stretch with knee immobilizer on   Person(s) Educated Patient   Methods Explanation;Demonstration;Verbal cues;Handout   Comprehension Returned demonstration;Verbalized understanding          PT Short Term Goals - 09/23/17 1018      PT SHORT TERM GOAL #1   Title be independent in initial HEP   Time 4   Period Weeks   Status Achieved     PT SHORT TERM GOAL #2   Title demonstrate Rt knee P/ROM flexion to > or = to 65 degrees to improve mobility   Time 4   Period Weeks   Status Achieved  75 degrees           PT Long Term Goals - 09/27/17 1240      PT LONG TERM  GOAL #1   Title be independent in advanced HEP   Time 8   Period Weeks   Status On-going     PT LONG TERM GOAL #3   Title demonstrate Rt knee P/ROM flexion to > or = to 95 degrees to allow for sitting without substitution   Time 8   Period Weeks   Status On-going     PT LONG TERM GOAL #4   Title demonstrate 3+/5 Rt quad strength to improve stabilization with gait   Time 8   Period Weeks   Status On-going     PT LONG TERM GOAL #5   Title report < or = to 2/10 Rt LE strength with standing and walking   Time 8   Period Weeks   Status On-going               Plan - 09/27/17 1236    Clinical Impression Statement Patient has decreased mobility of healed scar.  Patient has tightness in right gastroc and hamstring.  Patient is able to perform a quad set correctly.  Patient does better with right knee flexion PROM with slider under feet.  Patient will benefit from skilled therapy to right knee to progress  according to doctors guidlines.    Rehab Potential Good   Clinical Impairments Affecting Rehab Potential Right quad tendon repair 09/12/2017; stay in knee immobilizer; PROM and quad isometrics   PT Frequency 2x / week   PT Duration 8 weeks   PT Treatment/Interventions ADLs/Self Care Home Management;Cryotherapy;Electrical Stimulation;Functional mobility training;Stair training;Moist Heat;Therapeutic activities;Therapeutic exercise;Neuromuscular re-education;Passive range of motion;Scar mobilization;Manual techniques;Taping;Vasopneumatic Device   PT Next Visit Plan quad sets, PROM into flexion, patellar mobs, scar mobs, game ready for edema.  Sidelying hip abduction and prone extension with brace on.   PT Home Exercise Plan progress as needed   Recommended Other Services MD signed initial eval   Consulted and Agree with Plan of Care Patient      Patient will benefit from skilled therapeutic intervention in order to improve the following deficits and impairments:  Abnormal gait,  Difficulty walking, Pain, Decreased endurance, Decreased activity tolerance, Decreased range of motion, Increased edema, Decreased mobility  Visit Diagnosis: Acute pain of right knee  Stiffness of right knee, not elsewhere classified  Other abnormalities of gait and mobility  Localized edema  Muscle weakness (generalized)     Problem List Patient Active Problem List   Diagnosis Date Noted  . Insomnia due to anxiety and fear 09/30/2016  . Restless leg syndrome, familial 09/30/2016  . Iron deficiency anemia 09/30/2016  . Hypertriglyceridemia 06/15/2016  .  Hyperlipidemia with target LDL less than 130 06/15/2016  . Hyperglycemia 06/15/2016  . S/P radiofrequency ablation operation for arrhythmia 07/04/15 07/05/2015  . SVT (supraventricular tachycardia) ([place]) 07/04/2015  . Melanoma of skin ([place]) 04/16/2015  . Atrial flutter ([place]) 12/07/2012  . Essential hypertension 11/07/2012  . Ascending aortic aneurysm ([place]) 11/07/2012  . ED (erectile dysfunction) 11/15/2011    Earlie Counts, PT 09/27/17 12:43 PM   [facility] [address] [place], [place], [address] Phone: [phone]   Fax:  [phone]  Name: Maxie Slovacek MRN: [id number] Date of Birth: July 01, 1951

## 2017-10-03 ENCOUNTER — Ambulatory Visit: Payer: BLUE CROSS/BLUE SHIELD | Attending: Orthopaedic Surgery | Admitting: Physical Therapy

## 2017-10-03 ENCOUNTER — Encounter: Payer: Self-pay | Admitting: Physical Therapy

## 2017-10-03 DIAGNOSIS — M25561 Pain in right knee: Secondary | ICD-10-CM | POA: Diagnosis present

## 2017-10-03 DIAGNOSIS — R2689 Other abnormalities of gait and mobility: Secondary | ICD-10-CM | POA: Diagnosis present

## 2017-10-03 DIAGNOSIS — R600 Localized edema: Secondary | ICD-10-CM | POA: Insufficient documentation

## 2017-10-03 DIAGNOSIS — M25661 Stiffness of right knee, not elsewhere classified: Secondary | ICD-10-CM

## 2017-10-03 DIAGNOSIS — M6281 Muscle weakness (generalized): Secondary | ICD-10-CM | POA: Insufficient documentation

## 2017-10-03 NOTE — Therapy (Signed)
[facility] [address] [place], [place], [address] Phone: [phone]   Fax:  [phone]  Physical Therapy Treatment  Patient Details  Name: Lee Lee MRN: [id number] Date of Birth: 1951/04/07 Referring Provider: Rodell Perna, MD   Encounter Date: 10/03/2017  PT End of Session - 10/03/17 0839    Visit Number  5    Date for PT Re-Evaluation  11/16/17    PT Start Time  0835    PT Stop Time  0914    PT Time Calculation (min)  39 min    Activity Tolerance  Patient tolerated treatment well    Behavior During Therapy  [facility] for tasks assessed/performed       Past Medical History:  Diagnosis Date  . Alcohol abuse, episodic drinking behavior   . Arthritis    back & knees  . Atrial flutter ([place])    s/p RFCA 01/05/13  . Calcium oxalate renal stones   . Complication of anesthesia    makes him loopy  . ED (erectile dysfunction)   . Hemorrhoids   . Hypertension     Past Surgical History:  Procedure Laterality Date  . ABLATION OF DYSRHYTHMIC FOCUS  01/05/2013  . ARTHROSCOPIC REPAIR ACL    . COLONOSCOPY  11/2009   Dr. Benson Norway  . ROTATOR CUFF REPAIR      There were no vitals filed for this visit.  Subjective Assessment - 10/03/17 0840    Subjective  I went to [place] over the weekend. I did a little exercise in the water.     Pertinent History  Rt quad tendon repair: 09/12/17.  Quad isometrics and knee PROM per MD    Currently in Pain?  No/denies    Multiple Pain Sites  No         [facility] PT Assessment - 10/03/17 0001      PROM   Right Knee Flexion  100                  [facility] Adult PT Treatment/Exercise - 10/03/17 0001      Moist Heat Therapy   Number Minutes Moist Heat  10 Minutes pre tx to quad and hamstring while PTA began patella mobs   pre tx to quad and hamstring while PTA began patella mobs     Vasopneumatic   Number Minutes Vasopneumatic   15 minutes    Vasopnuematic Location    Knee    Vasopneumatic Pressure  Medium    Vasopneumatic Temperature   3 snowflakes      Manual Therapy   Manual Therapy  Joint mobilization;Passive ROM;Soft tissue mobilization    Manual therapy comments  Knee flexion, Passive/AA with foot on slider    Joint Mobilization  Patella mobs, scar mobs, quad and hams softiissue work,     Soft tissue mobilization  right quad, scar massage               PT Short Term Goals - 10/03/17 0914      PT SHORT TERM GOAL #3   Title  demonstrate good quad set on the Rt to improve Rt LE stability with gait    Time  4    Period  Weeks    Status  Achieved        PT Long Term Goals - 10/03/17 5945      PT LONG TERM GOAL #3   Title  demonstrate Rt knee P/ROM flexion to > or = to  95 degrees to allow for sitting without substitution    Time  8    Period  Weeks    Status  Achieved      PT LONG TERM GOAL #4   Title  demonstrate 3+/5 Rt quad strength to improve stabilization with gait    Time  8    Period  Weeks    Status  On-going            Plan - 10/03/17 0839    Clinical Impression Statement  Pt remains in his immoblizer for the majority of the time, taking it off when lounging/resting. Today he achieved 100 degrees of passive knee flexion with ease. Despite tightness around the patella it shows improvement weekly. All short terms goals met this week.     Rehab Potential  Good    Clinical Impairments Affecting Rehab Potential  Right quad tendon repair 09/12/2017; stay in knee immobilizer; PROM and quad isometrics    PT Frequency  2x / week    PT Duration  8 weeks    PT Treatment/Interventions  ADLs/Self Care Home Management;Cryotherapy;Electrical Stimulation;Functional mobility training;Stair training;Moist Heat;Therapeutic activities;Therapeutic exercise;Neuromuscular re-education;Passive range of motion;Scar mobilization;Manual techniques;Taping;Vasopneumatic Device    PT Next Visit Plan  quad sets, PROM into flexion, patellar  mobs, scar mobs, game ready for edema.  Sidelying hip abduction and prone extension with brace on. To MD 11/20    Consulted and Agree with Plan of Care  --       Patient will benefit from skilled therapeutic intervention in order to improve the following deficits and impairments:  Abnormal gait, Difficulty walking, Pain, Decreased endurance, Decreased activity tolerance, Decreased range of motion, Increased edema, Decreased mobility  Visit Diagnosis: Acute pain of right knee  Stiffness of right knee, not elsewhere classified  Other abnormalities of gait and mobility  Localized edema  Muscle weakness (generalized)     Problem List Patient Active Problem List   Diagnosis Date Noted  . Insomnia due to anxiety and fear 09/30/2016  . Restless leg syndrome, familial 09/30/2016  . Iron deficiency anemia 09/30/2016  . Hypertriglyceridemia 06/15/2016  . Hyperlipidemia with target LDL less than 130 06/15/2016  . Hyperglycemia 06/15/2016  . S/P radiofrequency ablation operation for arrhythmia 07/04/15 07/05/2015  . SVT (supraventricular tachycardia) ([place]) 07/04/2015  . Melanoma of skin ([place]) 04/16/2015  . Atrial flutter ([place]) 12/07/2012  . Essential hypertension 11/07/2012  . Ascending aortic aneurysm ([place]) 11/07/2012  . ED (erectile dysfunction) 11/15/2011    COCHRAN,JENNIFER, PTA 10/03/2017, 9:16 AM  [facility] [address], [place], [place], [address] Phone: [phone]   Fax:  [phone]  Name: Lee Lee MRN: [id number] Date of Birth: 1951-11-09

## 2017-10-05 ENCOUNTER — Ambulatory Visit: Payer: BLUE CROSS/BLUE SHIELD | Admitting: Physical Therapy

## 2017-10-05 ENCOUNTER — Encounter: Payer: Self-pay | Admitting: Physical Therapy

## 2017-10-05 DIAGNOSIS — M25661 Stiffness of right knee, not elsewhere classified: Secondary | ICD-10-CM

## 2017-10-05 DIAGNOSIS — R2689 Other abnormalities of gait and mobility: Secondary | ICD-10-CM

## 2017-10-05 DIAGNOSIS — M25561 Pain in right knee: Secondary | ICD-10-CM | POA: Diagnosis not present

## 2017-10-05 DIAGNOSIS — M6281 Muscle weakness (generalized): Secondary | ICD-10-CM

## 2017-10-05 DIAGNOSIS — R600 Localized edema: Secondary | ICD-10-CM

## 2017-10-05 NOTE — Therapy (Signed)
[facility] [address] [place], [place], [address] Phone: [phone]   Fax:  [phone]  Physical Therapy Treatment  Patient Details  Name: Lee Lee MRN: [id number] Date of Birth: 05/13/1951 Referring Provider: Rodell Perna, MD   Encounter Date: 10/05/2017  PT End of Session - 10/05/17 0853    Visit Number  6    Date for PT Re-Evaluation  11/16/17    PT Start Time  0845    PT Stop Time  0935    PT Time Calculation (min)  50 min    Equipment Utilized During Treatment  Right knee immobilizer    Activity Tolerance  Patient tolerated treatment well    Behavior During Therapy  [facility] for tasks assessed/performed       Past Medical History:  Diagnosis Date  . Alcohol abuse, episodic drinking behavior   . Arthritis    back & knees  . Atrial flutter ([place])    s/p RFCA 01/05/13  . Calcium oxalate renal stones   . Complication of anesthesia    makes him loopy  . ED (erectile dysfunction)   . Hemorrhoids   . Hypertension     Past Surgical History:  Procedure Laterality Date  . ABLATION OF DYSRHYTHMIC FOCUS  01/05/2013  . ARTHROSCOPIC REPAIR ACL    . COLONOSCOPY  11/2009   Dr. Benson Norway  . ROTATOR CUFF REPAIR      There were no vitals filed for this visit.  Subjective Assessment - 10/05/17 0854    Subjective  In the afternoon after last session my knee got pretty swollen, I iced it and next day was fine. Pt denies any giving way at the knee or increased pain. Pt did not have immoblizer on coming into PT session today.    Currently in Pain?  No/denies    Multiple Pain Sites  No         [facility] PT Assessment - 10/05/17 0001      PROM   Right Knee Flexion  104                  [facility] Adult PT Treatment/Exercise - 10/05/17 0001      Vasopneumatic   Number Minutes Vasopneumatic   15 minutes    Vasopnuematic Location   Knee    Vasopneumatic Pressure  Medium    Vasopneumatic Temperature   3  snowflakes      Manual Therapy   Manual Therapy  Joint mobilization;Passive ROM;Soft tissue mobilization    Manual therapy comments  Knee flexion, Passive/AA with foot on slider    Joint Mobilization  Patella mobs, scar mobs, quad and hams softiissue work,     Soft tissue mobilization  right quad, scar massage               PT Short Term Goals - 10/03/17 0914      PT SHORT TERM GOAL #3   Title  demonstrate good quad set on the Rt to improve Rt LE stability with gait    Time  4    Period  Weeks    Status  Achieved        PT Long Term Goals - 10/03/17 5638      PT LONG TERM GOAL #3   Title  demonstrate Rt knee P/ROM flexion to > or = to 95 degrees to allow for sitting without substitution    Time  8    Period  Weeks  Status  Achieved      PT LONG TERM GOAL #4   Title  demonstrate 3+/5 Rt quad strength to improve stabilization with gait    Time  8    Period  Weeks    Status  On-going            Plan - 10/05/17 6384    Clinical Impression Statement  Mild increase of swelling today compared to last session. PTA reminded pt he should be in the immobilizer when walking until MD tells him he can go without it. PROM 104 degrees today.     Rehab Potential  Good    Clinical Impairments Affecting Rehab Potential  Right quad tendon repair 09/12/2017; stay in knee immobilizer; PROM and quad isometrics    PT Duration  8 weeks    PT Treatment/Interventions  ADLs/Self Care Home Management;Cryotherapy;Electrical Stimulation;Functional mobility training;Stair training;Moist Heat;Therapeutic activities;Therapeutic exercise;Neuromuscular re-education;Passive range of motion;Scar mobilization;Manual techniques;Taping;Vasopneumatic Device    PT Next Visit Plan  quad sets, PROM into flexion, patellar mobs, scar mobs, game ready for edema.  Sidelying hip abduction and prone extension with brace on. To MD 11/20    PT Home Exercise Plan  progress as needed    Consulted and Agree with  Plan of Care  Patient       Patient will benefit from skilled therapeutic intervention in order to improve the following deficits and impairments:  Abnormal gait, Difficulty walking, Pain, Decreased endurance, Decreased activity tolerance, Decreased range of motion, Increased edema, Decreased mobility  Visit Diagnosis: Acute pain of right knee  Stiffness of right knee, not elsewhere classified  Other abnormalities of gait and mobility  Localized edema  Muscle weakness (generalized)     Problem List Patient Active Problem List   Diagnosis Date Noted  . Insomnia due to anxiety and fear 09/30/2016  . Restless leg syndrome, familial 09/30/2016  . Iron deficiency anemia 09/30/2016  . Hypertriglyceridemia 06/15/2016  . Hyperlipidemia with target LDL less than 130 06/15/2016  . Hyperglycemia 06/15/2016  . S/P radiofrequency ablation operation for arrhythmia 07/04/15 07/05/2015  . SVT (supraventricular tachycardia) ([place]) 07/04/2015  . Melanoma of skin ([place]) 04/16/2015  . Atrial flutter ([place]) 12/07/2012  . Essential hypertension 11/07/2012  . Ascending aortic aneurysm ([place]) 11/07/2012  . ED (erectile dysfunction) 11/15/2011    Anahla Bevis, PTA 10/05/2017, 9:21 AM  [facility] [address], [place], [place], [address] Phone: [phone]   Fax:  [phone]  Name: Lee Lee MRN: [id number] Date of Birth: Jan 28, 1951

## 2017-10-07 ENCOUNTER — Encounter: Payer: Self-pay | Admitting: Physical Therapy

## 2017-10-07 ENCOUNTER — Ambulatory Visit: Payer: BLUE CROSS/BLUE SHIELD | Admitting: Physical Therapy

## 2017-10-07 DIAGNOSIS — M25661 Stiffness of right knee, not elsewhere classified: Secondary | ICD-10-CM

## 2017-10-07 DIAGNOSIS — M6281 Muscle weakness (generalized): Secondary | ICD-10-CM

## 2017-10-07 DIAGNOSIS — R2689 Other abnormalities of gait and mobility: Secondary | ICD-10-CM

## 2017-10-07 DIAGNOSIS — M25561 Pain in right knee: Secondary | ICD-10-CM

## 2017-10-07 DIAGNOSIS — R600 Localized edema: Secondary | ICD-10-CM

## 2017-10-07 NOTE — Therapy (Signed)
[facility] [address] [place], [place], [address] Phone: [phone]   Fax:  [phone]  Physical Therapy Treatment  Patient Details  Name: Lee Lee MRN: [id number] Date of Birth: December 09, 1950 Referring Provider: Rodell Perna, MD   Encounter Date: 10/07/2017  PT End of Session - 10/07/17 0845    Visit Number  7    Date for PT Re-Evaluation  11/16/17    PT Start Time  0845    PT Stop Time  0930    PT Time Calculation (min)  45 min    Activity Tolerance  Patient tolerated treatment well    Behavior During Therapy  [facility] for tasks assessed/performed       Past Medical History:  Diagnosis Date  . Alcohol abuse, episodic drinking behavior   . Arthritis    back & knees  . Atrial flutter ([place])    s/p RFCA 01/05/13  . Calcium oxalate renal stones   . Complication of anesthesia    makes him loopy  . ED (erectile dysfunction)   . Hemorrhoids   . Hypertension     Past Surgical History:  Procedure Laterality Date  . ABLATION OF DYSRHYTHMIC FOCUS  01/05/2013  . ARTHROSCOPIC REPAIR ACL    . COLONOSCOPY  11/2009   Dr. Benson Norway  . ROTATOR CUFF REPAIR      There were no vitals filed for this visit.  Subjective Assessment - 10/07/17 0846    Subjective  No adverse effects after last session. Some edema persists but no pain. Pt ambulates into the clinic without immobilizer.     Pertinent History  Rt quad tendon repair: 09/12/17.  Quad isometrics and knee PROM per MD    Limitations  Standing;Walking    Patient Stated Goals  increase Rt knee A/ROM, improve strength, return to walking without limitation    Currently in Pain?  No/denies    Multiple Pain Sites  No         [facility] PT Assessment - 10/07/17 0001      PROM   Right Knee Flexion  110                  [facility] Adult PT Treatment/Exercise - 10/07/17 0001      Knee/Hip Exercises: Sidelying   Hip ABduction  --      Moist Heat Therapy   Number  Minutes Moist Heat  -- concurrent with manual work to scar and patella mobs      Vasopneumatic   Number Minutes Vasopneumatic   15 minutes    Vasopnuematic Location   Knee    Vasopneumatic Pressure  Medium    Vasopneumatic Temperature   3 snowflakes      Manual Therapy   Manual Therapy  Joint mobilization;Passive ROM;Soft tissue mobilization    Manual therapy comments  Knee flexion, Passive/AA with foot on slider    Joint Mobilization  Patella mobs, scar mobs, quad and hams softiissue work,     Soft tissue mobilization  right quad, scar massage               PT Short Term Goals - 10/03/17 0914      PT SHORT TERM GOAL #3   Title  demonstrate good quad set on the Rt to improve Rt LE stability with gait    Time  4    Period  Weeks    Status  Achieved        PT Long Term Goals -  10/03/17 0914      PT LONG TERM GOAL #3   Title  demonstrate Rt knee P/ROM flexion to > or = to 95 degrees to allow for sitting without substitution    Time  8    Period  Weeks    Status  Achieved      PT LONG TERM GOAL #4   Title  demonstrate 3+/5 Rt quad strength to improve stabilization with gait    Time  8    Period  Weeks    Status  On-going            Plan - 10/07/17 0845    Clinical Impression Statement  Pt has made tremendous progress this week with his PROM, measuring today at an easy 110 degrees. He did not bring his immobilizer so we did not perform any lifting and just focused on soft tissue work.     Rehab Potential  Good    Clinical Impairments Affecting Rehab Potential  Right quad tendon repair 09/12/2017; stay in knee immobilizer; PROM and quad isometrics    PT Frequency  2x / week    PT Duration  8 weeks    PT Treatment/Interventions  ADLs/Self Care Home Management;Cryotherapy;Electrical Stimulation;Functional mobility training;Stair training;Moist Heat;Therapeutic activities;Therapeutic exercise;Neuromuscular re-education;Passive range of motion;Scar  mobilization;Manual techniques;Taping;Vasopneumatic Device    PT Next Visit Plan  quad sets, PROM into flexion, patellar mobs, scar mobs, game ready for edema.  Sidelying hip abduction and prone extension with brace on. To MD 11/20    Consulted and Agree with Plan of Care  Patient       Patient will benefit from skilled therapeutic intervention in order to improve the following deficits and impairments:  Abnormal gait, Difficulty walking, Pain, Decreased endurance, Decreased activity tolerance, Decreased range of motion, Increased edema, Decreased mobility  Visit Diagnosis: Acute pain of right knee  Stiffness of right knee, not elsewhere classified  Other abnormalities of gait and mobility  Localized edema  Muscle weakness (generalized)     Problem List Patient Active Problem List   Diagnosis Date Noted  . Insomnia due to anxiety and fear 09/30/2016  . Restless leg syndrome, familial 09/30/2016  . Iron deficiency anemia 09/30/2016  . Hypertriglyceridemia 06/15/2016  . Hyperlipidemia with target LDL less than 130 06/15/2016  . Hyperglycemia 06/15/2016  . S/P radiofrequency ablation operation for arrhythmia 07/04/15 07/05/2015  . SVT (supraventricular tachycardia) ([place]) 07/04/2015  . Melanoma of skin ([place]) 04/16/2015  . Atrial flutter ([place]) 12/07/2012  . Essential hypertension 11/07/2012  . Ascending aortic aneurysm ([place]) 11/07/2012  . ED (erectile dysfunction) 11/15/2011    Everette Dimauro, PTA 10/07/2017, 9:21 AM  [facility] [address], [place], [place], [address] Phone: [phone]   Fax:  [phone]  Name: Lee Lee MRN: [id number] Date of Birth: [birth year]

## 2017-10-10 ENCOUNTER — Ambulatory Visit: Payer: BLUE CROSS/BLUE SHIELD | Admitting: Physical Therapy

## 2017-10-10 DIAGNOSIS — M6281 Muscle weakness (generalized): Secondary | ICD-10-CM

## 2017-10-10 DIAGNOSIS — R2689 Other abnormalities of gait and mobility: Secondary | ICD-10-CM

## 2017-10-10 DIAGNOSIS — M25561 Pain in right knee: Secondary | ICD-10-CM

## 2017-10-10 DIAGNOSIS — R600 Localized edema: Secondary | ICD-10-CM

## 2017-10-10 DIAGNOSIS — M25661 Stiffness of right knee, not elsewhere classified: Secondary | ICD-10-CM

## 2017-10-10 NOTE — Therapy (Signed)
[facility] [address] [place], [place], [address] Phone: [phone]   Fax:  [phone]  Physical Therapy Treatment  Patient Details  Name: Lee Lee MRN: [id number] Date of Birth: Apr 17, 1951 Referring Provider: Rodell Perna, MD   Encounter Date: 10/10/2017  PT End of Session - 10/10/17 1009    Visit Number  8    Date for PT Re-Evaluation  11/16/17    PT Start Time  0930    PT Stop Time  2800    PT Time Calculation (min)  45 min    Activity Tolerance  Patient tolerated treatment well;No increased pain    Behavior During Therapy  WFL for tasks assessed/performed       Past Medical History:  Diagnosis Date  . Alcohol abuse, episodic drinking behavior   . Arthritis    back & knees  . Atrial flutter ([place])    s/p RFCA 01/05/13  . Calcium oxalate renal stones   . Complication of anesthesia    makes him loopy  . ED (erectile dysfunction)   . Hemorrhoids   . Hypertension     Past Surgical History:  Procedure Laterality Date  . ABLATION OF DYSRHYTHMIC FOCUS  01/05/2013  . ARTHROSCOPIC REPAIR ACL    . COLONOSCOPY  11/2009   Dr. Benson Norway  . ROTATOR CUFF REPAIR      There were no vitals filed for this visit.  Subjective Assessment - 10/10/17 1002    Subjective  Pt reports things are going well. He has no pain currently and conitnues to work on his HEP.     Pertinent History  Rt quad tendon repair: 09/12/17.  Quad isometrics and knee PROM per MD    Limitations  Standing;Walking    Patient Stated Goals  increase Rt knee A/ROM, improve strength, return to walking without limitation    Currently in Pain?  No/denies         [facility] PT Assessment - 10/10/17 0001      PROM   Right Knee Flexion  120                  [facility] Adult PT Treatment/Exercise - 10/10/17 0001      Knee/Hip Exercises: Supine   Other Supine Knee/Hip Exercises  Seated Rt knee isometric hold ~25% effort 2x5 reps, at 20 deg  and 45 deg x5 sec hold       Moist Heat Therapy   Number Minutes Moist Heat  -- Lt and Rt side during manual treatment to Rt knee       Vasopneumatic   Number Minutes Vasopneumatic   15 minutes    Vasopnuematic Location   Knee    Vasopneumatic Pressure  Medium    Vasopneumatic Temperature   3 snowflakes      Manual Therapy   Joint Mobilization  4 way Rt patellar mobilizations    Soft tissue mobilization  Rt quadriceps in extension and again at 90 deg flexion to break up scar tissue and improve quadriceps flexibility; scar mobilization              PT Education - 10/10/17 1008    Education provided  Yes    Education Details  encouraged pt to complete hamstring stretches on the LLE as well as the Rt     Person(s) Educated  Patient    Methods  Explanation    Comprehension  Verbalized understanding       PT Short Term Goals -  10/03/17 0914      PT SHORT TERM GOAL #3   Title  demonstrate good quad set on the Rt to improve Rt LE stability with gait    Time  4    Period  Weeks    Status  Achieved        PT Long Term Goals - 10/03/17 4585      PT LONG TERM GOAL #3   Title  demonstrate Rt knee P/ROM flexion to > or = to 95 degrees to allow for sitting without substitution    Time  8    Period  Weeks    Status  Achieved      PT LONG TERM GOAL #4   Title  demonstrate 3+/5 Rt quad strength to improve stabilization with gait    Time  8    Period  Weeks    Status  On-going            Plan - 10/10/17 1009    Clinical Impression Statement  Pt continues to make good progress towards his goals with gradually improved Rt knee ROM. He demonstrated 120 deg of Rt knee flexion following today's manual techniques and continues to complete his HEP at home without difficulty. Completed gentle isometrics this session, noting significant fatigue and muscle shaking. Pt did not report any pain with today's activities and would continue to benefit from skilled PT to gradually  progress ROM, strength, endurance and proprioception as allowed post operatively.     Rehab Potential  Good    Clinical Impairments Affecting Rehab Potential  Right quad tendon repair 09/12/2017; stay in knee immobilizer; PROM and quad isometrics    PT Frequency  2x / week    PT Duration  8 weeks    PT Treatment/Interventions  ADLs/Self Care Home Management;Cryotherapy;Electrical Stimulation;Functional mobility training;Stair training;Moist Heat;Therapeutic activities;Therapeutic exercise;Neuromuscular re-education;Passive range of motion;Scar mobilization;Manual techniques;Taping;Vasopneumatic Device    PT Next Visit Plan  quad sets, PROM into flexion and patellar mobs/scar mobs as needed, game ready for edema.  Sidelying hip abduction and prone extension with brace on. To MD 11/20    Consulted and Agree with Plan of Care  Patient       Patient will benefit from skilled therapeutic intervention in order to improve the following deficits and impairments:  Abnormal gait, Difficulty walking, Pain, Decreased endurance, Decreased activity tolerance, Decreased range of motion, Increased edema, Decreased mobility  Visit Diagnosis: Acute pain of right knee  Stiffness of right knee, not elsewhere classified  Other abnormalities of gait and mobility  Localized edema  Muscle weakness (generalized)     Problem List Patient Active Problem List   Diagnosis Date Noted  . Insomnia due to anxiety and fear 09/30/2016  . Restless leg syndrome, familial 09/30/2016  . Iron deficiency anemia 09/30/2016  . Hypertriglyceridemia 06/15/2016  . Hyperlipidemia with target LDL less than 130 06/15/2016  . Hyperglycemia 06/15/2016  . S/P radiofrequency ablation operation for arrhythmia 07/04/15 07/05/2015  . SVT (supraventricular tachycardia) ([place]) 07/04/2015  . Melanoma of skin ([place]) 04/16/2015  . Atrial flutter ([place]) 12/07/2012  . Essential hypertension 11/07/2012  . Ascending aortic aneurysm ([place])  11/07/2012  . ED (erectile dysfunction) 11/15/2011    10:14 AM,10/10/17 Elly Modena PT, DPT [facility] [address] [place], [place], [address] Phone: [phone]   Fax:  [phone]  Name: Basilio Meadow MRN: [id number] Date of Birth: December 25, 1950

## 2017-10-12 ENCOUNTER — Encounter: Payer: Self-pay | Admitting: Physical Therapy

## 2017-10-12 ENCOUNTER — Ambulatory Visit: Payer: BLUE CROSS/BLUE SHIELD | Admitting: Physical Therapy

## 2017-10-12 DIAGNOSIS — R2689 Other abnormalities of gait and mobility: Secondary | ICD-10-CM

## 2017-10-12 DIAGNOSIS — M25561 Pain in right knee: Secondary | ICD-10-CM

## 2017-10-12 DIAGNOSIS — R600 Localized edema: Secondary | ICD-10-CM

## 2017-10-12 DIAGNOSIS — M6281 Muscle weakness (generalized): Secondary | ICD-10-CM

## 2017-10-12 DIAGNOSIS — M25661 Stiffness of right knee, not elsewhere classified: Secondary | ICD-10-CM

## 2017-10-12 NOTE — Therapy (Signed)
[facility] [address], [place], [place], [address] Phone: [phone]   Fax:  [phone]  Physical Therapy Treatment  Patient Details  Name: Lee Lee MRN: [id number] Date of Birth: [birth year] Referring Provider: Rodell Perna, MD   Encounter Date: 10/12/2017  PT End of Session - 10/12/17 0829    Visit Number  9    Date for PT Re-Evaluation  11/16/17    PT Start Time  0829    PT Stop Time  0915    PT Time Calculation (min)  46 min    Activity Tolerance  Patient tolerated treatment well;No increased pain    Behavior During Therapy  WFL for tasks assessed/performed       Past Medical History:  Diagnosis Date  . Alcohol abuse, episodic drinking behavior   . Arthritis    back & knees  . Atrial flutter ([place])    s/p RFCA 01/05/13  . Calcium oxalate renal stones   . Complication of anesthesia    makes him loopy  . ED (erectile dysfunction)   . Hemorrhoids   . Hypertension     Past Surgical History:  Procedure Laterality Date  . ABLATION OF DYSRHYTHMIC FOCUS  01/05/2013  . ARTHROSCOPIC REPAIR ACL    . COLONOSCOPY  11/2009   Dr. Benson Norway  . ROTATOR CUFF REPAIR      There were no vitals filed for this visit.  Subjective Assessment - 10/12/17 0858    Subjective  Pt's wife had TKR yesterday. Pt reports being at hospital 7 hrs and basically exercised his knee " the whole time." He reports this included quad sets, leg lifts ( no immoblizer), and heel slides/knee bends.     Pertinent History  Rt quad tendon repair: 09/12/17.  Quad isometrics and knee PROM per MD    Limitations  Standing;Walking    Currently in Pain?  Yes    Pain Score  6     Pain Location  Knee    Pain Orientation  Right patella, about 100 degrees of knee flexion    Pain Descriptors / Indicators  Sharp    Aggravating Factors   when bent around 100 degrees of flexion    Pain Relieving Factors  at rest no pain, no pain with quad set    Multiple Pain Sites  No                      [facility] Adult PT Treatment/Exercise - 10/12/17 0001      Moist Heat Therapy   Number Minutes Moist Heat  -- Concurrent to hamstrings and quads during manual work       Vasopneumatic   Number Minutes Vasopneumatic   15 minutes    Vasopnuematic Location   Knee    Vasopneumatic Pressure  Medium    Vasopneumatic Temperature   3 snowflakes      Manual Therapy   Manual therapy comments  Attempted Passive knee flexion: stopped due to a sharp pain along the patella     Joint Mobilization  Patella mobs, scar mobs, quad and hams softiissue work,     Astronomer  right quad, scar massage               PT Short Term Goals - 10/03/17 0914      PT SHORT TERM GOAL #3   Title  demonstrate good quad set on the Rt to improve Rt LE stability with gait  Time  4    Period  Weeks    Status  Achieved        PT Long Term Goals - 10/03/17 0914      PT LONG TERM GOAL #3   Title  demonstrate Rt knee P/ROM flexion to > or = to 95 degrees to allow for sitting without substitution    Time  8    Period  Weeks    Status  Achieved      PT LONG TERM GOAL #4   Title  demonstrate 3+/5 Rt quad strength to improve stabilization with gait    Time  8    Period  Weeks    Status  On-going            Plan - 10/12/17 0830    Clinical Impression Statement  Pt reports he has had a sharp pain ontop pf his patella when he bends it since being at the hospital with his wife yesterday. There appears to be more edema medially and laterally to Rt knee, no pain with patella mobs, scar mobs for palpation to quadriceps. Pain was reporduced at 100 degrees of knee flexion. Due to the sharp nature of pain we help PROM with verbal instructions to pt to take it easy and rest the knee until his next appt on Friday.     Rehab Potential  Good    Clinical Impairments Affecting Rehab Potential  Right quad tendon repair 09/12/2017; stay in knee  immobilizer; PROM and quad isometrics    PT Frequency  2x / week    PT Duration  8 weeks    PT Treatment/Interventions  ADLs/Self Care Home Management;Cryotherapy;Electrical Stimulation;Functional mobility training;Stair training;Moist Heat;Therapeutic activities;Therapeutic exercise;Neuromuscular re-education;Passive range of motion;Scar mobilization;Manual techniques;Taping;Vasopneumatic Device    PT Next Visit Plan   Assess pain.  If better, continue with quad sets, PROM into flexion and patellar mobs/scar mobs as needed, game ready for edema.  Sidelying hip abduction and prone extension with brace on. Will need MD note on Monday for MD appt on 11/20.    Consulted and Agree with Plan of Care  Patient       Patient will benefit from skilled therapeutic intervention in order to improve the following deficits and impairments:  Abnormal gait, Difficulty walking, Pain, Decreased endurance, Decreased activity tolerance, Decreased range of motion, Increased edema, Decreased mobility  Visit Diagnosis: Acute pain of right knee  Stiffness of right knee, not elsewhere classified  Other abnormalities of gait and mobility  Localized edema  Muscle weakness (generalized)     Problem List Patient Active Problem List   Diagnosis Date Noted  . Insomnia due to anxiety and fear 09/30/2016  . Restless leg syndrome, familial 09/30/2016  . Iron deficiency anemia 09/30/2016  . Hypertriglyceridemia 06/15/2016  . Hyperlipidemia with target LDL less than 130 06/15/2016  . Hyperglycemia 06/15/2016  . S/P radiofrequency ablation operation for arrhythmia 07/04/15 07/05/2015  . SVT (supraventricular tachycardia) ([place]) 07/04/2015  . Melanoma of skin ([place]) 04/16/2015  . Atrial flutter ([place]) 12/07/2012  . Essential hypertension 11/07/2012  . Ascending aortic aneurysm ([place]) 11/07/2012  . ED (erectile dysfunction) 11/15/2011    Syndey Jaskolski,PTA 10/12/2017, 9:18 AM  [facility] [address] [place], [place], [address] Phone: [phone]   Fax:  [phone]  Name: Lee Lee MRN: [id number] Date of Birth: 1951/10/05

## 2017-10-14 ENCOUNTER — Encounter: Payer: Self-pay | Admitting: Physical Therapy

## 2017-10-14 ENCOUNTER — Ambulatory Visit: Payer: BLUE CROSS/BLUE SHIELD | Admitting: Physical Therapy

## 2017-10-14 DIAGNOSIS — R600 Localized edema: Secondary | ICD-10-CM

## 2017-10-14 DIAGNOSIS — R2689 Other abnormalities of gait and mobility: Secondary | ICD-10-CM

## 2017-10-14 DIAGNOSIS — M25561 Pain in right knee: Secondary | ICD-10-CM

## 2017-10-14 DIAGNOSIS — M6281 Muscle weakness (generalized): Secondary | ICD-10-CM

## 2017-10-14 DIAGNOSIS — M25661 Stiffness of right knee, not elsewhere classified: Secondary | ICD-10-CM

## 2017-10-14 NOTE — Therapy (Signed)
[facility] [address], [place], [place], [address] Phone: [phone]   Fax:  [phone]  Physical Therapy Treatment  Patient Details  Name: Lee Lee MRN: [id number] Date of Birth: 01-06-1951 Referring Provider: Rodell Perna, MD   Encounter Date: 10/14/2017  PT End of Session - 10/14/17 0833    Visit Number  10    Date for PT Re-Evaluation  11/16/17    PT Start Time  7408    PT Stop Time  0918    PT Time Calculation (min)  45 min    Activity Tolerance  Patient tolerated treatment well;No increased pain    Behavior During Therapy  WFL for tasks assessed/performed       Past Medical History:  Diagnosis Date  . Alcohol abuse, episodic drinking behavior   . Arthritis    back & knees  . Atrial flutter ([place])    s/p RFCA 01/05/13  . Calcium oxalate renal stones   . Complication of anesthesia    makes him loopy  . ED (erectile dysfunction)   . Hemorrhoids   . Hypertension     Past Surgical History:  Procedure Laterality Date  . ABLATION OF DYSRHYTHMIC FOCUS  01/05/2013  . ARTHROSCOPIC REPAIR ACL    . ATRIAL FLUTTER ABLATION N/A 01/05/2013   Procedure: ATRIAL FLUTTER ABLATION;  Surgeon: Evans Lance, MD;  Location: [facility] CATH LAB;  Service: Cardiovascular;  Laterality: N/A;  . COLONOSCOPY  11/2009   Dr. Benson Norway  . ELECTROPHYSIOLOGIC STUDY N/A 07/04/2015   Procedure: A-Flutter Ablation;  Surgeon: Evans Lance, MD;  Location: [place];  Service: Cardiovascular;  Laterality: N/A;  . ROTATOR CUFF REPAIR      There were no vitals filed for this visit.  Subjective Assessment - 10/14/17 0834    Subjective  Pt feels like he can bend the knee slightly farther than last session before he gets pain. There is still some pain ontop of the patella. He reports no giving way of the knee. He reports taking it easy but is caring for his wife post her TKR.     Pertinent History  Rt quad tendon repair: 09/12/17.   Quad isometrics and knee PROM per MD    Currently in Pain?  Yes    Pain Score  5     Pain Location  Knee    Pain Orientation  Right    Pain Descriptors / Indicators  Sharp    Multiple Pain Sites  No         [facility] PT Assessment - 10/14/17 0001      Observation/Other Assessments   Focus on Therapeutic Outcomes (FOTO)   48% limited      PROM   Right Knee Flexion  107                  [facility] Adult PT Treatment/Exercise - 10/14/17 0001      Moist Heat Therapy   Number Minutes Moist Heat  -- MHP to hamstrings during manual work.      Vasopneumatic   Number Minutes Vasopneumatic   15 minutes    Vasopnuematic Location   Knee    Vasopneumatic Pressure  Medium    Vasopneumatic Temperature   3 snowflakes      Manual Therapy   Manual therapy comments  Gentle knee flexion PROM     Joint Mobilization  Patella mobs, scar mobs, quad and hams softiissue work,  PT Short Term Goals - 10/03/17 0914      PT SHORT TERM GOAL #3   Title  demonstrate good quad set on the Rt to improve Rt LE stability with gait    Time  4    Period  Weeks    Status  Achieved        PT Long Term Goals - 10/03/17 3383      PT LONG TERM GOAL #3   Title  demonstrate Rt knee P/ROM flexion to > or = to 95 degrees to allow for sitting without substitution    Time  8    Period  Weeks    Status  Achieved      PT LONG TERM GOAL #4   Title  demonstrate 3+/5 Rt quad strength to improve stabilization with gait    Time  8    Period  Weeks    Status  On-going            Plan - 10/14/17 2919    Clinical Impression Statement  Knee slightly less swollen than last session. Pt also reporting slightly less pain. Pain still just proximal to patella and still describes as sharp. He could bend farther today before the pain came on, about 107 degrees. He sees the MD on Tuesday and will discuss this pain.     Rehab Potential  Good    Clinical Impairments Affecting Rehab Potential   Right quad tendon repair 09/12/2017; stay in knee immobilizer; PROM and quad isometrics    PT Frequency  2x / week    PT Duration  8 weeks    PT Treatment/Interventions  ADLs/Self Care Home Management;Cryotherapy;Electrical Stimulation;Functional mobility training;Stair training;Moist Heat;Therapeutic activities;Therapeutic exercise;Neuromuscular re-education;Passive range of motion;Scar mobilization;Manual techniques;Taping;Vasopneumatic Device    PT Next Visit Plan  Route MD note next vist, assess pain and continue with painfree PROm and manual work to scar.     Consulted and Agree with Plan of Care  Patient       Patient will benefit from skilled therapeutic intervention in order to improve the following deficits and impairments:  Abnormal gait, Difficulty walking, Pain, Decreased endurance, Decreased activity tolerance, Decreased range of motion, Increased edema, Decreased mobility  Visit Diagnosis: Acute pain of right knee  Stiffness of right knee, not elsewhere classified  Other abnormalities of gait and mobility  Localized edema  Muscle weakness (generalized)     Problem List Patient Active Problem List   Diagnosis Date Noted  . Insomnia due to anxiety and fear 09/30/2016  . Restless leg syndrome, familial 09/30/2016  . Iron deficiency anemia 09/30/2016  . Hypertriglyceridemia 06/15/2016  . Hyperlipidemia with target LDL less than 130 06/15/2016  . Hyperglycemia 06/15/2016  . S/P radiofrequency ablation operation for arrhythmia 07/04/15 07/05/2015  . SVT (supraventricular tachycardia) ([place]) 07/04/2015  . Melanoma of skin ([place]) 04/16/2015  . Atrial flutter ([place]) 12/07/2012  . Essential hypertension 11/07/2012  . Ascending aortic aneurysm ([place]) 11/07/2012  . ED (erectile dysfunction) 11/15/2011    Marianny Goris, PTA 10/14/2017, 9:15 AM  [facility] [address] [place], [place], [address] Phone:  [phone]   Fax:  [phone]  Name: Lee Lee MRN: [id number] Date of Birth: June 29, 1951

## 2017-10-17 ENCOUNTER — Encounter: Payer: BLUE CROSS/BLUE SHIELD | Admitting: Physical Therapy

## 2017-10-18 ENCOUNTER — Encounter (INDEPENDENT_AMBULATORY_CARE_PROVIDER_SITE_OTHER): Payer: Self-pay | Admitting: Orthopaedic Surgery

## 2017-10-18 ENCOUNTER — Ambulatory Visit (INDEPENDENT_AMBULATORY_CARE_PROVIDER_SITE_OTHER): Payer: BLUE CROSS/BLUE SHIELD | Admitting: Orthopaedic Surgery

## 2017-10-18 VITALS — BP 153/79 | HR 69 | Ht 71.0 in | Wt 225.0 lb

## 2017-10-18 DIAGNOSIS — S76111A Strain of right quadriceps muscle, fascia and tendon, initial encounter: Secondary | ICD-10-CM | POA: Insufficient documentation

## 2017-10-18 DIAGNOSIS — S76111S Strain of right quadriceps muscle, fascia and tendon, sequela: Secondary | ICD-10-CM

## 2017-10-18 NOTE — Progress Notes (Signed)
   Post-Op Visit Note   Patient: Lee Lee           Date of Birth: December 21, 1950           MRN: [id number] Visit Date: 10/18/2017 PCP: Janith Lima, MD   Assessment & Plan: 5-1/2 weeks post quad tendon repair.  He is here today does not have his knee immobilizer and does not have his crutches.  He has 120 degrees flexion.  There is some thickness at the quad tendon repair.  I discussed with him he needs to keep his knee immobilizer on and rested for 2 weeks and stop his therapy.  Once a day he can gently bend his knee to keep his flexion.  Chief Complaint:  Chief Complaint  Patient presents with  . Right Knee - Routine Post Op   Visit Diagnoses:  1. Quadriceps tendon rupture, right, sequela     Plan: He will rest it for 2 weeks in the knee immobilizer recheck 2 weeks.  Follow-Up Instructions: Return in about 2 weeks (around 11/01/2017).   Orders:  No orders of the defined types were placed in this encounter.  No orders of the defined types were placed in this encounter.   Imaging: No results found.  PMFS History: Patient Active Problem List   Diagnosis Date Noted  . Quadriceps tendon rupture, right, sequela 10/18/2017  . Insomnia due to anxiety and fear 09/30/2016  . Restless leg syndrome, familial 09/30/2016  . Iron deficiency anemia 09/30/2016  . Hypertriglyceridemia 06/15/2016  . Hyperlipidemia with target LDL less than 130 06/15/2016  . Hyperglycemia 06/15/2016  . S/P radiofrequency ablation operation for arrhythmia 07/04/15 07/05/2015  . SVT (supraventricular tachycardia) ([place]) 07/04/2015  . Melanoma of skin ([place]) 04/16/2015  . Atrial flutter ([place]) 12/07/2012  . Essential hypertension 11/07/2012  . Ascending aortic aneurysm ([place]) 11/07/2012  . ED (erectile dysfunction) 11/15/2011   Past Medical History:  Diagnosis Date  . Alcohol abuse, episodic drinking behavior   . Arthritis    back & knees  . Atrial flutter ([place])    s/p RFCA 01/05/13  . Calcium  oxalate renal stones   . Complication of anesthesia    makes him loopy  . ED (erectile dysfunction)   . Hemorrhoids   . Hypertension     Family History  Problem Relation Age of Onset  . Valvular heart disease Mother   . Lymphoma Mother     Past Surgical History:  Procedure Laterality Date  . A-Flutter Ablation N/A 07/04/2015   Performed by Evans Lance, MD at [place] [facility]  . ABLATION OF DYSRHYTHMIC FOCUS  01/05/2013  . ARTHROSCOPIC REPAIR ACL    . ATRIAL FLUTTER ABLATION N/A 01/05/2013   Performed by Evans Lance, MD at [facility] CATH [facility]  . COLONOSCOPY  11/2009   Dr. Benson Norway  . ROTATOR CUFF REPAIR     Social History   Occupational History  . Not on file  Tobacco Use  . Smoking status: Former Smoker    Last attempt to quit: 01/06/1988    Years since quitting: 29.8  . Smokeless tobacco: Current User    Types: Chew  . Tobacco comment: chews tobacco when playing golf only  Substance and Sexual Activity  . Alcohol use: Yes    Alcohol/week: 3.0 oz    Types: 5 Glasses of wine per week    Comment: wine daily  . Drug use: No  . Sexual activity: Yes

## 2017-10-19 ENCOUNTER — Encounter: Payer: BLUE CROSS/BLUE SHIELD | Admitting: Physical Therapy

## 2017-10-24 ENCOUNTER — Encounter: Payer: BLUE CROSS/BLUE SHIELD | Admitting: Physical Therapy

## 2017-10-26 ENCOUNTER — Encounter: Payer: BLUE CROSS/BLUE SHIELD | Admitting: Physical Therapy

## 2017-10-26 ENCOUNTER — Other Ambulatory Visit: Payer: Self-pay | Admitting: Internal Medicine

## 2017-10-26 DIAGNOSIS — D508 Other iron deficiency anemias: Secondary | ICD-10-CM

## 2017-10-26 DIAGNOSIS — G2581 Restless legs syndrome: Secondary | ICD-10-CM

## 2017-10-28 ENCOUNTER — Encounter: Payer: BLUE CROSS/BLUE SHIELD | Admitting: Physical Therapy

## 2017-10-31 ENCOUNTER — Encounter: Payer: Self-pay | Admitting: Internal Medicine

## 2017-10-31 ENCOUNTER — Other Ambulatory Visit (INDEPENDENT_AMBULATORY_CARE_PROVIDER_SITE_OTHER): Payer: BLUE CROSS/BLUE SHIELD

## 2017-10-31 ENCOUNTER — Encounter: Payer: BLUE CROSS/BLUE SHIELD | Admitting: Physical Therapy

## 2017-10-31 ENCOUNTER — Ambulatory Visit (INDEPENDENT_AMBULATORY_CARE_PROVIDER_SITE_OTHER): Payer: BLUE CROSS/BLUE SHIELD | Admitting: Internal Medicine

## 2017-10-31 VITALS — BP 142/80 | HR 58 | Temp 98.3°F | Resp 16 | Wt 229.0 lb

## 2017-10-31 DIAGNOSIS — R739 Hyperglycemia, unspecified: Secondary | ICD-10-CM

## 2017-10-31 DIAGNOSIS — I10 Essential (primary) hypertension: Secondary | ICD-10-CM | POA: Diagnosis not present

## 2017-10-31 DIAGNOSIS — D508 Other iron deficiency anemias: Secondary | ICD-10-CM | POA: Diagnosis not present

## 2017-10-31 DIAGNOSIS — Z23 Encounter for immunization: Secondary | ICD-10-CM

## 2017-10-31 LAB — CBC WITH DIFFERENTIAL/PLATELET
BASOS ABS: 0 10*3/uL (ref 0.0–0.1)
BASOS PCT: 0.4 % (ref 0.0–3.0)
EOS ABS: 0.1 10*3/uL (ref 0.0–0.7)
Eosinophils Relative: 2 % (ref 0.0–5.0)
HEMATOCRIT: 44.3 % (ref 39.0–52.0)
HEMOGLOBIN: 15 g/dL (ref 13.0–17.0)
LYMPHS PCT: 23.2 % (ref 12.0–46.0)
Lymphs Abs: 1.4 10*3/uL (ref 0.7–4.0)
MCHC: 33.9 g/dL (ref 30.0–36.0)
MCV: 104.5 fl — AB (ref 78.0–100.0)
MONOS PCT: 9.1 % (ref 3.0–12.0)
Monocytes Absolute: 0.5 10*3/uL (ref 0.1–1.0)
NEUTROS ABS: 4 10*3/uL (ref 1.4–7.7)
Neutrophils Relative %: 65.3 % (ref 43.0–77.0)
PLATELETS: 181 10*3/uL (ref 150.0–400.0)
RBC: 4.24 Mil/uL (ref 4.22–5.81)
RDW: 13.8 % (ref 11.5–15.5)
WBC: 6.1 10*3/uL (ref 4.0–10.5)

## 2017-10-31 LAB — BASIC METABOLIC PANEL
BUN: 21 mg/dL (ref 6–23)
CHLORIDE: 101 meq/L (ref 96–112)
CO2: 29 meq/L (ref 19–32)
CREATININE: 0.87 mg/dL (ref 0.40–1.50)
Calcium: 10.1 mg/dL (ref 8.4–10.5)
GFR: 93.63 mL/min (ref >60.00)
GLUCOSE: 122 mg/dL — AB (ref 70–99)
POTASSIUM: 4.3 meq/L (ref 3.5–5.1)
Sodium: 141 mEq/L (ref 135–145)

## 2017-10-31 LAB — IBC PANEL
IRON: 142 ug/dL (ref 42–165)
Saturation Ratios: 38.6 % (ref 20.0–50.0)
TRANSFERRIN: 263 mg/dL (ref 212.0–360.0)

## 2017-10-31 LAB — HEMOGLOBIN A1C: Hgb A1c MFr Bld: 4.9 % (ref 4.6–6.5)

## 2017-10-31 LAB — FERRITIN: Ferritin: 110.3 ng/mL (ref 22.0–322.0)

## 2017-10-31 NOTE — Patient Instructions (Signed)

## 2017-10-31 NOTE — Progress Notes (Signed)
Subjective:  Patient ID: Lee Lee, male    DOB: [birth year]  Age: 66 y.o. MRN: [id number]  CC: Hypertension and Anemia   HPI Lee Lee presents for f/up - He feels well and offers no complaints.  He wants to stop taking the iron tablet because it costs $100 a month.  He tells me his blood pressure has been well controlled and he has had no recent episodes of chest pain or shortness of breath.  Outpatient Medications Prior to Visit  Medication Sig Dispense Refill  . aspirin EC 81 MG tablet Take 1 tablet (81 mg total) by mouth daily. 90 tablet 3  . Azilsartan-Chlorthalidone (EDARBYCLOR) 40-12.5 MG TABS Take 1 tablet by mouth daily. 30 tablet 5  . L-Methylfolate-Algae (DEPLIN 15 PO) Take 1 tablet by mouth daily.     . metoprolol succinate (TOPROL-XL) 25 MG 24 hr tablet TAKE ONE TABLET BY MOUTH TWICE A DAY TAKE WITH OR IMMEDIATELY FOLLOWING A MEAL 180 tablet 3  . sildenafil (REVATIO) 20 MG tablet TAKE ONE TO FIVE TABLETS  BY MOUTH DAILY AS NEEDED (Patient taking differently: TAKE ONE TO FIVE TABLETS  BY MOUTH DAILY AS NEEDED FOR ED) 50 tablet 11  . traZODone (DESYREL) 150 MG tablet Take 0.5 tablet by mouth daily at bedtime as needed for sleep    . vortioxetine HBr (TRINTELLIX) 10 MG TABS Take 10 mg by mouth daily.    Marland Kitchen Fe Cbn-Fe Gluc-FA-B12-C-DSS (FERRALET 90) 90-1 MG TABS Take 1 tablet by mouth daily. 90 each 0  . HYDROcodone-acetaminophen (NORCO) 5-325 MG tablet Take 1-2 tablets by mouth every 6 (six) hours as needed for moderate pain. (Patient not taking: Reported on 10/18/2017) 30 tablet 0  . HYDROcodone-acetaminophen (NORCO/VICODIN) 5-325 MG tablet Take 1 tablet by mouth every 4 (four) hours as needed. (Patient not taking: Reported on 09/09/2017) 12 tablet 0   No facility-administered medications prior to visit.     ROS Review of Systems  Constitutional: Negative.  Negative for appetite change and fatigue.  HENT: Negative.   Eyes: Negative.  Negative for visual  disturbance.  Respiratory: Negative.  Negative for cough, chest tightness, shortness of breath and wheezing.   Cardiovascular: Negative for chest pain, palpitations and leg swelling.  Gastrointestinal: Negative for abdominal pain, blood in stool, constipation, diarrhea, nausea and vomiting.  Endocrine: Negative.   Genitourinary: Negative.  Negative for difficulty urinating and frequency.  Musculoskeletal: Negative.  Negative for back pain, myalgias and neck pain.  Skin: Negative.  Negative for color change and rash.  Allergic/Immunologic: Negative.   Neurological: Negative.  Negative for dizziness, weakness and numbness.  Hematological: Negative for adenopathy. Does not bruise/bleed easily.  Psychiatric/Behavioral: Negative.     Objective:  BP (!) 142/80   Pulse (!) 58   Temp 98.3 F (36.8 C)   Resp 16   Wt 229 lb (103.9 kg)   SpO2 99%   BMI 31.94 kg/m   BP Readings from Last 3 Encounters:  10/31/17 (!) 142/80  10/18/17 (!) 153/79  09/16/17 105/67    Wt Readings from Last 3 Encounters:  10/31/17 229 lb (103.9 kg)  10/18/17 225 lb (102.1 kg)  09/16/17 225 lb (102.1 kg)    Physical Exam  Constitutional: He is oriented to person, place, and time. No distress.  HENT:  Mouth/Throat: Oropharynx is clear and moist. No oropharyngeal exudate.  Eyes: Conjunctivae are normal. Right eye exhibits no discharge. Left eye exhibits no discharge. No scleral icterus.  Neck: Normal range of motion. Neck  supple. No JVD present.  Cardiovascular: Normal rate, regular rhythm and normal heart sounds.  No murmur heard. Pulmonary/Chest: Effort normal and breath sounds normal. He has no wheezes. He has no rales.  Abdominal: Soft. Bowel sounds are normal. He exhibits no mass. There is no rebound and no guarding.  Musculoskeletal: Normal range of motion. He exhibits no edema, tenderness or deformity.  Lymphadenopathy:    He has no cervical adenopathy.  Neurological: He is alert and oriented to  person, place, and time.  Skin: Skin is warm and dry. No rash noted. He is not diaphoretic. No erythema. No pallor.  Vitals reviewed.   Lab Results  Component Value Date   WBC 6.1 10/31/2017   HGB 15.0 10/31/2017   HCT 44.3 10/31/2017   PLT 181.0 10/31/2017   GLUCOSE 122 (H) 10/31/2017   CHOL 194 04/28/2017   TRIG 82.0 04/28/2017   HDL 72.30 04/28/2017   LDLCALC 105 (H) 04/28/2017   ALT 16 06/15/2016   AST 23 06/15/2016   NA 141 10/31/2017   K 4.3 10/31/2017   CL 101 10/31/2017   CREATININE 0.87 10/31/2017   BUN 21 10/31/2017   CO2 29 10/31/2017   TSH 1.11 06/15/2016   INR 1.05 09/24/2012   HGBA1C 4.9 10/31/2017    Dg Knee Complete 4 Views Right  Result Date: 09/08/2017 CLINICAL DATA:  Unable to extend leg, fell while blowing leaves in the yard, anterior RIGHT knee pain question patellar fracture versus quadriceps tendon rupture EXAM: RIGHT KNEE - COMPLETE 4+ VIEW COMPARISON:  None FINDINGS: Osseous mineralization normal. Joint spaces preserved. Patellar spur formation at quadriceps tendon insertion. No acute fracture, dislocation or bone destruction. Disrupted soft tissue silhouette superior to the patella highly suspicious for a quadriceps tendon injury. IMPRESSION: No acute osseous abnormalities. Abnormal soft tissue silhouette superior to the patella highly suspicious for a quadriceps tendon injury; this can be better assessed by MR. Electronically Signed   By: Lavonia Dana M.D.   On: 09/08/2017 19:36   Xr Knee 3 View Left  Result Date: 09/09/2017 X-rays left knee demonstrates medial compartment arthritis bone-on-bone changes no acute fracture. Patellas normally located. There is some quadriceps calcification at the patellar tendon insertion site. Impression: Primarily medial compartment arthritis. Negative for acute fracture post fall.   Assessment & Plan:   Lee Lee was seen today for hypertension and anemia.  Diagnoses and all orders for this visit:  Other iron  deficiency anemia- His hemoglobin is up to 15 and his iron level is up to 142.  I have told him he can stop taking the iron replacement. -     CBC with Differential/Platelet; Future -     IBC panel; Future -     Ferritin; Future  Essential hypertension- His blood pressure is well controlled.  Electrolytes and renal function are normal.  Will continue the ARB/thiazide diuretic combination. -     Basic metabolic panel; Future  Hyperglycemia- His A1c is down to 4.9%.  Marked improvement noted with lifestyle modifications. -     Hemoglobin A1c; Future  Need for influenza vaccination -     Flu Vaccine QUAD 36+ mos IM   I have discontinued Bradie Bonfield's HYDROcodone-acetaminophen, HYDROcodone-acetaminophen, and FERRALET 90. I am also having him maintain his vortioxetine HBr, L-Methylfolate-Algae (DEPLIN 15 PO), aspirin EC, traZODone, metoprolol succinate, sildenafil, and Azilsartan-Chlorthalidone.  No orders of the defined types were placed in this encounter.    Follow-up: Return in about 6 months (around 05/01/2018).  Scarlette Calico,  MD

## 2017-11-02 ENCOUNTER — Ambulatory Visit (INDEPENDENT_AMBULATORY_CARE_PROVIDER_SITE_OTHER): Payer: BLUE CROSS/BLUE SHIELD | Admitting: Orthopaedic Surgery

## 2017-11-02 ENCOUNTER — Encounter: Payer: BLUE CROSS/BLUE SHIELD | Admitting: Physical Therapy

## 2017-11-02 ENCOUNTER — Encounter (INDEPENDENT_AMBULATORY_CARE_PROVIDER_SITE_OTHER): Payer: Self-pay | Admitting: Orthopaedic Surgery

## 2017-11-02 VITALS — BP 121/66 | HR 69 | Ht 71.0 in | Wt 228.0 lb

## 2017-11-02 DIAGNOSIS — S76111D Strain of right quadriceps muscle, fascia and tendon, subsequent encounter: Secondary | ICD-10-CM

## 2017-11-02 NOTE — Progress Notes (Signed)
Post-Op Visit Note   Patient: Lee Lee           Date of Birth: [birth year]           MRN: [id number] Visit Date: 11/02/2017 PCP: Janith Lima, MD   Assessment & Plan: Patient 7 weeks post quad tendon repair right leg.  Down he can discontinue knee knee immobilizer resume his strengthening activities knee range of motion.  He lacks 5 degrees symmetrical flexion with his opposite normal knee.  Still amatory with a slight weak quad gait with some hip flexion at heel plant and mid stance phase.  Will he will continue to work riding the exercise bike, elliptical exercises which we went over.  Resistive exercises over the next month.  Chief Complaint:  Chief Complaint  Patient presents with  . Right Knee - Follow-up   Visit Diagnoses:  1. Rupture of right quadriceps tendon, subsequent encounter     Plan: Discontinue knee immobilizer.  Resume resisted and range of motion exercises.    Follow-Up Instructions: Return if symptoms worsen or fail to improve.   Orders:  No orders of the defined types were placed in this encounter.  No orders of the defined types were placed in this encounter.   Imaging: No results found.  PMFS History: Patient Active Problem List   Diagnosis Date Noted  . Rupture of right quadriceps tendon 10/18/2017  . Insomnia due to anxiety and fear 09/30/2016  . Restless leg syndrome, familial 09/30/2016  . Iron deficiency anemia 09/30/2016  . Hypertriglyceridemia 06/15/2016  . Hyperlipidemia with target LDL less than 130 06/15/2016  . Hyperglycemia 06/15/2016  . S/P radiofrequency ablation operation for arrhythmia 07/04/15 07/05/2015  . SVT (supraventricular tachycardia) ([place]) 07/04/2015  . Melanoma of skin ([place]) 04/16/2015  . Atrial flutter ([place]) 12/07/2012  . Essential hypertension 11/07/2012  . Ascending aortic aneurysm ([place]) 11/07/2012  . ED (erectile dysfunction) 11/15/2011   Past Medical History:  Diagnosis Date  . Alcohol abuse, episodic  drinking behavior   . Arthritis    back & knees  . Atrial flutter ([place])    s/p RFCA 01/05/13  . Calcium oxalate renal stones   . Complication of anesthesia    makes him loopy  . ED (erectile dysfunction)   . Hemorrhoids   . Hypertension     Family History  Problem Relation Age of Onset  . Valvular heart disease Mother   . Lymphoma Mother     Past Surgical History:  Procedure Laterality Date  . ABLATION OF DYSRHYTHMIC FOCUS  01/05/2013  . ARTHROSCOPIC REPAIR ACL    . ATRIAL FLUTTER ABLATION N/A 01/05/2013   Procedure: ATRIAL FLUTTER ABLATION;  Surgeon: Evans Lance, MD;  [facility] CATH LAB;  Service: Cardiovascular;  Laterality: N/A;  . COLONOSCOPY  11/2009   Dr. Benson Norway  . ELECTROPHYSIOLOGIC STUDY N/A 07/04/2015   Procedure: A-Flutter Ablation;  Surgeon: Evans Lance, MD;  Location: [place] CV LAB;  Service: Cardiovascular;  Laterality: N/A;  . REPLACEMENT TOTAL KNEE Right 09/12/2017   by dr Lorin Mercy at [facility]  . ROTATOR CUFF REPAIR     Social History   Occupational History  . Not on file  Tobacco Use  . Smoking status: Former Smoker    Last attempt to quit: 01/06/1988    Years since quitting: 29.8  . Smokeless tobacco: Current User    Types: Chew  . Tobacco comment: chews tobacco when playing golf only  Substance and Sexual Activity  .  Alcohol use: Yes    Alcohol/week: 3.0 oz    Types: 5 Glasses of wine per week    Comment: wine daily  . Drug use: No  . Sexual activity: Yes

## 2017-11-04 ENCOUNTER — Encounter: Payer: BLUE CROSS/BLUE SHIELD | Admitting: Physical Therapy

## 2017-11-07 ENCOUNTER — Encounter: Payer: BLUE CROSS/BLUE SHIELD | Admitting: Physical Therapy

## 2017-11-09 ENCOUNTER — Ambulatory Visit: Payer: BLUE CROSS/BLUE SHIELD | Attending: Orthopaedic Surgery | Admitting: Physical Therapy

## 2017-11-09 ENCOUNTER — Encounter: Payer: Self-pay | Admitting: Physical Therapy

## 2017-11-09 DIAGNOSIS — R2689 Other abnormalities of gait and mobility: Secondary | ICD-10-CM

## 2017-11-09 DIAGNOSIS — M25561 Pain in right knee: Secondary | ICD-10-CM | POA: Diagnosis not present

## 2017-11-09 DIAGNOSIS — M6281 Muscle weakness (generalized): Secondary | ICD-10-CM | POA: Insufficient documentation

## 2017-11-09 DIAGNOSIS — R600 Localized edema: Secondary | ICD-10-CM | POA: Diagnosis present

## 2017-11-09 DIAGNOSIS — M25661 Stiffness of right knee, not elsewhere classified: Secondary | ICD-10-CM | POA: Diagnosis present

## 2017-11-09 NOTE — Patient Instructions (Addendum)
Strengthening: Hip Abduction (Side-Lying)    Tighten muscles on front of left thigh, then lift leg __6__ inches from surface, keeping knee locked.  Repeat ___10_ times per set. Do __2__ sets per session. Do ___1_ sessions per day.  http://orth.exer.us/622   Copyright  VHI. All rights reserved.  Strengthening: Straight Leg Raise (Phase 1)    Tighten muscles on front of right thigh, then lift leg __6__ inches from surface, keeping knee locked.  Repeat _10 slow/10 fast___ times per set. Do __2__ sets per session. Do __1__ sessions per day.  http://orth.exer.us/614   Copyright  VHI. All rights reserved.  Bracing With March in Bridging (Hook-Lying)    With neutral spine, tighten pelvic floor and abdominals and hold. Lift bottom and hold, then march in place. March _10__ times. Do __1_ times a day.   Copyright  VHI. All rights reserved.  Wall Sit    Back against wall, slide down so knees are at 45 angle. Hold _2___ seconds. Do __5__ sets. Complete __1__ repetitions. Mostly push with right leg http://st.exer.us/294   Copyright  VHI. All rights reserved.  Ankle: Calf Raise    Stand with forefoot on step, heels toward floor. Raise heels as far as possible. Go down with right heel. Use hand support for balance. Repeat __10__ times per set. Do __2__ sets per session. Do _1___ sessions per week.  Copyright  VHI. All rights reserved.  [place] [address], [place], [place] [address] Phone # [phone] Fax [phone]

## 2017-11-09 NOTE — Therapy (Signed)
[facility] [address], [place], [place], [address] Phone: [phone]   Fax:  [phone]  Physical Therapy Treatment  Patient Details  Name: Lee Lee MRN: [id number] Date of Birth: 11-15-1951 Referring Provider: Dr. Rodell Perna   Encounter Date: 11/09/2017  PT End of Session - 11/09/17 1449    Visit Number  11    Date for PT Re-Evaluation  01/11/18    Authorization Type  [employer]    PT Start Time  1400    PT Stop Time  1440    PT Time Calculation (min)  40 min    Activity Tolerance  Patient tolerated treatment well;No increased pain    Behavior During Therapy  WFL for tasks assessed/performed       Past Medical History:  Diagnosis Date  . Alcohol abuse, episodic drinking behavior   . Arthritis    back & knees  . Atrial flutter ([place])    s/p RFCA 01/05/13  . Calcium oxalate renal stones   . Complication of anesthesia    makes him loopy  . ED (erectile dysfunction)   . Hemorrhoids   . Hypertension     Past Surgical History:  Procedure Laterality Date  . ABLATION OF DYSRHYTHMIC FOCUS  01/05/2013  . ARTHROSCOPIC REPAIR ACL    . ATRIAL FLUTTER ABLATION N/A 01/05/2013   Procedure: ATRIAL FLUTTER ABLATION;  Surgeon: Evans Lance, MD;  Location: [facility] CATH LAB;  Service: Cardiovascular;  Laterality: N/A;  . COLONOSCOPY  11/2009   Dr. Benson Norway  . ELECTROPHYSIOLOGIC STUDY N/A 07/04/2015   Procedure: A-Flutter Ablation;  Surgeon: Evans Lance, MD;  Location: [place] CV LAB;  Service: Cardiovascular;  Laterality: N/A;  . REPLACEMENT TOTAL KNEE Right 09/12/2017   by dr Lorin Mercy at [facility]  . ROTATOR CUFF REPAIR      There were no vitals filed for this visit.  Subjective Assessment - 11/09/17 1410    Subjective  the doctor had me stop therapy for 2 weeks and wear my knee immobilizer due to progreesing to quickly.  MD put in his reports he is to do the bike, elliptical, Knee ROM and resistance exercises.   My knee give out due to the right quad weakness.     Pertinent History  Rt quad tendon repair: 09/12/17.  Quad isometrics and knee PROM per MD    Limitations  Standing;Walking    How long can you stand comfortably?  30 minutes    How long can you walk comfortably?  30 minutes    Patient Stated Goals  increase Rt knee A/ROM, improve strength, return to walking without limitation    Currently in Pain?  No/denies    Multiple Pain Sites  No         [facility] PT Assessment - 11/09/17 0001      Assessment   Medical Diagnosis  Rt quad tendon rupture    Referring Provider  Dr. Rodell Perna    Onset Date/Surgical Date  09/08/17    Prior Therapy  none      Precautions   Precautions  Other (comment)    Precaution Comments  right knee, progress slowly with Knee ROM and resistance exercises      Restrictions   Weight Bearing Restrictions  No      Home Environment   Living Environment  Private residence    Type of [place] to enter    Home  Layout  Two level    Alternate Level Stairs-Number of Steps  step to step especially going down       Prior Function   Level of Independence  Independent      Cognition   Overall Cognitive Status  Within Functional Limits for tasks assessed      Observation/Other Assessments   Focus on Therapeutic Outcomes (FOTO)   48% limited      Posture/Postural Control   Posture/Postural Control  No significant limitations      ROM / Strength   AROM / PROM / Strength  AROM;PROM;Strength      AROM   AROM Assessment Site  Knee    Right/Left Knee  Right    Right Knee Extension  -15 sitting    Right Knee Flexion  108 sitting      PROM   Right Knee Extension  -5    Right Knee Flexion  118      Strength   Right Hip Flexion  3+/5    Right Hip Extension  4+/5    Right Hip ABduction  4+/5    Right/Left Knee  Right    Right Knee Flexion  4-/5    Right Knee Extension  3+/5    Right Ankle Plantar Flexion  3/5    Right Ankle Inversion   4/5                  [facility] Adult PT Treatment/Exercise - 11/09/17 0001      Knee/Hip Exercises: Aerobic   Nustep  8 min level 2      Knee/Hip Exercises: Seated   Long Arc Quad  Right;2 sets;10 reps    Long Arc Quad Weight  2 lbs.      Knee/Hip Exercises: Sidelying   Hip ABduction  Strengthening;Right;2 sets;10 reps tactile cues to prevent flexion      Knee/Hip Exercises: Prone   Hamstring Curl  1 set;15 reps 2#             PT Education - 11/09/17 1448    Education provided  Yes    Education Details  knee and hip strength    Person(s) Educated  Patient    Methods  Explanation;Demonstration;Verbal cues;Handout    Comprehension  Returned demonstration;Verbalized understanding       PT Short Term Goals - 10/03/17 0914      PT SHORT TERM GOAL #3   Title  demonstrate good quad set on the Rt to improve Rt LE stability with gait    Time  4    Period  Weeks    Status  Achieved        PT Long Term Goals - 11/09/17 1501      PT LONG TERM GOAL #1   Title  be independent in advanced HEP    Time  8    Period  Weeks    Status  On-going      PT LONG TERM GOAL #2   Title  reduce FOTO to < or = to 35% limitation    Time  8    Period  Weeks    Status  New      PT LONG TERM GOAL #3   Title  demonstrate Rt knee P/ROM flexion to > or = to 95 degrees to allow for sitting without substitution    Time  8    Period  Weeks    Status  Achieved      PT LONG  TERM GOAL #4   Title  demonstrate 4+/5 Rt quad strength to improve stabilization with gait    Time  8    Period  Weeks    Status  Revised      PT LONG TERM GOAL #5   Title  report < or = to 2/10 Rt LE strength with standing and walking    Time  8    Period  Weeks    Status  Achieved      Additional Long Term Goals   Additional Long Term Goals  Yes      PT LONG TERM GOAL #6   Title  ambulate without his right knee not giving way due to strength >/= 4/5 and minimal to no limp    Time  8    Period   Weeks    Status  New    Target Date  01/11/18      PT LONG TERM GOAL #7   Title  abilty to go up and down steps with a step over step pattern due to improve eccentric contraction of the right quadriceps     Time  8    Period  Weeks    Target Date  01/11/18            Plan - 11/09/17 1449    Clinical Impression Statement  Patient has been off from physical therapy for 2 weeks as per MD orders to be in a knee immobilizer.  Patient was progressing too quickly.  Right knee PROM is -5-118 and AROM -15-108. Patient has weakness in right knee, right ankle plantarflexion and inversion, and right hip flexion, abduction and extension.  Patient walks with an antalgic gait due to weakness in right knee.  Patient goes up and down steps with a step to step pattern due to weakness in right knee.  Patient reports no pain just soreness in his muscles of the right quads. Patient has good right  patella movement.  Patient is able to contract the right quadriceps correctly.  Patient reports his right leg will give way  while walking but he has not fallen. Patient will benefit from skilled therapy to improve right knee strength  to improve gait and funcitonal mobility.     Clinical Impairments Affecting Rehab Potential  Right quad tendon repair 09/12/2017; ROM and resistance exercises to right knee    PT Frequency  2x / week    PT Duration  8 weeks    PT Treatment/Interventions  ADLs/Self Care Home Management;Cryotherapy;Electrical Stimulation;Functional mobility training;Stair training;Moist Heat;Therapeutic activities;Therapeutic exercise;Neuromuscular re-education;Passive range of motion;Scar mobilization;Manual techniques;Taping;Vasopneumatic Device    PT Next Visit Plan  nu step; elliptical; right knee ROM and resistive ex.; right ankle plantarflexor and inverters; right hip flexion, extension and abduction strength    PT Home Exercise Plan  progress as needed    Recommended Other Services  renewal note  sent to MD on 11/09/2017       Patient will benefit from skilled therapeutic intervention in order to improve the following deficits and impairments:  Abnormal gait, Difficulty walking, Pain, Decreased endurance, Decreased activity tolerance, Decreased range of motion, Increased edema, Decreased mobility  Visit Diagnosis: Acute pain of right knee - Plan: PT plan of care cert/re-cert  Stiffness of right knee, not elsewhere classified - Plan: PT plan of care cert/re-cert  Other abnormalities of gait and mobility - Plan: PT plan of care cert/re-cert  Localized edema - Plan: PT plan of care cert/re-cert  Muscle weakness (  generalized) - Plan: PT plan of care cert/re-cert     Problem List Patient Active Problem List   Diagnosis Date Noted  . Rupture of right quadriceps tendon 10/18/2017  . Insomnia due to anxiety and fear 09/30/2016  . Restless leg syndrome, familial 09/30/2016  . Iron deficiency anemia 09/30/2016  . Hypertriglyceridemia 06/15/2016  . Hyperlipidemia with target LDL less than 130 06/15/2016  . Hyperglycemia 06/15/2016  . S/P radiofrequency ablation operation for arrhythmia 07/04/15 07/05/2015  . SVT (supraventricular tachycardia) ([place]) 07/04/2015  . Melanoma of skin ([place]) 04/16/2015  . Atrial flutter ([name]) 12/07/2012  . Essential hypertension 11/07/2012  . Ascending aortic aneurysm ([place]) 11/07/2012  . ED (erectile dysfunction) 11/15/2011    Earlie Counts, PT 11/09/17 3:08 PM   [facility] [address] [place], [place], [address] Phone: [phone]   Fax:  [phone]  Name: Lee Lee MRN: [id number] Date of Birth: 1951-09-09

## 2017-11-11 ENCOUNTER — Encounter: Payer: Self-pay | Admitting: Physical Therapy

## 2017-11-11 ENCOUNTER — Ambulatory Visit: Payer: BLUE CROSS/BLUE SHIELD | Admitting: Physical Therapy

## 2017-11-11 DIAGNOSIS — R2689 Other abnormalities of gait and mobility: Secondary | ICD-10-CM

## 2017-11-11 DIAGNOSIS — M25561 Pain in right knee: Secondary | ICD-10-CM | POA: Diagnosis not present

## 2017-11-11 DIAGNOSIS — M25661 Stiffness of right knee, not elsewhere classified: Secondary | ICD-10-CM

## 2017-11-11 DIAGNOSIS — M6281 Muscle weakness (generalized): Secondary | ICD-10-CM

## 2017-11-11 DIAGNOSIS — R600 Localized edema: Secondary | ICD-10-CM

## 2017-11-11 NOTE — Therapy (Signed)
[facility] [address] [place], [place], [address] Phone: [phone]   Fax:  [phone]  Physical Therapy Treatment  Patient Details  Name: Lee Lee MRN: [id number] Date of Birth: Sep 26, 1951 Referring Provider: Dr. Rodell Perna   Encounter Date: 11/11/2017  PT End of Session - 11/11/17 1022    Visit Number  12    Date for PT Re-Evaluation  01/11/18    Authorization Type  BCBS    PT Start Time  1015    PT Stop Time  1110    PT Time Calculation (min)  55 min    Activity Tolerance  Patient tolerated treatment well;No increased pain    Behavior During Therapy  WFL for tasks assessed/performed       Past Medical History:  Diagnosis Date  . Alcohol abuse, episodic drinking behavior   . Arthritis    back & knees  . Atrial flutter ([place])    s/p RFCA 01/05/13  . Calcium oxalate renal stones   . Complication of anesthesia    makes him loopy  . ED (erectile dysfunction)   . Hemorrhoids   . Hypertension     Past Surgical History:  Procedure Laterality Date  . ABLATION OF DYSRHYTHMIC FOCUS  01/05/2013  . ARTHROSCOPIC REPAIR ACL    . ATRIAL FLUTTER ABLATION N/A 01/05/2013   Procedure: ATRIAL FLUTTER ABLATION;  Surgeon: Evans Lance, MD;  Location: [facility] CATH LAB;  Service: Cardiovascular;  Laterality: N/A;  . COLONOSCOPY  11/2009   Dr. Benson Norway  . ELECTROPHYSIOLOGIC STUDY N/A 07/04/2015   Procedure: A-Flutter Ablation;  Surgeon: Evans Lance, MD;  Location: [facility];  Service: Cardiovascular;  Laterality: N/A;  . REPLACEMENT TOTAL KNEE Right 09/12/2017   by dr Lorin Mercy at [facility]  . ROTATOR CUFF REPAIR      There were no vitals filed for this visit.  Subjective Assessment - 11/11/17 1023    Subjective  Pt frustrated, wants his legs to feel better, reports he is not patient. Rt knee still buckles at times.    Currently in Pain?  No/denies    Pain Location  Knee    Pain Descriptors / Indicators   Tightness         [facility] PT Assessment - 11/11/17 0001      AROM   Right Knee Flexion  120 supine                  [facility] Adult PT Treatment/Exercise - 11/11/17 0001      Knee/Hip Exercises: Aerobic   Recumbent Bike  L2 x 6 min PTA present for status      Knee/Hip Exercises: Standing   Forward Step Up  Right;2 sets;10 reps;Hand Hold: 2;Step Height: 6" UE support lessend     Rebounder  wt shifting 3 ways 1 min each    Walking with Sports Cord  25# 10x forward and back    Other Standing Knee Exercises  hip kicks 3 ways alt 2x10 0#  VC for technique      Knee/Hip Exercises: Supine   Short Arc Quad Sets  -- 3 sec hold 5x 2 0#      Knee/Hip Exercises: Sidelying   Hip ABduction  Strengthening;Right;2 sets;10 reps      Vasopneumatic   Number Minutes Vasopneumatic   15 minutes    Vasopnuematic Location   Knee    Vasopneumatic Pressure  Medium    Vasopneumatic Temperature   3  flakes               PT Short Term Goals - 10/03/17 0914      PT SHORT TERM GOAL #3   Title  demonstrate good quad set on the Rt to improve Rt LE stability with gait    Time  4    Period  Weeks    Status  Achieved        PT Long Term Goals - 11/09/17 1501      PT LONG TERM GOAL #1   Title  be independent in advanced HEP    Time  8    Period  Weeks    Status  On-going      PT LONG TERM GOAL #2   Title  reduce FOTO to < or = to 35% limitation    Time  8    Period  Weeks    Status  New      PT LONG TERM GOAL #3   Title  demonstrate Rt knee P/ROM flexion to > or = to 95 degrees to allow for sitting without substitution    Time  8    Period  Weeks    Status  Achieved      PT LONG TERM GOAL #4   Title  demonstrate 4+/5 Rt quad strength to improve stabilization with gait    Time  8    Period  Weeks    Status  Revised      PT LONG TERM GOAL #5   Title  report < or = to 2/10 Rt LE strength with standing and walking    Time  8    Period  Weeks    Status  Achieved       Additional Long Term Goals   Additional Long Term Goals  Yes      PT LONG TERM GOAL #6   Title  ambulate without his right knee not giving way due to strength >/= 4/5 and minimal to no limp    Time  8    Period  Weeks    Status  New    Target Date  01/11/18      PT LONG TERM GOAL #7   Title  abilty to go up and down steps with a step over step pattern due to improve eccentric contraction of the right quadriceps     Time  8    Period  Weeks    Target Date  01/11/18            Plan - 11/11/17 1022    Clinical Impression Statement  Pt presents with reported stiffness in his knee but no pain. He does report quad soreness after last session but today this is much better. He was able to perform mostly closed chain strengthening exercises wihtout increasing any pain. 6 inch step ups most difficult as were SAQ near the end of his session when he wsa most fatigued. AROM back to 120 degrees with ease. Knee does have mild-mod edema, used Game Ready for swelling at end of session.     Rehab Potential  Good    Clinical Impairments Affecting Rehab Potential  Right quad tendon repair 09/12/2017; ROM and resistance exercises to right knee    PT Frequency  2x / week    PT Duration  8 weeks    PT Treatment/Interventions  ADLs/Self Care Home Management;Cryotherapy;Electrical Stimulation;Functional mobility training;Stair training;Moist Heat;Therapeutic activities;Therapeutic exercise;Neuromuscular re-education;Passive range of motion;Scar mobilization;Manual techniques;Taping;Vasopneumatic Device  PT Next Visit Plan  Knee and hip strength    Consulted and Agree with Plan of Care  Patient       Patient will benefit from skilled therapeutic intervention in order to improve the following deficits and impairments:  Abnormal gait, Difficulty walking, Pain, Decreased endurance, Decreased activity tolerance, Decreased range of motion, Increased edema, Decreased mobility  Visit Diagnosis: Acute pain of  right knee  Stiffness of right knee, not elsewhere classified  Other abnormalities of gait and mobility  Localized edema  Muscle weakness (generalized)     Problem List Patient Active Problem List   Diagnosis Date Noted  . Rupture of right quadriceps tendon 10/18/2017  . Insomnia due to anxiety and fear 09/30/2016  . Restless leg syndrome, familial 09/30/2016  . Iron deficiency anemia 09/30/2016  . Hypertriglyceridemia 06/15/2016  . Hyperlipidemia with target LDL less than 130 06/15/2016  . Hyperglycemia 06/15/2016  . S/P radiofrequency ablation operation for arrhythmia 07/04/15 07/05/2015  . SVT (supraventricular tachycardia) ([place]) 07/04/2015  . Melanoma of skin ([place]) 04/16/2015  . Atrial flutter ([place]) 12/07/2012  . Essential hypertension 11/07/2012  . Ascending aortic aneurysm ([place]) 11/07/2012  . ED (erectile dysfunction) 11/15/2011    Pistol Kessenich, PTA 11/11/2017, 10:59 AM  [facility] [address] [place], [place], [address] Phone: [phone]   Fax:  [phone]  Name: Lee Lee MRN: [id number] Date of Birth: 11/28/1951

## 2017-11-14 ENCOUNTER — Encounter: Payer: Self-pay | Admitting: Physical Therapy

## 2017-11-14 ENCOUNTER — Ambulatory Visit: Payer: BLUE CROSS/BLUE SHIELD | Admitting: Physical Therapy

## 2017-11-14 DIAGNOSIS — M25661 Stiffness of right knee, not elsewhere classified: Secondary | ICD-10-CM

## 2017-11-14 DIAGNOSIS — R600 Localized edema: Secondary | ICD-10-CM

## 2017-11-14 DIAGNOSIS — M25561 Pain in right knee: Secondary | ICD-10-CM

## 2017-11-14 DIAGNOSIS — R2689 Other abnormalities of gait and mobility: Secondary | ICD-10-CM

## 2017-11-14 DIAGNOSIS — M6281 Muscle weakness (generalized): Secondary | ICD-10-CM

## 2017-11-14 NOTE — Therapy (Signed)
[facility] [address] [place], [place], [address] Phone: [phone]   Fax:  [phone]  Physical Therapy Treatment  Patient Details  Name: Lee Lee MRN: [id number] Date of Birth: September 10, 1951 Referring Provider: Dr. Rodell Perna   Encounter Date: 11/14/2017  PT End of Session - 11/14/17 1019    Visit Number  13    Date for PT Re-Evaluation  01/11/18    Authorization Type  BCBS    PT Start Time  1015    PT Stop Time  1115    PT Time Calculation (min)  60 min    Activity Tolerance  Patient tolerated treatment well;No increased pain    Behavior During Therapy  WFL for tasks assessed/performed       Past Medical History:  Diagnosis Date  . Alcohol abuse, episodic drinking behavior   . Arthritis    back & knees  . Atrial flutter ([place])    s/p RFCA 01/05/13  . Calcium oxalate renal stones   . Complication of anesthesia    makes him loopy  . ED (erectile dysfunction)   . Hemorrhoids   . Hypertension     Past Surgical History:  Procedure Laterality Date  . ABLATION OF DYSRHYTHMIC FOCUS  01/05/2013  . ARTHROSCOPIC REPAIR ACL    . ATRIAL FLUTTER ABLATION N/A 01/05/2013   Procedure: ATRIAL FLUTTER ABLATION;  Surgeon: Evans Lance, MD;  Location: [facility] CATH LAB;  Service: Cardiovascular;  Laterality: N/A;  . COLONOSCOPY  11/2009   Dr. Benson Norway  . ELECTROPHYSIOLOGIC STUDY N/A 07/04/2015   Procedure: A-Flutter Ablation;  Surgeon: Evans Lance, MD;  Location: [facility];  Service: Cardiovascular;  Laterality: N/A;  . REPLACEMENT TOTAL KNEE Right 09/12/2017   by dr Lorin Mercy at [facility]  . ROTATOR CUFF REPAIR      There were no vitals filed for this visit.  Subjective Assessment - 11/14/17 1021    Subjective  Riding bike at gym for 20 min. Reports knee feels good, but his muscles are pretty sore.     Currently in Pain?  -- Only muscular soreness    Multiple Pain Sites  No                       [facility] Adult PT Treatment/Exercise - 11/14/17 0001      Knee/Hip Exercises: Stretches   Active Hamstring Stretch  Both;3 reps;20 seconds    Gastroc Stretch  Both;3 reps;20 seconds standing slant board      Knee/Hip Exercises: Aerobic   Recumbent Bike  L2 x 6 min PTA present for status      Knee/Hip Exercises: Machines for Strengthening   Total Gym Leg Press  Seat 8 RTLE 35# 2x10      Knee/Hip Exercises: Standing   Rebounder  wt shifting 3 ways 1 min each    Walking with Sports Cord  25# 10x forward and back 20# side steps bil 6x      Knee/Hip Exercises: Seated   Long Arc Quad  Strengthening;Right;2 sets;10 reps;Weights 3 sec hold, VC to DF ankle    Long Arc Quad Weight  2 lbs.      Moist Heat Therapy   Number Minutes Moist Heat  15 Minutes    Moist Heat Location  -- RT quad, knee, calf for post ex soreness             PT Education - 11/14/17 1057  Education provided  Yes    Education Details  [employer]astroc and Regulatory affairs officer for HEP    Person(s) Educated  Patient    Methods  Explanation;Demonstration;Tactile cues;Verbal cues    Comprehension  Returned demonstration;Verbalized understanding       PT Short Term Goals - 10/03/17 0914      PT SHORT TERM GOAL #3   Title  demonstrate good quad set on the Rt to improve Rt LE stability with gait    Time  4    Period  Weeks    Status  Achieved        PT Long Term Goals - 11/09/17 1501      PT LONG TERM GOAL #1   Title  be independent in advanced HEP    Time  8    Period  Weeks    Status  On-going      PT LONG TERM GOAL #2   Title  reduce FOTO to < or = to 35% limitation    Time  8    Period  Weeks    Status  New      PT LONG TERM GOAL #3   Title  demonstrate Rt knee P/ROM flexion to > or = to 95 degrees to allow for sitting without substitution    Time  8    Period  Weeks    Status  Achieved      PT LONG TERM GOAL #4   Title  demonstrate 4+/5 Rt quad strength to  improve stabilization with gait    Time  8    Period  Weeks    Status  Revised      PT LONG TERM GOAL #5   Title  report < or = to 2/10 Rt LE strength with standing and walking    Time  8    Period  Weeks    Status  Achieved      Additional Long Term Goals   Additional Long Term Goals  Yes      PT LONG TERM GOAL #6   Title  ambulate without his right knee not giving way due to strength >/= 4/5 and minimal to no limp    Time  8    Period  Weeks    Status  New    Target Date  01/11/18      PT LONG TERM GOAL #7   Title  abilty to go up and down steps with a step over step pattern due to improve eccentric contraction of the right quadriceps     Time  8    Period  Weeks    Target Date  01/11/18            Plan - 11/14/17 1020    Clinical Impression Statement  Pt's gait normalizing, essentially no limp today. He has no knee pain, but his muscles are pretty sore from exercising. Pt performed closed chain and open chain strengthening exercises with visable shaking due to his weakness. Pt able to maintain good contol.  No Game Ready needed as his swelling was mild.     Rehab Potential  Good    Clinical Impairments Affecting Rehab Potential  Right quad tendon repair 09/12/2017; ROM and resistance exercises to right knee    PT Frequency  2x / week    PT Duration  8 weeks    PT Treatment/Interventions  ADLs/Self Care Home Management;Cryotherapy;Electrical Stimulation;Functional mobility training;Stair training;Moist Heat;Therapeutic activities;Therapeutic exercise;Neuromuscular re-education;Passive range of motion;Scar mobilization;Manual  techniques;Taping;Vasopneumatic Device    PT Next Visit Plan  Knee and hip strength    PT Home Exercise Plan  progress as needed    Consulted and Agree with Plan of Care  Patient       Patient will benefit from skilled therapeutic intervention in order to improve the following deficits and impairments:  Abnormal gait, Difficulty walking, Pain,  Decreased endurance, Decreased activity tolerance, Decreased range of motion, Increased edema, Decreased mobility  Visit Diagnosis: Acute pain of right knee  Stiffness of right knee, not elsewhere classified  Localized edema  Other abnormalities of gait and mobility  Muscle weakness (generalized)     Problem List Patient Active Problem List   Diagnosis Date Noted  . Rupture of right quadriceps tendon 10/18/2017  . Insomnia due to anxiety and fear 09/30/2016  . Restless leg syndrome, familial 09/30/2016  . Iron deficiency anemia 09/30/2016  . Hypertriglyceridemia 06/15/2016  . Hyperlipidemia with target LDL less than 130 06/15/2016  . Hyperglycemia 06/15/2016  . S/P radiofrequency ablation operation for arrhythmia 07/04/15 07/05/2015  . SVT (supraventricular tachycardia) ([place]) 07/04/2015  . Melanoma of skin ([place]) 04/16/2015  . Atrial flutter ([place]) 12/07/2012  . Essential hypertension 11/07/2012  . Ascending aortic aneurysm ([place]) 11/07/2012  . ED (erectile dysfunction) 11/15/2011    Pankaj Haack, PTA 11/14/2017, 10:58 AM  [facility] [address], [place], [place], [address] Phone: [phone]   Fax:  [phone]  Name: Lee Lee MRN: [id number] Date of Birth: 1951-07-18

## 2017-11-16 ENCOUNTER — Ambulatory Visit: Payer: BLUE CROSS/BLUE SHIELD

## 2017-11-16 DIAGNOSIS — M25661 Stiffness of right knee, not elsewhere classified: Secondary | ICD-10-CM

## 2017-11-16 DIAGNOSIS — M6281 Muscle weakness (generalized): Secondary | ICD-10-CM

## 2017-11-16 DIAGNOSIS — R2689 Other abnormalities of gait and mobility: Secondary | ICD-10-CM

## 2017-11-16 DIAGNOSIS — M25561 Pain in right knee: Secondary | ICD-10-CM

## 2017-11-16 DIAGNOSIS — R600 Localized edema: Secondary | ICD-10-CM

## 2017-11-16 NOTE — Therapy (Signed)
[facility] [address] [place], [place], [address] Phone: [phone]   Fax:  [phone]  Physical Therapy Treatment  Patient Details  Name: Lee Lee MRN: [id number] Date of Birth: 06-20-1951 Referring Provider: Dr. Rodell Perna   Encounter Date: 11/16/2017  PT End of Session - 11/16/17 0925    Visit Number  14    Date for PT Re-Evaluation  01/11/18    PT Start Time  0846    PT Stop Time  0939    PT Time Calculation (min)  53 min    Activity Tolerance  Patient tolerated treatment well    Behavior During Therapy  [facility] for tasks assessed/performed       Past Medical History:  Diagnosis Date  . Alcohol abuse, episodic drinking behavior   . Arthritis    back & knees  . Atrial flutter ([place])    s/p RFCA 01/05/13  . Calcium oxalate renal stones   . Complication of anesthesia    makes him loopy  . ED (erectile dysfunction)   . Hemorrhoids   . Hypertension     Past Surgical History:  Procedure Laterality Date  . ABLATION OF DYSRHYTHMIC FOCUS  01/05/2013  . ARTHROSCOPIC REPAIR ACL    . ATRIAL FLUTTER ABLATION N/A 01/05/2013   Procedure: ATRIAL FLUTTER ABLATION;  Surgeon: Evans Lance, MD;  Location: [facility] CATH LAB;  Service: Cardiovascular;  Laterality: N/A;  . COLONOSCOPY  11/2009   Dr. Benson Norway  . ELECTROPHYSIOLOGIC STUDY N/A 07/04/2015   Procedure: A-Flutter Ablation;  Surgeon: Evans Lance, MD;  Location: [facility];  Service: Cardiovascular;  Laterality: N/A;  . REPLACEMENT TOTAL KNEE Right 09/12/2017   by dr Lorin Mercy at [facility]  . ROTATOR CUFF REPAIR      There were no vitals filed for this visit.  Subjective Assessment - 11/16/17 0854    Subjective  I was muscle sore after last session but no pain.      Pertinent History  Rt quad tendon repair: 09/12/17.      Patient Stated Goals  increase Rt knee A/ROM, improve strength, return to walking without limitation    Currently in Pain?   No/denies                      [facility] Adult PT Treatment/Exercise - 11/16/17 0001      Knee/Hip Exercises: Stretches   Active Hamstring Stretch  Both;3 reps;20 seconds    Gastroc Stretch  Both;3 reps;20 seconds standing slant board      Knee/Hip Exercises: Aerobic   Recumbent Bike  L2 x 6 min PTA present for status      Knee/Hip Exercises: Machines for Strengthening   Total Gym Leg Press  Seat 8 RTLE 35# 3x10      Knee/Hip Exercises: Standing   Forward Step Up  Right;2 sets;10 reps;Hand Hold: 2;Step Height: 6" UE support lessend     SLS  10 second hold x 5 min UE support    Rebounder  wt shifting 3 ways 1 min each    Walking with Sports Cord  25# 10x forward and back 20# side steps bil 6x      Knee/Hip Exercises: Seated   Long Arc Quad  Strengthening;Right;2 sets;10 reps;Weights 3 sec hold, VC to DF ankle    Long Arc Quad Weight  2 lbs.      Knee/Hip Exercises: Supine   Straight Leg Raises  Strengthening;Right;2 sets;10 reps  difficulty keeping knee straight      Moist Heat Therapy   Number Minutes Moist Heat  12 Minutes    Moist Heat Location  -- RT quad, knee, calf for post ex soreness               PT Short Term Goals - 10/03/17 0914      PT SHORT TERM GOAL #3   Title  demonstrate good quad set on the Rt to improve Rt LE stability with gait    Time  4    Period  Weeks    Status  Achieved        PT Long Term Goals - 11/16/17 9373      PT LONG TERM GOAL #1   Title  be independent in advanced HEP    Time  8    Period  Weeks    Status  On-going      PT LONG TERM GOAL #2   Title  reduce FOTO to < or = to 35% limitation    Time  8    Period  Weeks    Status  On-going      PT LONG TERM GOAL #4   Title  demonstrate 4+/5 Rt quad strength to improve stabilization with gait    Time  8    Period  Weeks    Status  On-going            Plan - 11/16/17 0909    Clinical Impression Statement  Pt with improving gait pattern with minimal to  no antalgia today.  Pt demonstrates quad instability with single leg activity and step-ups today.  Verbal cues from PT to avoid hyperextension of the Rt knee.  Pt will continue to benefit from skilled PT for Rt LE strength, flexibility, endurance and gait to improve independence and safety in the community.      Clinical Impairments Affecting Rehab Potential  Right quad tendon repair 09/12/2017; ROM and resistance exercises to right knee    PT Frequency  2x / week    PT Duration  8 weeks    PT Treatment/Interventions  ADLs/Self Care Home Management;Cryotherapy;Electrical Stimulation;Functional mobility training;Stair training;Moist Heat;Therapeutic activities;Therapeutic exercise;Neuromuscular re-education;Passive range of motion;Scar mobilization;Manual techniques;Taping;Vasopneumatic Device    PT Next Visit Plan  Knee and hip strength, proprioception, flexibility    Recommended Other Services  certification and recertification are signed    Consulted and Agree with Plan of Care  Patient       Patient will benefit from skilled therapeutic intervention in order to improve the following deficits and impairments:  Abnormal gait, Difficulty walking, Pain, Decreased endurance, Decreased activity tolerance, Decreased range of motion, Increased edema, Decreased mobility  Visit Diagnosis: Acute pain of right knee  Stiffness of right knee, not elsewhere classified  Localized edema  Other abnormalities of gait and mobility  Muscle weakness (generalized)     Problem List Patient Active Problem List   Diagnosis Date Noted  . Rupture of right quadriceps tendon 10/18/2017  . Insomnia due to anxiety and fear 09/30/2016  . Restless leg syndrome, familial 09/30/2016  . Iron deficiency anemia 09/30/2016  . Hypertriglyceridemia 06/15/2016  . Hyperlipidemia with target LDL less than 130 06/15/2016  . Hyperglycemia 06/15/2016  . S/P radiofrequency ablation operation for arrhythmia 07/04/15 07/05/2015   . SVT (supraventricular tachycardia) ([place]) 07/04/2015  . Melanoma of skin ([place]) 04/16/2015  . Atrial flutter ([place]) 12/07/2012  . Essential hypertension 11/07/2012  . Ascending aortic aneurysm ([place]) 11/07/2012  .  ED (erectile dysfunction) 11/15/2011     Sigurd Sos, PT 11/16/17 9:28 AM  [facility] [address], [place], [place], [address] Phone: [phone]   Fax:  [phone]  Name: Lee Lee MRN: [id number] Date of Birth: 11/26/1951

## 2017-11-24 ENCOUNTER — Ambulatory Visit: Payer: BLUE CROSS/BLUE SHIELD

## 2017-12-01 ENCOUNTER — Ambulatory Visit: Payer: PPO | Attending: Orthopaedic Surgery

## 2017-12-01 DIAGNOSIS — R2689 Other abnormalities of gait and mobility: Secondary | ICD-10-CM

## 2017-12-01 DIAGNOSIS — R600 Localized edema: Secondary | ICD-10-CM | POA: Diagnosis not present

## 2017-12-01 DIAGNOSIS — M25561 Pain in right knee: Secondary | ICD-10-CM | POA: Diagnosis not present

## 2017-12-01 DIAGNOSIS — M25661 Stiffness of right knee, not elsewhere classified: Secondary | ICD-10-CM | POA: Diagnosis not present

## 2017-12-01 DIAGNOSIS — M6281 Muscle weakness (generalized): Secondary | ICD-10-CM | POA: Diagnosis not present

## 2017-12-01 NOTE — Therapy (Signed)
[facility] [address] [place], [place], [address] Phone: [phone]   Fax:  [phone]  Physical Therapy Treatment  Patient Details  Name: Lee Lee MRN: [id number] Date of Birth: May 12, 1951 Referring Provider: Dr. Rodell Perna   Encounter Date: 12/01/2017  PT End of Session - 12/01/17 0927    Visit Number  15    Date for PT Re-Evaluation  01/11/18    PT Start Time  0845    PT Stop Time  0928    PT Time Calculation (min)  43 min    Activity Tolerance  Patient tolerated treatment well    Behavior During Therapy  [facility] for tasks assessed/performed       Past Medical History:  Diagnosis Date  . Alcohol abuse, episodic drinking behavior   . Arthritis    back & knees  . Atrial flutter ([place])    s/p RFCA 01/05/13  . Calcium oxalate renal stones   . Complication of anesthesia    makes him loopy  . ED (erectile dysfunction)   . Hemorrhoids   . Hypertension     Past Surgical History:  Procedure Laterality Date  . ABLATION OF DYSRHYTHMIC FOCUS  01/05/2013  . ARTHROSCOPIC REPAIR ACL    . ATRIAL FLUTTER ABLATION N/A 01/05/2013   Procedure: ATRIAL FLUTTER ABLATION;  Surgeon: Evans Lance, MD;  Location: [facility] CATH LAB;  Service: Cardiovascular;  Laterality: N/A;  . COLONOSCOPY  11/2009   Dr. Benson Norway  . ELECTROPHYSIOLOGIC STUDY N/A 07/04/2015   Procedure: A-Flutter Ablation;  Surgeon: Evans Lance, MD;  Location: [place] CV [facility];  Service: Cardiovascular;  Laterality: N/A;  . REPLACEMENT TOTAL KNEE Right 09/12/2017   by dr Lorin Mercy at [facility]  . ROTATOR CUFF REPAIR      There were no vitals filed for this visit.  Subjective Assessment - 12/01/17 0851    Subjective  I feel like I have reached a plateau.  My knee is still stiff.  I am having difficulty going down steps.      Pertinent History  Rt quad tendon repair: 09/12/17.      Currently in Pain?  No/denies                      [facility]  Adult PT Treatment/Exercise - 12/01/17 0001      Knee/Hip Exercises: Stretches   Active Hamstring Stretch  Both;3 reps;20 seconds      Knee/Hip Exercises: Aerobic   Elliptical  Level 2, ramp 3x 3 minutes    Recumbent Bike  L4-5x 6 min verbal cues for quad activation      Knee/Hip Exercises: Machines for Strengthening   Total Gym Leg Press  Seat 8 RTLE 45# 3x10      Knee/Hip Exercises: Standing   Step Down  Right;2 sets;5 sets;Step Height: 4";Hand Hold: 1    Rebounder  wt shifting 3 ways 1 min each    Walking with Sports Cord  25# 10x forward and back 20# side steps bil 6x      Knee/Hip Exercises: Seated   Long Arc Quad  Strengthening;Right;2 sets;10 reps;Weights 3 sec hold, VC to DF ankle    Long Arc Quad Weight  3 lbs.      Knee/Hip Exercises: Supine   Straight Leg Raises  Strengthening;Right;2 sets;5 reps difficulty keeping knee straight               PT Short Term Goals - 10/03/17 4270  PT SHORT TERM GOAL #3   Title  demonstrate good quad set on the Rt to improve Rt LE stability with gait    Time  4    Period  Weeks    Status  Achieved        PT Long Term Goals - 11/16/17 1660      PT LONG TERM GOAL #1   Title  be independent in advanced HEP    Time  8    Period  Weeks    Status  On-going      PT LONG TERM GOAL #2   Title  reduce FOTO to < or = to 35% limitation    Time  8    Period  Weeks    Status  On-going      PT LONG TERM GOAL #4   Title  demonstrate 4+/5 Rt quad strength to improve stabilization with gait    Time  8    Period  Weeks    Status  On-going            Plan - 12/01/17 6301    Clinical Impression Statement  Pt with Rt quad weakness epecially with descending steps.  Pt had a lapse in therapy due to travel.  Pt tolerated advancement of weight on the leg press and addition of the elliptical today.  Pt with Rt quad instability with single leg activity and eccentric motion.  Pt will continue to benefit from skilled PT for LE  strength, endurance and eccentric control.      Rehab Potential  Good    Clinical Impairments Affecting Rehab Potential  Right quad tendon repair 09/12/2017; ROM and resistance exercises to right knee    PT Frequency  2x / week    PT Duration  8 weeks    PT Treatment/Interventions  ADLs/Self Care Home Management;Cryotherapy;Electrical Stimulation;Functional mobility training;Stair training;Moist Heat;Therapeutic activities;Therapeutic exercise;Neuromuscular re-education;Passive range of motion;Scar mobilization;Manual techniques;Taping;Vasopneumatic Device    PT Next Visit Plan  Knee and hip strength, proprioception, flexibility    Consulted and Agree with Plan of Care  Patient       Patient will benefit from skilled therapeutic intervention in order to improve the following deficits and impairments:  Abnormal gait, Difficulty walking, Pain, Decreased endurance, Decreased activity tolerance, Decreased range of motion, Increased edema, Decreased mobility  Visit Diagnosis: Acute pain of right knee  Stiffness of right knee, not elsewhere classified  Localized edema  Other abnormalities of gait and mobility  Muscle weakness (generalized)     Problem List Patient Active Problem List   Diagnosis Date Noted  . Rupture of right quadriceps tendon 10/18/2017  . Insomnia due to anxiety and fear 09/30/2016  . Restless leg syndrome, familial 09/30/2016  . Iron deficiency anemia 09/30/2016  . Hypertriglyceridemia 06/15/2016  . Hyperlipidemia with target LDL less than 130 06/15/2016  . Hyperglycemia 06/15/2016  . S/P radiofrequency ablation operation for arrhythmia 07/04/15 07/05/2015  . SVT (supraventricular tachycardia) ([place]) 07/04/2015  . Melanoma of skin ([place]) 04/16/2015  . Atrial flutter ([place]) 12/07/2012  . Essential hypertension 11/07/2012  . Ascending aortic aneurysm ([place]) 11/07/2012  . ED (erectile dysfunction) 11/15/2011    Sigurd Sos, PT 12/01/17 9:30 AM  [facility] [address] [place], [place], [address] Phone: [phone]   Fax:  [phone]  Name: Lee Lee MRN: [id number] Date of Birth: 1951/02/16

## 2017-12-02 ENCOUNTER — Ambulatory Visit: Payer: PPO | Admitting: Physical Therapy

## 2017-12-02 DIAGNOSIS — M6281 Muscle weakness (generalized): Secondary | ICD-10-CM

## 2017-12-02 DIAGNOSIS — M25661 Stiffness of right knee, not elsewhere classified: Secondary | ICD-10-CM

## 2017-12-02 DIAGNOSIS — M25561 Pain in right knee: Secondary | ICD-10-CM | POA: Diagnosis not present

## 2017-12-02 DIAGNOSIS — R2689 Other abnormalities of gait and mobility: Secondary | ICD-10-CM

## 2017-12-02 DIAGNOSIS — R600 Localized edema: Secondary | ICD-10-CM

## 2017-12-02 NOTE — Therapy (Signed)
[facility] [address] [place], [place], [address] Phone: [phone]   Fax:  [phone]  Physical Therapy Treatment  Patient Details  Name: Lee Lee MRN: [id number] Date of Birth: [birth year] Referring Provider: Dr. Rodell Perna   Encounter Date: 12/02/2017  PT End of Session - 12/02/17 0915    Visit Number  15    Date for PT Re-Evaluation  01/11/18    PT Start Time  6734    PT Stop Time  0927    PT Time Calculation (min)  40 min    Activity Tolerance  Patient tolerated treatment well;No increased pain    Behavior During Therapy  WFL for tasks assessed/performed       Past Medical History:  Diagnosis Date  . Alcohol abuse, episodic drinking behavior   . Arthritis    back & knees  . Atrial flutter ([place])    s/p RFCA 01/05/13  . Calcium oxalate renal stones   . Complication of anesthesia    makes him loopy  . ED (erectile dysfunction)   . Hemorrhoids   . Hypertension     Past Surgical History:  Procedure Laterality Date  . ABLATION OF DYSRHYTHMIC FOCUS  01/05/2013  . ARTHROSCOPIC REPAIR ACL    . ATRIAL FLUTTER ABLATION N/A 01/05/2013   Procedure: ATRIAL FLUTTER ABLATION;  Surgeon: Evans Lance, MD;  Location: [facility] CATH LAB;  Service: Cardiovascular;  Laterality: N/A;  . COLONOSCOPY  11/2009   Dr. Benson Norway  . ELECTROPHYSIOLOGIC STUDY N/A 07/04/2015   Procedure: A-Flutter Ablation;  Surgeon: Evans Lance, MD;  Location: [place] [facility];  Service: Cardiovascular;  Laterality: N/A;  . REPLACEMENT TOTAL KNEE Right 09/12/2017   by dr Lorin Mercy at [facility]  . ROTATOR CUFF REPAIR      There were no vitals filed for this visit.  Subjective Assessment - 12/02/17 0850    Subjective  Pt reports things are going well. He is a little stiff today, but notes no pain.     Pertinent History  Rt quad tendon repair: 09/12/17.      Currently in Pain?  No/denies                      [facility] Adult PT  Treatment/Exercise - 12/02/17 0001      Knee/Hip Exercises: Aerobic   Elliptical  L1, ramp 3 x4 min forward      Knee/Hip Exercises: Standing   Step Down  2 sets;Right;5 reps;Hand Hold: 1;Step Height: 4"    Other Standing Knee Exercises  TKE RLE with yellow TB x10 reps       Knee/Hip Exercises: Supine   Short Arc Quad Sets  3 sets;10 reps;Limitations    Short Arc Quad Sets Limitations  3rd set with 1# weight    Other Supine Knee/Hip Exercises  straight leg bridge on 8" box 2x10 reps     Other Supine Knee/Hip Exercises  active hamstring stretch at 90/90 10x5 sec hold each      Knee/Hip Exercises: Prone   Hamstring Curl  1 set;10 reps;Limitations    Hamstring Curl Limitations  double red TB       Manual Therapy   Soft tissue mobilization  STM Rt quadriceps             PT Education - 12/02/17 0914    Education provided  Yes    Education Details  technique with therex; noted progressions in LE strength  Person(s) Educated  Patient    Methods  Explanation;Verbal cues    Comprehension  Verbalized understanding;Returned demonstration       PT Short Term Goals - 10/03/17 0914      PT SHORT TERM GOAL #3   Title  demonstrate good quad set on the Rt to improve Rt LE stability with gait    Time  4    Period  Weeks    Status  Achieved        PT Long Term Goals - 12/02/17 0930      PT LONG TERM GOAL #1   Title  be independent in advanced HEP    Time  8    Period  Weeks    Status  On-going      PT LONG TERM GOAL #2   Title  reduce FOTO to < or = to 35% limitation    Time  8    Period  Weeks    Status  On-going      PT LONG TERM GOAL #3   Title  demonstrate Rt knee P/ROM flexion to > or = to 95 degrees to allow for sitting without substitution    Time  8    Period  Weeks    Status  Achieved      PT LONG TERM GOAL #4   Title  demonstrate 4+/5 Rt quad strength to improve stabilization with gait    Time  8    Period  Weeks    Status  On-going      PT LONG  TERM GOAL #5   Title  report < or = to 2/10 Rt LE strength with standing and walking    Time  8    Period  Weeks    Status  Achieved      PT LONG TERM GOAL #6   Title  ambulate without his right knee not giving way due to strength >/= 4/5 and minimal to no limp    Time  8    Period  Weeks    Status  Partially Met      PT LONG TERM GOAL #7   Title  abilty to go up and down steps with a step over step pattern due to improve eccentric contraction of the right quadriceps     Time  8    Period  Weeks    Status  On-going            Plan - 12/02/17 1607    Clinical Impression Statement  Today's session continued with LE strengthening progressions. Added light weight to short arc quads this session without report of knee pain, noting muscle fatigue and shaking primarily. Also added hamstring and posterior strengthening exercise this session with good completion by the patient. Pt does have significant quadriceps spasm/tightness and soft tissue mobilization was used end of session to address this. Pt reported no increase in soreness following today's activities.     Rehab Potential  Good    Clinical Impairments Affecting Rehab Potential  Right quad tendon repair 09/12/2017; ROM and resistance exercises to right knee    PT Frequency  2x / week    PT Duration  8 weeks    PT Treatment/Interventions  ADLs/Self Care Home Management;Cryotherapy;Electrical Stimulation;Functional mobility training;Stair training;Moist Heat;Therapeutic activities;Therapeutic exercise;Neuromuscular re-education;Passive range of motion;Scar mobilization;Manual techniques;Taping;Vasopneumatic Device    PT Next Visit Plan  knee strength progressions, closed chain strengthening in low ROM; knee proprioception, posterior chain strengthening  PT Home Exercise Plan  progress as needed    Consulted and Agree with Plan of Care  Patient       Patient will benefit from skilled therapeutic intervention in order to improve  the following deficits and impairments:  Abnormal gait, Difficulty walking, Pain, Decreased endurance, Decreased activity tolerance, Decreased range of motion, Increased edema, Decreased mobility  Visit Diagnosis: Acute pain of right knee  Stiffness of right knee, not elsewhere classified  Localized edema  Other abnormalities of gait and mobility  Muscle weakness (generalized)     Problem List Patient Active Problem List   Diagnosis Date Noted  . Rupture of right quadriceps tendon 10/18/2017  . Insomnia due to anxiety and fear 09/30/2016  . Restless leg syndrome, familial 09/30/2016  . Iron deficiency anemia 09/30/2016  . Hypertriglyceridemia 06/15/2016  . Hyperlipidemia with target LDL less than 130 06/15/2016  . Hyperglycemia 06/15/2016  . S/P radiofrequency ablation operation for arrhythmia 07/04/15 07/05/2015  . SVT (supraventricular tachycardia) ([place]) 07/04/2015  . Melanoma of skin ([place]) 04/16/2015  . Atrial flutter ([place]) 12/07/2012  . Essential hypertension 11/07/2012  . Ascending aortic aneurysm ([place]) 11/07/2012  . ED (erectile dysfunction) 11/15/2011    9:34 AM,12/02/17 Elly Modena PT, DPT [facility] [address], [place], [place], [address] Phone: [phone]   Fax:  [phone]  Name: Lee Lee MRN: [id number] Date of Birth: 1951-01-25

## 2017-12-05 ENCOUNTER — Encounter: Payer: Self-pay | Admitting: Physical Therapy

## 2017-12-05 ENCOUNTER — Ambulatory Visit: Payer: PPO | Admitting: Physical Therapy

## 2017-12-05 DIAGNOSIS — M25561 Pain in right knee: Secondary | ICD-10-CM | POA: Diagnosis not present

## 2017-12-05 DIAGNOSIS — M6281 Muscle weakness (generalized): Secondary | ICD-10-CM

## 2017-12-05 DIAGNOSIS — R600 Localized edema: Secondary | ICD-10-CM

## 2017-12-05 DIAGNOSIS — R2689 Other abnormalities of gait and mobility: Secondary | ICD-10-CM

## 2017-12-05 DIAGNOSIS — M25661 Stiffness of right knee, not elsewhere classified: Secondary | ICD-10-CM

## 2017-12-05 NOTE — Therapy (Signed)
[facility] [address] [place], [place], [address] Phone: [phone]   Fax:  [phone]  Physical Therapy Treatment  Patient Details  Name: Lee Lee MRN: [id number] Date of Birth: 08-03-1951 Referring Provider: Dr. Rodell Perna   Encounter Date: 12/05/2017  PT End of Session - 12/05/17 0819    Visit Number  16    Date for PT Re-Evaluation  01/11/18    Authorization Type  [employer]    PT Start Time  0815    PT Stop Time  0900    PT Time Calculation (min)  45 min    Activity Tolerance  Patient tolerated treatment well;No increased pain    Behavior During Therapy  WFL for tasks assessed/performed       Past Medical History:  Diagnosis Date  . Alcohol abuse, episodic drinking behavior   . Arthritis    back & knees  . Atrial flutter ([place])    s/p RFCA 01/05/13  . Calcium oxalate renal stones   . Complication of anesthesia    makes him loopy  . ED (erectile dysfunction)   . Hemorrhoids   . Hypertension     Past Surgical History:  Procedure Laterality Date  . ABLATION OF DYSRHYTHMIC FOCUS  01/05/2013  . ARTHROSCOPIC REPAIR ACL    . ATRIAL FLUTTER ABLATION N/A 01/05/2013   Procedure: ATRIAL FLUTTER ABLATION;  Surgeon: Evans Lance, MD;  Location: [facility] CATH LAB;  Service: Cardiovascular;  Laterality: N/A;  . COLONOSCOPY  11/2009   Dr. Benson Norway  . ELECTROPHYSIOLOGIC STUDY N/A 07/04/2015   Procedure: A-Flutter Ablation;  Surgeon: Evans Lance, MD;  Location: [place] CV LAB;  Service: Cardiovascular;  Laterality: N/A;  . REPLACEMENT TOTAL KNEE Right 09/12/2017   by dr Lorin Mercy at [facility]  . ROTATOR CUFF REPAIR      There were no vitals filed for this visit.  Subjective Assessment - 12/05/17 0817    Subjective  My hamstrings have been super sore since last week. No knee pain, just muscle soreness.    Pertinent History  Rt quad tendon repair: 09/12/17.      Limitations  Standing;Walking    Patient Stated  Goals  increase Rt knee A/ROM, improve strength, return to walking without limitation    Currently in Pain?  No/denies    Multiple Pain Sites  No                      [facility] Adult PT Treatment/Exercise - 12/05/17 0001      Neuro Re-ed    Neuro Re-ed Details   Single limb stance with red ball plyo toss 3x10      Knee/Hip Exercises: Stretches   Active Hamstring Stretch  Both;3 reps;30 seconds      Knee/Hip Exercises: Aerobic   Elliptical  L2 R 3 x 6 min    Recumbent Bike  L4 x 10 min      Knee/Hip Exercises: Machines for Strengthening   Total Gym Leg Press  Seat 7 Bil 70# 10x, RTLE eccentic 70# 10x,       Knee/Hip Exercises: Standing   Walking with Sports Cord  30# 10x forward and back 25# side steps bil 6x    Other Standing Knee Exercises  BOSU: step up RT, SLS, then step down 10x some UE needed for balance               PT Short Term Goals - 10/03/17 6546  PT SHORT TERM GOAL #3   Title  demonstrate good quad set on the Rt to improve Rt LE stability with gait    Time  4    Period  Weeks    Status  Achieved        PT Long Term Goals - 12/02/17 0930      PT LONG TERM GOAL #1   Title  be independent in advanced HEP    Time  8    Period  Weeks    Status  On-going      PT LONG TERM GOAL #2   Title  reduce FOTO to < or = to 35% limitation    Time  8    Period  Weeks    Status  On-going      PT LONG TERM GOAL #3   Title  demonstrate Rt knee P/ROM flexion to > or = to 95 degrees to allow for sitting without substitution    Time  8    Period  Weeks    Status  Achieved      PT LONG TERM GOAL #4   Title  demonstrate 4+/5 Rt quad strength to improve stabilization with gait    Time  8    Period  Weeks    Status  On-going      PT LONG TERM GOAL #5   Title  report < or = to 2/10 Rt LE strength with standing and walking    Time  8    Period  Weeks    Status  Achieved      PT LONG TERM GOAL #6   Title  ambulate without his right knee not  giving way due to strength >/= 4/5 and minimal to no limp    Time  8    Period  Weeks    Status  Partially Met      PT LONG TERM GOAL #7   Title  abilty to go up and down steps with a step over step pattern due to improve eccentric contraction of the right quadriceps     Time  8    Period  Weeks    Status  On-going            Plan - 12/05/17 3474    Clinical Impression Statement  Advanced strength today by adding weight to leg press and the resisted walking. Worked some more on proprioception with static Single Leg Stance and some dynamic by adding the ball toss. Knee visably shakey and fatigued at the end of session.   (Pended)     Rehab Potential  Good    Clinical Impairments Affecting Rehab Potential  Right quad tendon repair 09/12/2017; ROM and resistance exercises to right knee    PT Frequency  2x / week    PT Duration  8 weeks    PT Treatment/Interventions  ADLs/Self Care Home Management;Cryotherapy;Electrical Stimulation;Functional mobility training;Stair training;Moist Heat;Therapeutic activities;Therapeutic exercise;Neuromuscular re-education;Passive range of motion;Scar mobilization;Manual techniques;Taping;Vasopneumatic Device  (Pended)     PT Next Visit Plan  knee strength progressions, closed chain strengthening in low ROM; knee proprioception, posterior chain strengthening  (Pended)     Consulted and Agree with Plan of Care  Patient       Patient will benefit from skilled therapeutic intervention in order to improve the following deficits and impairments:  Abnormal gait, Difficulty walking, Pain, Decreased endurance, Decreased activity tolerance, Decreased range of motion, Increased edema, Decreased mobility  Visit Diagnosis: Acute pain of  right knee  Stiffness of right knee, not elsewhere classified  Localized edema  Other abnormalities of gait and mobility  Muscle weakness (generalized)     Problem List Patient Active Problem List   Diagnosis Date Noted   . Rupture of right quadriceps tendon 10/18/2017  . Insomnia due to anxiety and fear 09/30/2016  . Restless leg syndrome, familial 09/30/2016  . Iron deficiency anemia 09/30/2016  . Hypertriglyceridemia 06/15/2016  . Hyperlipidemia with target LDL less than 130 06/15/2016  . Hyperglycemia 06/15/2016  . S/P radiofrequency ablation operation for arrhythmia 07/04/15 07/05/2015  . SVT (supraventricular tachycardia) ([place]) 07/04/2015  . Melanoma of skin ([place]) 04/16/2015  . Atrial flutter ([place]) 12/07/2012  . Essential hypertension 11/07/2012  . Ascending aortic aneurysm ([place]) 11/07/2012  . ED (erectile dysfunction) 11/15/2011    Lorik Guo, PTA 12/05/2017, 8:49 AM  [facility] [address] [place], [place], [address] Phone: [phone]   Fax:  [phone]  Name: Duffy Dantonio MRN: [id number] Date of Birth: Jun 21, 1951

## 2017-12-06 DIAGNOSIS — F1020 Alcohol dependence, uncomplicated: Secondary | ICD-10-CM | POA: Diagnosis not present

## 2017-12-07 ENCOUNTER — Ambulatory Visit: Payer: PPO | Admitting: Physical Therapy

## 2017-12-07 ENCOUNTER — Encounter: Payer: Self-pay | Admitting: Physical Therapy

## 2017-12-07 DIAGNOSIS — R600 Localized edema: Secondary | ICD-10-CM

## 2017-12-07 DIAGNOSIS — M6281 Muscle weakness (generalized): Secondary | ICD-10-CM

## 2017-12-07 DIAGNOSIS — M25561 Pain in right knee: Secondary | ICD-10-CM

## 2017-12-07 DIAGNOSIS — R2689 Other abnormalities of gait and mobility: Secondary | ICD-10-CM

## 2017-12-07 DIAGNOSIS — M25661 Stiffness of right knee, not elsewhere classified: Secondary | ICD-10-CM

## 2017-12-07 NOTE — Therapy (Signed)
[facility] [address], [place], [place], [address] Phone: [phone]   Fax:  [phone]  Physical Therapy Treatment  Patient Details  Name: Lee Lee MRN: [id number] Date of Birth: January 28, 1951 Referring Provider: Dr. Rodell Perna   Encounter Date: 12/07/2017  PT End of Session - 12/07/17 0853    Visit Number  17    Date for PT Re-Evaluation  01/11/18    Authorization Type  BCBS    PT Start Time  [phone]    PT Stop Time  0935    PT Time Calculation (min)  44 min    Equipment Utilized During Treatment  Right knee immobilizer    Activity Tolerance  Patient tolerated treatment well;No increased pain    Behavior During Therapy  WFL for tasks assessed/performed       Past Medical History:  Diagnosis Date  . Alcohol abuse, episodic drinking behavior   . Arthritis    back & knees  . Atrial flutter ([place])    s/p RFCA 01/05/13  . Calcium oxalate renal stones   . Complication of anesthesia    makes him loopy  . ED (erectile dysfunction)   . Hemorrhoids   . Hypertension     Past Surgical History:  Procedure Laterality Date  . ABLATION OF DYSRHYTHMIC FOCUS  01/05/2013  . ARTHROSCOPIC REPAIR ACL    . ATRIAL FLUTTER ABLATION N/A 01/05/2013   Procedure: ATRIAL FLUTTER ABLATION;  Surgeon: Evans Lance, MD;  Location: [facility] CATH LAB;  Service: Cardiovascular;  Laterality: N/A;  . COLONOSCOPY  11/2009   Dr. Benson Norway  . ELECTROPHYSIOLOGIC STUDY N/A 07/04/2015   Procedure: A-Flutter Ablation;  Surgeon: Evans Lance, MD;  Location: [facility];  Service: Cardiovascular;  Laterality: N/A;  . REPLACEMENT TOTAL KNEE Right 09/12/2017   by dr Lorin Mercy at [facility]  . ROTATOR CUFF REPAIR      There were no vitals filed for this visit.  Subjective Assessment - 12/07/17 0854    Subjective  Just muscular soreness, typically the next day. I can feel my knee/leg getting stronger.    Pertinent History  Rt quad tendon repair:  09/12/17.      Limitations  Standing;Walking    Currently in Pain?  No/denies    Multiple Pain Sites  No                      [facility] Adult PT Treatment/Exercise - 12/07/17 0001      Neuro Re-ed    Neuro Re-ed Details   Single limb stance with red ball plyo toss 2x 20 Lt toes slightly on floor for balance, VC to contract quad >      Knee/Hip Exercises: Stretches   Active Hamstring Stretch  Both;3 reps;30 seconds      Knee/Hip Exercises: Aerobic   Elliptical  L3 R 3 x 6 min    Recumbent Bike  L4 x 10 min VC to push and pull intermittently      Knee/Hip Exercises: Machines for Strengthening   Total Gym Leg Press  Seat 7: Bil 70# 10x, 80# 10x, RTLE eccentric 80# 2x10      Knee/Hip Exercises: Standing   Forward Step Up  Right;2 sets;15 reps;Hand Hold: 0;Step Height: 6"    Step Down  Right;2 sets;10 reps;Hand Hold: 1;Step Height: 6"    Walking with Sports Cord  30# 10x ea direction  PT Short Term Goals - 10/03/17 0914      PT SHORT TERM GOAL #3   Title  demonstrate good quad set on the Rt to improve Rt LE stability with gait    Time  4    Period  Weeks    Status  Achieved        PT Long Term Goals - 12/07/17 0912      PT LONG TERM GOAL #7   Title  abilty to go up and down steps with a step over step pattern due to improve eccentric contraction of the right quadriceps     Time  8    Period  Weeks    Status  -- Can do reciprocally most of the time with no  giving way, just not easy            Plan - 12/07/17 0853    Clinical Impression Statement  Pt had to go up & down 2 flights of stairs yesterday. He noted there was no giving way of the knee and the quad felt stronger, more capable of lowering his body. Partially meeting the long term goal. Pt able to tolerate longer periods of single limb stance dynamically with some LE support from LTLE and more weight on the leg press indicating he is improving in his quad strength.     Rehab  Potential  Good    Clinical Impairments Affecting Rehab Potential  Right quad tendon repair 09/12/2017; ROM and resistance exercises to right knee    PT Frequency  2x / week    PT Duration  8 weeks    PT Treatment/Interventions  ADLs/Self Care Home Management;Cryotherapy;Electrical Stimulation;Functional mobility training;Stair training;Moist Heat;Therapeutic activities;Therapeutic exercise;Neuromuscular re-education;Passive range of motion;Scar mobilization;Manual techniques;Taping;Vasopneumatic Device    PT Next Visit Plan  knee strength progressions, closed chain strengthening in low ROM; knee proprioception, posterior chain strengthening    PT Home Exercise Plan  progress as needed    Consulted and Agree with Plan of Care  Patient       Patient will benefit from skilled therapeutic intervention in order to improve the following deficits and impairments:  Abnormal gait, Difficulty walking, Pain, Decreased endurance, Decreased activity tolerance, Decreased range of motion, Increased edema, Decreased mobility  Visit Diagnosis: Acute pain of right knee  Stiffness of right knee, not elsewhere classified  Localized edema  Other abnormalities of gait and mobility  Muscle weakness (generalized)     Problem List Patient Active Problem List   Diagnosis Date Noted  . Rupture of right quadriceps tendon 10/18/2017  . Insomnia due to anxiety and fear 09/30/2016  . Restless leg syndrome, familial 09/30/2016  . Iron deficiency anemia 09/30/2016  . Hypertriglyceridemia 06/15/2016  . Hyperlipidemia with target LDL less than 130 06/15/2016  . Hyperglycemia 06/15/2016  . S/P radiofrequency ablation operation for arrhythmia 07/04/15 07/05/2015  . SVT (supraventricular tachycardia) ([place]) 07/04/2015  . Melanoma of skin ([place]) 04/16/2015  . Atrial flutter ([place]) 12/07/2012  . Essential hypertension 11/07/2012  . Ascending aortic aneurysm ([place]) 11/07/2012  . ED (erectile dysfunction) 11/15/2011     COCHRAN,JENNIFER, PTA 12/07/2017, 9:28 AM  [facility] [address], [place], [place], [address] Phone: [phone]   Fax:  [phone]  Name: Lee Lee MRN: [id number] Date of Birth: 1951-07-16

## 2017-12-12 ENCOUNTER — Encounter: Payer: BLUE CROSS/BLUE SHIELD | Admitting: Physical Therapy

## 2017-12-13 DIAGNOSIS — F1020 Alcohol dependence, uncomplicated: Secondary | ICD-10-CM | POA: Diagnosis not present

## 2017-12-14 ENCOUNTER — Ambulatory Visit: Payer: PPO | Admitting: Physical Therapy

## 2017-12-14 ENCOUNTER — Encounter: Payer: Self-pay | Admitting: Physical Therapy

## 2017-12-14 DIAGNOSIS — F1020 Alcohol dependence, uncomplicated: Secondary | ICD-10-CM | POA: Diagnosis not present

## 2017-12-14 DIAGNOSIS — M25661 Stiffness of right knee, not elsewhere classified: Secondary | ICD-10-CM

## 2017-12-14 DIAGNOSIS — M25561 Pain in right knee: Secondary | ICD-10-CM

## 2017-12-14 DIAGNOSIS — R600 Localized edema: Secondary | ICD-10-CM

## 2017-12-14 DIAGNOSIS — R2689 Other abnormalities of gait and mobility: Secondary | ICD-10-CM

## 2017-12-14 DIAGNOSIS — M6281 Muscle weakness (generalized): Secondary | ICD-10-CM

## 2017-12-14 NOTE — Therapy (Signed)
[facility] [address] [place], [place], [address] Phone: [phone]   Fax:  [phone]  Physical Therapy Treatment  Patient Details  Name: Lee Lee MRN: [id number] Date of Birth: 05/21/1951 Referring Provider: Dr. Rodell Perna   Encounter Date: 12/14/2017  PT End of Session - 12/14/17 0857    Visit Number  18    Date for PT Re-Evaluation  01/11/18    Authorization Type  BCBS    PT Start Time  0848    PT Stop Time  0928    PT Time Calculation (min)  40 min    Activity Tolerance  Patient tolerated treatment well;No increased pain    Behavior During Therapy  WFL for tasks assessed/performed       Past Medical History:  Diagnosis Date  . Alcohol abuse, episodic drinking behavior   . Arthritis    back & knees  . Atrial flutter ([place])    s/p RFCA 01/05/13  . Calcium oxalate renal stones   . Complication of anesthesia    makes him loopy  . ED (erectile dysfunction)   . Hemorrhoids   . Hypertension     Past Surgical History:  Procedure Laterality Date  . ABLATION OF DYSRHYTHMIC FOCUS  01/05/2013  . ARTHROSCOPIC REPAIR ACL    . ATRIAL FLUTTER ABLATION N/A 01/05/2013   Procedure: ATRIAL FLUTTER ABLATION;  Surgeon: Evans Lance, MD;  Location[facility] CATH LAB;  Service: Cardiovascular;  Laterality: N/A;  . COLONOSCOPY  11/2009   Dr. Benson Norway  . ELECTROPHYSIOLOGIC STUDY N/A 07/04/2015   Procedure: A-Flutter Ablation;  Surgeon: Evans Lance, MD;  Location: [facility];  Service: Cardiovascular;  Laterality: N/A;  . REPLACEMENT TOTAL KNEE Right 09/12/2017   by dr Lorin Mercy at [facility]  . ROTATOR CUFF REPAIR      There were no vitals filed for this visit.  Subjective Assessment - 12/14/17 0858    Subjective  My calf and hamstrings continue to be sore the day after PT. My quads are fine, do not get sore. No knee pain unless I go to kneel on them.     Currently in Pain?  No/denies    Multiple Pain Sites   No                      [facility] Adult PT Treatment/Exercise - 12/14/17 0001      Neuro Re-ed    Neuro Re-ed Details   Single limb stance with red ball plyo toss 2x 20 then RTLE on black balance square for 2x20 with LT toes on floor Lt toes slightly on floor for balance, VC to contract quad>       Knee/Hip Exercises: Stretches   Active Hamstring Stretch  Right;4 reps;30 seconds Supine with strap    Gastroc Stretch  -- On slant board 3x 30 sec      Knee/Hip Exercises: Aerobic   Recumbent Bike  L4 x 10 min VC to push and pull intermittently      Knee/Hip Exercises: Machines for Strengthening   Cybex Knee Flexion  20# Bil >RT than LT 2x10    Total Gym Leg Press  Seat 7 Bil 75# 20x, RTLE 50# 10x, 60# 10x      Manual Therapy   Soft tissue mobilization  Instrument asst Rock blade techniques to RT hamstring and gastroc in prone               PT  Short Term Goals - 10/03/17 0914      PT SHORT TERM GOAL #3   Title  demonstrate good quad set on the Rt to improve Rt LE stability with gait    Time  4    Period  Weeks    Status  Achieved        PT Long Term Goals - 12/07/17 4034      PT LONG TERM GOAL #7   Title  abilty to go up and down steps with a step over step pattern due to improve eccentric contraction of the right quadriceps     Time  8    Period  Weeks    Status  -- Can do reciprocally most of the time with no  giving way, just not easy            Plan - 12/14/17 0857    Clinical Impression Statement  Pt's main complaint is of calf and hamstring soreness. He currently stretches 2x day and states stretching before bed has helped night time soreness. Pt continues to need asst from LT foot in single limb stance. Added black balance square for additional dhallenge today.  Added Rock blading techniques to hamstring and gastoc  to see if we can get his soreness down.     Rehab Potential  Good    Clinical Impairments Affecting Rehab Potential  Right quad  tendon repair 09/12/2017; ROM and resistance exercises to right knee    PT Frequency  2x / week    PT Duration  8 weeks    PT Treatment/Interventions  ADLs/Self Care Home Management;Cryotherapy;Electrical Stimulation;Functional mobility training;Stair training;Moist Heat;Therapeutic activities;Therapeutic exercise;Neuromuscular re-education;Passive range of motion;Scar mobilization;Manual techniques;Taping;Vasopneumatic Device    PT Next Visit Plan  knee strength progressions, closed chain strengthening in low ROM; knee proprioception, posterior chain strengthening    Consulted and Agree with Plan of Care  Patient       Patient will benefit from skilled therapeutic intervention in order to improve the following deficits and impairments:  Abnormal gait, Difficulty walking, Pain, Decreased endurance, Decreased activity tolerance, Decreased range of motion, Increased edema, Decreased mobility  Visit Diagnosis: Acute pain of right knee  Stiffness of right knee, not elsewhere classified  Localized edema  Other abnormalities of gait and mobility  Muscle weakness (generalized)     Problem List Patient Active Problem List   Diagnosis Date Noted  . Rupture of right quadriceps tendon 10/18/2017  . Insomnia due to anxiety and fear 09/30/2016  . Restless leg syndrome, familial 09/30/2016  . Iron deficiency anemia 09/30/2016  . Hypertriglyceridemia 06/15/2016  . Hyperlipidemia with target LDL less than 130 06/15/2016  . Hyperglycemia 06/15/2016  . S/P radiofrequency ablation operation for arrhythmia 07/04/15 07/05/2015  . SVT (supraventricular tachycardia) ([place]) 07/04/2015  . Melanoma of skin ([place]) 04/16/2015  . Atrial flutter ([place]) 12/07/2012  . Essential hypertension 11/07/2012  . Ascending aortic aneurysm ([place]) 11/07/2012  . ED (erectile dysfunction) 11/15/2011    Emiyah Spraggins, PTA 12/14/2017, 9:29 AM  [facility] [address], [place], [place], [address] Phone: [phone]   Fax:  [phone]  Name: Johnny Gorter MRN: [id number] Date of Birth: 1951/01/18

## 2017-12-18 ENCOUNTER — Other Ambulatory Visit: Payer: Self-pay | Admitting: Internal Medicine

## 2017-12-18 DIAGNOSIS — I7121 Aneurysm of the ascending aorta, without rupture: Secondary | ICD-10-CM

## 2017-12-18 DIAGNOSIS — I712 Thoracic aortic aneurysm, without rupture: Secondary | ICD-10-CM

## 2017-12-18 DIAGNOSIS — I10 Essential (primary) hypertension: Secondary | ICD-10-CM

## 2017-12-19 ENCOUNTER — Ambulatory Visit: Payer: PPO | Admitting: Physical Therapy

## 2017-12-19 ENCOUNTER — Encounter: Payer: Self-pay | Admitting: Physical Therapy

## 2017-12-19 DIAGNOSIS — R2689 Other abnormalities of gait and mobility: Secondary | ICD-10-CM

## 2017-12-19 DIAGNOSIS — M25561 Pain in right knee: Secondary | ICD-10-CM

## 2017-12-19 DIAGNOSIS — M25661 Stiffness of right knee, not elsewhere classified: Secondary | ICD-10-CM

## 2017-12-19 DIAGNOSIS — R600 Localized edema: Secondary | ICD-10-CM

## 2017-12-19 DIAGNOSIS — M6281 Muscle weakness (generalized): Secondary | ICD-10-CM

## 2017-12-19 NOTE — Therapy (Signed)
[facility] [address], [place], [place], [address] Phone: [phone]   Fax:  [phone]  Physical Therapy Treatment  Patient Details  Name: Lee Lee MRN: [id number] Date of Birth: 16-Dec-1950 Referring Provider: Dr. Rodell Perna   Encounter Date: 12/19/2017  PT End of Session - 12/19/17 0839    Visit Number  19    Date for PT Re-Evaluation  01/11/18    Authorization Type  BCBS    PT Start Time  0839    PT Stop Time  0922    PT Time Calculation (min)  43 min    Activity Tolerance  Patient tolerated treatment well;No increased pain    Behavior During Therapy  WFL for tasks assessed/performed       Past Medical History:  Diagnosis Date  . Alcohol abuse, episodic drinking behavior   . Arthritis    back & knees  . Atrial flutter ([place])    s/p RFCA 01/05/13  . Calcium oxalate renal stones   . Complication of anesthesia    makes him loopy  . ED (erectile dysfunction)   . Hemorrhoids   . Hypertension     Past Surgical History:  Procedure Laterality Date  . ABLATION OF DYSRHYTHMIC FOCUS  01/05/2013  . ARTHROSCOPIC REPAIR ACL    . ATRIAL FLUTTER ABLATION N/A 01/05/2013   Procedure: ATRIAL FLUTTER ABLATION;  Surgeon: Evans Lance, MD;  Location: [facility] CATH LAB;  Service: Cardiovascular;  Laterality: N/A;  . COLONOSCOPY  11/2009   Dr. Benson Norway  . ELECTROPHYSIOLOGIC STUDY N/A 07/04/2015   Procedure: A-Flutter Ablation;  Surgeon: Evans Lance, MD;  Location: [facility];  Service: Cardiovascular;  Laterality: N/A;  . REPLACEMENT TOTAL KNEE Right 09/12/2017   by dr Lorin Mercy at [facility]  . ROTATOR CUFF REPAIR      There were no vitals filed for this visit.  Subjective Assessment - 12/19/17 0841    Subjective  The work on the hamstrings helped reduce the soreness. Quads are sore after my workout yesterday.    Pertinent History  Rt quad tendon repair: 09/12/17.      Limitations  Standing;Walking     Currently in Pain?  No/denies    Multiple Pain Sites  No                      [facility] Adult PT Treatment/Exercise - 12/19/17 0001      Neuro Re-ed    Neuro Re-ed Details   Single limb stance with red ball plyo toss 3x 20 then RTLE on black balance square for 2x20 with LT toes on floor Lt toes slightly on floor for balance, VC to contract quad>       Knee/Hip Exercises: Stretches   Active Hamstring Stretch  Right;4 reps;30 seconds Supine with strap    Gastroc Stretch  -- On slant board 3x 30 sec      Knee/Hip Exercises: Aerobic   Recumbent Bike  L4 x 10 min VC to push and pull intermittently      Knee/Hip Exercises: Machines for Strengthening   Cybex Knee Flexion  25# 2x10 RT > LT       Knee/Hip Exercises: Standing   Forward Step Up  -- BOSU step ups 20x, light UE    Step Down  Right;2 sets;10 reps;Hand Hold: 1;Step Height: 6"    Walking with Sports Cord  35# 10x ea direction      Manual Therapy  Soft tissue mobilization  Instrument asst Rock blade techniques to RT hamstring and gastroc in prone               PT Short Term Goals - 10/03/17 0914      PT SHORT TERM GOAL #3   Title  demonstrate good quad set on the Rt to improve Rt LE stability with gait    Time  4    Period  Weeks    Status  Achieved        PT Long Term Goals - 12/07/17 2683      PT LONG TERM GOAL #7   Title  abilty to go up and down steps with a step over step pattern due to improve eccentric contraction of the right quadriceps     Time  8    Period  Weeks    Status  -- Can do reciprocally most of the time with no  giving way, just not easy            Plan - 12/19/17 0840    Clinical Impression Statement  Pt had relief to his hamstrings last session with manula techniques to improve soft tissue mobility. His single limb balance appears steadier ( nnot as much shaking) but still requires some assistance from the toes on the LT for balance.     Rehab Potential  Good     Clinical Impairments Affecting Rehab Potential  Right quad tendon repair 09/12/2017; ROM and resistance exercises to right knee    PT Frequency  2x / week    PT Duration  8 weeks    PT Treatment/Interventions  ADLs/Self Care Home Management;Cryotherapy;Electrical Stimulation;Functional mobility training;Stair training;Moist Heat;Therapeutic activities;Therapeutic exercise;Neuromuscular re-education;Passive range of motion;Scar mobilization;Manual techniques;Taping;Vasopneumatic Device    PT Next Visit Plan  knee strength progressions, closed chain strengthening in low ROM; knee proprioception, posterior chain strengthening    Consulted and Agree with Plan of Care  Patient       Patient will benefit from skilled therapeutic intervention in order to improve the following deficits and impairments:  Abnormal gait, Difficulty walking, Pain, Decreased endurance, Decreased activity tolerance, Decreased range of motion, Increased edema, Decreased mobility  Visit Diagnosis: Acute pain of right knee  Stiffness of right knee, not elsewhere classified  Localized edema  Other abnormalities of gait and mobility  Muscle weakness (generalized)     Problem List Patient Active Problem List   Diagnosis Date Noted  . Rupture of right quadriceps tendon 10/18/2017  . Insomnia due to anxiety and fear 09/30/2016  . Restless leg syndrome, familial 09/30/2016  . Iron deficiency anemia 09/30/2016  . Hypertriglyceridemia 06/15/2016  . Hyperlipidemia with target LDL less than 130 06/15/2016  . Hyperglycemia 06/15/2016  . S/P radiofrequency ablation operation for arrhythmia 07/04/15 07/05/2015  . SVT (supraventricular tachycardia) ([place]) 07/04/2015  . Melanoma of skin ([place]) 04/16/2015  . Atrial flutter ([place]) 12/07/2012  . Essential hypertension 11/07/2012  . Ascending aortic aneurysm ([place]) 11/07/2012  . ED (erectile dysfunction) 11/15/2011    COCHRAN,JENNIFER, PTA 12/19/2017, 9:30 AM  [facility] [address] [place], [place], [address] Phone: [phone]   Fax:  [phone]  Name: Lee Lee MRN: [id number] Date of Birth: [birth year]

## 2017-12-20 DIAGNOSIS — F1020 Alcohol dependence, uncomplicated: Secondary | ICD-10-CM | POA: Diagnosis not present

## 2017-12-21 ENCOUNTER — Ambulatory Visit: Payer: PPO

## 2017-12-21 DIAGNOSIS — R2689 Other abnormalities of gait and mobility: Secondary | ICD-10-CM

## 2017-12-21 DIAGNOSIS — M6281 Muscle weakness (generalized): Secondary | ICD-10-CM

## 2017-12-21 DIAGNOSIS — M25561 Pain in right knee: Secondary | ICD-10-CM | POA: Diagnosis not present

## 2017-12-21 DIAGNOSIS — M25661 Stiffness of right knee, not elsewhere classified: Secondary | ICD-10-CM

## 2017-12-21 DIAGNOSIS — R600 Localized edema: Secondary | ICD-10-CM

## 2017-12-21 NOTE — Therapy (Signed)
[facility] [address] [place], [place], [address] Phone: [phone]   Fax:  [phone]  Physical Therapy Treatment  Patient Details  Name: Lee Lee MRN: [id number] Date of Birth: July 13, 1951 Referring Provider: Dr. Rodell Perna   Encounter Date: 12/21/2017  PT End of Session - 12/21/17 0839    Visit Number  20    Date for PT Re-Evaluation  01/11/18    Authorization Type  BCBS    PT Start Time  0758    PT Stop Time  0840    PT Time Calculation (min)  42 min    Activity Tolerance  Patient tolerated treatment well;No increased pain    Behavior During Therapy  WFL for tasks assessed/performed       Past Medical History:  Diagnosis Date  . Alcohol abuse, episodic drinking behavior   . Arthritis    back & knees  . Atrial flutter ([place])    s/p RFCA 01/05/13  . Calcium oxalate renal stones   . Complication of anesthesia    makes him loopy  . ED (erectile dysfunction)   . Hemorrhoids   . Hypertension     Past Surgical History:  Procedure Laterality Date  . ABLATION OF DYSRHYTHMIC FOCUS  01/05/2013  . ARTHROSCOPIC REPAIR ACL    . ATRIAL FLUTTER ABLATION N/A 01/05/2013   Procedure: ATRIAL FLUTTER ABLATION;  Surgeon: Evans Lance, MD;  Location: [facility] CATH LAB;  Service: Cardiovascular;  Laterality: N/A;  . COLONOSCOPY  11/2009   Dr. Benson Norway  . ELECTROPHYSIOLOGIC STUDY N/A 07/04/2015   Procedure: A-Flutter Ablation;  Surgeon: Evans Lance, MD;  Location: [facility] [facility];  Service: Cardiovascular;  Laterality: N/A;  . REPLACEMENT TOTAL KNEE Right 09/12/2017   by dr Lorin Mercy at [facility]  . ROTATOR CUFF REPAIR      There were no vitals filed for this visit.  Subjective Assessment - 12/21/17 0820    Subjective  I still have Rt knee instability with walking    Pertinent History  Rt quad tendon repair: 09/12/17.      Currently in Pain?  No/denies         [facility] PT Assessment - 12/21/17 0001      Observation/Other Assessments   Focus on Therapeutic Outcomes (FOTO)   43% limitation                  [facility] Adult PT Treatment/Exercise - 12/21/17 0001      Neuro Re-ed    Neuro Re-ed Details   Single limb stance with red ball plyo toss 3x 20 then RTLE on black balance square for 2x20 with LT toes on floor Lt toes slightly on floor for balance, VC to contract quad>       Knee/Hip Exercises: Stretches   Active Hamstring Stretch  Right;4 reps;30 seconds Supine with strap    Gastroc Stretch  -- On slant board 3x 30 sec      Knee/Hip Exercises: Aerobic   Recumbent Bike  L4 x 10 min VC to push and pull intermittently      Knee/Hip Exercises: Machines for Strengthening   Cybex Knee Flexion  25# 2x10 RT > LT     Total Gym Leg Press  Seat 7 Bil 75# 20x, RTLE 50# 10x, 60# 10x      Knee/Hip Exercises: Standing   Forward Step Up  -- BOSU step ups 20x, light UE    Step Down  --  Walking with Sports Cord  35# 10x ea direction      Manual Therapy   Soft tissue mobilization  Instrument asst [place] blade techniques to RT hamstring and gastroc in prone performed by Myrene Galas, PTA               PT Short Term Goals - 10/03/17 0914      PT SHORT TERM GOAL #3   Title  demonstrate good quad set on the Rt to improve Rt LE stability with gait    Time  4    Period  Weeks    Status  Achieved        PT Long Term Goals - 12/21/17 1610      PT LONG TERM GOAL #1   Title  be independent in advanced HEP    Time  8    Period  Weeks    Status  On-going      PT LONG TERM GOAL #5   Title  report < or = to 2/10 Rt LE strength with standing and walking    Status  Achieved      PT LONG TERM GOAL #6   Title  ambulate without his right knee not giving way due to strength >/= 4/5 and minimal to no limp    Time  8    Period  Weeks    Status  On-going      PT LONG TERM GOAL #7   Title  abilty to go up and down steps with a step over step pattern due to improve eccentric  contraction of the right quadriceps     Status  Achieved            Plan - 12/21/17 0806    Clinical Impression Statement  Pt is now able to ascend and descend steps with step-over-step gait consistently.  Pt reports intermittent Rt quad/LE instability with gait.  Pt demonstrates instability with single limb activity and is challenged with single leg on foam pad and level surface.  Pt will continue to benefit from skilled PT for advancement of Rt LE stabilty, end range strength and proprioception.    Clinical Impairments Affecting Rehab Potential  Right quad tendon repair 09/12/2017; ROM and resistance exercises to right knee    PT Frequency  2x / week    PT Duration  8 weeks    PT Treatment/Interventions  ADLs/Self Care Home Management;Cryotherapy;Electrical Stimulation;Functional mobility training;Stair training;Moist Heat;Therapeutic activities;Therapeutic exercise;Neuromuscular re-education;Passive range of motion;Scar mobilization;Manual techniques;Taping;Vasopneumatic Device    PT Next Visit Plan  knee strength progressions, closed chain strengthening in low ROM; knee proprioception, posterior chain strengthening    Consulted and Agree with Plan of Care  Patient       Patient will benefit from skilled therapeutic intervention in order to improve the following deficits and impairments:  Abnormal gait, Difficulty walking, Pain, Decreased endurance, Decreased activity tolerance, Decreased range of motion, Increased edema, Decreased mobility  Visit Diagnosis: Acute pain of right knee  Stiffness of right knee, not elsewhere classified  Localized edema  Other abnormalities of gait and mobility  Muscle weakness (generalized)     Problem List Patient Active Problem List   Diagnosis Date Noted  . Rupture of right quadriceps tendon 10/18/2017  . Insomnia due to anxiety and fear 09/30/2016  . Restless leg syndrome, familial 09/30/2016  . Iron deficiency anemia 09/30/2016  .  Hypertriglyceridemia 06/15/2016  . Hyperlipidemia with target LDL less than 130 06/15/2016  . Hyperglycemia 06/15/2016  .  S/P radiofrequency ablation operation for arrhythmia 07/04/15 07/05/2015  . SVT (supraventricular tachycardia) ([place]) 07/04/2015  . Melanoma of skin ([place]) 04/16/2015  . Atrial flutter ([place]) 12/07/2012  . Essential hypertension 11/07/2012  . Ascending aortic aneurysm ([place]) 11/07/2012  . ED (erectile dysfunction) 11/15/2011     Sigurd Sos, PT 12/21/17 8:42 AM  [facility] [address] [place], [place], [address] Phone: [phone]   Fax:  [phone]  Name: Filimon Miranda MRN: [id number] Date of Birth: 1951/01/10

## 2017-12-26 ENCOUNTER — Encounter: Payer: Self-pay | Admitting: Physical Therapy

## 2017-12-26 ENCOUNTER — Ambulatory Visit: Payer: PPO | Admitting: Physical Therapy

## 2017-12-26 ENCOUNTER — Encounter: Payer: BLUE CROSS/BLUE SHIELD | Admitting: Physical Therapy

## 2017-12-26 DIAGNOSIS — R600 Localized edema: Secondary | ICD-10-CM

## 2017-12-26 DIAGNOSIS — M25561 Pain in right knee: Secondary | ICD-10-CM

## 2017-12-26 DIAGNOSIS — T1490XA Injury, unspecified, initial encounter: Secondary | ICD-10-CM | POA: Diagnosis not present

## 2017-12-26 DIAGNOSIS — R2689 Other abnormalities of gait and mobility: Secondary | ICD-10-CM

## 2017-12-26 DIAGNOSIS — M25661 Stiffness of right knee, not elsewhere classified: Secondary | ICD-10-CM

## 2017-12-26 DIAGNOSIS — M6281 Muscle weakness (generalized): Secondary | ICD-10-CM

## 2017-12-26 DIAGNOSIS — F1020 Alcohol dependence, uncomplicated: Secondary | ICD-10-CM | POA: Diagnosis not present

## 2017-12-26 NOTE — Therapy (Signed)
[facility] [address] [place], [place], [address] Phone: [phone]   Fax:  [phone]  Physical Therapy Treatment  Patient Details  Name: Lee Lee MRN: [id number] Date of Birth: January 17, 1951 Referring Provider: Dr. Rodell Perna   Encounter Date: 12/26/2017  PT End of Session - 12/26/17 1144    Visit Number  21    Date for PT Re-Evaluation  01/11/18    Authorization Type  BCBS    PT Start Time  7564    PT Stop Time  1230    PT Time Calculation (min)  46 min    Activity Tolerance  Patient tolerated treatment well;No increased pain    Behavior During Therapy  WFL for tasks assessed/performed       Past Medical History:  Diagnosis Date  . Alcohol abuse, episodic drinking behavior   . Arthritis    back & knees  . Atrial flutter ([place])    s/p RFCA 01/05/13  . Calcium oxalate renal stones   . Complication of anesthesia    makes him loopy  . ED (erectile dysfunction)   . Hemorrhoids   . Hypertension     Past Surgical History:  Procedure Laterality Date  . ABLATION OF DYSRHYTHMIC FOCUS  01/05/2013  . ARTHROSCOPIC REPAIR ACL    . ATRIAL FLUTTER ABLATION N/A 01/05/2013   Procedure: ATRIAL FLUTTER ABLATION;  Surgeon: Evans Lance, MD;  Location: [facility] CATH LAB;  Service: Cardiovascular;  Laterality: N/A;  . COLONOSCOPY  11/2009   Dr. Benson Norway  . ELECTROPHYSIOLOGIC STUDY N/A 07/04/2015   Procedure: A-Flutter Ablation;  Surgeon: Evans Lance, MD;  Location: [place];  Service: Cardiovascular;  Laterality: N/A;  . REPLACEMENT TOTAL KNEE Right 09/12/2017   by dr Lorin Mercy at [facility]  . ROTATOR CUFF REPAIR      There were no vitals filed for this visit.  Subjective Assessment - 12/26/17 1147    Subjective  My knee is super tight and soreness in my hamstrings. Pt reports doing a lot over the weekend including playing some golf. Knee is moderately swollen today.     Pertinent History  Rt quad tendon  repair: 09/12/17.      Currently in Pain?  Yes The 6/10 is more tightness with sore hamstrings.    Pain Score  6     Aggravating Factors   Not sure    Pain Relieving Factors  rest, heat, massage    Multiple Pain Sites  No                      [facility] Adult PT Treatment/Exercise - 12/26/17 0001      Knee/Hip Exercises: Stretches   Active Hamstring Stretch  Right;4 reps;30 seconds Supine with strap    Gastroc Stretch  -- On slant board 3x 30 sec      Knee/Hip Exercises: Aerobic   Recumbent Bike  L3 x 10 min PTA present and discussed ways to reduce his overall exercis      Knee/Hip Exercises: Seated   Long Arc Quad  Strengthening;Right;3 sets;10 reps;Weights    Long Arc Quad Weight  5 lbs.      Vasopneumatic   Number Minutes Vasopneumatic   15 minutes    Vasopnuematic Location   Knee    Vasopneumatic Pressure  Medium    Vasopneumatic Temperature   3 flakes      Manual Therapy   Soft tissue mobilization  Quads, hams  with retrograde edema techniques for swelling               PT Short Term Goals - 10/03/17 0914      PT SHORT TERM GOAL #3   Title  demonstrate good quad set on the Rt to improve Rt LE stability with gait    Time  4    Period  Weeks    Status  Achieved        PT Long Term Goals - 12/21/17 3845      PT LONG TERM GOAL #1   Title  be independent in advanced HEP    Time  8    Period  Weeks    Status  On-going      PT LONG TERM GOAL #5   Title  report < or = to 2/10 Rt LE strength with standing and walking    Status  Achieved      PT LONG TERM GOAL #6   Title  ambulate without his right knee not giving way due to strength >/= 4/5 and minimal to no limp    Time  8    Period  Weeks    Status  On-going      PT LONG TERM GOAL #7   Title  abilty to go up and down steps with a step over step pattern due to improve eccentric contraction of the right quadriceps     Status  Achieved            Plan - 12/26/17 1212    Clinical  Impression Statement  Pt a bit frustrated today as his knee was pretty tight and swollen. After some discussion about what he has been doing over the last few days it appears he may be overdoinghis activilty. Once we did some stretching, bike, and soft tissue work to the RTLE he reported it felt much better. We also used the GAme Ready again at the end of the session to help reduce the swelling.     PT Next Visit Plan  Pt will reduce the resistance he is using at the gym for a few days and not do repetitive activities that load the knee like leg pressing and playing golf in the same day. Resume weightbearing quad and RTLE strengthening and hamstring stretching next session.     Consulted and Agree with Plan of Care  Patient       Patient will benefit from skilled therapeutic intervention in order to improve the following deficits and impairments:  Abnormal gait, Difficulty walking, Pain, Decreased endurance, Decreased activity tolerance, Decreased range of motion, Increased edema, Decreased mobility  Visit Diagnosis: Acute pain of right knee  Stiffness of right knee, not elsewhere classified  Localized edema  Other abnormalities of gait and mobility  Muscle weakness (generalized)     Problem List Patient Active Problem List   Diagnosis Date Noted  . Rupture of right quadriceps tendon 10/18/2017  . Insomnia due to anxiety and fear 09/30/2016  . Restless leg syndrome, familial 09/30/2016  . Iron deficiency anemia 09/30/2016  . Hypertriglyceridemia 06/15/2016  . Hyperlipidemia with target LDL less than 130 06/15/2016  . Hyperglycemia 06/15/2016  . S/P radiofrequency ablation operation for arrhythmia 07/04/15 07/05/2015  . SVT (supraventricular tachycardia) ([place]) 07/04/2015  . Melanoma of skin ([place]) 04/16/2015  . Atrial flutter ([place]) 12/07/2012  . Essential hypertension 11/07/2012  . Ascending aortic aneurysm ([place]) 11/07/2012  . ED (erectile dysfunction) 11/15/2011     Tristain Daily, PTA 12/26/2017,  12:17 PM  [facility] [address] [place], [place], [address] Phone: [phone]   Fax:  [phone]  Name: Lee Lee MRN: [id number] Date of Birth: [birth year]

## 2017-12-27 DIAGNOSIS — T1490XA Injury, unspecified, initial encounter: Secondary | ICD-10-CM | POA: Diagnosis not present

## 2017-12-28 ENCOUNTER — Encounter: Payer: Self-pay | Admitting: Physical Therapy

## 2017-12-28 ENCOUNTER — Ambulatory Visit: Payer: PPO | Admitting: Physical Therapy

## 2017-12-28 DIAGNOSIS — M25661 Stiffness of right knee, not elsewhere classified: Secondary | ICD-10-CM

## 2017-12-28 DIAGNOSIS — R600 Localized edema: Secondary | ICD-10-CM

## 2017-12-28 DIAGNOSIS — M25561 Pain in right knee: Secondary | ICD-10-CM | POA: Diagnosis not present

## 2017-12-28 DIAGNOSIS — R2689 Other abnormalities of gait and mobility: Secondary | ICD-10-CM

## 2017-12-28 DIAGNOSIS — M6281 Muscle weakness (generalized): Secondary | ICD-10-CM

## 2017-12-28 NOTE — Therapy (Signed)
[facility] [address], [place], [place], [address] Phone: [phone]   Fax:  [phone]  Physical Therapy Treatment  Patient Details  Name: Lee Lee MRN: [id number] Date of Birth: 31-Jul-1951 Referring Provider: Dr. Rodell Perna   Encounter Date: 12/28/2017  PT End of Session - 12/28/17 0849    Visit Number  22    Date for PT Re-Evaluation  01/11/18    Authorization Type  BCBS    PT Start Time  0848    PT Stop Time  0945    PT Time Calculation (min)  57 min    Activity Tolerance  Patient tolerated treatment well;No increased pain    Behavior During Therapy  WFL for tasks assessed/performed       Past Medical History:  Diagnosis Date  . Alcohol abuse, episodic drinking behavior   . Arthritis    back & knees  . Atrial flutter ([place])    s/p RFCA 01/05/13  . Calcium oxalate renal stones   . Complication of anesthesia    makes him loopy  . ED (erectile dysfunction)   . Hemorrhoids   . Hypertension     Past Surgical History:  Procedure Laterality Date  . ABLATION OF DYSRHYTHMIC FOCUS  01/05/2013  . ARTHROSCOPIC REPAIR ACL    . ATRIAL FLUTTER ABLATION N/A 01/05/2013   Procedure: ATRIAL FLUTTER ABLATION;  Surgeon: Evans Lance, MD;  Location: [facility] CATH LAB;  Service: Cardiovascular;  Laterality: N/A;  . COLONOSCOPY  11/2009   Dr. Benson Norway  . ELECTROPHYSIOLOGIC STUDY N/A 07/04/2015   Procedure: A-Flutter Ablation;  Surgeon: Evans Lance, MD;  Location: [place] CV LAB;  Service: Cardiovascular;  Laterality: N/A;  . REPLACEMENT TOTAL KNEE Right 09/12/2017   by dr Lorin Mercy at [facility]  . ROTATOR CUFF REPAIR      There were no vitals filed for this visit.  Subjective Assessment - 12/28/17 0851    Subjective  I took yesterday off in terms of exercise, and I feel so much better.     Pertinent History  Rt quad tendon repair: 09/12/17.      Currently in Pain?  No/denies    Multiple Pain Sites  No                       [facility] Adult PT Treatment/Exercise - 12/28/17 0001      Knee/Hip Exercises: Stretches   Active Hamstring Stretch  Right;4 reps;30 seconds Supine with strap    Gastroc Stretch  -- On slant board 3x 30 sec      Knee/Hip Exercises: Aerobic   Recumbent Bike  L3 x 10 min PTA present and discussed ways to reduce his overall exercis      Knee/Hip Exercises: Machines for Strengthening   Total Gym Leg Press  Seat 7 RTLE 60# 3x10      Knee/Hip Exercises: Standing   Wall Squat  1 set;5 reps;5 seconds Ball squeeze for VMO recruitment    Walking with Sports Cord  35# 10x ea direction      Knee/Hip Exercises: Seated   Long Arc Quad  Strengthening;Right;3 sets;10 reps;Weights    Long Arc Quad Weight  5 lbs.      Vasopneumatic   Number Minutes Vasopneumatic   15 minutes    Vasopnuematic Location   Knee    Vasopneumatic Pressure  Medium    Vasopneumatic Temperature   3 flakes      Manual Therapy  Soft tissue mobilization  Quads, hams with retrograde edema techniques for swelling               PT Short Term Goals - 10/03/17 0914      PT SHORT TERM GOAL #3   Title  demonstrate good quad set on the Rt to improve Rt LE stability with gait    Time  4    Period  Weeks    Status  Achieved        PT Long Term Goals - 12/28/17 6568      PT LONG TERM GOAL #6   Title  ambulate without his right knee not giving way due to strength >/= 4/5 and minimal to no limp    Time  8    Period  Weeks    Status  On-going Still gives way occasionally although much less than prior            Plan - 12/28/17 0849    Clinical Impression Statement  Pt reduced his work load over the last two days and the results have been very positive. He is much less sore and his gait is significantly better. He ambulated into the clinic without much of a limp this AM.  Edema was down from Monday, pt resumed his prior quad strengthening program which we added wall squats to  almost 90 degrees with ball squeeze for VMO. These were very challenging for pt.    Rehab Potential  Good    Clinical Impairments Affecting Rehab Potential  Right quad tendon repair 09/12/2017; ROM and resistance exercises to right knee    PT Frequency  2x / week    PT Duration  8 weeks    PT Treatment/Interventions  ADLs/Self Care Home Management;Cryotherapy;Electrical Stimulation;Functional mobility training;Stair training;Moist Heat;Therapeutic activities;Therapeutic exercise;Neuromuscular re-education;Passive range of motion;Scar mobilization;Manual techniques;Taping;Vasopneumatic Device    PT Next Visit Plan  Knee strength and flexibility, both open and closed chain.    Consulted and Agree with Plan of Care  --       Patient will benefit from skilled therapeutic intervention in order to improve the following deficits and impairments:  Abnormal gait, Difficulty walking, Pain, Decreased endurance, Decreased activity tolerance, Decreased range of motion, Increased edema, Decreased mobility  Visit Diagnosis: Acute pain of right knee  Stiffness of right knee, not elsewhere classified  Localized edema  Other abnormalities of gait and mobility  Muscle weakness (generalized)     Problem List Patient Active Problem List   Diagnosis Date Noted  . Rupture of right quadriceps tendon 10/18/2017  . Insomnia due to anxiety and fear 09/30/2016  . Restless leg syndrome, familial 09/30/2016  . Iron deficiency anemia 09/30/2016  . Hypertriglyceridemia 06/15/2016  . Hyperlipidemia with target LDL less than 130 06/15/2016  . Hyperglycemia 06/15/2016  . S/P radiofrequency ablation operation for arrhythmia 07/04/15 07/05/2015  . SVT (supraventricular tachycardia) ([place]) 07/04/2015  . Melanoma of skin ([place]) 04/16/2015  . Atrial flutter ([place]) 12/07/2012  . Essential hypertension 11/07/2012  . Ascending aortic aneurysm ([place]) 11/07/2012  . ED (erectile dysfunction) 11/15/2011     COCHRAN,JENNIFER, PTA 12/28/2017, 9:31 AM  [facility] [address] [place], [place], [address] Phone: [phone]   Fax:  [phone]  Name: Lee Lee MRN: [id number] Date of Birth: [birth year]

## 2018-01-02 ENCOUNTER — Ambulatory Visit: Payer: PPO | Attending: Orthopaedic Surgery | Admitting: Physical Therapy

## 2018-01-02 DIAGNOSIS — M25561 Pain in right knee: Secondary | ICD-10-CM | POA: Insufficient documentation

## 2018-01-02 DIAGNOSIS — M25661 Stiffness of right knee, not elsewhere classified: Secondary | ICD-10-CM | POA: Insufficient documentation

## 2018-01-02 DIAGNOSIS — M6281 Muscle weakness (generalized): Secondary | ICD-10-CM | POA: Insufficient documentation

## 2018-01-02 DIAGNOSIS — R600 Localized edema: Secondary | ICD-10-CM | POA: Insufficient documentation

## 2018-01-02 DIAGNOSIS — R2689 Other abnormalities of gait and mobility: Secondary | ICD-10-CM | POA: Insufficient documentation

## 2018-01-03 DIAGNOSIS — T1490XA Injury, unspecified, initial encounter: Secondary | ICD-10-CM | POA: Diagnosis not present

## 2018-01-04 ENCOUNTER — Encounter: Payer: Self-pay | Admitting: Physical Therapy

## 2018-01-04 ENCOUNTER — Ambulatory Visit: Payer: PPO | Admitting: Physical Therapy

## 2018-01-04 DIAGNOSIS — R2689 Other abnormalities of gait and mobility: Secondary | ICD-10-CM | POA: Diagnosis not present

## 2018-01-04 DIAGNOSIS — M25561 Pain in right knee: Secondary | ICD-10-CM

## 2018-01-04 DIAGNOSIS — M6281 Muscle weakness (generalized): Secondary | ICD-10-CM

## 2018-01-04 DIAGNOSIS — M25661 Stiffness of right knee, not elsewhere classified: Secondary | ICD-10-CM | POA: Diagnosis not present

## 2018-01-04 DIAGNOSIS — R600 Localized edema: Secondary | ICD-10-CM | POA: Diagnosis not present

## 2018-01-04 NOTE — Therapy (Signed)
[facility] [address], [place], [place], [address] Phone: [phone]   Fax:  [phone]  Physical Therapy Treatment  Patient Details  Name: Lee Lee MRN: [id number] Date of Birth: Mar 24, 1951 Referring Provider: Dr. Rodell Perna   Encounter Date: 01/04/2018  PT End of Session - 01/04/18 0843    Visit Number  23    Date for PT Re-Evaluation  01/11/18    Authorization Type  BC[employer]    PT Start Time  0830    PT Stop Time  0925    PT Time Calculation (min)  55 min    Activity Tolerance  Patient tolerated treatment well;No increased pain    Behavior During Therapy  WFL for tasks assessed/performed       Past Medical History:  Diagnosis Date  . Alcohol abuse, episodic drinking behavior   . Arthritis    back & knees  . Atrial flutter ([place])    s/p RFCA 01/05/13  . Calcium oxalate renal stones   . Complication of anesthesia    makes him loopy  . ED (erectile dysfunction)   . Hemorrhoids   . Hypertension     Past Surgical History:  Procedure Laterality Date  . ABLATION OF DYSRHYTHMIC FOCUS  01/05/2013  . ARTHROSCOPIC REPAIR ACL    . ATRIAL FLUTTER ABLATION N/A 01/05/2013   Procedure: ATRIAL FLUTTER ABLATION;  Surgeon: Evans Lance, MD;  [facility]: [facility] CATH LAB;  Service: Cardiovascular;  Laterality: N/A;  . COLONOSCOPY  11/2009   Dr. Benson Norway  . ELECTROPHYSIOLOGIC STUDY N/A 07/04/2015   Procedure: A-Flutter Ablation;  Surgeon: Evans Lance, MD;  Location: [place] CV LAB;  Service: Cardiovascular;  Laterality: N/A;  . REPLACEMENT TOTAL KNEE Right 09/12/2017   by dr Lorin Mercy at [facility]  . ROTATOR CUFF REPAIR      There were no vitals filed for this visit.  Subjective Assessment - 01/04/18 0847    Subjective  I played golf Saturday and my knee/leg felt good. Some instability walking the hills but nothing significant. Pain has been much better.    Pertinent History  Rt quad tendon repair: 09/12/17.       Currently in Pain?  No/denies    Multiple Pain Sites  No                      [facility] Adult PT Treatment/Exercise - 01/04/18 0001      Knee/Hip Exercises: Aerobic   Elliptical  L3 2 min warm up with Bil UE on bars: then no hands for 30 sec , 1 min restwith hands on bars, then repeat    Recumbent Bike  L3 x 10 min PTA present and discussed ways to reduce his overall exercis      Knee/Hip Exercises: Machines for Strengthening   Cybex Knee Flexion  RT > LTR 2x 10 20#    Total Gym Leg Press  Seat 7 RTLE  60# 10x, 65# 10x 2      Knee/Hip Exercises: Standing   Wall Squat  1 set;10 reps Ball squeeze concurrent    Rebounder   Red ball plyo toss     Walking with Sports Cord  35# 10x ea direction      Vasopneumatic   Number Minutes Vasopneumatic   15 minutes    Vasopnuematic Location   Knee    Vasopneumatic Pressure  Medium    Vasopneumatic Temperature   3 flakes  Manual Therapy   Soft tissue mobilization  Quads, hams with retrograde edema techniques for swelling               PT Short Term Goals - 10/03/17 0914      PT SHORT TERM GOAL #3   Title  demonstrate good quad set on the Rt to improve Rt LE stability with gait    Time  4    Period  Weeks    Status  Achieved        PT Long Term Goals - 01/04/18 0845      PT LONG TERM GOAL #6   Title  ambulate without his right knee not giving way due to strength >/= 4/5 and minimal to no limp    Time  8    Period  Weeks    Status  On-going Only 1-2x a week the knee gives way            Plan - 01/04/18 0844    Clinical Impression Statement  Pt reports he has found a better balance between exercise, rest, and his recreational activities. He played golf Saturday and had no pain. He had mild swelling only after and today. His biggest complaint is the instability on unlevel surfaces. His quads visably shake during the PT session and fatigue pretty quickly. Still requires assistance of the LTLE in single limb  stance dynamic activities.     Rehab Potential  Good    Clinical Impairments Affecting Rehab Potential  Right quad tendon repair 09/12/2017; ROM and resistance exercises to right knee    PT Frequency  2x / week    PT Duration  8 weeks    PT Treatment/Interventions  ADLs/Self Care Home Management;Cryotherapy;Electrical Stimulation;Functional mobility training;Stair training;Moist Heat;Therapeutic activities;Therapeutic exercise;Neuromuscular re-education;Passive range of motion;Scar mobilization;Manual techniques;Taping;Vasopneumatic Device    PT Next Visit Plan  Knee strength and flexibility, both open and closed chain.    Consulted and Agree with Plan of Care  Patient       Patient will benefit from skilled therapeutic intervention in order to improve the following deficits and impairments:  Abnormal gait, Difficulty walking, Pain, Decreased endurance, Decreased activity tolerance, Decreased range of motion, Increased edema, Decreased mobility  Visit Diagnosis: Acute pain of right knee  Stiffness of right knee, not elsewhere classified  Localized edema  Other abnormalities of gait and mobility  Muscle weakness (generalized)     Problem List Patient Active Problem List   Diagnosis Date Noted  . Rupture of right quadriceps tendon 10/18/2017  . Insomnia due to anxiety and fear 09/30/2016  . Restless leg syndrome, familial 09/30/2016  . Iron deficiency anemia 09/30/2016  . Hypertriglyceridemia 06/15/2016  . Hyperlipidemia with target LDL less than 130 06/15/2016  . Hyperglycemia 06/15/2016  . S/P radiofrequency ablation operation for arrhythmia 07/04/15 07/05/2015  . SVT (supraventricular tachycardia) ([place]) 07/04/2015  . Melanoma of skin ([place]) 04/16/2015  . Atrial flutter ([place]) 12/07/2012  . Essential hypertension 11/07/2012  . Ascending aortic aneurysm ([place]) 11/07/2012  . ED (erectile dysfunction) 11/15/2011    Lee Lee, PTA 01/04/2018, 9:17 AM  [facility] [address], [place], [place], [address] Phone: [phone]   Fax:  [phone]  Name: Lee Lee MRN: [id number] Date of Birth: 12-24-1950

## 2018-01-09 ENCOUNTER — Ambulatory Visit: Payer: PPO | Admitting: Physical Therapy

## 2018-01-10 DIAGNOSIS — F1020 Alcohol dependence, uncomplicated: Secondary | ICD-10-CM | POA: Diagnosis not present

## 2018-01-11 ENCOUNTER — Ambulatory Visit: Payer: PPO

## 2018-01-11 DIAGNOSIS — M25661 Stiffness of right knee, not elsewhere classified: Secondary | ICD-10-CM

## 2018-01-11 DIAGNOSIS — R600 Localized edema: Secondary | ICD-10-CM

## 2018-01-11 DIAGNOSIS — R2689 Other abnormalities of gait and mobility: Secondary | ICD-10-CM

## 2018-01-11 DIAGNOSIS — M25561 Pain in right knee: Secondary | ICD-10-CM | POA: Diagnosis not present

## 2018-01-11 DIAGNOSIS — M6281 Muscle weakness (generalized): Secondary | ICD-10-CM

## 2018-01-11 NOTE — Therapy (Signed)
[facility] [address] [place], [place], [address] Phone: [phone]   Fax:  [phone]  Physical Therapy Treatment  Patient Details  Name: Lee Lee MRN: [id number] Date of Birth: 12/02/1950 Referring Provider: Dr. Rodell Perna   Encounter Date: 01/11/2018  PT End of Session - 01/11/18 0918    Visit Number  24    PT Start Time  0848    PT Stop Time  0920    PT Time Calculation (min)  32 min    Activity Tolerance  Patient tolerated treatment well    Behavior During Therapy  [facility] for tasks assessed/performed       Past Medical History:  Diagnosis Date  . Alcohol abuse, episodic drinking behavior   . Arthritis    back & knees  . Atrial flutter ([place])    s/p RFCA 01/05/13  . Calcium oxalate renal stones   . Complication of anesthesia    makes him loopy  . ED (erectile dysfunction)   . Hemorrhoids   . Hypertension     Past Surgical History:  Procedure Laterality Date  . ABLATION OF DYSRHYTHMIC FOCUS  01/05/2013  . ARTHROSCOPIC REPAIR ACL    . ATRIAL FLUTTER ABLATION N/A 01/05/2013   Procedure: ATRIAL FLUTTER ABLATION;  Surgeon: Evans Lance, MD;  Location: [facility] CATH LAB;  Service: Cardiovascular;  Laterality: N/A;  . COLONOSCOPY  11/2009   Dr. Benson Norway  . ELECTROPHYSIOLOGIC STUDY N/A 07/04/2015   Procedure: A-Flutter Ablation;  Surgeon: Evans Lance, MD;  Location: [place] CV LAB;  Service: Cardiovascular;  Laterality: N/A;  . REPLACEMENT TOTAL KNEE Right 09/12/2017   by dr Lorin Mercy at [facility]  . ROTATOR CUFF REPAIR      There were no vitals filed for this visit.  Subjective Assessment - 01/11/18 0852    Subjective  I still haven't done any running.  Not sure if I am able to do this.      Pertinent History  Rt quad tendon repair: 09/12/17.      Currently in Pain?  No/denies         [facility] PT Assessment - 01/11/18 0001      Assessment   Medical Diagnosis  Rt quad tendon rupture       Observation/Other Assessments   Focus on Therapeutic Outcomes (FOTO)   34% limitation      AROM   Right Knee Extension  4    Right Knee Flexion  130      Strength   Right Knee Flexion  4+/5    Right Knee Extension  4+/5                  [facility] Adult PT Treatment/Exercise - 01/11/18 0001      Knee/Hip Exercises: Aerobic   Recumbent Bike  L3 x 10 min PT present to discuss exercise after D/C      Knee/Hip Exercises: Machines for Strengthening   Cybex Knee Flexion  RT > LTR 2x 10 20#    Total Gym Leg Press  Seat 7 RTLE  65# 3x10      Knee/Hip Exercises: Standing   Rebounder   Red ball plyo toss     Walking with Sports Cord  35# 10x ea direction      Vasopneumatic   Number Minutes Vasopneumatic   15 minutes               PT Short Term Goals - 10/03/17 0160  PT SHORT TERM GOAL #3   Title  demonstrate good quad set on the Rt to improve Rt LE stability with gait    Time  4    Period  Weeks    Status  Achieved        PT Long Term Goals - 01/11/18 0854      PT LONG TERM GOAL #1   Title  be independent in advanced HEP    Status  Achieved      PT LONG TERM GOAL #2   Title  reduce FOTO to < or = to 35% limitation    Baseline  34%    Status  Achieved      PT LONG TERM GOAL #3   Title  demonstrate Rt knee P/ROM flexion to > or = to 95 degrees to allow for sitting without substitution    Baseline  130    Status  Achieved      PT LONG TERM GOAL #4   Title  demonstrate 4+/5 Rt quad strength to improve stabilization with gait    Baseline  4+/5     Status  Achieved      PT LONG TERM GOAL #5   Title  report < or = to 2/10 Rt LE strength with standing and walking    Status  Achieved      PT LONG TERM GOAL #6   Title  ambulate without his right knee not giving way due to strength >/= 4/5 and minimal to no limp    Status  Achieved      PT LONG TERM GOAL #7   Title  abilty to go up and down steps with a step over step pattern due to improve  eccentric contraction of the right quadriceps     Baseline  slowly with hand rail    Status  Achieved            Plan - 01/11/18 0911    Clinical Impression Statement  Pt will D/C to HEP today.  Pt has met all goals and will continue to work on Rt LE and quad strength to improve stability with unlevel surfaces and descending steps.  Pt will discuss return to tennis and running with MD.      PT Next Visit Plan  D/C PT to HEP    Consulted and Agree with Plan of Care  Patient       Patient will benefit from skilled therapeutic intervention in order to improve the following deficits and impairments:     Visit Diagnosis: Acute pain of right knee  Stiffness of right knee, not elsewhere classified  Localized edema  Other abnormalities of gait and mobility  Muscle weakness (generalized)     Problem List Patient Active Problem List   Diagnosis Date Noted  . Rupture of right quadriceps tendon 10/18/2017  . Insomnia due to anxiety and fear 09/30/2016  . Restless leg syndrome, familial 09/30/2016  . Iron deficiency anemia 09/30/2016  . Hypertriglyceridemia 06/15/2016  . Hyperlipidemia with target LDL less than 130 06/15/2016  . Hyperglycemia 06/15/2016  . S/P radiofrequency ablation operation for arrhythmia 07/04/15 07/05/2015  . SVT (supraventricular tachycardia) ([facility]) 07/04/2015  . Melanoma of skin ([facility]) 04/16/2015  . Atrial flutter ([facility]) 12/07/2012  . Essential hypertension 11/07/2012  . Ascending aortic aneurysm ([facility]) 11/07/2012  . ED (erectile dysfunction) 11/15/2011   PHYSICAL THERAPY DISCHARGE SUMMARY  Visits from Start of Care: 24  Current functional level related to goals / functional outcomes:   See above for current status.  Pt will continue to address Rt LE strength, stability and endurance.     Remaining deficits: Rt quad weakness and reduced eccentric control when fatigued.  Pt will continue to work on strength at home.     Education /  Equipment: HEP Plan: Patient agrees to discharge.  Patient goals were met. Patient is being discharged due to meeting the stated rehab goals.  ?????        Kelly Takacs, PT 01/11/18 9:21 AM  [facility] [address], [address] [place], [place], [address] Phone: [phone]   Fax:  [phone]  Name: Harlee Schwalbe MRN: [id number] Date of Birth: 02/20/1951   

## 2018-01-13 ENCOUNTER — Encounter: Payer: PPO | Admitting: Physical Therapy

## 2018-01-17 DIAGNOSIS — F1020 Alcohol dependence, uncomplicated: Secondary | ICD-10-CM | POA: Diagnosis not present

## 2018-01-23 ENCOUNTER — Telehealth: Payer: Self-pay | Admitting: Internal Medicine

## 2018-01-23 NOTE — Telephone Encounter (Signed)
Copied from [place]. Topic: Quick Communication - See Telephone Encounter >> Jan 23, 2018  4:58 PM Lee, Lee Emperor wrote: CRM for notification. See Telephone encounter for: Lee Lee with [facility] states pt insurance will no longer cover his EDARBYCLOR 40-12.5 MG TABS [[id number]]. They would like to know if another Rx could be called in.   They will cover: losartan and candesartan  Lee Lee also states pt is going out of town tomorrow and will need this medication by then.   01/23/18.

## 2018-01-24 ENCOUNTER — Other Ambulatory Visit: Payer: Self-pay | Admitting: Internal Medicine

## 2018-01-24 DIAGNOSIS — I10 Essential (primary) hypertension: Secondary | ICD-10-CM

## 2018-01-24 MED ORDER — LOSARTAN POTASSIUM-HCTZ 100-12.5 MG PO TABS: 1.0000 | ORAL_TABLET | Freq: Every day | ORAL | 1 refills | Status: DC

## 2018-01-24 NOTE — Telephone Encounter (Signed)
New rx sent

## 2018-03-08 ENCOUNTER — Ambulatory Visit (INDEPENDENT_AMBULATORY_CARE_PROVIDER_SITE_OTHER): Payer: BLUE CROSS/BLUE SHIELD | Admitting: Orthopaedic Surgery

## 2018-03-08 ENCOUNTER — Encounter (INDEPENDENT_AMBULATORY_CARE_PROVIDER_SITE_OTHER): Payer: Self-pay | Admitting: Orthopaedic Surgery

## 2018-03-08 VITALS — BP 159/83 | HR 63 | Ht 71.0 in | Wt 225.0 lb

## 2018-03-08 DIAGNOSIS — M1712 Unilateral primary osteoarthritis, left knee: Secondary | ICD-10-CM

## 2018-03-08 MED ORDER — LIDOCAINE HCL 1 % IJ SOLN
0.5000 mL | INTRAMUSCULAR | Status: AC | PRN
  Administered 2018-03-08: .5 mL

## 2018-03-08 MED ORDER — METHYLPREDNISOLONE ACETATE 40 MG/ML IJ SUSP
40.0000 mg | INTRAMUSCULAR | Status: AC | PRN
  Administered 2018-03-08: 40 mg via INTRA_ARTICULAR

## 2018-03-08 MED ORDER — BUPIVACAINE HCL 0.5 % IJ SOLN
3.0000 mL | INTRAMUSCULAR | Status: AC | PRN
  Administered 2018-03-08: 3 mL via INTRA_ARTICULAR

## 2018-03-08 NOTE — Progress Notes (Signed)
Office Visit Note   Patient: Lee Lee           Date of Birth: Jun 29, 1951           MRN: [id number] Visit Date: 03/08/2018              Requested by: Janith Lima, MD [address] [place], [place] [address] PCP: Janith Lima, MD   Assessment & Plan: Visit Diagnoses:  1. Unilateral primary osteoarthritis, left knee     Plan: Done a good job with rehab after his right quad tendon repair.  Bone-on-bone changes left knee performed at his request.  I plan to recheck him in 3 months seed with left total knee arthroplasty in the fall.  Discussed specific quad strengthening exercises he can do to maintain his knee without aggravating his knee symptoms.  Follow-Up Instructions: Return in about 3 months (around 06/07/2018).   Orders:  Orders Placed This Encounter  Procedures  . Large Joint Inj: L knee   No orders of the defined types were placed in this encounter.     Procedures: Large Joint Inj: L knee on 03/08/2018 9:35 AM Indications: joint swelling and pain Details: 22 G 1.5 in needle, anterolateral approach  Arthrogram: No  Medications: 0.5 mL lidocaine 1 %; 3 mL bupivacaine 0.5 %; 40 mg methylPREDNISolone acetate 40 MG/ML Outcome: tolerated well, no immediate complications Procedure, treatment alternatives, risks and benefits explained, specific risks discussed. Consent was given by the patient. Immediately prior to procedure a time out was called to verify the correct patient, procedure, equipment, support staff and site/side marked as required. Patient was prepped and draped in the usual sterile fashion.       Clinical Data: No additional findings.   Subjective: Chief Complaint  Patient presents with  . Left Knee - Pain, Edema, Follow-up    S/p knee aspiration/injection 09/09/17    HPI 67 year old male returns post right quad tendon repair with problems with his left knee osteoarthritis tricompartmental.  Patient states when he plays golf  he has to sometimes go sideways down the heel due to the left knee pain and giving way.  Abrasion an injection in October gave him temporary relief and patient states she like another injection done today and then would like to proceed with left total knee arthroplasty in October or November.  Review of Systems reviewed and updated unchanged from his quadriceps tendon repair 4 months ago other than as mentioned in HPI.  Point review of systems updated.   Objective: Vital Signs: BP (!) 159/83 (BP Location: Right Arm, Patient Position: Sitting, Cuff Size: Large)   Pulse 63   Ht 5' 11"  (1.803 m)   Wt 225 lb (102.1 kg)   BMI 31.38 kg/m   Physical Exam  Constitutional: He is oriented to person, place, and time. He appears well-developed and well-nourished.  HENT:  Head: Normocephalic and atraumatic.  Eyes: Pupils are equal, round, and reactive to light. EOM are normal.  Neck: No tracheal deviation present. No thyromegaly present.  Cardiovascular: Normal rate.  Pulmonary/Chest: Effort normal. He has no wheezes.  Abdominal: Soft. Bowel sounds are normal.  Neurological: He is alert and oriented to person, place, and time.  Skin: Skin is warm and dry. Capillary refill takes less than 2 seconds.  Psychiatric: He has a normal mood and affect. His behavior is normal. Judgment and thought content normal.    Ortho Exam patient has 2+ knee effusion on the left crepitus with  knee range of motion palpable tender medial osteophytes.  He is in mild varus.  He lacks 8-10 degrees reaching full extension both knees.  No palpable defect in the right quad tendon repair well-healed midline incision right distal anterior thigh.  Distal pulses are intact no sciatic notch tenderness.  No calf tenderness.  Tib gastrocsoleus is intact.   Specialty Comments:  No specialty comments available.  Imaging: No results found.   PMFS History: Patient Active Problem List   Diagnosis Date Noted  . Unilateral primary  osteoarthritis, left knee 03/08/2018  . Rupture of right quadriceps tendon 10/18/2017  . Insomnia due to anxiety and fear 09/30/2016  . Restless leg syndrome, familial 09/30/2016  . Iron deficiency anemia 09/30/2016  . Hypertriglyceridemia 06/15/2016  . Hyperlipidemia with target LDL less than 130 06/15/2016  . Hyperglycemia 06/15/2016  . S/P radiofrequency ablation operation for arrhythmia 07/04/15 07/05/2015  . SVT (supraventricular tachycardia) ([place]) 07/04/2015  . Melanoma of skin ([place]) 04/16/2015  . Atrial flutter ([place]) 12/07/2012  . Essential hypertension 11/07/2012  . Ascending aortic aneurysm ([place]) 11/07/2012  . ED (erectile dysfunction) 11/15/2011   Past Medical History:  Diagnosis Date  . Alcohol abuse, episodic drinking behavior   . Arthritis    back & knees  . Atrial flutter ([place])    s/p RFCA 01/05/13  . Calcium oxalate renal stones   . Complication of anesthesia    makes him loopy  . ED (erectile dysfunction)   . Hemorrhoids   . Hypertension     Family History  Problem Relation Age of Onset  . Valvular heart disease Mother   . Lymphoma Mother     Past Surgical History:  Procedure Laterality Date  . ABLATION OF DYSRHYTHMIC FOCUS  01/05/2013  . ARTHROSCOPIC REPAIR ACL    . ATRIAL FLUTTER ABLATION N/A 01/05/2013   Procedure: ATRIAL FLUTTER ABLATION;  Surgeon: Evans Lance, MD;  Location: [facility] CATH LAB;  Service: Cardiovascular;  Laterality: N/A;  . COLONOSCOPY  11/2009   Dr. Benson Norway  . ELECTROPHYSIOLOGIC STUDY N/A 07/04/2015   Procedure: A-Flutter Ablation;  Surgeon: Evans Lance, MD;  Location: [place] CV LAB;  Service: Cardiovascular;  Laterality: N/A;  . REPLACEMENT TOTAL KNEE Right 09/12/2017   by dr Lorin Mercy at [facility]  . ROTATOR CUFF REPAIR     Social History   Occupational History  . Not on file  Tobacco Use  . Smoking status: Former Smoker    Last attempt to quit: 01/06/1988    Years since quitting: 30.1  . Smokeless tobacco: Current User     Types: Chew  . Tobacco comment: chews tobacco when playing golf only  Substance and Sexual Activity  . Alcohol use: Yes    Alcohol/week: 3.0 oz    Types: 5 Glasses of wine per week    Comment: wine daily  . Drug use: No  . Sexual activity: Yes

## 2018-03-13 DIAGNOSIS — F1020 Alcohol dependence, uncomplicated: Secondary | ICD-10-CM | POA: Diagnosis not present

## 2018-03-14 DIAGNOSIS — F320 Major depressive disorder, single episode, mild: Secondary | ICD-10-CM | POA: Diagnosis not present

## 2018-03-14 DIAGNOSIS — F1020 Alcohol dependence, uncomplicated: Secondary | ICD-10-CM | POA: Diagnosis not present

## 2018-03-30 DIAGNOSIS — F1020 Alcohol dependence, uncomplicated: Secondary | ICD-10-CM | POA: Diagnosis not present

## 2018-04-14 ENCOUNTER — Ambulatory Visit (INDEPENDENT_AMBULATORY_CARE_PROVIDER_SITE_OTHER): Payer: PPO | Admitting: Internal Medicine

## 2018-04-14 ENCOUNTER — Encounter: Payer: Self-pay | Admitting: Internal Medicine

## 2018-04-14 VITALS — BP 128/82 | HR 56 | Temp 98.3°F | Ht 71.0 in | Wt 229.0 lb

## 2018-04-14 DIAGNOSIS — J3489 Other specified disorders of nose and nasal sinuses: Secondary | ICD-10-CM | POA: Diagnosis not present

## 2018-04-14 MED ORDER — IPRATROPIUM BROMIDE 0.06 % NA SOLN: 2.0000 | Freq: Four times a day (QID) | NASAL | 12 refills | Status: DC

## 2018-04-14 NOTE — Assessment & Plan Note (Signed)
Rx for atrovent nose spray prn. Suspect environmental allergy and advised to take zyrtec to help.

## 2018-04-14 NOTE — Progress Notes (Signed)
   Subjective:    Patient ID: Lee Lee, male    DOB: 29-Jun-1951, 67 y.o.   MRN: [id number]  HPI The patient is a 67 YO man coming in for excessive nose dripping. Mostly at night and in the morning. Going on for 4-5 weeks. During this time they were redoing flooring and kitchen renovation although they did not live in the house during most of the renovation. He is having clear drainage for several hours in the morning and wakes him up at night. Denies color to the drainage. Denies fevers or chills. Denies sinus pressure or headaches. This can cause him morning nausea and he is not able to eat breakfast usually. Weight is stable.   Review of Systems  Constitutional: Positive for appetite change. Negative for activity change, chills, fatigue, fever and unexpected weight change.  HENT: Positive for postnasal drip and rhinorrhea. Negative for congestion, ear discharge, ear pain, sinus pressure, sinus pain, sneezing, sore throat, tinnitus, trouble swallowing and voice change.   Eyes: Negative.   Respiratory: Negative for cough, chest tightness, shortness of breath and wheezing.   Cardiovascular: Negative.   Gastrointestinal: Negative.   Musculoskeletal: Negative for myalgias.  Neurological: Negative.       Objective:   Physical Exam  Constitutional: He is oriented to person, place, and time. He appears well-developed and well-nourished.  HENT:  Head: Normocephalic and atraumatic.  Eyes: EOM are normal.  Neck: Normal range of motion.  Cardiovascular: Normal rate and regular rhythm.  Pulmonary/Chest: Effort normal and breath sounds normal. No respiratory distress. He has no wheezes. He has no rales.  Abdominal: Soft.  Musculoskeletal: He exhibits no edema.  Neurological: He is alert and oriented to person, place, and time. Coordination normal.  Skin: Skin is warm and dry.   Vitals:   04/14/18 0800  BP: 128/82  Pulse: (!) 56  Temp: 98.3 F (36.8 C)  TempSrc: Oral  SpO2: 97%    Weight: 229 lb (103.9 kg)  Height: 5' 11"  (1.803 m)      Assessment & Plan:

## 2018-04-14 NOTE — Patient Instructions (Signed)
We have sent in the atrovent nose spray to use 2 sprays in each nostril up to 4 times a day as needed.   If you are still having this daily it might be worth trying the zyrtec daily for 1-2 weeks to stop this cycle.

## 2018-05-08 DIAGNOSIS — F1020 Alcohol dependence, uncomplicated: Secondary | ICD-10-CM | POA: Diagnosis not present

## 2018-05-15 DIAGNOSIS — F411 Generalized anxiety disorder: Secondary | ICD-10-CM | POA: Diagnosis not present

## 2018-05-15 DIAGNOSIS — F1020 Alcohol dependence, uncomplicated: Secondary | ICD-10-CM | POA: Diagnosis not present

## 2018-06-11 DIAGNOSIS — J069 Acute upper respiratory infection, unspecified: Secondary | ICD-10-CM | POA: Diagnosis not present

## 2018-06-11 DIAGNOSIS — H6091 Unspecified otitis externa, right ear: Secondary | ICD-10-CM | POA: Diagnosis not present

## 2018-06-11 DIAGNOSIS — J209 Acute bronchitis, unspecified: Secondary | ICD-10-CM | POA: Diagnosis not present

## 2018-06-12 ENCOUNTER — Encounter: Payer: Self-pay | Admitting: Family

## 2018-06-12 ENCOUNTER — Ambulatory Visit (INDEPENDENT_AMBULATORY_CARE_PROVIDER_SITE_OTHER): Payer: PPO | Admitting: Family

## 2018-06-12 VITALS — BP 118/80 | HR 63 | Temp 98.5°F | Ht 71.0 in | Wt 232.1 lb

## 2018-06-12 DIAGNOSIS — J209 Acute bronchitis, unspecified: Secondary | ICD-10-CM

## 2018-06-12 MED ORDER — FLUTICASONE PROPIONATE 50 MCG/ACT NA SUSP: 2.0000 | Freq: Every day | NASAL | 6 refills | Status: DC

## 2018-06-12 MED ORDER — BENZONATATE 100 MG PO CAPS: 100.0000 mg | ORAL_CAPSULE | Freq: Three times a day (TID) | ORAL | 0 refills | Status: DC | PRN

## 2018-06-12 NOTE — Progress Notes (Signed)
Lee Lee is a 67 y.o. male with the following history as recorded in EpicCare:  Patient Active Problem List   Diagnosis Date Noted  . Rhinorrhea 04/14/2018  . Unilateral primary osteoarthritis, left knee 03/08/2018  . Rupture of right quadriceps tendon 10/18/2017  . Insomnia due to anxiety and fear 09/30/2016  . Restless leg syndrome, familial 09/30/2016  . Iron deficiency anemia 09/30/2016  . Hypertriglyceridemia 06/15/2016  . Hyperlipidemia with target LDL less than 130 06/15/2016  . Hyperglycemia 06/15/2016  . S/P radiofrequency ablation operation for arrhythmia 07/04/15 07/05/2015  . SVT (supraventricular tachycardia) ([place]) 07/04/2015  . Melanoma of skin ([place]) 04/16/2015  . Atrial flutter ([place]) 12/07/2012  . Essential hypertension 11/07/2012  . Ascending aortic aneurysm ([facility]) 11/07/2012  . ED (erectile dysfunction) 11/15/2011    Current Outpatient Medications  Medication Sig Dispense Refill  . aspirin EC 81 MG tablet Take 1 tablet (81 mg total) by mouth daily. 90 tablet 3  . gabapentin (NEURONTIN) 300 MG capsule     . L-Methylfolate-Algae (DEPLIN 15 PO) Take 1 tablet by mouth daily.     Marland Kitchen losartan-hydrochlorothiazide (HYZAAR) 100-12.5 MG tablet Take 1 tablet by mouth daily. 90 tablet 1  . metoprolol succinate (TOPROL-XL) 25 MG 24 hr tablet TAKE ONE TABLET BY MOUTH TWICE A DAY TAKE WITH OR IMMEDIATELY FOLLOWING A MEAL 180 tablet 3  . sildenafil (REVATIO) 20 MG tablet TAKE ONE TO FIVE TABLETS  BY MOUTH DAILY AS NEEDED (Patient taking differently: TAKE ONE TO FIVE TABLETS  BY MOUTH DAILY AS NEEDED FOR ED) 50 tablet 11  . traZODone (DESYREL) 150 MG tablet Take 0.5 tablet by mouth daily at bedtime as needed for sleep    . vortioxetine HBr (TRINTELLIX) 10 MG TABS Take 10 mg by mouth daily.    Marland Kitchen azithromycin (ZITHROMAX) 250 MG tablet     . benzonatate (TESSALON) 100 MG capsule Take 1 capsule (100 mg total) by mouth 3 (three) times daily as needed. 20 capsule 0  . fluticasone  (FLONASE) 50 MCG/ACT nasal spray Place 2 sprays into both nostrils daily. 16 g 6   No current facility-administered medications for this visit.     Allergies: Patient has no known allergies.  Past Medical History:  Diagnosis Date  . Alcohol abuse, episodic drinking behavior   . Arthritis    back & knees  . Atrial flutter ([place])    s/p RFCA 01/05/13  . Calcium oxalate renal stones   . Complication of anesthesia    makes him loopy  . ED (erectile dysfunction)   . Hemorrhoids   . Hypertension     Past Surgical History:  Procedure Laterality Date  . ABLATION OF DYSRHYTHMIC FOCUS  01/05/2013  . ARTHROSCOPIC REPAIR ACL    . ATRIAL FLUTTER ABLATION N/A 01/05/2013   Procedure: ATRIAL FLUTTER ABLATION;  Surgeon: Evans Lance, MD;  Location: [facility] CATH LAB;  Service: Cardiovascular;  Laterality: N/A;  . COLONOSCOPY  11/2009   Dr. Benson Norway  . ELECTROPHYSIOLOGIC STUDY N/A 07/04/2015   Procedure: A-Flutter Ablation;  Surgeon: Evans Lance, MD;  Location: [facility] CV LAB;  Service: Cardiovascular;  Laterality: N/A;  . REPLACEMENT TOTAL KNEE Right 09/12/2017   by dr Lorin Mercy at [facility]  . ROTATOR CUFF REPAIR      Family History  Problem Relation Age of Onset  . Valvular heart disease Mother   . Lymphoma Mother     Social History   Tobacco Use  . Smoking status: Former Smoker  Last attempt to quit: 01/06/1988    Years since quitting: 30.4  . Smokeless tobacco: Current User    Types: Chew  . Tobacco comment: chews tobacco when playing golf only  Substance Use Topics  . Alcohol use: Yes    Alcohol/week: 3.0 oz    Types: 5 Glasses of wine per week    Comment: wine daily    Subjective:  Patient was seen at [facility] yesterday and was given Rx for Z-pak; + chest and nasal congestion x 1 week; on day 2 of his medicine and concerned because no improvement in symptoms; complaining of "thick congestion" causing him to feel like he is "gagging." + feel short of breath; not prone to bronchitis,  pneumonia or asthma; sleeping well at night until around 4 am- "coughing fits."  Objective:  Vitals:   06/12/18 1523  BP: 118/80  Pulse: 63  Temp: 98.5 F (36.9 C)  TempSrc: Oral  SpO2: 97%  Weight: 232 lb 1.3 oz (105.3 kg)  Height: 5' 11"  (1.803 m)    General: Well developed, well nourished, in no acute distress  Skin : Warm and dry.  Head: Normocephalic and atraumatic  Eyes: Sclera and conjunctiva clear; pupils round and reactive to light; extraocular movements intact  Ears: External normal; canals clear; tympanic membranes normal  Oropharynx: Pink, supple. No suspicious lesions  Neck: Supple without thyromegaly, adenopathy  Lungs: Respirations unlabored; clear to auscultation bilaterally without wheeze, rales, rhonchi  CVS exam: normal rate and regular rhythm.  Neurologic: Alert and oriented; speech intact; face symmetrical; moves all extremities well; CNII-XII intact without focal deficit   Assessment:  1. Acute bronchitis, unspecified organism     Plan:  Discussed treatment given by [facility] yesterday- agree that it is appropriate; discussed that is too soon to tell if the Z-pak is not going to be effective; he is on day 2 of the medicine and does feel somewhat better; will add Flonase and Tessalon perles for symptom relief; increase fluids, rest and follow-up worse, no better.   No follow-ups on file.  No orders of the defined types were placed in this encounter.   Requested Prescriptions   Signed Prescriptions Disp Refills  . benzonatate (TESSALON) 100 MG capsule 20 capsule 0    Sig: Take 1 capsule (100 mg total) by mouth 3 (three) times daily as needed.  . fluticasone (FLONASE) 50 MCG/ACT nasal spray 16 g 6    Sig: Place 2 sprays into both nostrils daily.

## 2018-06-12 NOTE — Patient Instructions (Signed)
Please call back if you feel like your symptoms don't respond to the Zithromax;

## 2018-06-13 ENCOUNTER — Encounter (INDEPENDENT_AMBULATORY_CARE_PROVIDER_SITE_OTHER): Payer: Self-pay | Admitting: Orthopaedic Surgery

## 2018-06-13 ENCOUNTER — Ambulatory Visit: Payer: PPO | Admitting: Family

## 2018-06-13 ENCOUNTER — Ambulatory Visit (INDEPENDENT_AMBULATORY_CARE_PROVIDER_SITE_OTHER): Payer: PPO | Admitting: Orthopaedic Surgery

## 2018-06-13 VITALS — BP 163/87 | HR 63 | Ht 71.0 in | Wt 229.0 lb

## 2018-06-13 DIAGNOSIS — M1712 Unilateral primary osteoarthritis, left knee: Secondary | ICD-10-CM | POA: Diagnosis not present

## 2018-06-13 NOTE — Progress Notes (Signed)
Office Visit Note   Patient: Lee Lee           Date of Birth: [birth year]           MRN: [id number] Visit Date: 06/13/2018              Requested by: Janith Lima, MD [address] [place], [place] [address] PCP: Janith Lima, MD   Assessment & Plan: Visit Diagnoses:  1. Unilateral primary osteoarthritis, left knee     Plan: We discussed options for treatment including total knee arthroplasty versus repeat injection.  We discussed Visco injections as well.  He has a golf trip to [place] in September and if he has increased symptoms in his knee he can return for his trip for repeat injection.  We discussed indications for total knee arthroplasty but at present he is able play golf although he does still has some problems going up and down the heel.  We reviewed x-rays previously obtained which showed the medial compartment bone-on-bone changes with marginal osteophytes and patellofemoral degenerative changes.  He will call if he needs to return.  Follow-Up Instructions: Return if symptoms worsen or fail to improve.   Orders:  No orders of the defined types were placed in this encounter.  No orders of the defined types were placed in this encounter.     Procedures: No procedures performed   Clinical Data: No additional findings.   Subjective: Chief Complaint  Patient presents with  . Left Knee - Follow-up    HPI 67 year old male returns post right quad tendon repair October 2018 doing well.  He is been followed for left knee osteoarthritis with many years ago having an open meniscectomy medially.  Injection intra-articularly left knee 03/08/2018 continues to do well he is back playing golf.  He still has problems going up and down hills has some varus deformity with medial bone-on-bone contact on plain radiographs left knee.  Right knee does not show significant osteoarthritis.  Review of Systems reviewed, updated and unchanged since his right quad  tendon repair. Of note his history of SVT with radiofrequency ablation for arrhythmia doing well.  Objective: Vital Signs: BP (!) 163/87   Pulse 63   Ht 5' 11"  (1.803 m)   Wt 229 lb (103.9 kg)   BMI 31.94 kg/m   Physical Exam  Constitutional: He is oriented to person, place, and time. He appears well-developed and well-nourished.  HENT:  Head: Normocephalic and atraumatic.  Eyes: Pupils are equal, round, and reactive to light. EOM are normal.  Neck: No tracheal deviation present. No thyromegaly present.  Cardiovascular: Normal rate.  Pulmonary/Chest: Effort normal. He has no wheezes.  Abdominal: Soft. Bowel sounds are normal.  Neurological: He is alert and oriented to person, place, and time.  Skin: Skin is warm and dry. Capillary refill takes less than 2 seconds.  Psychiatric: He has a normal mood and affect. His behavior is normal. Judgment and thought content normal.    Ortho Exam well-healed incision right knee from quad tendon repair full extension good quad strength he only has trace quad atrophy.  Opposite left knee shows well-healed small medial arthrotomy from medial meniscus resection many years ago.  He lacks 3 degrees reaching full extension has mild varus deformity of 5 to 10 degrees.  Crepitus with knee range of motion.  No significant knee effusion.  He is walking better since the injection in April.  Specialty Comments:  No specialty comments available.  Imaging: No results found.   PMFS History: Patient Active Problem List   Diagnosis Date Noted  . Rhinorrhea 04/14/2018  . Unilateral primary osteoarthritis, left knee 03/08/2018  . Rupture of right quadriceps tendon 10/18/2017  . Insomnia due to anxiety and fear 09/30/2016  . Restless leg syndrome, familial 09/30/2016  . Iron deficiency anemia 09/30/2016  . Hypertriglyceridemia 06/15/2016  . Hyperlipidemia with target LDL less than 130 06/15/2016  . Hyperglycemia 06/15/2016  . S/P radiofrequency ablation  operation for arrhythmia 07/04/15 07/05/2015  . SVT (supraventricular tachycardia) ([place]) 07/04/2015  . Melanoma of skin ([place]) 04/16/2015  . Atrial flutter ([place]) 12/07/2012  . Essential hypertension 11/07/2012  . Ascending aortic aneurysm ([place]) 11/07/2012  . ED (erectile dysfunction) 11/15/2011   Past Medical History:  Diagnosis Date  . Alcohol abuse, episodic drinking behavior   . Arthritis    back & knees  . Atrial flutter ([place])    s/p RFCA 01/05/13  . Calcium oxalate renal stones   . Complication of anesthesia    makes him loopy  . ED (erectile dysfunction)   . Hemorrhoids   . Hypertension     Family History  Problem Relation Age of Onset  . Valvular heart disease Mother   . Lymphoma Mother     Past Surgical History:  Procedure Laterality Date  . ABLATION OF DYSRHYTHMIC FOCUS  01/05/2013  . ARTHROSCOPIC REPAIR ACL    . ATRIAL FLUTTER ABLATION N/A 01/05/2013   Procedure: ATRIAL FLUTTER ABLATION;  Surgeon: Evans Lance, MD;  Location: [facility] CATH LAB;  Service: Cardiovascular;  Laterality: N/A;  . COLONOSCOPY  11/2009   Dr. Benson Norway  . ELECTROPHYSIOLOGIC STUDY N/A 07/04/2015   Procedure: A-Flutter Ablation;  Surgeon: Evans Lance, MD;  Location: [facility] CV LAB;  Service: Cardiovascular;  Laterality: N/A;  . REPLACEMENT TOTAL KNEE Right 09/12/2017   by dr Lorin Mercy at [facility]  . ROTATOR CUFF REPAIR     Social History   Occupational History  . Not on file  Tobacco Use  . Smoking status: Former Smoker    Last attempt to quit: 01/06/1988    Years since quitting: 30.4  . Smokeless tobacco: Current User    Types: Chew  . Tobacco comment: chews tobacco when playing golf only  Substance and Sexual Activity  . Alcohol use: Yes    Alcohol/week: 3.0 oz    Types: 5 Glasses of wine per week    Comment: wine daily  . Drug use: No  . Sexual activity: Yes

## 2018-06-22 DIAGNOSIS — F1020 Alcohol dependence, uncomplicated: Secondary | ICD-10-CM | POA: Diagnosis not present

## 2018-07-19 ENCOUNTER — Other Ambulatory Visit: Payer: Self-pay | Admitting: Internal Medicine

## 2018-07-19 DIAGNOSIS — I10 Essential (primary) hypertension: Secondary | ICD-10-CM

## 2018-07-20 ENCOUNTER — Ambulatory Visit (INDEPENDENT_AMBULATORY_CARE_PROVIDER_SITE_OTHER): Payer: PPO | Admitting: Nurse Practitioner

## 2018-07-20 ENCOUNTER — Encounter: Payer: Self-pay | Admitting: Nurse Practitioner

## 2018-07-20 VITALS — BP 138/86 | HR 55 | Ht 71.0 in | Wt 229.0 lb

## 2018-07-20 DIAGNOSIS — I10 Essential (primary) hypertension: Secondary | ICD-10-CM | POA: Diagnosis not present

## 2018-07-20 DIAGNOSIS — J3089 Other allergic rhinitis: Secondary | ICD-10-CM | POA: Diagnosis not present

## 2018-07-20 MED ORDER — LOSARTAN POTASSIUM-HCTZ 100-12.5 MG PO TABS: 1.0000 | ORAL_TABLET | Freq: Every day | ORAL | 1 refills | Status: DC

## 2018-07-20 NOTE — Assessment & Plan Note (Signed)
BP reading stable today Continue current medications Instructions to F/U with Dr Ronnald Ramp for routine f/u of chronic conditions - losartan-hydrochlorothiazide (HYZAAR) 100-12.5 MG tablet; Take 1 tablet by mouth daily.  Dispense: 30 tablet; Refill: 1

## 2018-07-20 NOTE — Patient Instructions (Signed)
Please start flonase- you may start with 2 sprays in each nostril daily then reduce to 1 spray in each nostril daily when your symptoms improve You may also take an over the counter allergy medication such as claritin or zyrtec for your symptoms. Please return if your symptoms persist after trial of flonase and claritin/zyrtec.  Please schedule a visit with Dr Ronnald Ramp for routine follow up.  Allergic Rhinitis, Adult Allergic rhinitis is an allergic reaction that affects the mucous membrane inside the nose. It causes sneezing, a runny or stuffy nose, and the feeling of mucus going down the back of the throat (postnasal drip). Allergic rhinitis can be mild to severe. There are two types of allergic rhinitis:  Seasonal. This type is also called hay fever. It happens only during certain seasons.  Perennial. This type can happen at any time of the year.  What are the causes? This condition happens when the body's defense system (immune system) responds to certain harmless substances called allergens as though they were germs.  Seasonal allergic rhinitis is triggered by pollen, which can come from grasses, trees, and weeds. Perennial allergic rhinitis may be caused by:  House dust mites.  Pet dander.  Mold spores.  What are the signs or symptoms? Symptoms of this condition include:  Sneezing.  Runny or stuffy nose (nasal congestion).  Postnasal drip.  Itchy nose.  Tearing of the eyes.  Trouble sleeping.  Daytime sleepiness.  How is this diagnosed? This condition may be diagnosed based on:  Your medical history.  A physical exam.  Tests to check for related conditions, such as: ? Asthma. ? Pink eye. ? Ear infection. ? Upper respiratory infection.  Tests to find out which allergens trigger your symptoms. These may include skin or blood tests.  How is this treated? There is no cure for this condition, but treatment can help control symptoms. Treatment may  include:  Taking medicines that block allergy symptoms, such as antihistamines. Medicine may be given as a shot, nasal spray, or pill.  Avoiding the allergen.  Desensitization. This treatment involves getting ongoing shots until your body becomes less sensitive to the allergen. This treatment may be done if other treatments do not help.  If taking medicine and avoiding the allergen does not work, new, stronger medicines may be prescribed.  Follow these instructions at home:  Find out what you are allergic to. Common allergens include smoke, dust, and pollen.  Avoid the things you are allergic to. These are some things you can do to help avoid allergens: ? Replace carpet with wood, tile, or vinyl flooring. Carpet can trap dander and dust. ? Do not smoke. Do not allow smoking in your home. ? Change your heating and air conditioning filter at least once a month. ? During allergy season:  Keep windows closed as much as possible.  Plan outdoor activities when pollen counts are lowest. This is usually during the evening hours.  When coming indoors, change clothing and shower before sitting on furniture or bedding.  Take over-the-counter and prescription medicines only as told by your health care provider.  Keep all follow-up visits as told by your health care provider. This is important. Contact a health care provider if:  You have a fever.  You develop a persistent cough.  You make whistling sounds when you breathe (you wheeze).  Your symptoms interfere with your normal daily activities. Get help right away if:  You have shortness of breath. Summary  This condition can be  managed by taking medicines as directed and avoiding allergens.  Contact your health care provider if you develop a persistent cough or fever.  During allergy season, keep windows closed as much as possible. This information is not intended to replace advice given to you by your health care provider. Make  sure you discuss any questions you have with your health care provider. Document Released: 08/10/2001 Document Revised: 12/23/2016 Document Reviewed: 12/23/2016 Elsevier Interactive Patient Education  [employer].

## 2018-07-20 NOTE — Progress Notes (Signed)
Name: Lee Lee   MRN: [id number]    DOB: [birth year]   Date:07/20/2018       Progress Note  Subjective  Chief Complaint Ear pressure, medication refill   HPI Mr Marschall is here today for evaluation of bilateral ear pressure and a medication refill. He reports bilateral ear pressure and "popping" which have been occurring almost daily since around July.  He says he was treated for similar symptoms with ear drops in July for "ear canal inflammation" but symptoms have not really improved. He reports rhinorrhea with clear nasal drainage most days recnetly, occasional headaches. He denies fevers, chills, weakness, vision changes, ear drainage, watery eyes, sore throat, chest pain, shortness of breath. Has not tried anything at home for his symptoms Has been swimming frequently this summer.  Hypertension -he has been maintained on metoprolol succinate 25 BID, losartan-HCTZ 100-12.5 daily for some time now Reports daily medication compliance without noted adverse medication effects.  BP Readings from Last 3 Encounters:  07/20/18 138/86  06/13/18 (!) 163/87  06/12/18 118/80       Patient Active Problem List   Diagnosis Date Noted  . Rhinorrhea 04/14/2018  . Unilateral primary osteoarthritis, left knee 03/08/2018  . Rupture of right quadriceps tendon 10/18/2017  . Insomnia due to anxiety and fear 09/30/2016  . Restless leg syndrome, familial 09/30/2016  . Iron deficiency anemia 09/30/2016  . Hypertriglyceridemia 06/15/2016  . Hyperlipidemia with target LDL less than 130 06/15/2016  . Hyperglycemia 06/15/2016  . S/P radiofrequency ablation operation for arrhythmia 07/04/15 07/05/2015  . SVT (supraventricular tachycardia) ([place]) 07/04/2015  . Melanoma of skin ([place]) 04/16/2015  . Atrial flutter ([place]) 12/07/2012  . Essential hypertension 11/07/2012  . Ascending aortic aneurysm ([place]) 11/07/2012  . ED (erectile dysfunction) 11/15/2011    Past Surgical History:  Procedure Laterality  Date  . ABLATION OF DYSRHYTHMIC FOCUS  01/05/2013  . ARTHROSCOPIC REPAIR ACL    . ATRIAL FLUTTER ABLATION N/A 01/05/2013   Procedure: ATRIAL FLUTTER ABLATION;  Surgeon: Evans Lance, MD;  Location: [facility] CATH LAB;  Service: Cardiovascular;  Laterality: N/A;  . COLONOSCOPY  11/2009   Dr. Benson Norway  . ELECTROPHYSIOLOGIC STUDY N/A 07/04/2015   Procedure: A-Flutter Ablation;  Surgeon: Evans Lance, MD;  Location: [place] CV LAB;  Service: Cardiovascular;  Laterality: N/A;  . REPLACEMENT TOTAL KNEE Right 09/12/2017   by dr Lorin Mercy at [facility]  . ROTATOR CUFF REPAIR      Family History  Problem Relation Age of Onset  . Valvular heart disease Mother   . Lymphoma Mother     Social History   Socioeconomic History  . Marital status: Married    Spouse name: Not on file  . Number of children: Not on file  . Years of education: Not on file  . Highest education level: Not on file  Occupational History  . Not on file  Social Needs  . Financial resource strain: Not on file  . Food insecurity:    Worry: Not on file    Inability: Not on file  . Transportation needs:    Medical: Not on file    Non-medical: Not on file  Tobacco Use  . Smoking status: Former Smoker    Last attempt to quit: 01/06/1988    Years since quitting: 30.5  . Smokeless tobacco: Current User    Types: Chew  . Tobacco comment: chews tobacco when playing golf only  Substance and Sexual Activity  . Alcohol use: Yes  Alcohol/week: 5.0 standard drinks    Types: 5 Glasses of wine per week    Comment: wine daily  . Drug use: No  . Sexual activity: Yes  Lifestyle  . Physical activity:    Days per week: Not on file    Minutes per session: Not on file  . Stress: Not on file  Relationships  . Social connections:    Talks on phone: Not on file    Gets together: Not on file    Attends religious service: Not on file    Active member of club or organization: Not on file    Attends meetings of clubs or organizations:  Not on file    Relationship status: Not on file  . Intimate partner violence:    Fear of current or ex partner: Not on file    Emotionally abused: Not on file    Physically abused: Not on file    Forced sexual activity: Not on file  Other Topics Concern  . Not on file  Social History Narrative  . Not on file     Current Outpatient Medications:  .  aspirin EC 81 MG tablet, Take 1 tablet (81 mg total) by mouth daily., Disp: 90 tablet, Rfl: 3 .  fluticasone (FLONASE) 50 MCG/ACT nasal spray, Place 2 sprays into both nostrils daily., Disp: 16 g, Rfl: 6 .  gabapentin (NEURONTIN) 300 MG capsule, , Disp: , Rfl:  .  L-Methylfolate-Algae (DEPLIN 15 PO), Take 1 tablet by mouth daily. , Disp: , Rfl:  .  losartan-hydrochlorothiazide (HYZAAR) 100-12.5 MG tablet, Take 1 tablet by mouth daily., Disp: 90 tablet, Rfl: 1 .  metoprolol succinate (TOPROL-XL) 25 MG 24 hr tablet, TAKE ONE TABLET BY MOUTH TWICE A DAY TAKE WITH OR IMMEDIATELY FOLLOWING A MEAL, Disp: 180 tablet, Rfl: 3 .  sildenafil (REVATIO) 20 MG tablet, TAKE ONE TO FIVE TABLETS  BY MOUTH DAILY AS NEEDED (Patient taking differently: TAKE ONE TO FIVE TABLETS  BY MOUTH DAILY AS NEEDED FOR ED), Disp: 50 tablet, Rfl: 11 .  traZODone (DESYREL) 150 MG tablet, Take 0.5 tablet by mouth daily at bedtime as needed for sleep, Disp: , Rfl:  .  vortioxetine HBr (TRINTELLIX) 10 MG TABS, Take 10 mg by mouth daily., Disp: , Rfl:   No Known Allergies   ROS See HPI  Objective  Vitals:   07/20/18 0756  BP: 138/86  Pulse: (!) 55  SpO2: 96%  Weight: 229 lb (103.9 kg)  Height: 5' 11"  (1.803 m)    Body mass index is 31.94 kg/m.  Physical Exam Vital signs reviewed. Constitutional: Patient appears well-developed and well-nourished. No distress.  HENT: Head: Normocephalic and atraumatic. Ears: Bilateral TMs without erythema or effusion; Nose: Nose normal. Mouth/Throat: Oropharynx is clear and moist. No oropharyngeal exudate.  Eyes: Conjunctivae and  EOM are normal. Pupils are equal, round, and reactive to light. No scleral icterus.  Neck: Normal range of motion. Neck supple. Cardiovascular: Normal rate, regular rhythm and normal heart sounds.   Distal pulses intact. Pulmonary/Chest: Effort normal and breath sounds normal. No respiratory distress. Neurological: he is alert and oriented to person, place, and time. No cranial nerve deficit. Coordination, balance, strength, speech and gait are normal.  Skin: Skin is warm and dry. No rash noted. No erythema.  Psychiatric: Patient has a normal mood and affect. behavior is normal. Judgment and thought content normal.   Assessment & Plan  Allergic rhinitis due to other allergic trigger, unspecified seasonality No signs of infection  today, suspect his ear pressure and popping could be related to allergic rhinitis flonase Rx was sent for him in July by another provider but he has not tried it, we discussed trial of flonase and OTC antihistamine for symptoms, he was instructed to follow up if no improvement Additional information and f/u instructions provided on AVS

## 2018-08-04 ENCOUNTER — Other Ambulatory Visit: Payer: Self-pay | Admitting: Internal Medicine

## 2018-08-04 DIAGNOSIS — I712 Thoracic aortic aneurysm, without rupture: Secondary | ICD-10-CM

## 2018-08-04 DIAGNOSIS — I7121 Aneurysm of the ascending aorta, without rupture: Secondary | ICD-10-CM

## 2018-08-04 DIAGNOSIS — I10 Essential (primary) hypertension: Secondary | ICD-10-CM

## 2018-08-07 DIAGNOSIS — F411 Generalized anxiety disorder: Secondary | ICD-10-CM | POA: Diagnosis not present

## 2018-08-07 DIAGNOSIS — F1020 Alcohol dependence, uncomplicated: Secondary | ICD-10-CM | POA: Diagnosis not present

## 2018-08-21 DIAGNOSIS — F1020 Alcohol dependence, uncomplicated: Secondary | ICD-10-CM | POA: Diagnosis not present

## 2018-08-23 DIAGNOSIS — F1020 Alcohol dependence, uncomplicated: Secondary | ICD-10-CM | POA: Diagnosis not present

## 2018-09-05 ENCOUNTER — Other Ambulatory Visit: Payer: Self-pay | Admitting: Internal Medicine

## 2018-09-05 DIAGNOSIS — I7121 Aneurysm of the ascending aorta, without rupture: Secondary | ICD-10-CM

## 2018-09-05 DIAGNOSIS — I712 Thoracic aortic aneurysm, without rupture: Secondary | ICD-10-CM

## 2018-09-05 DIAGNOSIS — I10 Essential (primary) hypertension: Secondary | ICD-10-CM

## 2018-09-11 DIAGNOSIS — F1020 Alcohol dependence, uncomplicated: Secondary | ICD-10-CM | POA: Diagnosis not present

## 2018-09-17 ENCOUNTER — Other Ambulatory Visit: Payer: Self-pay | Admitting: Internal Medicine

## 2018-09-17 DIAGNOSIS — I10 Essential (primary) hypertension: Secondary | ICD-10-CM

## 2018-09-17 DIAGNOSIS — I7121 Aneurysm of the ascending aorta, without rupture: Secondary | ICD-10-CM

## 2018-09-17 DIAGNOSIS — I712 Thoracic aortic aneurysm, without rupture: Secondary | ICD-10-CM

## 2018-09-18 ENCOUNTER — Telehealth: Payer: Self-pay | Admitting: Internal Medicine

## 2018-09-18 DIAGNOSIS — I10 Essential (primary) hypertension: Secondary | ICD-10-CM

## 2018-09-18 DIAGNOSIS — I712 Thoracic aortic aneurysm, without rupture: Secondary | ICD-10-CM

## 2018-09-18 DIAGNOSIS — I7121 Aneurysm of the ascending aorta, without rupture: Secondary | ICD-10-CM

## 2018-09-18 MED ORDER — METOPROLOL SUCCINATE ER 25 MG PO TB24: ORAL_TABLET | ORAL | 0 refills | Status: DC

## 2018-09-18 NOTE — Telephone Encounter (Signed)
New message    *STAT* If patient is at the pharmacy, call can be transferred to refill team.   1. Which medications need to be refilled? (please list name of each medication and dose if known) metoprolol 25 mg  2. Which pharmacy/location (including street and city if local pharmacy) is medication to be sent to? [facility]   3. Do they need a 30 day or 90 day supply? 30 day

## 2018-09-18 NOTE — Telephone Encounter (Signed)
Pt's medication was sent to pt's pharmacy as requested. Confirmation received.  °

## 2018-09-20 ENCOUNTER — Other Ambulatory Visit: Payer: Self-pay | Admitting: Nurse Practitioner

## 2018-09-20 DIAGNOSIS — I10 Essential (primary) hypertension: Secondary | ICD-10-CM

## 2018-09-29 ENCOUNTER — Ambulatory Visit: Payer: PPO | Admitting: Nurse Practitioner

## 2018-09-30 ENCOUNTER — Other Ambulatory Visit: Payer: Self-pay | Admitting: Internal Medicine

## 2018-09-30 DIAGNOSIS — I7121 Aneurysm of the ascending aorta, without rupture: Secondary | ICD-10-CM

## 2018-09-30 DIAGNOSIS — I712 Thoracic aortic aneurysm, without rupture: Secondary | ICD-10-CM

## 2018-09-30 DIAGNOSIS — I10 Essential (primary) hypertension: Secondary | ICD-10-CM

## 2018-10-09 NOTE — Progress Notes (Signed)
Electrophysiology Office Note Date: 10/10/2018  ID:  Lee Lee, DOB October 31, 1951, MRN [id number]  PCP: Lee Lima, MD Electrophysiologist: Lee Lee  CC: SVT follow up  Lee Lee is a 67 y.o. male seen today for Dr Lee Lee.  He presents today for routine electrophysiology followup.  Since last being seen in our clinic, the patient reports doing reasonably well.  He continues with occasional palpitations that feel like a "hard heart beat every 5th beat". He notices them more frequently at night.  He denies chest pain, palpitations, dyspnea, PND, orthopnea, nausea, vomiting, dizziness, syncope, edema, weight gain, or early satiety.  Past Medical History:  Diagnosis Date  . Alcohol abuse, episodic drinking behavior   . Arthritis    back & knees  . Atrial flutter ([place])    s/p RFCA 01/05/13  . Calcium oxalate renal stones   . Complication of anesthesia    makes him loopy  . ED (erectile dysfunction)   . Hemorrhoids   . Hypertension    Past Surgical History:  Procedure Laterality Date  . ABLATION OF DYSRHYTHMIC FOCUS  01/05/2013  . ARTHROSCOPIC REPAIR ACL    . ATRIAL FLUTTER ABLATION N/A 01/05/2013   Procedure: ATRIAL FLUTTER ABLATION;  Surgeon: Lee Lance, MD;  Location: [facility] CATH LAB;  Service: Cardiovascular;  Laterality: N/A;  . COLONOSCOPY  11/2009   Dr. Benson Lee  . ELECTROPHYSIOLOGIC STUDY N/A 07/04/2015   Procedure: A-Flutter Ablation;  Surgeon: Lee Lance, MD;  Location: [facility] CV LAB;  Service: Cardiovascular;  Laterality: N/A;  . REPLACEMENT TOTAL KNEE Right 09/12/2017   by dr Lorin Mercy at [facility]  . ROTATOR CUFF REPAIR      Current Outpatient Medications  Medication Sig Dispense Refill  . aspirin EC 81 MG tablet Take 1 tablet (81 mg total) by mouth daily. 90 tablet 3  . fluticasone (FLONASE) 50 MCG/ACT nasal spray Place 2 sprays into both nostrils daily. 16 g 6  . gabapentin (NEURONTIN) 300 MG capsule     . L-Methylfolate-Algae (DEPLIN 15 PO)  Take 1 tablet by mouth daily.     Marland Kitchen losartan-hydrochlorothiazide (HYZAAR) 100-12.5 MG tablet Take 1 tablet by mouth daily. Needs appointment. 30 tablet 0  . metoprolol succinate (TOPROL-XL) 25 MG 24 hr tablet TAKE ONE TABLET BY MOUTH TWICE A DAY WITH OR IMMEDIATELY FOLLOWING A MEAL. PLEASE KEEP UPCOMING APPOINTMENT IN NOVEMBER FOR FUTURE REFILLS. 30 tablet 0  . sildenafil (REVATIO) 20 MG tablet Take 20-100 mg by mouth as needed (ED).    . traZODone (DESYREL) 150 MG tablet Take 0.5 tablet by mouth daily at bedtime as needed for sleep    . vortioxetine HBr (TRINTELLIX) 10 MG TABS Take 10 mg by mouth daily.     No current facility-administered medications for this visit.     Allergies:   Patient has no known allergies.   Social History: Social History   Socioeconomic History  . Marital status: Married    Spouse name: Not on file  . Number of children: Not on file  . Years of education: Not on file  . Highest education level: Not on file  Occupational History  . Not on file  Social Needs  . Financial resource strain: Not on file  . Food insecurity:    Worry: Not on file    Inability: Not on file  . Transportation needs:    Medical: Not on file    Non-medical: Not on file  Tobacco Use  . Smoking status:  Former Smoker    Last attempt to quit: 01/06/1988    Years since quitting: 30.7  . Smokeless tobacco: Current User    Types: Chew  . Tobacco comment: chews tobacco when playing golf only  Substance and Sexual Activity  . Alcohol use: Yes    Alcohol/week: 5.0 standard drinks    Types: 5 Glasses of wine per week    Comment: wine daily  . Drug use: No  . Sexual activity: Yes  Lifestyle  . Physical activity:    Days per week: Not on file    Minutes per session: Not on file  . Stress: Not on file  Relationships  . Social connections:    Talks on phone: Not on file    Gets together: Not on file    Attends religious service: Not on file    Active member of club or  organization: Not on file    Attends meetings of clubs or organizations: Not on file    Relationship status: Not on file  . Intimate partner violence:    Fear of current or ex partner: Not on file    Emotionally abused: Not on file    Physically abused: Not on file    Forced sexual activity: Not on file  Other Topics Concern  . Not on file  Social History Narrative  . Not on file    Family History: Family History  Problem Relation Age of Onset  . Valvular heart disease Mother   . Lymphoma Mother     Review of Systems: All other systems reviewed and are otherwise negative except as noted above.   Physical Exam: VS:  BP 140/84   Pulse (!) 57   Ht 5' 11"  (1.803 m)   Wt 230 lb 12.8 oz (104.7 kg)   SpO2 96%   BMI 32.19 kg/m  , BMI Body mass index is 32.19 kg/m. Wt Readings from Last 3 Encounters:  10/10/18 230 lb 12.8 oz (104.7 kg)  07/20/18 229 lb (103.9 kg)  06/13/18 229 lb (103.9 kg)    GEN- The patient is well appearing, alert and oriented x 3 today.   HEENT: normocephalic, atraumatic; sclera clear, conjunctiva pink; hearing intact; oropharynx clear; neck supple  Lungs- Clear to ausculation bilaterally, normal work of breathing.  No wheezes, rales, rhonchi Heart- Regular rate and rhythm  GI- soft, non-tender, non-distended, bowel sounds present  Extremities- no clubbing, cyanosis, or edema  MS- no significant deformity or atrophy Skin- warm and dry, no rash or lesion  Psych- euthymic mood, full affect Neuro- strength and sensation are intact   EKG:  EKG is ordered today. The ekg ordered today shows sinus bradycardia, rate 57  Recent Labs: 10/31/2017: BUN 21; Creatinine, Ser 0.87; Hemoglobin 15.0; Platelets 181.0; Potassium 4.3; Sodium 141    Other studies Reviewed: Additional studies/ records that were reviewed today include: Dr Tanna Furry office notes  Assessment and Plan:  1.  SVT S/p ablation of parahisian atrial tachycardia by Dr Lee Lee  2.   Palpitations Likely PVC's Reviewed benign nature I have advised that he can take an extra Toprol if needed   3.  HTN Stable No change required today    Current medicines are reviewed at length with the patient today.   The patient does not have concerns regarding his medicines.  The following changes were made today:  none  Labs/ tests ordered today include: none No orders of the defined types were placed in this encounter.    Disposition:  Follow up with Dr Lee Lee 1 year or EP prn if doing well at that time.     Signed, Chanetta Marshall, NP 10/10/2018 9:46 AM   [facility] [address] [place]  [place] [address] [phone] (office) [phone] (fax)

## 2018-10-10 ENCOUNTER — Ambulatory Visit: Payer: PPO | Admitting: Nurse Practitioner

## 2018-10-10 ENCOUNTER — Encounter: Payer: Self-pay | Admitting: Nurse Practitioner

## 2018-10-10 VITALS — BP 140/84 | HR 57 | Ht 71.0 in | Wt 230.8 lb

## 2018-10-10 DIAGNOSIS — I10 Essential (primary) hypertension: Secondary | ICD-10-CM | POA: Diagnosis not present

## 2018-10-10 DIAGNOSIS — I712 Thoracic aortic aneurysm, without rupture: Secondary | ICD-10-CM

## 2018-10-10 DIAGNOSIS — R002 Palpitations: Secondary | ICD-10-CM | POA: Diagnosis not present

## 2018-10-10 DIAGNOSIS — I7121 Aneurysm of the ascending aorta, without rupture: Secondary | ICD-10-CM

## 2018-10-10 DIAGNOSIS — I471 Supraventricular tachycardia: Secondary | ICD-10-CM | POA: Diagnosis not present

## 2018-10-10 MED ORDER — METOPROLOL SUCCINATE ER 25 MG PO TB24: 25.0000 mg | ORAL_TABLET | Freq: Two times a day (BID) | ORAL | 3 refills | Status: DC

## 2018-10-10 NOTE — Patient Instructions (Signed)
Medication Instructions:   NONE ORDERED  TODAY  If you need a refill on your cardiac medications before your next appointment, please call your pharmacy.   Lab work:  NONE ORDERED  TODAY   If you have labs (blood work) drawn today and your tests are completely normal, you will receive your results only by: Marland Kitchen MyChart Message (if you have MyChart) OR . A paper copy in the mail If you have any lab test that is abnormal or we need to change your treatment, we will call you to review the results.  Testing/Procedures:  NONE ORDERED  TODAY   Follow-Up: At [facility], you and your health needs are our priority.  As part of our continuing mission to provide you with exceptional heart care, we have created designated Provider Care Teams.  These Care Teams include your primary Cardiologist (physician) and Advanced Practice Providers (APPs -  Physician Assistants and Nurse Practitioners) who all work together to provide you with the care you need, when you need it. You will need a follow up appointment in 12 months Dr. [name].  Please call our office 2 months in advance to schedule this appointment.  You may seeone of the following Advanced Practice Providers on your designated Care Team:   Chanetta Marshall, NP . Tommye Standard, PA-C  Any Other Special Instructions Will Be Listed Below (If Applicable).

## 2018-10-17 DIAGNOSIS — F1020 Alcohol dependence, uncomplicated: Secondary | ICD-10-CM | POA: Diagnosis not present

## 2018-10-17 DIAGNOSIS — F320 Major depressive disorder, single episode, mild: Secondary | ICD-10-CM | POA: Diagnosis not present

## 2018-10-18 ENCOUNTER — Other Ambulatory Visit: Payer: Self-pay | Admitting: Internal Medicine

## 2018-10-18 DIAGNOSIS — I10 Essential (primary) hypertension: Secondary | ICD-10-CM

## 2018-11-20 ENCOUNTER — Other Ambulatory Visit: Payer: Self-pay | Admitting: Internal Medicine

## 2018-11-20 DIAGNOSIS — I10 Essential (primary) hypertension: Secondary | ICD-10-CM

## 2018-11-20 MED ORDER — LOSARTAN POTASSIUM-HCTZ 100-12.5 MG PO TABS: ORAL_TABLET | ORAL | 0 refills | Status: DC

## 2018-11-20 NOTE — Telephone Encounter (Signed)
Copied from [place] [id number]. Topic: Quick Communication - Rx Refill/Question >> Nov 20, 2018 10:11 AM Alanda Slim E wrote: Medication: losartan-hydrochlorothiazide (HYZAAR) 100-12.5 MG tablet   Has the patient contacted their pharmacy? Yes.  (pharmacy stated they were advised that Pt was no longer a Pt of Dr. Ronnald Ramp)  Preferred Pharmacy (with phone number or street name): [facility] [address] [place] - [address] [phone] (Phone) [phone] (Fax)    Agent: Please be advised that RX refills may take up to 3 business days. We ask that you follow-up with your pharmacy.

## 2018-12-16 ENCOUNTER — Other Ambulatory Visit: Payer: Self-pay | Admitting: Internal Medicine

## 2018-12-16 DIAGNOSIS — I10 Essential (primary) hypertension: Secondary | ICD-10-CM

## 2018-12-26 ENCOUNTER — Other Ambulatory Visit (INDEPENDENT_AMBULATORY_CARE_PROVIDER_SITE_OTHER): Payer: PPO

## 2018-12-26 ENCOUNTER — Encounter: Payer: Self-pay | Admitting: Internal Medicine

## 2018-12-26 ENCOUNTER — Ambulatory Visit: Payer: PPO | Admitting: *Deleted

## 2018-12-26 ENCOUNTER — Ambulatory Visit (INDEPENDENT_AMBULATORY_CARE_PROVIDER_SITE_OTHER): Payer: PPO | Admitting: Internal Medicine

## 2018-12-26 VITALS — BP 141/76 | HR 65 | Temp 98.7°F | Resp 17 | Ht 71.0 in | Wt 234.0 lb

## 2018-12-26 VITALS — BP 130/84 | HR 65 | Temp 98.7°F | Resp 16 | Ht 71.0 in | Wt 234.0 lb

## 2018-12-26 DIAGNOSIS — R195 Other fecal abnormalities: Secondary | ICD-10-CM | POA: Diagnosis not present

## 2018-12-26 DIAGNOSIS — J301 Allergic rhinitis due to pollen: Secondary | ICD-10-CM

## 2018-12-26 DIAGNOSIS — I7121 Aneurysm of the ascending aorta, without rupture: Secondary | ICD-10-CM

## 2018-12-26 DIAGNOSIS — I10 Essential (primary) hypertension: Secondary | ICD-10-CM | POA: Diagnosis not present

## 2018-12-26 DIAGNOSIS — I712 Thoracic aortic aneurysm, without rupture, unspecified: Secondary | ICD-10-CM

## 2018-12-26 DIAGNOSIS — Z0000 Encounter for general adult medical examination without abnormal findings: Secondary | ICD-10-CM

## 2018-12-26 DIAGNOSIS — Z23 Encounter for immunization: Secondary | ICD-10-CM | POA: Diagnosis not present

## 2018-12-26 DIAGNOSIS — R739 Hyperglycemia, unspecified: Secondary | ICD-10-CM

## 2018-12-26 DIAGNOSIS — N400 Enlarged prostate without lower urinary tract symptoms: Secondary | ICD-10-CM

## 2018-12-26 DIAGNOSIS — E785 Hyperlipidemia, unspecified: Secondary | ICD-10-CM | POA: Diagnosis not present

## 2018-12-26 LAB — PSA: PSA: 3.15 ng/mL (ref 0.10–4.00)

## 2018-12-26 LAB — URINALYSIS, ROUTINE W REFLEX MICROSCOPIC
Bilirubin Urine: NEGATIVE
Ketones, ur: NEGATIVE
Leukocytes, UA: NEGATIVE
NITRITE: NEGATIVE
Specific Gravity, Urine: 1.02 (ref 1.000–1.030)
Total Protein, Urine: NEGATIVE
URINE GLUCOSE: NEGATIVE
Urobilinogen, UA: 0.2 (ref 0.0–1.0)
pH: 6 (ref 5.0–8.0)

## 2018-12-26 LAB — TSH: TSH: 0.68 u[IU]/mL (ref 0.35–4.50)

## 2018-12-26 LAB — CBC WITH DIFFERENTIAL/PLATELET
Basophils Absolute: 0 10*3/uL (ref 0.0–0.1)
Basophils Relative: 0.7 % (ref 0.0–3.0)
Eosinophils Absolute: 0.1 10*3/uL (ref 0.0–0.7)
Eosinophils Relative: 0.7 % (ref 0.0–5.0)
HCT: 41.9 % (ref 39.0–52.0)
Hemoglobin: 14.4 g/dL (ref 13.0–17.0)
Lymphocytes Relative: 16.9 % (ref 12.0–46.0)
Lymphs Abs: 1.2 10*3/uL (ref 0.7–4.0)
MCHC: 34.5 g/dL (ref 30.0–36.0)
MCV: 104.2 fl — ABNORMAL HIGH (ref 78.0–100.0)
Monocytes Absolute: 0.5 10*3/uL (ref 0.1–1.0)
Monocytes Relative: 7.2 % (ref 3.0–12.0)
Neutro Abs: 5.2 10*3/uL (ref 1.4–7.7)
Neutrophils Relative %: 74.5 % (ref 43.0–77.0)
Platelets: 162 10*3/uL (ref 150.0–400.0)
RBC: 4.02 Mil/uL — AB (ref 4.22–5.81)
RDW: 13.8 % (ref 11.5–15.5)
WBC: 6.9 10*3/uL (ref 4.0–10.5)

## 2018-12-26 LAB — LIPID PANEL
CHOLESTEROL: 197 mg/dL (ref 0–200)
HDL: 68.3 mg/dL (ref >39.00)
NonHDL: 128.36
TRIGLYCERIDES: 208 mg/dL — AB (ref 0.0–149.0)
Total CHOL/HDL Ratio: 3
VLDL: 41.6 mg/dL — ABNORMAL HIGH (ref 0.0–40.0)

## 2018-12-26 LAB — LDL CHOLESTEROL, DIRECT: Direct LDL: 101 mg/dL

## 2018-12-26 LAB — COMPREHENSIVE METABOLIC PANEL
ALT: 28 U/L (ref 0–53)
AST: 40 U/L — ABNORMAL HIGH (ref 0–37)
Albumin: 4.3 g/dL (ref 3.5–5.2)
Alkaline Phosphatase: 62 U/L (ref 39–117)
BUN: 14 mg/dL (ref 6–23)
CO2: 28 mEq/L (ref 19–32)
Calcium: 9.6 mg/dL (ref 8.4–10.5)
Chloride: 101 mEq/L (ref 96–112)
Creatinine, Ser: 0.93 mg/dL (ref 0.40–1.50)
GFR: 81.28 mL/min (ref >60.00)
Glucose, Bld: 74 mg/dL (ref 70–99)
Potassium: 3.6 mEq/L (ref 3.5–5.1)
Sodium: 139 mEq/L (ref 135–145)
Total Bilirubin: 0.6 mg/dL (ref 0.2–1.2)
Total Protein: 7.2 g/dL (ref 6.0–8.3)

## 2018-12-26 LAB — HEMOGLOBIN A1C: Hgb A1c MFr Bld: 5 % (ref 4.6–6.5)

## 2018-12-26 LAB — VITAMIN D 25 HYDROXY (VIT D DEFICIENCY, FRACTURES): VITD: 47.01 ng/mL (ref 30.00–100.00)

## 2018-12-26 MED ORDER — LOSARTAN POTASSIUM-HCTZ 100-12.5 MG PO TABS: ORAL_TABLET | ORAL | 0 refills | Status: DC

## 2018-12-26 NOTE — Progress Notes (Addendum)
Subjective:   Lee Lee is a 68 y.o. male who presents for an Initial Medicare Annual Wellness Visit.  Review of Systems  No ROS.  Medicare Wellness Visit. Additional risk factors are reflected in the social history. Cardiac Risk Factors include: advanced age (>79mn, >>32women);hypertension;male gender Sleep patterns: feels rested on waking, gets up 1 times nightly to void and sleeps 7-8 hours nightly.    Home Safety/Smoke Alarms: Feels safe in home. Smoke alarms in place.  Living environment; residence and Firearm Safety: 2-story house, no firearms. Lives with wife, no needs for DME, good support system Seat Belt Safety/Bike Helmet: Wears seat belt.    Objective:    Today's Vitals   12/26/18 1228 12/26/18 1304  BP: (!) 141/76   Pulse: 65   Resp: 17   Temp: 98.7 F (37.1 C)   SpO2: 99%   Weight: 234 lb (106.1 kg)   Height: 5' 11"  (1.803 m)   PainSc:  2    Body mass index is 32.64 kg/m.  Advanced Directives 12/26/2018 09/21/2017 09/08/2017 07/04/2015 01/05/2013 01/05/2013  Does Patient Have a Medical Advance Directive? Yes Yes No Yes Patient has advance directive, copy not in chart -  Type of Advance [place]ive [place] - [place] will - -  Does patient want to make changes to medical advance directive? - - - No - Patient declined - -  Copy of [place] Chart? No - copy requested - - No - copy requested Copy requested from family -  Would patient like information on creating a medical advance directive? - No - Patient declined No - Patient declined - - -  Pre-existing out of facility DNR order (yellow form or pink MOST form) - - - - No No    Current Medications (verified) Outpatient Encounter Medications as of 12/26/2018  Medication Sig  . aspirin EC 81 MG tablet Take 1 tablet (81 mg total) by mouth daily.  .Marland Kitchengabapentin (NEURONTIN) 300 MG capsule     . L-Methylfolate-Algae (DEPLIN 15 PO) Take 1 tablet by mouth daily.   .Marland Kitchenlosartan-hydrochlorothiazide (HYZAAR) 100-12.5 MG tablet TAKE ONE TABLET BY MOUTH DAILY  . metoprolol succinate (TOPROL-XL) 25 MG 24 hr tablet Take 1 tablet (25 mg total) by mouth 2 (two) times daily.  . sildenafil (REVATIO) 20 MG tablet Take 20-100 mg by mouth as needed (ED).  . traZODone (DESYREL) 150 MG tablet Take 0.5 tablet by mouth daily at bedtime as needed for sleep  . vortioxetine HBr (TRINTELLIX) 10 MG TABS Take 10 mg by mouth daily.  . [DISCONTINUED] fluticasone (FLONASE) 50 MCG/ACT nasal spray Place 2 sprays into both nostrils daily. (Patient not taking: Reported on 12/26/2018)   No facility-administered encounter medications on file as of 12/26/2018.     Allergies (verified) Patient has no allergy information on record.   History: Past Medical History:  Diagnosis Date  . Alcohol abuse, episodic drinking behavior   . Arthritis    back & knees  . [place]    s/p RFCA 01/05/13  . Calcium oxalate renal stones   . Complication of anesthesia    makes him loopy  . ED (erectile dysfunction)   . Hemorrhoids   . Hypertension    Past Surgical History:  Procedure Laterality Date  . ABLATION OF DYSRHYTHMIC FOCUS  01/05/2013  . ARTHROSCOPIC REPAIR ACL    . ATRIAL FLUTTER ABLATION N/A 01/05/2013  Procedure: ATRIAL FLUTTER ABLATION;  Surgeon: Evans Lance, MD;  Location: [facility] CATH LAB;  Service: Cardiovascular;  Laterality: N/A;  . COLONOSCOPY  11/2009   Dr. Benson Norway  . ELECTROPHYSIOLOGIC STUDY N/A 07/04/2015   Procedure: A-Flutter Ablation;  Surgeon: Evans Lance, MD;  Location: [facility] CV LAB;  Service: Cardiovascular;  Laterality: N/A;  . ROTATOR CUFF REPAIR     Family History  Problem Relation Age of Onset  . Valvular heart disease Mother   . Lymphoma Mother    Social History   Socioeconomic History  . Marital status: Married    Spouse name: Not on file  . Number of children: Not on  file  . Years of education: Not on file  . Highest education level: Not on file  Occupational History  . Not on file  Social Needs  . Financial resource strain: Not hard at all  . Food insecurity:    Worry: Never true    Inability: Never true  . Transportation needs:    Medical: No    Non-medical: No  Tobacco Use  . Smoking status: Former Smoker    Last attempt to quit: 01/06/1988    Years since quitting: 30.9  . Smokeless tobacco: Current User    Types: Chew  . Tobacco comment: chews tobacco when playing golf only  Substance and Sexual Activity  . Alcohol use: Not Currently    Alcohol/week: 5.0 standard drinks    Types: 5 Glasses of wine per week    Comment: wine daily  . Drug use: No  . Sexual activity: Yes  Lifestyle  . Physical activity:    Days per week: 5 days    Minutes per session: 50 min  . Stress: Not at all  Relationships  . Social connections:    Talks on phone: More than three times a week    Gets together: More than three times a week    Attends religious service: Never    Active member of club or organization: Not on file    Attends meetings of clubs or organizations: Never    Relationship status: Not on file  Other Topics Concern  . Not on file  Social History Narrative  . Not on file   Tobacco Counseling Ready to quit: Not Answered Counseling given: Not Answered Comment: chews tobacco when playing golf only  Activities of Daily Living In your present state of health, do you have any difficulty performing the following activities: 12/26/2018  Hearing? N  Vision? N  Difficulty concentrating or making decisions? N  Walking or climbing stairs? N  Dressing or bathing? N  Doing errands, shopping? N  Preparing Food and eating ? N  Using the Toilet? N  In the past six months, have you accidently leaked urine? N  Do you have problems with loss of bowel control? N  Managing your Medications? N  Managing your Finances? N  Housekeeping or managing  your Housekeeping? N  Some recent data might be hidden     Immunizations and Health Maintenance Immunization History  Administered Date(s) Administered  . Influenza,inj,Quad PF,6+ Mos 10/31/2017  . Influenza-Unspecified 08/14/2016, 07/28/2018  . Pneumococcal Conjugate-13 04/28/2017  . Tdap 04/28/2017  . Zoster Recombinat (Shingrix) 10/10/2018   Health Maintenance Due  Topic Date Due  . PNA vac Low Risk Adult (2 of 2 - PPSV23) 04/28/2018    Patient Care Team: Janith Lima, MD as PCP - General (Internal Medicine) Larey Dresser, MD as Referring Physician (  Cardiology) Marybelle Killings, MD as Consulting Physician (Orthopedic Surgery) Evans Lance, MD as Consulting Physician (Cardiology)  Indicate any recent Medical Services you may have received from other than [facility] providers in the past year (date may be approximate).    Assessment:   This is a routine wellness examination for Kip. Physical assessment deferred to PCP.  Hearing/Vision screen Hearing Screening Comments: Able to hear conversational tones w/o difficulty. No issues reported.  Passed whisper test Vision Screening Comments: appointment yearly [facility] ophthalmology  Dietary issues and exercise activities discussed: Current Exercise Habits: Home exercise routine, Type of exercise: walking;strength training/weights, Time (Minutes): 50, Frequency (Times/Week): 4, Weekly Exercise (Minutes/Week): 200, Exercise limited by: orthopedic condition(s)  Diet (meal preparation, eat out, water intake, caffeinated beverages, dairy products, fruits and vegetables): in general, a "healthy" diet  , on average, 2 meals per day.   Reviewed heart healthy diet. Encouraged patient to maintain daily water and healthy fluid intake. Discussed weight loss tip and diet. Relevant patient education assigned to patient using Emmi.  Goals    . Patient Stated     I want to lose weight by starting monitor my diet low fat and low  carbohydrate foods.      Depression Screen PHQ 2/9 Scores 12/26/2018 04/28/2017  PHQ - 2 Score 3 0  PHQ- 9 Score 5 1    Fall Risk Fall Risk  12/26/2018 04/14/2018  Falls in the past year? 0 No   Cognitive Function: MMSE - Mini Mental State Exam 12/26/2018  Orientation to time 5  Orientation to Place 5  Registration 3  Attention/ Calculation 5  Recall 2  Language- name 2 objects 2  Language- repeat 1  Language- follow 3 step command 3  Language- read & follow direction 1  Write a sentence 1  Copy design 1  Total score 29        Screening Tests Health Maintenance  Topic Date Due  . PNA vac Low Risk Adult (2 of 2 - PPSV23) 04/28/2018  . COLONOSCOPY  04/13/2026  . TETANUS/TDAP  04/29/2027  . INFLUENZA VACCINE  Completed  . Hepatitis C Screening  Completed      Plan:    Reviewed health maintenance screenings with patient today and relevant education, vaccines, and/or referrals were provided.   Continue doing brain stimulating activities (puzzles, reading, adult coloring books, staying active) to keep memory sharp.   Continue to eat heart healthy diet (full of fruits, vegetables, whole grains, lean protein, water--limit salt, fat, and sugar intake) and increase physical activity as tolerated.  I have personally reviewed and noted the following in the patient's chart:   . Medical and social history . Use of alcohol, tobacco or illicit drugs  . Current medications and supplements . Functional ability and status . Nutritional status . Physical activity . Advanced directives . List of other physicians . Vitals . Screenings to include cognitive, depression, and falls . Referrals and appointments  In addition, I have reviewed and discussed with patient certain preventive protocols, quality metrics, and best practice recommendations. A written personalized care plan for preventive services as well as general preventive health recommendations were provided to patient.      Michiel Cowboy, RN   12/26/2018     Medical screening examination/treatment/procedure(s) were performed by non-physician practitioner and as supervising physician I was immediately available for consultation/collaboration. I agree with above. Scarlette Calico, MD

## 2018-12-26 NOTE — Progress Notes (Signed)
Subjective:  Patient ID: Lee Lee, male    DOB: [birth year]  Age: 68 y.o. MRN: [id number]  CC: Hypertension; Hyperlipidemia; and Allergic Rhinitis    HPI RASHAD AULD presents for f/up -- He tells me his blood pressure has recently been well controlled.  He is very [place] and denies any recent episodes of CP, DOE, palpitations, edema, or fatigue.  Outpatient Medications Prior to Visit  Medication Sig Dispense Refill  . aspirin EC 81 MG tablet Take 1 tablet (81 mg total) by mouth daily. 90 tablet 3  . gabapentin (NEURONTIN) 300 MG capsule     . metoprolol succinate (TOPROL-XL) 25 MG 24 hr tablet Take 1 tablet (25 mg total) by mouth 2 (two) times daily. 180 tablet 3  . sildenafil (REVATIO) 20 MG tablet Take 20-100 mg by mouth as needed (ED).    . traZODone (DESYREL) 150 MG tablet Take 0.5 tablet by mouth daily at bedtime as needed for sleep    . vortioxetine HBr (TRINTELLIX) 10 MG TABS Take 10 mg by mouth daily.    Marland Kitchen L-Methylfolate-Algae (DEPLIN 15 PO) Take 1 tablet by mouth daily.     Marland Kitchen losartan-hydrochlorothiazide (HYZAAR) 100-12.5 MG tablet TAKE ONE TABLET BY MOUTH DAILY 30 tablet 0   No facility-administered medications prior to visit.     ROS Review of Systems  Constitutional: Positive for unexpected weight change (wt gain). Negative for activity change, appetite change, diaphoresis and fatigue.  HENT: Positive for postnasal drip and rhinorrhea. Negative for congestion, nosebleeds, sinus pressure, sore throat and trouble swallowing.   Eyes: Negative for visual disturbance.  Respiratory: Negative for cough, chest tightness, shortness of breath and wheezing.   Cardiovascular: Negative for chest pain, palpitations and leg swelling.  Gastrointestinal: Negative for abdominal pain, anal bleeding, blood in stool, constipation, diarrhea, nausea and vomiting.  Endocrine: Negative.   Genitourinary: Negative.  Negative for difficulty urinating, penile  swelling, scrotal swelling, testicular pain and urgency.  Musculoskeletal: Negative.  Negative for arthralgias and myalgias.  Skin: Negative.  Negative for color change, pallor and rash.  Neurological: Negative.  Negative for dizziness, weakness, light-headedness and headaches.  Hematological: Negative for adenopathy. Does not bruise/bleed easily.  Psychiatric/Behavioral: Negative.     Objective:  BP 130/84   Pulse 65   Temp 98.7 F (37.1 C) (Oral)   Resp 16   Ht 5' 11"  (1.803 m)   Wt 234 lb (106.1 kg)   SpO2 99%   BMI 32.64 kg/m   BP Readings from Last 3 Encounters:  12/26/18 130/84  12/26/18 (!) 141/76  10/10/18 140/84    Wt Readings from Last 3 Encounters:  12/26/18 234 lb (106.1 kg)  12/26/18 234 lb (106.1 kg)  10/10/18 230 lb 12.8 oz (104.7 kg)    Physical Exam Vitals signs reviewed.  Constitutional:      Appearance: He is obese. He is not ill-appearing or diaphoretic.  HENT:     Nose: Nose normal. No congestion or rhinorrhea.     Right Nostril: No epistaxis.     Right Turbinates: Not enlarged, swollen or pale.     Left Turbinates: Not enlarged, swollen or pale.     Right Sinus: No maxillary sinus tenderness or frontal sinus tenderness.     Left Sinus: No maxillary sinus tenderness.     Mouth/Throat:     Mouth: Mucous membranes are moist.     Pharynx: Oropharynx is clear. No oropharyngeal exudate or posterior oropharyngeal erythema.  Eyes:  General: No scleral icterus.    Conjunctiva/sclera: Conjunctivae normal.  Neck:     Musculoskeletal: Normal range of motion and neck supple. No muscular tenderness.  Cardiovascular:     Rate and Rhythm: Normal rate and regular rhythm.     Pulses: Normal pulses.     Heart sounds: No murmur. No friction rub. No gallop.   Pulmonary:     Effort: Pulmonary effort is normal.     Breath sounds: No stridor. No wheezing, rhonchi or rales.  Abdominal:     General: Abdomen is flat.     Palpations: There is no hepatomegaly  or splenomegaly.     Tenderness: There is no abdominal tenderness.     Hernia: No hernia is present. There is no hernia in the right inguinal area or left inguinal area.  Genitourinary:    Pubic Area: No rash.      Penis: Normal and circumcised. No discharge, swelling or lesions.      Scrotum/Testes: Normal.        Right: Mass, tenderness or swelling not present.        Left: Mass, tenderness or swelling not present.     Epididymis:     Right: Normal. Not inflamed. No mass.     Left: Normal. Not inflamed. No mass.     Prostate: Enlarged (3+ smooth symm BPH). Not tender and no nodules present.     Rectum: Guaiac result positive. No mass, tenderness, anal fissure, external hemorrhoid or internal hemorrhoid. Normal anal tone.  Musculoskeletal: Normal range of motion.        General: No swelling.     Right lower leg: No edema.     Left lower leg: No edema.  Lymphadenopathy:     Cervical: No cervical adenopathy.     Lower Body: No right inguinal adenopathy. No left inguinal adenopathy.  Skin:    General: Skin is warm and dry.     Coloration: Skin is not pale.     Findings: No erythema or rash.  Neurological:     General: No focal deficit present.     Mental Status: He is oriented to person, place, and time. Mental status is at baseline.  Psychiatric:        Mood and Affect: Mood normal.        Behavior: Behavior normal.        Thought Content: Thought content normal.        Judgment: Judgment normal.     Lab Results  Component Value Date   WBC 6.9 12/26/2018   HGB 14.4 12/26/2018   HCT 41.9 12/26/2018   PLT 162.0 12/26/2018   GLUCOSE 74 12/26/2018   CHOL 197 12/26/2018   TRIG 208.0 (H) 12/26/2018   HDL 68.30 12/26/2018   LDLDIRECT 101.0 12/26/2018   LDLCALC 105 (H) 04/28/2017   ALT 28 12/26/2018   AST 40 (H) 12/26/2018   NA 139 12/26/2018   K 3.6 12/26/2018   CL 101 12/26/2018   CREATININE 0.93 12/26/2018   BUN 14 12/26/2018   CO2 28 12/26/2018   TSH 0.68 12/26/2018    PSA 3.15 12/26/2018   INR 1.05 09/24/2012   HGBA1C 5.0 12/26/2018    Dg Knee Complete 4 Views Right  Result Date: 09/08/2017 CLINICAL DATA:  Unable to extend leg, fell while blowing leaves in the yard, anterior RIGHT knee pain question patellar fracture versus quadriceps tendon rupture EXAM: RIGHT KNEE - COMPLETE 4+ VIEW COMPARISON:  None FINDINGS: Osseous mineralization normal. Joint  spaces preserved. Patellar spur formation at quadriceps tendon insertion. No acute fracture, dislocation or bone destruction. Disrupted soft tissue silhouette superior to the patella highly suspicious for a quadriceps tendon injury. IMPRESSION: No acute osseous abnormalities. Abnormal soft tissue silhouette superior to the patella highly suspicious for a quadriceps tendon injury; this can be better assessed by MR. Electronically Signed   By: Lavonia Dana M.D.   On: 09/08/2017 19:36   Xr Knee 3 View Left  Result Date: 09/09/2017 X-rays left knee demonstrates medial compartment arthritis bone-on-bone changes no acute fracture. Patellas normally located. There is some quadriceps calcification at the patellar tendon insertion site. Impression: Primarily medial compartment arthritis. Negative for acute fracture post fall.   Assessment & Plan:   Azul was seen today for hypertension, hyperlipidemia and allergic rhinitis .  Diagnoses and all orders for this visit:  Essential hypertension-  His blood pressure is well controlled.  Electrolytes and renal function are normal. -     CBC with Differential/Platelet; Future -     Comprehensive metabolic panel; Future -     TSH; Future -     Urinalysis, Routine w reflex microscopic; Future -     VITAMIN D 25 Hydroxy (Vit-D Deficiency, Fractures); Future -     losartan-hydrochlorothiazide (HYZAAR) 100-12.5 MG tablet; TAKE ONE TABLET BY MOUTH DAILY  Hyperlipidemia with target LDL less than 130- His ASCVD risk or is less than 15% so I do not recommend a statin for CV  risk reduction. -     Comprehensive metabolic panel; Future -     Lipid panel; Future -     TSH; Future  Hyperglycemia- His blood sugars are normal now. -     Hemoglobin A1c; Future  Benign prostatic hyperplasia without lower urinary tract symptoms- His PSA is in the normal range which is reassuring that he does not have prostate cancer.  He has no symptoms that need to be treated. -     PSA; Future  Ascending aortic aneurysm (HCC) -     CT ANGIO CHEST AORTA W/CM &/OR WO/CM; Future  Need for pneumococcal vaccination -     Pneumococcal polysaccharide vaccine 23-valent greater than or equal to 2yo subcutaneous/IM  Thoracic aortic aneurysm without rupture [facility] )- He is due for a repeat scan of this. -     Cancel: CT Angio Chest W/Cm &/Or Wo Cm; Future -     CT ANGIO CHEST AORTA W/CM &/OR WO/CM; Future  Occult blood in stools- I have asked him to see GI to see if he needs to undergo endoscopies to look for sources of blood loss. -     Ambulatory referral to Gastroenterology  Seasonal allergic rhinitis due to pollen -     levocetirizine (XYZAL) 5 MG tablet; Take 1 tablet (5 mg total) by mouth every evening. -     azelastine (ASTELIN) 0.1 % nasal spray; Place 1 spray into both nostrils 2 (two) times daily. Use in each nostril as directed   I have discontinued Caiden Arteaga. Gillin's L-Methylfolate-Algae (DEPLIN 15 PO). I am also having him start on levocetirizine and azelastine. Additionally, I am having him maintain his vortioxetine HBr, aspirin EC, traZODone, gabapentin, metoprolol succinate, sildenafil, and losartan-hydrochlorothiazide.  Meds ordered this encounter  Medications  . losartan-hydrochlorothiazide (HYZAAR) 100-12.5 MG tablet    Sig: TAKE ONE TABLET BY MOUTH DAILY    Dispense:  90 tablet    Refill:  0  . levocetirizine (XYZAL) 5 MG tablet    Sig: Take  1 tablet (5 mg total) by mouth every evening.    Dispense:  90 tablet    Refill:  1  . azelastine (ASTELIN) 0.1 % nasal  spray    Sig: Place 1 spray into both nostrils 2 (two) times daily. Use in each nostril as directed    Dispense:  90 mL    Refill:  1     Follow-up: Return in about 6 months (around 06/26/2019).  Scarlette Calico, MD

## 2018-12-26 NOTE — Patient Instructions (Addendum)
Continue doing brain stimulating activities (puzzles, reading, adult coloring books, staying active) to keep memory sharp.   Continue to eat heart healthy diet (full of fruits, vegetables, whole grains, lean protein, water--limit salt, fat, and sugar intake) and increase physical activity as tolerated.   Lee Lee , Thank you for taking time to come for your Medicare Wellness Visit. I appreciate your ongoing commitment to your health goals. Please review the following plan we discussed and let me know if I can assist you in the future.   These are the goals we discussed: Goals    . Patient Stated     I want to lose weight by starting monitor my diet low fat and low carbohydrate foods.       This is a list of the screening recommended for you and due dates:  Health Maintenance  Topic Date Due  . Pneumonia vaccines (2 of 2 - PPSV23) 04/28/2018  . Colon Cancer Screening  04/13/2026  . Tetanus Vaccine  04/29/2027  . Flu Shot  Completed  .  Hepatitis C: One time screening is recommended by Center for Disease Control  (CDC) for  adults born from [birth year] through 1963.   Completed    Preventive Care 66 Years and Older, Male Preventive care refers to lifestyle choices and visits with your health care provider that can promote health and wellness. What does preventive care include?   A yearly physical exam. This is also called an annual well check.  Dental exams once or twice a year.  Routine eye exams. Ask your health care provider how often you should have your eyes checked.  Personal lifestyle choices, including: ? Daily care of your teeth and gums. ? Regular physical activity. ? Eating a healthy diet. ? Avoiding tobacco and drug use. ? Limiting alcohol use. ? Practicing safe sex. ? Taking low doses of aspirin every day. ? Taking vitamin and mineral supplements as recommended by your health care provider. What happens during an annual well check? The services and screenings done  by your health care provider during your annual well check will depend on your age, overall health, lifestyle risk factors, and family history of disease. Counseling Your health care provider may ask you questions about your:  Alcohol use.  Tobacco use.  Drug use.  Emotional well-being.  Home and relationship well-being.  Sexual activity.  Eating habits.  History of falls.  Memory and ability to understand (cognition).  Work and work Statistician. Screening You may have the following tests or measurements:  Height, weight, and BMI.  Blood pressure.  Lipid and cholesterol levels. These may be checked every 5 years, or more frequently if you are over 66 years old.  Skin check.  Lung cancer screening. You may have this screening every year starting at age 66 if you have a 30-pack-year history of smoking and currently smoke or have quit within the past 15 years.  Colorectal cancer screening. All adults should have this screening starting at age 66 and continuing until age 108. You will have tests every 1-10 years, depending on your results and the type of screening test. People at increased risk should start screening at an earlier age. Screening tests may include: ? Guaiac-based fecal occult blood testing. ? Fecal immunochemical test (FIT). ? Stool DNA test. ? Virtual colonoscopy. ? Sigmoidoscopy. During this test, a flexible tube with a tiny camera (sigmoidoscope) is used to examine your rectum and lower colon. The sigmoidoscope is inserted through your anus into  your rectum and lower colon. ? Colonoscopy. During this test, a long, thin, flexible tube with a tiny camera (colonoscope) is used to examine your entire colon and rectum.  Prostate cancer screening. Recommendations will vary depending on your family history and other risks.  Hepatitis C blood test.  Hepatitis B blood test.  Sexually transmitted disease (STD) testing.  Diabetes screening. This is done by  checking your blood sugar (glucose) after you have not eaten for a while (fasting). You may have this done every 1-3 years.  Abdominal aortic aneurysm (AAA) screening. You may need this if you are a current or former smoker.  Osteoporosis. You may be screened starting at age 66 if you are at high risk. Talk with your health care provider about your test results, treatment options, and if necessary, the need for more tests. Vaccines Your health care provider may recommend certain vaccines, such as:  Influenza vaccine. This is recommended every year.  Tetanus, diphtheria, and acellular pertussis (Tdap, Td) vaccine. You may need a Td booster every 10 years.  Varicella vaccine. You may need this if you have not been vaccinated.  Zoster vaccine. You may need this after age 54.  Measles, mumps, and rubella (MMR) vaccine. You may need at least one dose of MMR if you were born in 1957 or later. You may also need a second dose.  Pneumococcal 13-valent conjugate (PCV13) vaccine. One dose is recommended after age 66.  Pneumococcal polysaccharide (PPSV23) vaccine. One dose is recommended after age 66.  Meningococcal vaccine. You may need this if you have certain conditions.  Hepatitis A vaccine. You may need this if you have certain conditions or if you travel or work in places where you may be exposed to hepatitis A.  Hepatitis B vaccine. You may need this if you have certain conditions or if you travel or work in places where you may be exposed to hepatitis B.  Haemophilus influenzae type b (Hib) vaccine. You may need this if you have certain risk factors. Talk to your health care provider about which screenings and vaccines you need and how often you need them. This information is not intended to replace advice given to you by your health care provider. Make sure you discuss any questions you have with your health care provider. Document Released: 12/12/2015 Document Revised: 01/05/2018  Document Reviewed: 09/16/2015 Elsevier Interactive Patient Education  2019 [employer].

## 2018-12-26 NOTE — Patient Instructions (Signed)

## 2018-12-27 ENCOUNTER — Encounter: Payer: Self-pay | Admitting: Internal Medicine

## 2018-12-27 DIAGNOSIS — R195 Other fecal abnormalities: Secondary | ICD-10-CM | POA: Insufficient documentation

## 2018-12-27 DIAGNOSIS — I712 Thoracic aortic aneurysm, without rupture, unspecified: Secondary | ICD-10-CM | POA: Insufficient documentation

## 2018-12-27 DIAGNOSIS — J301 Allergic rhinitis due to pollen: Secondary | ICD-10-CM | POA: Insufficient documentation

## 2018-12-27 MED ORDER — LEVOCETIRIZINE DIHYDROCHLORIDE 5 MG PO TABS: 5.0000 mg | ORAL_TABLET | Freq: Every evening | ORAL | 1 refills | Status: DC

## 2018-12-27 MED ORDER — AZELASTINE HCL 0.1 % NA SOLN: 1.0000 | Freq: Two times a day (BID) | NASAL | 1 refills | Status: DC

## 2018-12-29 ENCOUNTER — Encounter: Payer: Self-pay | Admitting: Internal Medicine

## 2019-01-08 ENCOUNTER — Encounter: Payer: Self-pay | Admitting: Internal Medicine

## 2019-01-10 ENCOUNTER — Ambulatory Visit (INDEPENDENT_AMBULATORY_CARE_PROVIDER_SITE_OTHER): Payer: PPO | Admitting: Family Medicine

## 2019-01-10 ENCOUNTER — Encounter: Payer: Self-pay | Admitting: Family Medicine

## 2019-01-10 VITALS — BP 150/82 | HR 56 | Temp 97.8°F | Ht 71.0 in | Wt 239.8 lb

## 2019-01-10 DIAGNOSIS — M545 Low back pain, unspecified: Secondary | ICD-10-CM | POA: Insufficient documentation

## 2019-01-10 MED ORDER — PREDNISONE 5 MG PO TABS: ORAL_TABLET | ORAL | 0 refills | Status: DC

## 2019-01-10 NOTE — Patient Instructions (Signed)
Nice to meet you  Please continue heat on the area  Please please try Aspercreme with lidocaine or salonpas on the area. Please try the medication  Please see me back in 2-3 weeks if no better.

## 2019-01-10 NOTE — Progress Notes (Signed)
Lee Lee - 68 y.o. male MRN [id number]  Date of birth: 1951/08/27  SUBJECTIVE:  Including CC & ROS.  Chief Complaint  Patient presents with  . Pain    lower back pain, feels like pulled muscle/ playing golf/ 1 wk    Lee Lee is a 68 y.o. male that is  Presenting with rihgt sided lower back pain. Occurred a week ago after playing golf. Pain is worse with moving in certain positions. No significant sciatica. Play golf three times per week and has been playing more tennis. Pain is intermittent. Pain is moderate. Some improvement with aleve. No prior back surgery. Feels like the pain is staying the same.    Review of Systems  Constitutional: Negative for fever.  HENT: Negative for congestion.   Respiratory: Negative for cough.   Cardiovascular: Negative for chest pain.  Gastrointestinal: Negative for abdominal pain.  Musculoskeletal: Positive for back pain.  Skin: Negative for color change.  Neurological: Negative for weakness.  Hematological: Negative for adenopathy.  Psychiatric/Behavioral: Negative for agitation.    HISTORY: Past Medical, Surgical, Social, and Family History Reviewed & Updated per EMR.   Pertinent Historical Findings include:  Past Medical History:  Diagnosis Date  . Alcohol abuse, episodic drinking behavior   . Arthritis    back & knees  . Atrial flutter ([facility])    s/p RFCA 01/05/13  . Calcium oxalate renal stones   . Complication of anesthesia    makes him loopy  . ED (erectile dysfunction)   . Hemorrhoids   . Hypertension     Past Surgical History:  Procedure Laterality Date  . ABLATION OF DYSRHYTHMIC FOCUS  01/05/2013  . ARTHROSCOPIC REPAIR ACL    . ATRIAL FLUTTER ABLATION N/A 01/05/2013   Procedure: ATRIAL FLUTTER ABLATION;  Surgeon: Evans Lance, MD;  Location: [facility] CATH LAB;  Service: Cardiovascular;  Laterality: N/A;  . COLONOSCOPY  11/2009   Dr. Benson Norway  . ELECTROPHYSIOLOGIC STUDY N/A 07/04/2015   Procedure: A-Flutter Ablation;   Surgeon: Evans Lance, MD;  Location: [facility] CV LAB;  Service: Cardiovascular;  Laterality: N/A;  . ROTATOR CUFF REPAIR      Not on File  Family History  Problem Relation Age of Onset  . Valvular heart disease Mother   . Lymphoma Mother      Social History   Socioeconomic History  . Marital status: Married    Spouse name: Not on file  . Number of children: Not on file  . Years of education: Not on file  . Highest education level: Not on file  Occupational History  . Not on file  Social Needs  . Financial resource strain: Not hard at all  . Food insecurity:    Worry: Never true    Inability: Never true  . Transportation needs:    Medical: No    Non-medical: No  Tobacco Use  . Smoking status: Former Smoker    Last attempt to quit: 01/06/1988    Years since quitting: 31.0  . Smokeless tobacco: Current User    Types: Chew  . Tobacco comment: chews tobacco when playing golf only  Substance and Sexual Activity  . Alcohol use: Not Currently    Alcohol/week: 5.0 standard drinks    Types: 5 Glasses of wine per week    Comment: wine daily  . Drug use: No  . Sexual activity: Yes  Lifestyle  . Physical activity:    Days per week: 5 days  Minutes per session: 50 min  . Stress: Not at all  Relationships  . Social connections:    Talks on phone: More than three times a week    Gets together: More than three times a week    Attends religious service: Never    Active member of club or organization: Not on file    Attends meetings of clubs or organizations: Never    Relationship status: Not on file  . Intimate partner violence:    Fear of current or ex partner: Not on file    Emotionally abused: Not on file    Physically abused: Not on file    Forced sexual activity: Not on file  Other Topics Concern  . Not on file  Social History Narrative  . Not on file     PHYSICAL EXAM:  VS: BP (!) 150/82   Pulse (!) 56   Temp 97.8 F (36.6 C)   Ht 5' 11"  (1.803 m)    Wt 239 lb 12.8 oz (108.8 kg)   SpO2 96%   BMI 33.45 kg/m  Physical Exam Gen: NAD, alert, cooperative with exam, well-appearing ENT: normal lips, normal nasal mucosa,  Eye: normal EOM, normal conjunctiva and lids CV:  no edema, +2 pedal pulses   Resp: no accessory muscle use, non-labored,  Skin: no rashes, no areas of induration  Neuro: normal tone, normal sensation to touch Psych:  normal insight, alert and oriented MSK:  Back/Right hip:  Mild TTP of the lower paraspinal right back muscle  No midline tenderness  Normal flexion  Normal IR and ER of the hip  Normal strength to resistance with hip flexion, knee flexion and extension and dorsalflexion and plantarflexion  Negative SLR b/l  Normal gait  Neurovascularly intact       ASSESSMENT & PLAN:   Acute right-sided low back pain without sciatica Seem to have a muscular spasm vs strain. Plays golf three x a week regular but has started playing tennis. No significant sciatica.  - prednisone  - provided samples of pennsaid  - counseled on HEP and supportive care - if no improvement consider PT, imaging or trigger point injections.

## 2019-01-10 NOTE — Assessment & Plan Note (Signed)
Seem to have a muscular spasm vs strain. Plays golf three x a week regular but has started playing tennis. No significant sciatica.  - prednisone  - provided samples of pennsaid  - counseled on HEP and supportive care - if no improvement consider PT, imaging or trigger point injections.

## 2019-01-16 DIAGNOSIS — G4700 Insomnia, unspecified: Secondary | ICD-10-CM | POA: Diagnosis not present

## 2019-01-16 DIAGNOSIS — F331 Major depressive disorder, recurrent, moderate: Secondary | ICD-10-CM | POA: Diagnosis not present

## 2019-01-16 DIAGNOSIS — F411 Generalized anxiety disorder: Secondary | ICD-10-CM | POA: Diagnosis not present

## 2019-01-17 ENCOUNTER — Ambulatory Visit (INDEPENDENT_AMBULATORY_CARE_PROVIDER_SITE_OTHER)
Admission: RE | Admit: 2019-01-17 | Discharge: 2019-01-17 | Disposition: A | Discharge status: Home / Self Care | Payer: PPO | Source: Ambulatory Visit | Attending: Internal Medicine | Admitting: Internal Medicine

## 2019-01-17 ENCOUNTER — Encounter: Payer: Self-pay | Admitting: Internal Medicine

## 2019-01-17 ENCOUNTER — Other Ambulatory Visit: Payer: Self-pay | Admitting: Internal Medicine

## 2019-01-17 DIAGNOSIS — I712 Thoracic aortic aneurysm, without rupture, unspecified: Secondary | ICD-10-CM

## 2019-01-17 DIAGNOSIS — I7121 Aneurysm of the ascending aorta, without rupture: Secondary | ICD-10-CM

## 2019-01-17 MED ORDER — IOPAMIDOL (ISOVUE-370) INJECTION 76%
75.0000 mL | Freq: Once | INTRAVENOUS | Status: AC | PRN
  Administered 2019-01-17: 75 mL via INTRAVENOUS

## 2019-01-29 ENCOUNTER — Encounter: Payer: Self-pay | Admitting: Internal Medicine

## 2019-02-19 ENCOUNTER — Encounter: Payer: Self-pay | Admitting: Internal Medicine

## 2019-03-24 ENCOUNTER — Other Ambulatory Visit: Payer: Self-pay | Admitting: Internal Medicine

## 2019-03-24 DIAGNOSIS — I10 Essential (primary) hypertension: Secondary | ICD-10-CM

## 2019-05-07 DIAGNOSIS — F331 Major depressive disorder, recurrent, moderate: Secondary | ICD-10-CM | POA: Diagnosis not present

## 2019-05-07 DIAGNOSIS — F411 Generalized anxiety disorder: Secondary | ICD-10-CM | POA: Diagnosis not present

## 2019-06-05 ENCOUNTER — Encounter: Payer: Self-pay | Admitting: Internal Medicine

## 2019-07-08 ENCOUNTER — Other Ambulatory Visit: Payer: Self-pay | Admitting: Internal Medicine

## 2019-07-08 DIAGNOSIS — J301 Allergic rhinitis due to pollen: Secondary | ICD-10-CM

## 2019-07-08 DIAGNOSIS — I10 Essential (primary) hypertension: Secondary | ICD-10-CM

## 2019-07-09 ENCOUNTER — Telehealth: Payer: Self-pay | Admitting: Internal Medicine

## 2019-07-09 NOTE — Telephone Encounter (Signed)
New Message   Pt c/o Shortness Of Breath: STAT if SOB developed within the last 24 hours or pt is noticeably SOB on the phone  1. Are you currently SOB (can you hear that pt is SOB on the phone)? Patient states that he is not short of breath now but has it when he exercises or any type of active where he has to exert him self. Patient did not sound sob   2. How long have you been experiencing SOB? On and off for a few weeks  3. Are you SOB when sitting or when up moving around? Moving   4. Are you currently experiencing any other symptoms? Sometimes his HR will go up to 90-100.     Patient will be coming in on 8/13 to see Oda Kilts as well.

## 2019-07-11 ENCOUNTER — Encounter: Payer: Self-pay | Admitting: Adult Health

## 2019-07-11 ENCOUNTER — Ambulatory Visit (INDEPENDENT_AMBULATORY_CARE_PROVIDER_SITE_OTHER): Payer: PPO | Admitting: Adult Health

## 2019-07-11 ENCOUNTER — Other Ambulatory Visit: Payer: Self-pay

## 2019-07-11 VITALS — Wt 223.0 lb

## 2019-07-11 DIAGNOSIS — F331 Major depressive disorder, recurrent, moderate: Secondary | ICD-10-CM | POA: Diagnosis not present

## 2019-07-11 DIAGNOSIS — F411 Generalized anxiety disorder: Secondary | ICD-10-CM

## 2019-07-11 DIAGNOSIS — G4700 Insomnia, unspecified: Secondary | ICD-10-CM

## 2019-07-11 MED ORDER — VORTIOXETINE HBR 20 MG PO TABS: 20.0000 mg | ORAL_TABLET | Freq: Every day | ORAL | 1 refills | Status: DC

## 2019-07-11 MED ORDER — L-METHYLFOLATE 15 MG PO TABS: 15.0000 mg | ORAL_TABLET | Freq: Every day | ORAL | 1 refills | Status: DC

## 2019-07-11 MED ORDER — TRAZODONE HCL 50 MG PO TABS: 50.0000 mg | ORAL_TABLET | Freq: Every day | ORAL | 1 refills | Status: DC

## 2019-07-11 MED ORDER — BUSPIRONE HCL 10 MG PO TABS: 10.0000 mg | ORAL_TABLET | Freq: Two times a day (BID) | ORAL | 1 refills | Status: DC

## 2019-07-11 NOTE — Progress Notes (Signed)
Lee Lee [id number] [birth year] 68 y.o.  Subjective:   Patient ID:  Lee Lee is a 69 y.o. (DOB Jul 24, 1951) male.  Chief Complaint:  Chief Complaint  Patient presents with  . Depression  . Anxiety  . Insomnia    HPI Lee Lee presents to the office today for follow-up of anxiety, insomnia, and depression.  Describes mood as "not the best". Mood symptoms - feels depressed and anxious. Denies irritability. Increased anxiety over past few months. Unable to identify precipitant. Stating "I just wake up that way". Waking up 3 to 4 mornings a week feeling "very nervous and anxious". Symptoms last until noon. Feels like he needs something to "calm" him down.  Saw PCP for a checkup and all was WNL. Appointment with cardiologist tomorrow for shortness of breath and increased heart rate.  Energy levels down - "not great". Active, has a regular exercise routine. Playing a lot of golf 3 to 4 times a week - "trying to stay hydrated" Enjoys some usual interests. Spending time with wife. Son and daughter live out of state.  Appetite decreased. Weight loss. Stating "I'm not hungry". Has lost 16 pounds over past few months with increased anxiety. Sleeps well most nights. Averages 8 hours.  Focus and concentration stable. Completing tasks. Managing aspects of household. Retired.  Denies SI or HI. Denies AH or VH.  Review of Systems:  Review of Systems  Musculoskeletal: Negative for gait problem.  Neurological: Negative for tremors.  Psychiatric/Behavioral: The patient is nervous/anxious.        Please refer to HPI   Physical Exam  Constitutional: He is oriented to person, place, and time.  Neurological: He is alert and oriented to person, place, and time. Gait normal.  Psychiatric: Memory, affect and judgment normal. His mood appears anxious.    Medications: I have reviewed the patient's current medications.  Current Outpatient Medications  Medication Sig Dispense  Refill  . aspirin EC 81 MG tablet Take 1 tablet (81 mg total) by mouth daily. 90 tablet 3  . azelastine (ASTELIN) 0.1 % nasal spray Place 1 spray into both nostrils 2 (two) times daily. Use in each nostril as directed 90 mL 1  . gabapentin (NEURONTIN) 300 MG capsule     . losartan-hydrochlorothiazide (HYZAAR) 100-12.5 MG tablet TAKE ONE TABLET BY MOUTH DAILY 90 tablet 0  . sildenafil (REVATIO) 20 MG tablet Take 20-100 mg by mouth as needed (ED).    . traZODone (DESYREL) 50 MG tablet Take 1 tablet (50 mg total) by mouth at bedtime. 90 tablet 1  . vortioxetine HBr (TRINTELLIX) 20 MG TABS tablet Take 1 tablet (20 mg total) by mouth daily. 90 tablet 1  . busPIRone (BUSPAR) 10 MG tablet Take 1 tablet (10 mg total) by mouth 2 (two) times daily. 180 tablet 1  . carvedilol (COREG) 12.5 MG tablet Take 1 tablet (12.5 mg total) by mouth 2 (two) times daily with a meal. 60 tablet 3  . L-Methylfolate 15 MG TABS Take 1 tablet (15 mg total) by mouth daily. 90 tablet 1  . levocetirizine (XYZAL) 5 MG tablet Take 1 tablet (5 mg total) by mouth every evening. 90 tablet 1   No current facility-administered medications for this visit.     Medication Side Effects: None  Allergies: No Known Allergies  Past Medical History:  Diagnosis Date  . Alcohol abuse, episodic drinking behavior   . Arthritis    back & knees  . Atrial flutter ([place])  s/p RFCA 01/05/13  . Calcium oxalate renal stones   . Complication of anesthesia    makes him loopy  . ED (erectile dysfunction)   . Hemorrhoids   . Hypertension     Family History  Problem Relation Age of Onset  . Valvular heart disease Mother   . Lymphoma Mother     Social History   Socioeconomic History  . Marital status: Married    Spouse name: Not on file  . Number of children: Not on file  . Years of education: Not on file  . Highest education level: Not on file  Occupational History  . Occupation: Retired  Scientific laboratory technician  . Financial resource strain:  Not hard at all  . Food insecurity    Worry: Never true    Inability: Never true  . Transportation needs    Medical: No    Non-medical: No  Tobacco Use  . Smoking status: Former Smoker    Quit date: 01/06/1988    Years since quitting: 31.5  . Smokeless tobacco: Current User    Types: Chew  . Tobacco comment: chews tobacco when playing golf only  Substance and Sexual Activity  . Alcohol use: Not Currently    Alcohol/week: 5.0 standard drinks    Types: 5 Glasses of wine per week    Comment: wine daily  . Drug use: No  . Sexual activity: Yes  Lifestyle  . Physical activity    Days per week: 5 days    Minutes per session: 50 min  . Stress: Not at all  Relationships  . Social connections    Talks on phone: More than three times a week    Gets together: More than three times a week    Attends religious service: Never    Active member of club or organization: Not on file    Attends meetings of clubs or organizations: Never    Relationship status: Not on file  . Intimate partner violence    Fear of current or ex partner: Not on file    Emotionally abused: Not on file    Physically abused: Not on file    Forced sexual activity: Not on file  Other Topics Concern  . Not on file  Social History Narrative  . Not on file    Past Medical History, Surgical history, Social history, and Family history were reviewed and updated as appropriate.   Please see review of systems for further details on the patient's review from today.   Objective:   Physical Exam:  Wt 223 lb (101.2 kg)   BMI 31.10 kg/m   Mental status exam; he is alert, orient to time, person and place. Normal thought content, speech, affect, mood and dress are noted.  Lab Review:     Component Value Date/Time   NA 140 07/12/2019 0909   K 4.0 07/12/2019 0909   CL 100 07/12/2019 0909   CO2 24 07/12/2019 0909   GLUCOSE 184 (H) 07/12/2019 0909   GLUCOSE 74 12/26/2018 1342   BUN 12 07/12/2019 0909   CREATININE  0.97 07/12/2019 0909   CALCIUM 9.7 07/12/2019 0909   PROT 7.2 12/26/2018 1342   ALBUMIN 4.3 12/26/2018 1342   AST 40 (H) 12/26/2018 1342   ALT 28 12/26/2018 1342   ALKPHOS 62 12/26/2018 1342   BILITOT 0.6 12/26/2018 1342   GFRNONAA 81 07/12/2019 0909   GFRAA 94 07/12/2019 0909       Component Value Date/Time   WBC 6.2  07/12/2019 0909   WBC 6.9 12/26/2018 1342   RBC 3.80 (L) 07/12/2019 0909   RBC 4.02 (L) 12/26/2018 1342   HGB 13.5 07/12/2019 0909   HCT 38.7 07/12/2019 0909   PLT 134 (L) 07/12/2019 0909   MCV 102 (H) 07/12/2019 0909   MCH 35.5 (H) 07/12/2019 0909   MCH 34.9 (H) 09/24/2012 1705   MCHC 34.9 07/12/2019 0909   MCHC 34.5 12/26/2018 1342   RDW 11.8 07/12/2019 0909   LYMPHSABS 1.2 12/26/2018 1342   MONOABS 0.5 12/26/2018 1342   EOSABS 0.1 12/26/2018 1342   BASOSABS 0.0 12/26/2018 1342    No results found for: POCLITH, LITHIUM   No results found for: PHENYTOIN, PHENOBARB, VALPROATE, CBMZ   .res Assessment: Plan:    Plan:   1. Add Buspar 54m BID to target increased anxiety. 2. Continue Trintellix 250mdaily 3. Continue Trazadone 5067mt hs 4. Continue Deplin 45m86mily  RTC 4 weeks  Patient advised to contact office with any questions, adverse effects, or acute worsening in signs and symptoms.  StepMuhannad seen today for depression, anxiety and insomnia.  Diagnoses and all orders for this visit:  Generalized anxiety disorder -     vortioxetine HBr (TRINTELLIX) 20 MG TABS tablet; Take 1 tablet (20 mg total) by mouth daily. -     busPIRone (BUSPAR) 10 MG tablet; Take 1 tablet (10 mg total) by mouth 2 (two) times daily. -     L-Methylfolate 15 MG TABS; Take 1 tablet (15 mg total) by mouth daily.  Major depressive disorder, recurrent episode, moderate (HCC) -     vortioxetine HBr (TRINTELLIX) 20 MG TABS tablet; Take 1 tablet (20 mg total) by mouth daily. -     L-Methylfolate 15 MG TABS; Take 1 tablet (15 mg total) by mouth daily.  Insomnia,  unspecified type -     traZODone (DESYREL) 50 MG tablet; Take 1 tablet (50 mg total) by mouth at bedtime.     Please see After Visit Summary for patient specific instructions.  Future Appointments  Date Time Provider [place]18/2020  8:35 AM [facility] ECHO 5 [facility]  08/08/2019  8:00 AM [place] Sabine, RegiBerdie Ogren CP-CP None  08/10/2019  8:00 AM [place], MichSatira Mccallum-C [facility]    No orders of the defined types were placed in this encounter.   -------------------------------

## 2019-07-12 ENCOUNTER — Ambulatory Visit: Payer: PPO | Admitting: Student

## 2019-07-12 ENCOUNTER — Other Ambulatory Visit: Payer: Self-pay

## 2019-07-12 VITALS — BP 150/74 | HR 61 | Ht 71.0 in | Wt 230.0 lb

## 2019-07-12 DIAGNOSIS — R0600 Dyspnea, unspecified: Secondary | ICD-10-CM

## 2019-07-12 LAB — BASIC METABOLIC PANEL
BUN/Creatinine Ratio: 12 (ref 10–24)
BUN: 12 mg/dL (ref 8–27)
CO2: 24 mmol/L (ref 20–29)
Calcium: 9.7 mg/dL (ref 8.6–10.2)
Chloride: 100 mmol/L (ref 96–106)
Creatinine, Ser: 0.97 mg/dL (ref 0.76–1.27)
GFR calc Af Amer: 94 mL/min/{1.73_m2} (ref >59)
GFR calc non Af Amer: 81 mL/min/{1.73_m2} (ref >59)
Glucose: 184 mg/dL — ABNORMAL HIGH (ref 65–99)
Potassium: 4 mmol/L (ref 3.5–5.2)
Sodium: 140 mmol/L (ref 134–144)

## 2019-07-12 LAB — CBC
Hematocrit: 38.7 % (ref 37.5–51.0)
Hemoglobin: 13.5 g/dL (ref 13.0–17.7)
MCH: 35.5 pg — ABNORMAL HIGH (ref 26.6–33.0)
MCHC: 34.9 g/dL (ref 31.5–35.7)
MCV: 102 fL — ABNORMAL HIGH (ref 79–97)
Platelets: 134 10*3/uL — ABNORMAL LOW (ref 150–450)
RBC: 3.8 x10E6/uL — ABNORMAL LOW (ref 4.14–5.80)
RDW: 11.8 % (ref 11.6–15.4)
WBC: 6.2 10*3/uL (ref 3.4–10.8)

## 2019-07-12 MED ORDER — CARVEDILOL 12.5 MG PO TABS: 12.5000 mg | ORAL_TABLET | Freq: Two times a day (BID) | ORAL | 3 refills | Status: DC

## 2019-07-12 NOTE — Patient Instructions (Addendum)
Medication Instructions:   STOP TAKING METOPROLOL    START TAKING CARVEDILOL 12.5 MG TWICE A DAY   If you need a refill on your cardiac medications before your next appointment, please call your pharmacy.   Lab work:  BMET AND CBC TODAY   If you have labs (blood work) drawn today and your tests are completely normal, you will receive your results only by: Marland Kitchen MyChart Message (if you have MyChart) OR . A paper copy in the mail If you have any lab test that is abnormal or we need to change your treatment, we will call you to review the results.  Testing/Procedures: Your physician has requested that you have an echocardiogram. Echocardiography is a painless test that uses sound waves to create images of your heart. It provides your doctor with information about the size and shape of your heart and how well your heart's chambers and valves are working. This procedure takes approximately one hour. There are no restrictions for this procedure.  Your physician has requested that you have an exercise tolerance test. For further information please visit [email or web address]. Please also follow instruction sheet, as given. @ [place] after 07-12-18 covid screen   Follow-Up: At [facility], you and your health needs are our priority.  As part of our continuing mission to provide you with exceptional heart care, we have created designated Provider Care Teams.  These Care Teams include your primary Cardiologist (physician) and Advanced Practice Providers (APPs -  Physician Assistants and Nurse Practitioners) who all work together to provide you with the care you need, when you need it. You will need a follow up appointment in 4-6  weeks with Joesph July PA-C    Any Other Special Instructions Will Be Listed Below (If Applicable).

## 2019-07-12 NOTE — Progress Notes (Addendum)
PCP:  Janith Lima, MD Primary Cardiologist: No primary care provider on file. Electrophysiologist: Dr. Jamie Kato Lee Lee is a 68 y.o. male who presents today for add on due to chest discomfort. They are seen for Dr [name].   Since last being seen in our clinic, the patient reports doing well overall until around May. Since that time, he complains of decreased exercise tolerance and dyspnea on exertion, at times accompanied by chest pressure.  The pressure is in the center of his chest and improves rapidly with rest.  Last year he could golf 18 holes without difficulty, and now has to take frequent breaks after ~14 holes. He gets by with his ADLs OK, but becomes SOB with moderate exertion, including stairs. He does not smoke.   He denies symptoms of palpitations, orthopnea, PND, lower extremity edema, claudication, dizziness, presyncope, syncope, bleeding, or neurologic sequela. The patient is tolerating medications without difficulties.    Past Medical History:  Diagnosis Date  . Alcohol abuse, episodic drinking behavior   . Arthritis    back & knees  . Atrial flutter ([place])    s/p RFCA 01/05/13  . Calcium oxalate renal stones   . Complication of anesthesia    makes him loopy  . ED (erectile dysfunction)   . Hemorrhoids   . Hypertension    Past Surgical History:  Procedure Laterality Date  . ABLATION OF DYSRHYTHMIC FOCUS  01/05/2013  . ARTHROSCOPIC REPAIR ACL    . ATRIAL FLUTTER ABLATION N/A 01/05/2013   Procedure: ATRIAL FLUTTER ABLATION;  Surgeon: Evans Lance, MD;  [facility] C[facility] LAB;  Service: Cardiovascular;  Laterality: N/A;  . COLONOSCOPY  11/2009   Dr. Benson Norway  . ELECTROPHYSIOLOGIC STUDY N/A 07/04/2015   Procedure: A-Flutter Ablation;  Surgeon: Evans Lance, MD;  Location: [facility] CV LAB;  Service: Cardiovascular;  Laterality: N/A;  . ROTATOR CUFF REPAIR      Current Outpatient Medications  Medication Sig Dispense Refill  . aspirin EC 81 MG tablet Take 1  tablet (81 mg total) by mouth daily. 90 tablet 3  . azelastine (ASTELIN) 0.1 % nasal spray Place 1 spray into both nostrils 2 (two) times daily. Use in each nostril as directed 90 mL 1  . busPIRone (BUSPAR) 10 MG tablet Take 1 tablet (10 mg total) by mouth 2 (two) times daily. 180 tablet 1  . gabapentin (NEURONTIN) 300 MG capsule     . L-Methylfolate 15 MG TABS Take 1 tablet (15 mg total) by mouth daily. 90 tablet 1  . levocetirizine (XYZAL) 5 MG tablet Take 1 tablet (5 mg total) by mouth every evening. 90 tablet 1  . losartan-hydrochlorothiazide (HYZAAR) 100-12.5 MG tablet TAKE ONE TABLET BY MOUTH DAILY 90 tablet 0  . sildenafil (REVATIO) 20 MG tablet Take 20-100 mg by mouth as needed (ED).    . traZODone (DESYREL) 50 MG tablet Take 1 tablet (50 mg total) by mouth at bedtime. 90 tablet 1  . vortioxetine HBr (TRINTELLIX) 20 MG TABS tablet Take 1 tablet (20 mg total) by mouth daily. 90 tablet 1  . carvedilol (COREG) 12.5 MG tablet Take 1 tablet (12.5 mg total) by mouth 2 (two) times daily with a meal. 60 tablet 3   No current facility-administered medications for this visit.     No Known Allergies  Social History   Socioeconomic History  . Marital status: Married    Spouse name: Not on file  . Number of children: Not on file  .  Years of education: Not on file  . Highest education level: Not on file  Occupational History  . Occupation: Retired  Scientific laboratory technician  . Financial resource strain: Not hard at all  . Food insecurity    Worry: Never true    Inability: Never true  . Transportation needs    Medical: No    Non-medical: No  Tobacco Use  . Smoking status: Former Smoker    Quit date: 01/06/1988    Years since quitting: 31.5  . Smokeless tobacco: Current User    Types: Chew  . Tobacco comment: chews tobacco when playing golf only  Substance and Sexual Activity  . Alcohol use: Not Currently    Alcohol/week: 5.0 standard drinks    Types: 5 Glasses of wine per week    Comment:  wine daily  . Drug use: No  . Sexual activity: Yes  Lifestyle  . Physical activity    Days per week: 5 days    Minutes per session: 50 min  . Stress: Not at all  Relationships  . Social connections    Talks on phone: More than three times a week    Gets together: More than three times a week    Attends religious service: Never    Active member of club or organization: Not on file    Attends meetings of clubs or organizations: Never    Relationship status: Not on file  . Intimate partner violence    Fear of current or ex partner: Not on file    Emotionally abused: Not on file    Physically abused: Not on file    Forced sexual activity: Not on file  Other Topics Concern  . Not on file  Social History Narrative  . Not on file     Review of Systems: General: No chills, fever, night sweats or weight changes  Cardiovascular:  No chest pain, dyspnea on exertion, edema, orthopnea, palpitations, paroxysmal nocturnal dyspnea Dermatological: No rash, lesions or masses Respiratory: No cough, dyspnea Urologic: No hematuria, dysuria Abdominal: No nausea, vomiting, diarrhea, bright red blood per rectum, melena, or hematemesis Neurologic: No visual changes, weakness, changes in mental status All other systems reviewed and are otherwise negative except as noted above.  Physical Exam: Vitals:   07/12/19 0826  BP: (!) 150/74  Pulse: 61  Weight: 230 lb (104.3 kg)  Height: 5' 11"  (1.803 m)    GEN- The patient is well appearing, alert and oriented x 3 today.   HEENT: normocephalic, atraumatic; sclera clear, conjunctiva pink; hearing intact; oropharynx clear; neck supple, no JVP Lymph- no cervical lymphadenopathy Lungs- Clear to ausculation bilaterally, normal work of breathing.  No wheezes, rales, rhonchi Heart- Regular rate and rhythm, no murmurs, rubs or gallops, PMI not laterally displaced GI- soft, non-tender, non-distended, bowel sounds present, no hepatosplenomegaly Extremities-  no clubbing, cyanosis, or edema; DP/PT/radial pulses 2+ bilaterally MS- no significant deformity or atrophy Skin- warm and dry, no rash or lesion Psych- euthymic mood, full affect Neuro- strength and sensation are intact  EKG is ordered today Personal review shows NSR 61 bpm with no ST or T wave abnormalities.   Assessment and Plan:  1. Chest discomfort With decreased exercise tolerance and dyspnea on exertion. It is brought on by activity and improved by rest.  Will schedule for treadmill stress test and Echo. Pt will get COVID testing prior to stress test.  He has a negative COVID screening today.   2. SVT s/p ablation of parahisian AT  by Dr. [name] Denies palpitations  3. HTN Elevated. Will change Toprol to coreg 12.5 mg BID for better BP control. If EF low, will adjust meds accordingly.   Will plan to see back in 4-6 weeks to discuss results.   Shirley Friar, PA-C  07/12/19 10:26 AM

## 2019-07-13 ENCOUNTER — Other Ambulatory Visit (HOSPITAL_COMMUNITY)
Admission: RE | Admit: 2019-07-13 | Discharge: 2019-07-13 | Disposition: A | Discharge status: Home / Self Care | Payer: PPO | Source: Ambulatory Visit | Attending: Student | Admitting: Student

## 2019-07-13 DIAGNOSIS — Z01812 Encounter for preprocedural laboratory examination: Secondary | ICD-10-CM | POA: Diagnosis not present

## 2019-07-13 DIAGNOSIS — Z20828 Contact with and (suspected) exposure to other viral communicable diseases: Secondary | ICD-10-CM | POA: Insufficient documentation

## 2019-07-13 LAB — SARS CORONAVIRUS 2 (TAT 6-24 HRS): SARS Coronavirus 2: NEGATIVE

## 2019-07-15 ENCOUNTER — Encounter: Payer: Self-pay | Admitting: Adult Health

## 2019-07-17 ENCOUNTER — Other Ambulatory Visit: Payer: Self-pay

## 2019-07-17 ENCOUNTER — Telehealth (HOSPITAL_COMMUNITY): Payer: Self-pay | Admitting: *Deleted

## 2019-07-17 ENCOUNTER — Ambulatory Visit (HOSPITAL_COMMUNITY): Payer: PPO | Attending: Cardiology

## 2019-07-17 ENCOUNTER — Inpatient Hospital Stay (HOSPITAL_COMMUNITY): Admission: RE | Admit: 2019-07-17 | Payer: PPO | Source: Ambulatory Visit

## 2019-07-17 DIAGNOSIS — R0600 Dyspnea, unspecified: Secondary | ICD-10-CM | POA: Insufficient documentation

## 2019-07-17 MED ORDER — PERFLUTREN LIPID MICROSPHERE
1.0000 mL | INTRAVENOUS | Status: AC | PRN
  Administered 2019-07-17: 2 mL via INTRAVENOUS

## 2019-07-17 NOTE — Telephone Encounter (Signed)
Close encounter 

## 2019-07-19 ENCOUNTER — Other Ambulatory Visit: Payer: Self-pay

## 2019-07-19 ENCOUNTER — Ambulatory Visit (HOSPITAL_COMMUNITY)
Admission: RE | Admit: 2019-07-19 | Discharge: 2019-07-19 | Disposition: A | Discharge status: Home / Self Care | Payer: PPO | Source: Ambulatory Visit | Attending: Cardiology | Admitting: Cardiology

## 2019-07-19 DIAGNOSIS — R0600 Dyspnea, unspecified: Secondary | ICD-10-CM | POA: Diagnosis not present

## 2019-07-19 LAB — EXERCISE TOLERANCE TEST
Estimated workload: 9.8 METS
Exercise duration (min): 7 min
Exercise duration (sec): 51 s
MPHR: 154 {beats}/min
Peak HR: 129 {beats}/min
Percent HR: 83 %
RPE: 18
Rest HR: 69 {beats}/min

## 2019-08-08 ENCOUNTER — Other Ambulatory Visit: Payer: Self-pay

## 2019-08-08 ENCOUNTER — Ambulatory Visit (INDEPENDENT_AMBULATORY_CARE_PROVIDER_SITE_OTHER): Payer: PPO | Admitting: Adult Health

## 2019-08-08 ENCOUNTER — Encounter: Payer: Self-pay | Admitting: Adult Health

## 2019-08-08 DIAGNOSIS — F331 Major depressive disorder, recurrent, moderate: Secondary | ICD-10-CM

## 2019-08-08 DIAGNOSIS — G4700 Insomnia, unspecified: Secondary | ICD-10-CM

## 2019-08-08 DIAGNOSIS — F411 Generalized anxiety disorder: Secondary | ICD-10-CM | POA: Diagnosis not present

## 2019-08-08 MED ORDER — BUSPIRONE HCL 15 MG PO TABS: ORAL_TABLET | ORAL | 1 refills | Status: DC

## 2019-08-08 NOTE — Progress Notes (Signed)
Lee Lee [id number] 11-07-1951 67 y.o.   Virtual Visit via Telephone Note  I connected withpt on 08/07/19 at  9:30 AM EDT by telephoneand verified that I am speaking with the correct person using two identifiers.  I discussed the limitations, risks, security and privacy concerns of performing an evaluation and management service by telephone and the availability of in person appointments. I also discussed with the patient that there may be a patient responsible charge related to this service. The patient expressed understanding and agreed to proceed.  I discussed the assessment and treatment plan with the patient. The patient was provided an opportunity to ask questions and all were answered. The patient agreed with the plan and demonstrated an understanding of the instructions.  The patient was advised to call back or seek an in-person evaluation if the symptoms worsen or if the condition fails to improve as anticipated.  I provided 30 minutes of non-face-to-face time during this encounter.  The patient was located at home.  The provider was located at [place].   Subjective:    Patient ID:  Lee Lee is a 68 y.o. (DOB 1950-12-02) male.  Chief Complaint:  No chief complaint on file.   HPI SHERRELL FARISH presents via telehealth today for follow-up of anxiety, insomnia, and depression.  Describes mood today as "better". Mood symptoms - decreased depression and anxiety. Denies irritability. Feels like addition of Buspar has been helpful - "anxiety decreased by 50%". Would like to try an increase. Stable interest and motivation. He and wife doing well. Taking medications as prescribed.  Energy levels "about the same". Active, has a regular exercise routine. Played golf over the weekend. Retired. Enjoys some usual interests. Spending time with wife. Son and daughter live out of state - calling and visiting.  Appetite decreased. Weight has stabilized  - "maintaining".  Sleeps well most nights. Averages 7 to 8 hours.  Focus and concentration stable. Completing tasks. Managing aspects of household. Retired.  Denies SI or HI. Denies AH or VH.  Review of Systems:  Review of Systems  Musculoskeletal: Negative for gait problem.  Neurological: Negative for tremors.  Psychiatric/Behavioral:       Please refer to HPI   Physical Exam  Constitutional: He is oriented to person, place, and time.  Neurological: He is alert and oriented to person, place, and time.  Psychiatric: Memory, affect and judgment normal. His mood appears anxious.    Medications: I have reviewed the patient's current medications.  Current Outpatient Medications  Medication Sig Dispense Refill  . aspirin EC 81 MG tablet Take 1 tablet (81 mg total) by mouth daily. 90 tablet 3  . azelastine (ASTELIN) 0.1 % nasal spray Place 1 spray into both nostrils 2 (two) times daily. Use in each nostril as directed 90 mL 1  . busPIRone (BUSPAR) 15 MG tablet Take one tablet twice daily. 180 tablet 1  . carvedilol (COREG) 12.5 MG tablet Take 1 tablet (12.5 mg total) by mouth 2 (two) times daily with a meal. 60 tablet 3  . gabapentin (NEURONTIN) 300 MG capsule     . L-Methylfolate 15 MG TABS Take 1 tablet (15 mg total) by mouth daily. 90 tablet 1  . levocetirizine (XYZAL) 5 MG tablet Take 1 tablet (5 mg total) by mouth every evening. 90 tablet 1  . losartan-hydrochlorothiazide (HYZAAR) 100-12.5 MG tablet TAKE ONE TABLET BY MOUTH DAILY 90 tablet 0  . sildenafil (REVATIO) 20 MG tablet Take 20-100 mg by mouth as  needed (ED).    . traZODone (DESYREL) 50 MG tablet Take 1 tablet (50 mg total) by mouth at bedtime. 90 tablet 1  . vortioxetine HBr (TRINTELLIX) 20 MG TABS tablet Take 1 tablet (20 mg total) by mouth daily. 90 tablet 1   No current facility-administered medications for this visit.     Medication Side Effects: None  Allergies: No Known Allergies  Past Medical History:   Diagnosis Date  . Alcohol abuse, episodic drinking behavior   . Anxiety   . Arthritis    back & knees  . Atrial flutter ([place])    s/p RFCA 01/05/13  . Calcium oxalate renal stones   . Complication of anesthesia    makes him loopy  . Depression   . ED (erectile dysfunction)   . Hemorrhoids   . Hypertension     Family History  Problem Relation Age of Onset  . Valvular heart disease Mother   . Lymphoma Mother     Social History   Socioeconomic History  . Marital status: Married    Spouse name: Not on file  . Number of children: Not on file  . Years of education: Not on file  . Highest education level: Not on file  Occupational History  . Occupation: Retired  Scientific laboratory technician  . Financial resource strain: Not hard at all  . Food insecurity    Worry: Never true    Inability: Never true  . Transportation needs    Medical: No    Non-medical: No  Tobacco Use  . Smoking status: Former Smoker    Quit date: 01/06/1988    Years since quitting: 31.6  . Smokeless tobacco: Current User    Types: Chew  . Tobacco comment: chews tobacco when playing golf only  Substance and Sexual Activity  . Alcohol use: Not Currently    Alcohol/week: 5.0 standard drinks    Types: 5 Glasses of wine per week    Comment: wine daily  . Drug use: No  . Sexual activity: Yes  Lifestyle  . Physical activity    Days per week: 5 days    Minutes per session: 50 min  . Stress: Not at all  Relationships  . Social connections    Talks on phone: More than three times a week    Gets together: More than three times a week    Attends religious service: Never    Active member of club or organization: Not on file    Attends meetings of clubs or organizations: Never    Relationship status: Not on file  . Intimate partner violence    Fear of current or ex partner: Not on file    Emotionally abused: Not on file    Physically abused: Not on file    Forced sexual activity: Not on file  Other Topics Concern   . Not on file  Social History Narrative  . Not on file    Past Medical History, Surgical history, Social history, and Family history were reviewed and updated as appropriate.   Please see review of systems for further details on the patient's review from today.   Objective:   Physical Exam:  There were no vitals taken for this visit.  Mental status exam; he is alert, orient to time, person and place. Normal thought content, speech, affect, mood and dress are noted.  Lab Review:     Component Value Date/Time   NA 140 07/12/2019 0909   K 4.0 07/12/2019 0909  CL 100 07/12/2019 0909   CO2 24 07/12/2019 0909   GLUCOSE 184 (H) 07/12/2019 0909   GLUCOSE 74 12/26/2018 1342   BUN 12 07/12/2019 0909   CREATININE 0.97 07/12/2019 0909   CALCIUM 9.7 07/12/2019 0909   PROT 7.2 12/26/2018 1342   ALBUMIN 4.3 12/26/2018 1342   AST 40 (H) 12/26/2018 1342   ALT 28 12/26/2018 1342   ALKPHOS 62 12/26/2018 1342   BILITOT 0.6 12/26/2018 1342   GFRNONAA 81 07/12/2019 0909   GFRAA 94 07/12/2019 0909       Component Value Date/Time   WBC 6.2 07/12/2019 0909   WBC 6.9 12/26/2018 1342   RBC 3.80 (L) 07/12/2019 0909   RBC 4.02 (L) 12/26/2018 1342   HGB 13.5 07/12/2019 0909   HCT 38.7 07/12/2019 0909   PLT 134 (L) 07/12/2019 0909   MCV 102 (H) 07/12/2019 0909   MCH 35.5 (H) 07/12/2019 0909   MCH 34.9 (H) 09/24/2012 1705   MCHC 34.9 07/12/2019 0909   MCHC 34.5 12/26/2018 1342   RDW 11.8 07/12/2019 0909   LYMPHSABS 1.2 12/26/2018 1342   MONOABS 0.5 12/26/2018 1342   EOSABS 0.1 12/26/2018 1342   BASOSABS 0.0 12/26/2018 1342    No results found for: POCLITH, LITHIUM   No results found for: PHENYTOIN, PHENOBARB, VALPROATE, CBMZ   .res Assessment: Plan:    Plan:   1. Increase Buspar 45m to 183mBID to target increased anxiety. 2. Continue Trintellix 2052maily 3. Continue Trazadone 68m20m hs 4. Continue Deplin 15mg15mly  RTC 2 months  Patient advised to contact office  with any questions, adverse effects, or acute worsening in signs and symptoms.  Diagnoses and all orders for this visit:  Generalized anxiety disorder -     Discontinue: busPIRone (BUSPAR) 15 MG tablet; Take one tablet twice daily. -     busPIRone (BUSPAR) 15 MG tablet; Take one tablet twice daily.  Major depressive disorder, recurrent episode, moderate (HCC)  Insomnia, unspecified type     Please see After Visit Summary for patient specific instructions.  Future Appointments  Date Time Provider DeparMalcolm1/2020  8:00 AM Tillery, MichaSatira MccallumC [facility]    No orders of the defined types were placed in this encounter.   -------------------------------

## 2019-08-09 NOTE — Progress Notes (Signed)
PCP:  Janith Lima, MD Primary Cardiologist: No primary care provider on file. Electrophysiologist: None   Lee Lee is a 68 y.o. male who presents today for routine electrophysiology followup. They are seen for Dr [name].   Since last being seen in our clinic, the patient reports doing well.  His chest pressure has resolved, though his exercise intolerance remains diminished.  He remains SOB and fatigued with minimal exercise, most notably after playing golf.    He snores, but had a previously "borderline sleep study".  He does not monitor his BP at home, elevated on arrival today. He did take his medications.   The patient feels that he is tolerating medications without difficulties.  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  ETT 07/19/19 with normal BP response, no ST segment elevation, no T wave inversion. Negative stress test.   Past Medical History:  Diagnosis Date  . Alcohol abuse, episodic drinking behavior   . Anxiety   . Arthritis    back & knees  . Atrial flutter ([place])    s/p RFCA 01/05/13  . Calcium oxalate renal stones   . Complication of anesthesia    makes him loopy  . Depression   . ED (erectile dysfunction)   . Hemorrhoids   . Hypertension    Past Surgical History:  Procedure Laterality Date  . ABLATION OF DYSRHYTHMIC FOCUS  01/05/2013  . ARTHROSCOPIC REPAIR ACL    . ATRIAL FLUTTER ABLATION N/A 01/05/2013   Procedure: ATRIAL FLUTTER ABLATION;  Surgeon: Evans Lance, MD;  Location: [facility] CATH LAB;  Service: Cardiovascular;  Laterality: N/A;  . COLONOSCOPY  11/2009   Dr. Benson Norway  . ELECTROPHYSIOLOGIC STUDY N/A 07/04/2015   Procedure: A-Flutter Ablation;  Surgeon: Evans Lance, MD;  Location: [facility] CV LAB;  Service: Cardiovascular;  Laterality: N/A;  . ROTATOR CUFF REPAIR      Current Outpatient Medications  Medication Sig Dispense Refill  . aspirin EC 81 MG tablet Take 1 tablet (81 mg total)  by mouth daily. 90 tablet 3  . azelastine (ASTELIN) 0.1 % nasal spray Place 1 spray into both nostrils 2 (two) times daily. Use in each nostril as directed 90 mL 1  . busPIRone (BUSPAR) 15 MG tablet Take one tablet twice daily. 180 tablet 1  . carvedilol (COREG) 12.5 MG tablet Take 1 tablet (12.5 mg total) by mouth 2 (two) times daily with a meal. 60 tablet 3  . gabapentin (NEURONTIN) 300 MG capsule     . L-Methylfolate 15 MG TABS Take 1 tablet (15 mg total) by mouth daily. 90 tablet 1  . levocetirizine (XYZAL) 5 MG tablet Take 1 tablet (5 mg total) by mouth every evening. 90 tablet 1  . losartan-hydrochlorothiazide (HYZAAR) 100-12.5 MG tablet TAKE ONE TABLET BY MOUTH DAILY 90 tablet 0  . sildenafil (REVATIO) 20 MG tablet Take 20-100 mg by mouth as needed (ED).    . traZODone (DESYREL) 50 MG tablet Take 1 tablet (50 mg total) by mouth at bedtime. 90 tablet 1  . vortioxetine HBr (TRINTELLIX) 20 MG TABS tablet Take 1 tablet (20 mg total) by mouth daily. 90 tablet 1   No current facility-administered medications for this visit.     No Known Allergies  Social History   Socioeconomic History  . Marital status: Married    Spouse name: Not on file  . Number of children: Not on file  . Years of  education: Not on file  . Highest education level: Not on file  Occupational History  . Occupation: Retired  Scientific laboratory technician  . Financial resource strain: Not hard at all  . Food insecurity    Worry: Never true    Inability: Never true  . Transportation needs    Medical: No    Non-medical: No  Tobacco Use  . Smoking status: Former Smoker    Quit date: 01/06/1988    Years since quitting: 31.6  . Smokeless tobacco: Current User    Types: Chew  . Tobacco comment: chews tobacco when playing golf only  Substance and Sexual Activity  . Alcohol use: Not Currently    Alcohol/week: 5.0 standard drinks    Types: 5 Glasses of wine per week    Comment: wine daily  . Drug use: No  . Sexual activity: Yes   Lifestyle  . Physical activity    Days per week: 5 days    Minutes per session: 50 min  . Stress: Not at all  Relationships  . Social connections    Talks on phone: More than three times a week    Gets together: More than three times a week    Attends religious service: Never    Active member of club or organization: Not on file    Attends meetings of clubs or organizations: Never    Relationship status: Not on file  . Intimate partner violence    Fear of current or ex partner: Not on file    Emotionally abused: Not on file    Physically abused: Not on file    Forced sexual activity: Not on file  Other Topics Concern  . Not on file  Social History Narrative  . Not on file     Review of Systems: General: No chills, fever, night sweats or weight changes  Cardiovascular:  No chest pain, dyspnea on exertion, edema, orthopnea, palpitations, paroxysmal nocturnal dyspnea Dermatological: No rash, lesions or masses Respiratory: No cough, dyspnea Urologic: No hematuria, dysuria Abdominal: No nausea, vomiting, diarrhea, bright red blood per rectum, melena, or hematemesis Neurologic: No visual changes, weakness, changes in mental status All other systems reviewed and are otherwise negative except as noted above.  Physical Exam: Vitals:   08/10/19 0758 08/10/19 0815  BP: (!) 174/93 (!) 150/86  Pulse: (!) 58   Weight: 228 lb (103.4 kg)   Height: 5' 11"  (1.803 m)     GEN- The patient is well appearing, alert and oriented x 3 today.   HEENT: normocephalic, atraumatic; sclera clear, conjunctiva pink; hearing intact; oropharynx clear; neck supple, no JVP Lymph- no cervical lymphadenopathy Lungs- Clear to ausculation bilaterally, normal work of breathing.  No wheezes, rales, rhonchi Heart- Regular rate and rhythm, no murmurs, rubs or gallops, PMI not laterally displaced GI- soft, non-tender, non-distended, bowel sounds present, no hepatosplenomegaly Extremities- no clubbing, cyanosis,  or edema; DP/PT/radial pulses 2+ bilaterally MS- no significant deformity or atrophy Skin- warm and dry, no rash or lesion Psych- euthymic mood, full affect Neuro- strength and sensation are intact  EKG is not ordered today.  Assessment and Plan:  1. Chest discomfort Normal echo, negative stress test He has had no further.  Can try OTC PPI if recurs with normal stress trest.  2. SVT s/p ablation of parahisian AT by Dr. [name] Denies palpitations  3. HTN Continue coreg 12.5 mg BID He wants to monitor at home for now.  Asked him to take 1-2 daily BPs and call  if > 140.  Consider low dose amlodipine with borderline bradycardia.   Stable from EP perspective.  RTC 1 year to see Dr. [name].   Shirley Friar, PA-C  08/10/19 8:08 AM

## 2019-08-10 ENCOUNTER — Other Ambulatory Visit: Payer: Self-pay

## 2019-08-10 ENCOUNTER — Ambulatory Visit: Payer: PPO | Admitting: Student

## 2019-08-10 ENCOUNTER — Encounter: Payer: Self-pay | Admitting: Student

## 2019-08-10 VITALS — BP 150/86 | HR 58 | Ht 71.0 in | Wt 228.0 lb

## 2019-08-10 DIAGNOSIS — I10 Essential (primary) hypertension: Secondary | ICD-10-CM | POA: Diagnosis not present

## 2019-08-10 DIAGNOSIS — R0600 Dyspnea, unspecified: Secondary | ICD-10-CM | POA: Diagnosis not present

## 2019-08-10 DIAGNOSIS — I471 Supraventricular tachycardia: Secondary | ICD-10-CM | POA: Diagnosis not present

## 2019-08-24 ENCOUNTER — Other Ambulatory Visit: Payer: Self-pay | Admitting: Internal Medicine

## 2019-08-24 DIAGNOSIS — I10 Essential (primary) hypertension: Secondary | ICD-10-CM

## 2019-09-10 ENCOUNTER — Encounter: Payer: Self-pay | Admitting: Adult Health

## 2019-09-10 ENCOUNTER — Ambulatory Visit (INDEPENDENT_AMBULATORY_CARE_PROVIDER_SITE_OTHER): Payer: PPO | Admitting: Adult Health

## 2019-09-10 ENCOUNTER — Other Ambulatory Visit: Payer: Self-pay

## 2019-09-10 DIAGNOSIS — F3181 Bipolar II disorder: Secondary | ICD-10-CM | POA: Diagnosis not present

## 2019-09-10 DIAGNOSIS — G4700 Insomnia, unspecified: Secondary | ICD-10-CM | POA: Diagnosis not present

## 2019-09-10 DIAGNOSIS — F411 Generalized anxiety disorder: Secondary | ICD-10-CM | POA: Diagnosis not present

## 2019-09-10 DIAGNOSIS — F331 Major depressive disorder, recurrent, moderate: Secondary | ICD-10-CM

## 2019-09-10 MED ORDER — LATUDA 20 MG PO TABS: 20.0000 mg | ORAL_TABLET | Freq: Every day | ORAL | 0 refills | Status: DC

## 2019-09-10 NOTE — Progress Notes (Signed)
Lee Lee [id number] 25-Jan-1951 68 y.o.   Subjective:    Patient ID:  Lee Lee is a 68 y.o. (DOB 1950-12-06) male.  Chief Complaint:  Chief Complaint  Patient presents with  . Anxiety  . Depression  . Insomnia    HPI Lee Lee presents today for follow-up of anxiety, insomnia, and depression.  Describes mood today as "not the best". Mood symptoms - reports depression and irritability. Denies anxiety - "Buspar working well". Stating "I can't get past this depression I'm having". Does not feel like Trintellix is working "like it did". Also stating, these past 2 years have been hard on me". Lost both parents, wasn't ready to retire, can't see his 2 children or grandchildren, hasn't seen "new" granddaughter. Son in [place] and his husband have adopted a daughter. Daughter in [place] has an 97 month old son he hasn't seen since February, sex life non-existent, possible knee surgery upcoming. Wanting to stay in bed. Maintains sobriety. Decreased interest and motivation. Taking medications as prescribed.  Energy levels lower. Active, has a regular exercise routine - "exercising when I can". Playing golf. Retired. Enjoys some usual interests. Spending time with wife. Talking to son and daughter. Appetite decreased. Weight loss - 16 pounds - 222.  Sleeps well "when I get to sleep". Wakes up and can't get back to sleep. Averages 6 hours.  Focus and concentration stable. Completing tasks. Managing aspects of household.  Denies SI or HI. Denies AH or VH.  Review of Systems:  Review of Systems  Musculoskeletal: Positive for gait problem.  Psychiatric/Behavioral: Positive for sleep disturbance.       Please refer to HPI   Physical Exam  Constitutional: He is oriented to person, place, and time.  Neurological: He is alert and oriented to person, place, and time.  Psychiatric: Memory and judgment normal. His mood appears not anxious. He exhibits a depressed mood. He has  a flat affect.    Medications: I have reviewed the patient's current medications.  Current Outpatient Medications  Medication Sig Dispense Refill  . aspirin EC 81 MG tablet Take 1 tablet (81 mg total) by mouth daily. 90 tablet 3  . azelastine (ASTELIN) 0.1 % nasal spray Place 1 spray into both nostrils 2 (two) times daily. Use in each nostril as directed 90 mL 1  . busPIRone (BUSPAR) 15 MG tablet Take one tablet twice daily. 180 tablet 1  . carvedilol (COREG) 12.5 MG tablet Take 1 tablet (12.5 mg total) by mouth 2 (two) times daily with a meal. 60 tablet 3  . gabapentin (NEURONTIN) 300 MG capsule     . L-Methylfolate 15 MG TABS Take 1 tablet (15 mg total) by mouth daily. 90 tablet 1  . levocetirizine (XYZAL) 5 MG tablet Take 1 tablet (5 mg total) by mouth every evening. 90 tablet 1  . losartan-hydrochlorothiazide (HYZAAR) 100-12.5 MG tablet TAKE ONE TABLET BY MOUTH DAILY 90 tablet 0  . sildenafil (REVATIO) 20 MG tablet Take 20-100 mg by mouth as needed (ED).    . traZODone (DESYREL) 50 MG tablet Take 1 tablet (50 mg total) by mouth at bedtime. 90 tablet 1  . vortioxetine HBr (TRINTELLIX) 20 MG TABS tablet Take 1 tablet (20 mg total) by mouth daily. 90 tablet 1   No current facility-administered medications for this visit.     Medication Side Effects: None  Allergies: No Known Allergies  Past Medical History:  Diagnosis Date  . Alcohol abuse, episodic drinking behavior   .  Anxiety   . Arthritis    back & knees  . Atrial flutter ([place])    s/p RFCA 01/05/13  . Calcium oxalate renal stones   . Complication of anesthesia    makes him loopy  . Depression   . ED (erectile dysfunction)   . Hemorrhoids   . Hypertension     Family History  Problem Relation Age of Onset  . Valvular heart disease Mother   . Lymphoma Mother     Social History   Socioeconomic History  . Marital status: Married    Spouse name: Not on file  . Number of children: Not on file  . Years of education:  Not on file  . Highest education level: Not on file  Occupational History  . Occupation: Retired  Scientific laboratory technician  . Financial resource strain: Not hard at all  . Food insecurity    Worry: Never true    Inability: Never true  . Transportation needs    Medical: No    Non-medical: No  Tobacco Use  . Smoking status: Former Smoker    Quit date: 01/06/1988    Years since quitting: 31.6  . Smokeless tobacco: Current User    Types: Chew  . Tobacco comment: chews tobacco when playing golf only  Substance and Sexual Activity  . Alcohol use: Not Currently    Alcohol/week: 5.0 standard drinks    Types: 5 Glasses of wine per week    Comment: wine daily  . Drug use: No  . Sexual activity: Yes  Lifestyle  . Physical activity    Days per week: 5 days    Minutes per session: 50 min  . Stress: Not at all  Relationships  . Social connections    Talks on phone: More than three times a week    Gets together: More than three times a week    Attends religious service: Never    Active member of club or organization: Not on file    Attends meetings of clubs or organizations: Never    Relationship status: Not on file  . Intimate partner violence    Fear of current or ex partner: Not on file    Emotionally abused: Not on file    Physically abused: Not on file    Forced sexual activity: Not on file  Other Topics Concern  . Not on file  Social History Narrative  . Not on file    Past Medical History, Surgical history, Social history, and Family history were reviewed and updated as appropriate.   Please see review of systems for further details on the patient's review from today.   Objective:   Physical Exam:  There were no vitals taken for this visit.  Mental status exam; he is alert, orient to time, person and place. Normal thought content, speech, affect, mood and dress are noted.  Lab Review:     Component Value Date/Time   NA 140 07/12/2019 0909   K 4.0 07/12/2019 0909   CL 100  07/12/2019 0909   CO2 24 07/12/2019 0909   GLUCOSE 184 (H) 07/12/2019 0909   GLUCOSE 74 12/26/2018 1342   BUN 12 07/12/2019 0909   CREATININE 0.97 07/12/2019 0909   CALCIUM 9.7 07/12/2019 0909   PROT 7.2 12/26/2018 1342   ALBUMIN 4.3 12/26/2018 1342   AST 40 (H) 12/26/2018 1342   ALT 28 12/26/2018 1342   ALKPHOS 62 12/26/2018 1342   BILITOT 0.6 12/26/2018 1342   GFRNONAA 81 07/12/2019  5056   GFRAA 94 07/12/2019 0909       Component Value Date/Time   WBC 6.2 07/12/2019 0909   WBC 6.9 12/26/2018 1342   RBC 3.80 (L) 07/12/2019 0909   RBC 4.02 (L) 12/26/2018 1342   HGB 13.5 07/12/2019 0909   HCT 38.7 07/12/2019 0909   PLT 134 (L) 07/12/2019 0909   MCV 102 (H) 07/12/2019 0909   MCH 35.5 (H) 07/12/2019 0909   MCH 34.9 (H) 09/24/2012 1705   MCHC 34.9 07/12/2019 0909   MCHC 34.5 12/26/2018 1342   RDW 11.8 07/12/2019 0909   LYMPHSABS 1.2 12/26/2018 1342   MONOABS 0.5 12/26/2018 1342   EOSABS 0.1 12/26/2018 1342   BASOSABS 0.0 12/26/2018 1342    No results found for: POCLITH, LITHIUM   No results found for: PHENYTOIN, PHENOBARB, VALPROATE, CBMZ   .res Assessment: Plan:    Plan:   1. Continue Buspar 33m BID  2. Continue Trintellix 219mdaily 3. Continue Trazadone 5064mt hs 4. Continue Deplin 45m68mily 5. Add Latuda 20mg5mly - samples given for 4 weeks to target depression.  RTC 4 weeks  Patient advised to contact office with any questions, adverse effects, or acute worsening in signs and symptoms.  StephMalcomseen today for anxiety, depression and insomnia.  Diagnoses and all orders for this visit:  Generalized anxiety disorder  Major depressive disorder, recurrent episode, moderate (HCC)  Insomnia, unspecified type  Bipolar II disorder (HCC) [place] Lee Please see After Visit Summary for patient specific instructions.  Future Appointments  Date Time Provider DeparDeephaven19/2020  9:30 AM WagonTrula Slade [facility]  11/12/2019   8:00 AM Rashaun Curl, ReginBerdie OgrenCP-[facility] None    No orders of the defined types were placed in this encounter.   -------------------------------

## 2019-09-12 DIAGNOSIS — M1712 Unilateral primary osteoarthritis, left knee: Secondary | ICD-10-CM | POA: Diagnosis not present

## 2019-09-12 DIAGNOSIS — M25562 Pain in left knee: Secondary | ICD-10-CM | POA: Diagnosis not present

## 2019-09-17 ENCOUNTER — Ambulatory Visit: Payer: PPO | Admitting: Podiatry

## 2019-09-17 ENCOUNTER — Ambulatory Visit (INDEPENDENT_AMBULATORY_CARE_PROVIDER_SITE_OTHER): Payer: PPO

## 2019-09-17 ENCOUNTER — Encounter: Payer: Self-pay | Admitting: Podiatry

## 2019-09-17 ENCOUNTER — Other Ambulatory Visit: Payer: Self-pay

## 2019-09-17 VITALS — BP 163/93 | HR 59 | Resp 16

## 2019-09-17 DIAGNOSIS — R234 Changes in skin texture: Secondary | ICD-10-CM

## 2019-09-17 DIAGNOSIS — M9262 Juvenile osteochondrosis of tarsus, left ankle: Secondary | ICD-10-CM

## 2019-09-17 DIAGNOSIS — M9261 Juvenile osteochondrosis of tarsus, right ankle: Secondary | ICD-10-CM

## 2019-09-17 DIAGNOSIS — M7730 Calcaneal spur, unspecified foot: Secondary | ICD-10-CM | POA: Diagnosis not present

## 2019-09-17 DIAGNOSIS — M7662 Achilles tendinitis, left leg: Secondary | ICD-10-CM | POA: Diagnosis not present

## 2019-09-17 DIAGNOSIS — L84 Corns and callosities: Secondary | ICD-10-CM | POA: Diagnosis not present

## 2019-09-17 DIAGNOSIS — L97511 Non-pressure chronic ulcer of other part of right foot limited to breakdown of skin: Secondary | ICD-10-CM

## 2019-09-17 MED ORDER — MUPIROCIN 2 % EX OINT: 1.0000 "application " | TOPICAL_OINTMENT | Freq: Two times a day (BID) | CUTANEOUS | 2 refills | Status: DC

## 2019-09-18 NOTE — Progress Notes (Signed)
Subjective:   Patient ID: Lee Lee, male   DOB: 68 y.o.   MRN: [id number]   HPI 68 year old male presents the office today for concerns of callus areas to both of his feet and plantar submetatarsal 1 as well as the right hallux.  Is been ongoing for several years and will sometimes bleed.  States he is active in golf and walking for exercise no good regular regular pedicures.  He has tried over-the-counter lotions but no significant improvement.  He is also having discomfort in the posterior aspect of the left heel which is been aching for the last 1 month he has noticed a "knot" on the area.  He states it does not hurt enough to take any medication for it he said no treatment.  Review of Systems  All other systems reviewed and are negative.  Past Medical History:  Diagnosis Date  . Alcohol abuse, episodic drinking behavior   . Anxiety   . Arthritis    back & knees  . Atrial flutter ([place])    s/p RFCA 01/05/13  . Calcium oxalate renal stones   . Complication of anesthesia    makes him loopy  . Depression   . ED (erectile dysfunction)   . Hemorrhoids   . Hypertension     Past Surgical History:  Procedure Laterality Date  . ABLATION OF DYSRHYTHMIC FOCUS  01/05/2013  . ARTHROSCOPIC REPAIR ACL    . ATRIAL FLUTTER ABLATION N/A 01/05/2013   Procedure: ATRIAL FLUTTER ABLATION;  Surgeon: Evans Lance, MD;  Location: [facility] C[facility] LAB;  Service: Cardiovascular;  Laterality: N/A;  . COLONOSCOPY  11/2009   Dr. Benson Norway  . ELECTROPHYSIOLOGIC STUDY N/A 07/04/2015   Procedure: A-Flutter Ablation;  Surgeon: Evans Lance, MD;  Location: [place] CV LAB;  Service: Cardiovascular;  Laterality: N/A;  . ROTATOR CUFF REPAIR       Current Outpatient Medications:  .  aspirin EC 81 MG tablet, Take 1 tablet (81 mg total) by mouth daily., Disp: 90 tablet, Rfl: 3 .  azelastine (ASTELIN) 0.1 % nasal spray, Place 1 spray into both nostrils 2 (two) times daily. Use in each nostril as directed, Disp: 90  mL, Rfl: 1 .  busPIRone (BUSPAR) 15 MG tablet, Take one tablet twice daily., Disp: 180 tablet, Rfl: 1 .  carvedilol (COREG) 12.5 MG tablet, Take 1 tablet (12.5 mg total) by mouth 2 (two) times daily with a meal., Disp: 60 tablet, Rfl: 3 .  gabapentin (NEURONTIN) 300 MG capsule, , Disp: , Rfl:  .  L-Methylfolate 15 MG TABS, Take 1 tablet (15 mg total) by mouth daily., Disp: 90 tablet, Rfl: 1 .  levocetirizine (XYZAL) 5 MG tablet, Take 1 tablet (5 mg total) by mouth every evening., Disp: 90 tablet, Rfl: 1 .  losartan-hydrochlorothiazide (HYZAAR) 100-12.5 MG tablet, TAKE ONE TABLET BY MOUTH DAILY, Disp: 90 tablet, Rfl: 0 .  lurasidone (LATUDA) 20 MG TABS tablet, Take 1 tablet (20 mg total) by mouth daily., Disp: 28 tablet, Rfl: 0 .  mupirocin ointment (BACTROBAN) 2 %, Apply 1 application topically 2 (two) times daily., Disp: 30 g, Rfl: 2 .  sildenafil (REVATIO) 20 MG tablet, Take 20-100 mg by mouth as needed (ED)., Disp: , Rfl:  .  traZODone (DESYREL) 50 MG tablet, Take 1 tablet (50 mg total) by mouth at bedtime., Disp: 90 tablet, Rfl: 1 .  vortioxetine HBr (TRINTELLIX) 20 MG TABS tablet, Take 1 tablet (20 mg total) by mouth daily., Disp: 90 tablet, Rfl:  1  No Known Allergies       Objective:  Physical Exam  General: AAO x3, NAD  Dermatological: Thick callused areas present to medial first MPJ.  No known ulceration.  Skin fissure present to the distal aspect of right hallux with superficial granular wound present within the fissure but there is no drainage or pus and there is no stray erythema or signs of infection.  No other open lesions.  Vascular: Dorsalis Pedis artery and Posterior Tibial artery pedal pulses are 2/4 bilateral with immedate capillary fill time. Pedal hair growth present. No varicosities and no lower extremity edema present bilateral. There is no pain with calf compression, swelling, warmth, erythema.   Neruologic: Grossly intact via light touch bilateral. Protective  threshold with Semmes Wienstein monofilament intact to all pedal sites bilateral.   Musculoskeletal: Posterior spurring present to both heels with  a larger "pump bump" on the left heel.  No edema or erythema.  No pain on the Achilles tendon Thompson test was negative.  Muscular strength 5/5 in all groups tested bilateral.  Gait: Unassisted, Nonantalgic.     Assessment:   68 year old male with thick hyperkeratotic lesions, skin fissures; posterior heel spur/haglund's deformity     Plan:  -Treatment options discussed including all alternatives, risks, and complications -Etiology of symptoms were discussed  1.  Skin fissures -Debrided hyperkeratotic lesions x 3 without any complications.  At the distal right hallux there is a open sore throat prescribe mupirocin ointment for this.  To the other areas recommended urea 40% which was dispensed today.   2.  Posterior heel spur/Haglund's deformity -Dispensed gel offloading pads.  Voltaren gel.  Dispensed heel lift.  Stretching, icing daily.  Return in about 6 weeks (around 10/29/2019).  Trula Slade DPM

## 2019-09-20 ENCOUNTER — Telehealth: Payer: Self-pay | Admitting: *Deleted

## 2019-09-20 NOTE — Telephone Encounter (Signed)
Ok to proceed with surgery. Low risk. Ok to stop ASA. GT

## 2019-09-20 NOTE — Telephone Encounter (Signed)
   [facility] Pre-operative Risk Assessment    Request for surgical clearance:  1. What type of surgery is being performed? LEFT TOTAL KNEE ARTHROPLASTY   2. When is this surgery scheduled? 10/30/19   3. What type of clearance is required (medical clearance vs. Pharmacy clearance to hold med vs. Both)? MEDICAL  4. Are there any medications that need to be held prior to surgery and how long? ASA    5. Practice name and name of physician performing surgery? [facility]; DR. [place]field   6. What is your office phone number [phone]    7.   What is your office fax number [phone]  8.   Anesthesia type (None, local, MAC, general) ? SPINAL   Julaine Hua 09/20/2019, 11:18 AM  _________________________________________________________________   (provider comments below)

## 2019-09-21 NOTE — Telephone Encounter (Signed)
   Primary Cardiologist: Cristopher Peru, MD  Chart reviewed as part of pre-operative protocol coverage. Given past medical history and time since last visit, based on ACC/AHA guidelines, Lee Lee would be at acceptable risk for the planned procedure without further cardiovascular testing.  Per Dr. [name], patient is ok to proceed with surgery as low risk and is acceptable to hold ASA therapy prior to surgery then resume when ok from a surgical standpoint.   I will route this recommendation to the requesting party via Epic fax function and remove from pre-op pool.  Please call with questions.  Kathyrn Drown, NP 09/21/2019, 8:29 AM

## 2019-10-01 DIAGNOSIS — D485 Neoplasm of uncertain behavior of skin: Secondary | ICD-10-CM | POA: Diagnosis not present

## 2019-10-01 DIAGNOSIS — C44319 Basal cell carcinoma of skin of other parts of face: Secondary | ICD-10-CM | POA: Diagnosis not present

## 2019-10-01 DIAGNOSIS — Z85828 Personal history of other malignant neoplasm of skin: Secondary | ICD-10-CM | POA: Diagnosis not present

## 2019-10-01 DIAGNOSIS — C44311 Basal cell carcinoma of skin of nose: Secondary | ICD-10-CM | POA: Diagnosis not present

## 2019-10-01 DIAGNOSIS — Z85820 Personal history of malignant melanoma of skin: Secondary | ICD-10-CM | POA: Diagnosis not present

## 2019-10-04 DIAGNOSIS — Z85828 Personal history of other malignant neoplasm of skin: Secondary | ICD-10-CM | POA: Diagnosis not present

## 2019-10-04 DIAGNOSIS — C4401 Basal cell carcinoma of skin of lip: Secondary | ICD-10-CM | POA: Diagnosis not present

## 2019-10-08 NOTE — H&P (Signed)
TOTAL KNEE ADMISSION H&P  Patient is being admitted for left total knee arthroplasty.  Subjective:  Chief Complaint:  Left knee primary OA / pain  HPI: Lee Lee, 68 y.o. male, has a history of pain and functional disability in the left knee due to arthritis and has failed non-surgical conservative treatments for greater than 12 weeks to include NSAID's and/or analgesics, corticosteriod injections and activity modification.  Onset of symptoms was gradual, starting ~1 years ago with gradually worsening course since that time. The patient noted prior procedures on the knee to include  arthroscopy and menisectomy on the left knee(s).  Patient currently rates pain in the left knee(s) at 8 out of 10 with activity. Patient has worsening of pain with activity and weight bearing, pain that interferes with activities of daily living, pain with passive range of motion, crepitus and joint swelling.  Patient has evidence of periarticular osteophytes and joint space narrowing by imaging studies. There is no active infection.  Risks, benefits and expectations were discussed with the patient.  Risks including but not limited to the risk of anesthesia, blood clots, nerve damage, blood vessel damage, failure of the prosthesis, infection and up to and including death.  Patient understand the risks, benefits and expectations and wishes to proceed with surgery.   PCP: Janith Lima, MD  D/C Plans:       Home   Post-op Meds:       No Rx given  Tranexamic Acid:      To be given - IV   Decadron:      Is to be given  FYI:      ASA  [place]  DME:   Pt already has equipment  PT:   [place]: [place].   Patient Active Problem List   Diagnosis Date Noted  . Acute right-sided low back pain without sciatica 01/10/2019  . Occult blood in stools 12/27/2018  . Thoracic aortic aneurysm without rupture ([place]) 12/27/2018  . Seasonal allergic rhinitis due to pollen 12/27/2018   . Benign prostatic hyperplasia without lower urinary tract symptoms 12/26/2018  . Unilateral primary osteoarthritis, left knee 03/08/2018  . Insomnia due to anxiety and fear 09/30/2016  . Restless leg syndrome, familial 09/30/2016  . Iron deficiency anemia 09/30/2016  . Hypertriglyceridemia 06/15/2016  . Hyperlipidemia with target LDL less than 130 06/15/2016  . Hyperglycemia 06/15/2016  . S/P radiofrequency ablation operation for arrhythmia 07/04/15 07/05/2015  . SVT (supraventricular tachycardia) ([place]) 07/04/2015  . Melanoma of skin ([place]) 04/16/2015  . Atrial flutter ([address]) 12/07/2012  . Essential hypertension 11/07/2012  . Ascending aortic aneurysm ([place]) 11/07/2012  . ED (erectile dysfunction) 11/15/2011   Past Medical History:  Diagnosis Date  . Alcohol abuse, episodic drinking behavior   . Anxiety   . Arthritis    back & knees  . Atrial flutter ([place])    s/p RFCA 01/05/13  . Calcium oxalate renal stones   . Complication of anesthesia    makes him loopy  . Depression   . ED (erectile dysfunction)   . Hemorrhoids   . Hypertension     Past Surgical History:  Procedure Laterality Date  . ABLATION OF DYSRHYTHMIC FOCUS  01/05/2013  . ARTHROSCOPIC REPAIR ACL    . ATRIAL FLUTTER ABLATION N/A 01/05/2013   Procedure: ATRIAL FLUTTER ABLATION;  Surgeon: Evans Lance, MD;  Location: [facility] CATH LAB;  Service: Cardiovascular;  Laterality: N/A;  . COLONOSCOPY  11/2009  Dr. Benson Norway  . ELECTROPHYSIOLOGIC STUDY N/A 07/04/2015   Procedure: A-Flutter Ablation;  Surgeon: Evans Lance, MD;  Location: [place] CV LAB;  Service: Cardiovascular;  Laterality: N/A;  . ROTATOR CUFF REPAIR      No current facility-administered medications for this encounter.    Current Outpatient Medications  Medication Sig Dispense Refill Last Dose  . aspirin EC 81 MG tablet Take 1 tablet (81 mg total) by mouth daily. 90 tablet 3 Taking  . azelastine (ASTELIN) 0.1 % nasal spray Place 1 spray into both nostrils 2  (two) times daily. Use in each nostril as directed 90 mL 1 Taking  . busPIRone (BUSPAR) 15 MG tablet Take one tablet twice daily. 180 tablet 1   . carvedilol (COREG) 12.5 MG tablet Take 1 tablet (12.5 mg total) by mouth 2 (two) times daily with a meal. 60 tablet 3   . gabapentin (NEURONTIN) 300 MG capsule    Taking  . L-Methylfolate 15 MG TABS Take 1 tablet (15 mg total) by mouth daily. 90 tablet 1 Taking  . levocetirizine (XYZAL) 5 MG tablet Take 1 tablet (5 mg total) by mouth every evening. 90 tablet 1 Taking  . losartan-hydrochlorothiazide (HYZAAR) 100-12.5 MG tablet TAKE ONE TABLET BY MOUTH DAILY 90 tablet 0   . lurasidone (LATUDA) 20 MG TABS tablet Take 1 tablet (20 mg total) by mouth daily. 28 tablet 0   . mupirocin ointment (BACTROBAN) 2 % Apply 1 application topically 2 (two) times daily. 30 g 2   . sildenafil (REVATIO) 20 MG tablet Take 20-100 mg by mouth as needed (ED).   Taking  . traZODone (DESYREL) 50 MG tablet Take 1 tablet (50 mg total) by mouth at bedtime. 90 tablet 1 Taking  . vortioxetine HBr (TRINTELLIX) 20 MG TABS tablet Take 1 tablet (20 mg total) by mouth daily. 90 tablet 1 Taking   No Known Allergies  Social History   Tobacco Use  . Smoking status: Former Smoker    Quit date: 01/06/1988    Years since quitting: 31.7  . Smokeless tobacco: Current User    Types: Chew  . Tobacco comment: chews tobacco when playing golf only  Substance Use Topics  . Alcohol use: Not Currently    Alcohol/week: 5.0 standard drinks    Types: 5 Glasses of wine per week    Comment: wine daily    Family History  Problem Relation Age of Onset  . Valvular heart disease Mother   . Lymphoma Mother      Review of Systems  Constitutional: Negative.   HENT: Negative.   Eyes: Negative.   Respiratory: Negative.   Cardiovascular: Negative.   Gastrointestinal: Negative.   Genitourinary: Negative.   Musculoskeletal: Positive for joint pain.  Skin: Negative.   Neurological: Negative.    Endo/Heme/Allergies: Positive for environmental allergies.  Psychiatric/Behavioral: Negative.     Objective:  Physical Exam  Constitutional: He is oriented to person, place, and time. He appears well-developed.  HENT:  Head: Normocephalic.  Eyes: Pupils are equal, round, and reactive to light.  Neck: Neck supple. No JVD present. No tracheal deviation present. No thyromegaly present.  Cardiovascular: Normal rate, regular rhythm and intact distal pulses.  Respiratory: Effort normal and breath sounds normal. No respiratory distress. He has no wheezes.  GI: Soft. There is no abdominal tenderness. There is no guarding.  Musculoskeletal:     Left knee: He exhibits swelling and bony tenderness. He exhibits no ecchymosis, no deformity, no laceration and no erythema.  Tenderness found.  Lymphadenopathy:    He has no cervical adenopathy.  Neurological: He is alert and oriented to person, place, and time.  Skin: Skin is warm and dry.  Psychiatric: He has a normal mood and affect.     Labs:  Estimated body mass index is 31.8 kg/m as calculated from the following:   Height as of 08/10/19: 5' 11"  (1.803 m).   Weight as of 08/10/19: 103.4 kg.   Imaging Review Plain radiographs demonstrate severe degenerative joint disease of the left knee(s).  The bone quality appears to be good for age and reported activity level.      Assessment/Plan:  End stage arthritis, left knee   The patient history, physical examination, clinical judgment of the provider and imaging studies are consistent with end stage degenerative joint disease of the left knee(s) and total knee arthroplasty is deemed medically necessary. The treatment options including medical management, injection therapy arthroscopy and arthroplasty were discussed at length. The risks and benefits of total knee arthroplasty were presented and reviewed. The risks due to aseptic loosening, infection, stiffness, patella tracking problems,  thromboembolic complications and other imponderables were discussed. The patient acknowledged the explanation, agreed to proceed with the plan and consent was signed. Patient is being admitted for inpatient treatment for surgery, pain control, PT, OT, prophylactic antibiotics, VTE prophylaxis, progressive ambulation and ADL's and discharge planning. The patient is planning to be discharged home.     Patient's anticipated LOS is less than 2 midnights, meeting these requirements: - Lives within 1 hour of care - Has a competent adult at home to recover with post-op recover - NO history of  - Chronic pain requiring opiods  - Diabetes  - Coronary Artery Disease  - Heart failure  - Heart attack  - Stroke  - DVT/VTE  - Cardiac arrhythmia  - Respiratory Failure/COPD  - Renal failure  - Anemia  - Advanced Liver disease     [place] Pugh. Jamerson Vonbargen   PA-C  10/08/2019, 10:05 AM

## 2019-10-11 ENCOUNTER — Other Ambulatory Visit: Payer: Self-pay

## 2019-10-11 ENCOUNTER — Telehealth: Payer: Self-pay | Admitting: Adult Health

## 2019-10-11 DIAGNOSIS — F3181 Bipolar II disorder: Secondary | ICD-10-CM

## 2019-10-11 MED ORDER — LATUDA 20 MG PO TABS: 20.0000 mg | ORAL_TABLET | Freq: Every day | ORAL | 1 refills | Status: DC

## 2019-10-11 NOTE — Telephone Encounter (Signed)
Latuda 20 mg submitted to [id number] with 1 refill, follow up is in Dec.

## 2019-10-11 NOTE — Telephone Encounter (Signed)
Pt stated he was given some samples of Latuda. He is pleased with the results and would like a prescription sent to [facility] on [address].

## 2019-10-16 DIAGNOSIS — D225 Melanocytic nevi of trunk: Secondary | ICD-10-CM | POA: Diagnosis not present

## 2019-10-16 DIAGNOSIS — Z85828 Personal history of other malignant neoplasm of skin: Secondary | ICD-10-CM | POA: Diagnosis not present

## 2019-10-16 DIAGNOSIS — Z85820 Personal history of malignant melanoma of skin: Secondary | ICD-10-CM | POA: Diagnosis not present

## 2019-10-16 DIAGNOSIS — D485 Neoplasm of uncertain behavior of skin: Secondary | ICD-10-CM | POA: Diagnosis not present

## 2019-10-16 DIAGNOSIS — C44519 Basal cell carcinoma of skin of other part of trunk: Secondary | ICD-10-CM | POA: Diagnosis not present

## 2019-10-16 DIAGNOSIS — L821 Other seborrheic keratosis: Secondary | ICD-10-CM | POA: Diagnosis not present

## 2019-10-16 DIAGNOSIS — L814 Other melanin hyperpigmentation: Secondary | ICD-10-CM | POA: Diagnosis not present

## 2019-10-22 NOTE — Patient Instructions (Addendum)
DUE TO COVID-19 ONLY ONE VISITOR IS ALLOWED TO COME WITH YOU AND STAY IN THE WAITING ROOM ONLY DURING PRE OP AND PROCEDURE. THE ONE VISITOR MAY VISIT WITH YOU IN YOUR PRIVATE ROOM DURING VISITING HOURS ONLY!!   COVID SWAB TESTING MUST BE COMPLETED ON:     10-26-19 @8 :88 AM [address], [place] -[facility] enter pre surgical testing line (Must self quarantine after testing. Follow instructions on handout.)         Your procedure is scheduled on: 10/30/2019   Report to [facility] Main  Entrance   Report to short stay at  5:30 AM   Call this number if you have problems the morning of surgery [phone]   NO SOLID FOOD AFTER MIDNIGHT THE NIGHT PRIOR TO SURGERY. NOTHING BY MOUTH EXCEPT CLEAR LIQUIDS UNTIL 4:15 AM . PLEASE FINISH ENSURE DRINK PER SURGEON ORDER  WHICH NEEDS TO BE COMPLETED AT 4:15 AM.      CLEAR LIQUID DIET   Foods Allowed                                                                     Foods Excluded  Coffee and tea, regular and decaf                             liquids that you cannot  Plain Jell-O any favor except red or purple                                           see through such as: Fruit ices (not with fruit pulp)                                     milk, soups, orange juice  Iced Popsicles                                    All solid food Carbonated beverages, regular and diet                                    Cranberry, grape and apple juices Sports drinks like Gatorade Lightly seasoned clear broth or consume(fat free) Sugar, honey syrup  Sample Menu Breakfast                                Lunch                                     Supper Cranberry juice                    Beef broth  Chicken broth Jell-O                                     Grape juice                           Apple juice Coffee or tea                        Jell-O                                      Popsicle                                                 Coffee or tea                        Coffee or tea  _____________________________________________________________________      Brush your teeth the morning of surgery.      Take these medicines the morning of surgery with A SIP OF WATER: Carvedilol ,Neurontin, Buspar,Vortioxetine                                 You may not have any metal on your body including hair pins, jewelry, and body piercings              Do not wear lotions, powders, perfumes/cologne, or deodorant                       Men may shave face and neck.   Do not bring valuables to the hospital. [place].   Contacts, dentures or bridgework may not be worn into surgery.   Bring small overnight bag day of surgery.    Special Instructions: Bring a copy of your healthcare power of attorney and living will documents  the day of surgery if you haven't scanned them in before.              Please read over the following fact sheets you were given:  _______________________________________________________________________  [facility] - Preparing for Surgery Before surgery, you can play an important role.  Because skin is not sterile, your skin needs to be as free of germs as possible.  You can reduce the number of germs on your skin by washing with CHG (chlorahexidine gluconate) soap before surgery.  CHG is an antiseptic cleaner which kills germs and bonds with the skin to continue killing germs even after washing. Please DO NOT use if you have an allergy to CHG or antibacterial soaps.  If your skin becomes reddened/irritated stop using the CHG and inform your nurse when you arrive at Short Stay. Do not shave (including legs and underarms) for at least 48 hours prior to the first CHG shower.  You may shave your face/neck. Please follow these instructions carefully:  1.  Shower with CHG Soap the night before surgery and the  morning of  Surgery.  2.  If you choose to wash your hair, wash your hair first as usual with your  normal  shampoo.  3.  After you shampoo, rinse your hair and body thoroughly to remove the  shampoo.                           4.  Use CHG as you would any other liquid soap.  You can apply chg directly  to the skin and wash                       Gently with a scrungie or clean washcloth.  5.  Apply the CHG Soap to your body ONLY FROM THE NECK DOWN.   Do not use on face/ open                           Wound or open sores. Avoid contact with eyes, ears mouth and genitals (private parts).                       Wash face,  Genitals (private parts) with your normal soap.             6.  Wash thoroughly, paying special attention to the area where your surgery  will be performed.  7.  Thoroughly rinse your body with warm water from the neck down.  8.  DO NOT shower/wash with your normal soap after using and rinsing off  the CHG Soap.                9.  Pat yourself dry with a clean towel.            10.  Wear clean pajamas.            11.  Place clean sheets on your bed the night of your first shower and do not  sleep with pets. Day of Surgery : Do not apply any lotions/deodorants the morning of surgery.  Please wear clean clothes to the hospital/surgery center.  FAILURE TO FOLLOW THESE INSTRUCTIONS MAY RESULT IN THE CANCELLATION OF YOUR SURGERY PATIENT SIGNATURE_________________________________  NURSE SIGNATURE__________________________________   Incentive Spirometer  An incentive spirometer is a tool that can help keep your lungs clear and active. This tool measures how well you are filling your lungs with each breath. Taking long deep breaths may help reverse or decrease the chance of developing breathing (pulmonary) problems (especially infection) following:  A long period of time when you are unable to move or be active. BEFORE THE PROCEDURE   If the spirometer includes an indicator to show your best  effort, your nurse or respiratory therapist will set it to a desired goal.  If possible, sit up straight or lean slightly forward. Try not to slouch.  Hold the incentive spirometer in an upright position. INSTRUCTIONS FOR USE  1. Sit on the edge of your bed if possible, or sit up as far as you can in bed or on a chair. 2. Hold the incentive spirometer in an upright position. 3. Breathe out normally. 4. Place the mouthpiece in your mouth and seal your lips tightly around it. 5. Breathe in slowly and as deeply as possible, raising the piston or the ball toward the top of the column. 6. Hold your breath for 3-5 seconds or for as long as possible. Allow the piston or  ball to fall to the bottom of the column. 7. Remove the mouthpiece from your mouth and breathe out normally. 8. Rest for a few seconds and repeat Steps 1 through 7 at least 10 times every 1-2 hours when you are awake. Take your time and take a few normal breaths between deep breaths. 9. The spirometer may include an indicator to show your best effort. Use the indicator as a goal to work toward during each repetition. 10. After each set of 10 deep breaths, practice coughing to be sure your lungs are clear. If you have an incision (the cut made at the time of surgery), support your incision when coughing by placing a pillow or rolled up towels firmly against it. Once you are able to get out of bed, walk around indoors and cough well. You may stop using the incentive spirometer when instructed by your caregiver.  RISKS AND COMPLICATIONS  Take your time so you do not get dizzy or light-headed.  If you are in pain, you may need to take or ask for pain medication before doing incentive spirometry. It is harder to take a deep breath if you are having pain. AFTER USE  Rest and breathe slowly and easily.  It can be helpful to keep track of a log of your progress. Your caregiver can provide you with a simple table to help with this. If you  are using the spirometer at home, follow these instructions: [place] IF:   You are having difficultly using the spirometer.  You have trouble using the spirometer as often as instructed.  Your pain medication is not giving enough relief while using the spirometer.  You develop fever of 100.5 F (38.1 C) or higher. SEEK IMMEDIATE MEDICAL CARE IF:   You cough up bloody sputum that had not been present before.  You develop fever of 102 F (38.9 C) or greater.  You develop worsening pain at or near the incision site. MAKE SURE YOU:   Understand these instructions.  Will watch your condition.  Will get help right away if you are not doing well or get worse. Document Released: 03/28/2007 Document Revised: 02/07/2012 Document Reviewed: 05/29/2007 [facility] Patient Information 2014 [place], [place].   WHAT IS A BLOOD TRANSFUSION? Blood Transfusion Information  A transfusion is the replacement of blood or some of its parts. Blood is made up of multiple cells which provide different functions.  Red blood cells carry oxygen and are used for blood loss replacement.  White blood cells fight against infection.  Platelets control bleeding.  Plasma helps clot blood.  Other blood products are available for specialized needs, such as hemophilia or other clotting disorders. BEFORE THE TRANSFUSION  Who gives blood for transfusions?   Healthy volunteers who are fully evaluated to make sure their blood is safe. This is blood bank blood. Transfusion therapy is the safest it has ever been in the practice of medicine. Before blood is taken from a donor, a complete history is taken to make sure that person has no history of diseases nor engages in risky social behavior (examples are intravenous drug use or sexual activity with multiple partners). The donor's travel history is screened to minimize risk of transmitting infections, such as malaria. The donated blood is tested for signs of  infectious diseases, such as HIV and hepatitis. The blood is then tested to be sure it is compatible with you in order to minimize the chance of a transfusion reaction. If you or a relative  donates blood, this is often done in anticipation of surgery and is not appropriate for emergency situations. It takes many days to process the donated blood. RISKS AND COMPLICATIONS Although transfusion therapy is very safe and saves many lives, the main dangers of transfusion include:   Getting an infectious disease.  Developing a transfusion reaction. This is an allergic reaction to something in the blood you were given. Every precaution is taken to prevent this. The decision to have a blood transfusion has been considered carefully by your caregiver before blood is given. Blood is not given unless the benefits outweigh the risks. AFTER THE TRANSFUSION  Right after receiving a blood transfusion, you will usually feel much better and more energetic. This is especially true if your red blood cells have gotten low (anemic). The transfusion raises the level of the red blood cells which carry oxygen, and this usually causes an energy increase.  The nurse administering the transfusion will monitor you carefully for complications. HOME CARE INSTRUCTIONS  No special instructions are needed after a transfusion. You may find your energy is better. Speak with your caregiver about any limitations on activity for underlying diseases you may have. SEEK MEDICAL CARE IF:   Your condition is not improving after your transfusion.  You develop redness or irritation at the intravenous (IV) site. SEEK IMMEDIATE MEDICAL CARE IF:  Any of the following symptoms occur over the next 12 hours:  Shaking chills.  You have a temperature by mouth above 102 F (38.9 C), not controlled by medicine.  Chest, back, or muscle pain.  People around you feel you are not acting correctly or are confused.  Shortness of breath or  difficulty breathing.  Dizziness and fainting.  You get a rash or develop hives.  You have a decrease in urine output.  Your urine turns a dark color or changes to pink, red, or brown. Any of the following symptoms occur over the next 10 days:  You have a temperature by mouth above 102 F (38.9 C), not controlled by medicine.  Shortness of breath.  Weakness after normal activity.  The white part of the eye turns yellow (jaundice).  You have a decrease in the amount of urine or are urinating less often.  Your urine turns a dark color or changes to pink, red, or brown. Document Released: 11/12/2000 Document Revised: 02/07/2012 Document Reviewed: 07/01/2008 [facility] Patient Information 2014 [place].

## 2019-10-23 ENCOUNTER — Encounter (HOSPITAL_COMMUNITY): Payer: Self-pay

## 2019-10-23 ENCOUNTER — Encounter (HOSPITAL_COMMUNITY)
Admission: RE | Admit: 2019-10-23 | Discharge: 2019-10-23 | Disposition: A | Discharge status: Home / Self Care | Payer: PPO | Source: Ambulatory Visit | Attending: Orthopedic Surgery | Admitting: Orthopedic Surgery

## 2019-10-23 ENCOUNTER — Other Ambulatory Visit: Payer: Self-pay

## 2019-10-23 DIAGNOSIS — M1712 Unilateral primary osteoarthritis, left knee: Secondary | ICD-10-CM | POA: Insufficient documentation

## 2019-10-23 DIAGNOSIS — M47819 Spondylosis without myelopathy or radiculopathy, site unspecified: Secondary | ICD-10-CM | POA: Diagnosis not present

## 2019-10-23 DIAGNOSIS — Z01812 Encounter for preprocedural laboratory examination: Secondary | ICD-10-CM | POA: Insufficient documentation

## 2019-10-23 DIAGNOSIS — I10 Essential (primary) hypertension: Secondary | ICD-10-CM | POA: Diagnosis not present

## 2019-10-23 DIAGNOSIS — F1021 Alcohol dependence, in remission: Secondary | ICD-10-CM | POA: Diagnosis not present

## 2019-10-23 DIAGNOSIS — F329 Major depressive disorder, single episode, unspecified: Secondary | ICD-10-CM | POA: Insufficient documentation

## 2019-10-23 DIAGNOSIS — I4892 Unspecified atrial flutter: Secondary | ICD-10-CM | POA: Insufficient documentation

## 2019-10-23 DIAGNOSIS — Z791 Long term (current) use of non-steroidal anti-inflammatories (NSAID): Secondary | ICD-10-CM | POA: Insufficient documentation

## 2019-10-23 DIAGNOSIS — Z79899 Other long term (current) drug therapy: Secondary | ICD-10-CM | POA: Diagnosis not present

## 2019-10-23 DIAGNOSIS — Z7982 Long term (current) use of aspirin: Secondary | ICD-10-CM | POA: Insufficient documentation

## 2019-10-23 DIAGNOSIS — F419 Anxiety disorder, unspecified: Secondary | ICD-10-CM | POA: Diagnosis not present

## 2019-10-23 DIAGNOSIS — Z87891 Personal history of nicotine dependence: Secondary | ICD-10-CM | POA: Insufficient documentation

## 2019-10-23 LAB — ABO/RH: ABO/RH(D): A NEG

## 2019-10-23 LAB — CBC
HCT: 42.1 % (ref 39.0–52.0)
Hemoglobin: 14 g/dL (ref 13.0–17.0)
MCH: 36.1 pg — ABNORMAL HIGH (ref 26.0–34.0)
MCHC: 33.3 g/dL (ref 30.0–36.0)
MCV: 108.5 fL — ABNORMAL HIGH (ref 80.0–100.0)
Platelets: 132 10*3/uL — ABNORMAL LOW (ref 150–400)
RBC: 3.88 MIL/uL — ABNORMAL LOW (ref 4.22–5.81)
RDW: 12.7 % (ref 11.5–15.5)
WBC: 5.2 10*3/uL (ref 4.0–10.5)
nRBC: 0 % (ref 0.0–0.2)

## 2019-10-23 LAB — BASIC METABOLIC PANEL
Anion gap: 10 (ref 5–15)
BUN: 16 mg/dL (ref 8–23)
CO2: 25 mmol/L (ref 22–32)
Calcium: 9.1 mg/dL (ref 8.9–10.3)
Chloride: 98 mmol/L (ref 98–111)
Creatinine, Ser: 0.81 mg/dL (ref 0.61–1.24)
GFR calc Af Amer: >60 mL/min (ref >60)
GFR calc non Af Amer: >60 mL/min (ref >60)
Glucose, Bld: 114 mg/dL — ABNORMAL HIGH (ref 70–99)
Potassium: 3.5 mmol/L (ref 3.5–5.1)
Sodium: 133 mmol/L — ABNORMAL LOW (ref 135–145)

## 2019-10-23 LAB — SURGICAL PCR SCREEN
MRSA, PCR: NEGATIVE
Staphylococcus aureus: NEGATIVE

## 2019-10-24 NOTE — Progress Notes (Addendum)
Anesthesia Chart Review   Case: [id number] Date/Time: 10/30/19 0700   Procedure: TOTAL KNEE ARTHROPLASTY (Left Knee) - 70 mins   Anesthesia type: Spinal   Pre-op diagnosis: Left knee osteoarthritis   Location: [place] ROOM 09 / [facility]S   Surgeon: Paralee Cancel, MD      DISCUSSION:68 y.o. former smoker (quit 01/06/88) with h/o HTN, atrial flutter s/p RFCA 12/2012, left knee OA scheduled for above procedure 10/30/2019 with Dr. Paralee Cancel.   Stable thoracic aortic aneurysm on recent CT angio measuring 4.2 cm.  Annual follow up recommended.   Cleared by cardiology 09/21/2019.  Per Kathyrn Drown, "Chart reviewed as part of pre-operative protocol coverage. Given past medical history and time since last visit, based on ACC/AHA guidelines, WIL SLAPE would be at acceptable risk for the planned procedure without further cardiovascular testing.  Per Dr. [name], patient is ok to proceed with surgery as low risk and is acceptable to hold ASA therapy prior to surgery then resume when ok from a surgical standpoint."  Anticipate pt can proceed with planned procedure barring acute status change.   VS: BP (!) 152/88 (BP Location: Left Arm)   Pulse 60   Temp 36.8 C (Oral)   Resp 18   Ht 5' 11"  (1.803 m)   Wt 104 kg   SpO2 100%   BMI 31.97 kg/m   PROVIDERS: Janith Lima, MD is PCP   Cristopher Peru, MD is Cardiologist  LABS: Labs reviewed: Acceptable for surgery. (all labs ordered are listed, but only abnormal results are displayed)  Labs Reviewed  BASIC METABOLIC PANEL - Abnormal; Notable for the following components:      Result Value   Sodium 133 (*)    Glucose, Bld 114 (*)    All other components within normal limits  CBC - Abnormal; Notable for the following components:   RBC 3.88 (*)    MCV 108.5 (*)    MCH 36.1 (*)    Platelets 132 (*)    All other components within normal limits  SURGICAL PCR SCREEN  TYPE AND SCREEN  ABO/RH     IMAGES:   EKG: 07/12/2019 Rate 61  bpm Normal sinus rhythm   CV: ETT 07/19/2019  Blood pressure demonstrated a normal response to exercise.  There was no ST segment deviation noted during stress.  No T wave inversion was noted during stress.  Negative adequate stress test.    Echo 07/17/2019 IMPRESSIONS   1. The left ventricle has normal systolic function with an ejection fraction of 60-65%. The cavity size was normal. Left ventricular diastolic Doppler parameters are consistent with pseudonormalization.  2. The right ventricle has normal systolic function. The cavity was normal. There is no increase in right ventricular wall thickness.  3. Left atrial size was mildly dilated.  4. There is mild mitral annular calcification present.  5. There is mild dilatation of the ascending aorta measuring 44 mm. Past Medical History:  Diagnosis Date  . Alcohol abuse, episodic drinking behavior   . Anxiety   . Arthritis    back & knees  . Atrial flutter ([place])    s/p RFCA 01/05/13  . Calcium oxalate renal stones   . Complication of anesthesia    makes him loopy  . Depression   . ED (erectile dysfunction)   . Hemorrhoids   . Hypertension     Past Surgical History:  Procedure Laterality Date  . ABLATION OF DYSRHYTHMIC FOCUS  01/05/2013  . ARTHROSCOPIC REPAIR ACL    .  ATRIAL FLUTTER ABLATION N/A 01/05/2013   Procedure: ATRIAL FLUTTER ABLATION;  Surgeon: Evans Lance, MD;  Location: [facility] CATH LAB;  Service: Cardiovascular;  Laterality: N/A;  . COLONOSCOPY  11/2009   Dr. Benson Norway  . ELECTROPHYSIOLOGIC STUDY N/A 07/04/2015   Procedure: A-Flutter Ablation;  Surgeon: Evans Lance, MD;  Location: [place] CV LAB;  Service: Cardiovascular;  Laterality: N/A;  . ROTATOR CUFF REPAIR      MEDICATIONS: . aspirin EC 81 MG tablet  . azelastine (ASTELIN) 0.1 % nasal spray  . busPIRone (BUSPAR) 15 MG tablet  . carvedilol (COREG) 12.5 MG tablet  . gabapentin (NEURONTIN) 300 MG capsule  . L-Methylfolate 15 MG TABS  . levocetirizine  (XYZAL) 5 MG tablet  . losartan-hydrochlorothiazide (HYZAAR) 100-12.5 MG tablet  . lurasidone (LATUDA) 20 MG TABS tablet  . mupirocin ointment (BACTROBAN) 2 %  . naproxen sodium (ALEVE) 220 MG tablet  . traZODone (DESYREL) 50 MG tablet  . vortioxetine HBr (TRINTELLIX) 20 MG TABS tablet   No current facility-administered medications for this encounter.     Maia Plan [facility] Pre-Surgical Testing [phone] 10/24/19  11:15 AM

## 2019-10-24 NOTE — Anesthesia Preprocedure Evaluation (Addendum)
Anesthesia Evaluation  Patient identified by MRN, date of birth, ID band Patient awake    Reviewed: Allergy & Precautions, NPO status , Patient's Chart, lab work & pertinent test results  History of Anesthesia Complications Negative for: history of anesthetic complications  Airway Mallampati: I  TM Distance: >3 FB Neck ROM: Full    Dental  (+) Dental Advisory Given   Pulmonary former smoker (quit 1989),  10/26/2019 SARS CoV2 NEG   breath sounds clear to auscultation       Cardiovascular hypertension, Pt. on medications (-) angina+ dysrhythmias (s/p ablation) Atrial Fibrillation  Rhythm:Regular Rate:Normal  07/17/2019 ECHO: EF 60-65%, valves OK, asc Ao 58m 07/19/2019 Exercise stress test: normal   Neuro/Psych Anxiety Depression negative neurological ROS     GI/Hepatic negative GI ROS, (+)     substance abuse  alcohol use,   Endo/Other  obesity  Renal/GU negative Renal ROSH/o stones     Musculoskeletal  (+) Arthritis , Osteoarthritis,    Abdominal (+) + obese,   Peds  Hematology negative hematology ROS (+)   Anesthesia Other Findings   Reproductive/Obstetrics                          Anesthesia Physical Anesthesia Plan  ASA: III  Anesthesia Plan: Spinal   Post-op Pain Management:  [facility] for Post-op pain   Induction:   PONV Risk Score and Plan: 1 and Ondansetron and Dexamethasone  Airway Management Planned: Simple Face Mask and Natural Airway  Additional Equipment: None  Intra-op Plan:   Post-operative Plan:   Informed Consent: I have reviewed the patients History and Physical, chart, labs and discussed the procedure including the risks, benefits and alternatives for the proposed anesthesia with the patient or authorized representative who has indicated his/her understanding and acceptance.     Dental advisory given  Plan Discussed with: CRNA and Surgeon  Anesthesia  Plan Comments: (See PAT note 10/23/2019, JKonrad Felix PA-C Plan routine monitors, SAB with adductor canal block for post op analgesia)     Anesthesia Quick Evaluation

## 2019-10-26 ENCOUNTER — Other Ambulatory Visit (HOSPITAL_COMMUNITY)
Admission: RE | Admit: 2019-10-26 | Discharge: 2019-10-26 | Disposition: A | Discharge status: Home / Self Care | Payer: PPO | Source: Ambulatory Visit | Attending: Orthopedic Surgery | Admitting: Orthopedic Surgery

## 2019-10-26 DIAGNOSIS — Z01812 Encounter for preprocedural laboratory examination: Secondary | ICD-10-CM | POA: Insufficient documentation

## 2019-10-26 DIAGNOSIS — Z20828 Contact with and (suspected) exposure to other viral communicable diseases: Secondary | ICD-10-CM | POA: Diagnosis not present

## 2019-10-26 LAB — SARS CORONAVIRUS 2 (TAT 6-24 HRS): SARS Coronavirus 2: NEGATIVE

## 2019-10-29 ENCOUNTER — Other Ambulatory Visit: Payer: Self-pay

## 2019-10-29 ENCOUNTER — Ambulatory Visit (INDEPENDENT_AMBULATORY_CARE_PROVIDER_SITE_OTHER): Payer: PPO | Admitting: Podiatry

## 2019-10-29 ENCOUNTER — Encounter (HOSPITAL_COMMUNITY): Payer: Self-pay | Admitting: Anesthesiology

## 2019-10-29 DIAGNOSIS — M7730 Calcaneal spur, unspecified foot: Secondary | ICD-10-CM | POA: Diagnosis not present

## 2019-10-29 DIAGNOSIS — M9262 Juvenile osteochondrosis of tarsus, left ankle: Secondary | ICD-10-CM | POA: Diagnosis not present

## 2019-10-29 DIAGNOSIS — R234 Changes in skin texture: Secondary | ICD-10-CM

## 2019-10-29 DIAGNOSIS — M9261 Juvenile osteochondrosis of tarsus, right ankle: Secondary | ICD-10-CM | POA: Diagnosis not present

## 2019-10-29 DIAGNOSIS — L84 Corns and callosities: Secondary | ICD-10-CM | POA: Diagnosis not present

## 2019-10-29 NOTE — Patient Instructions (Signed)
Continue to use the urea cream daily on the callus

## 2019-10-30 ENCOUNTER — Encounter (HOSPITAL_COMMUNITY): Payer: Self-pay | Admitting: *Deleted

## 2019-10-30 ENCOUNTER — Encounter (HOSPITAL_COMMUNITY)
Admission: RE | Disposition: A | Discharge status: Home / Self Care | Payer: Self-pay | Source: Home / Self Care | Attending: Orthopedic Surgery

## 2019-10-30 ENCOUNTER — Observation Stay (HOSPITAL_COMMUNITY)
Admission: RE | Admit: 2019-10-30 | Discharge: 2019-10-31 | Disposition: A | Discharge status: Home / Self Care | Payer: PPO | Attending: Orthopedic Surgery | Admitting: Orthopedic Surgery

## 2019-10-30 ENCOUNTER — Ambulatory Visit (HOSPITAL_COMMUNITY): Payer: PPO | Admitting: Anesthesiology

## 2019-10-30 ENCOUNTER — Ambulatory Visit (HOSPITAL_COMMUNITY): Payer: PPO | Admitting: Physician Assistant

## 2019-10-30 DIAGNOSIS — Z6831 Body mass index (BMI) 31.0-31.9, adult: Secondary | ICD-10-CM | POA: Diagnosis not present

## 2019-10-30 DIAGNOSIS — Z20828 Contact with and (suspected) exposure to other viral communicable diseases: Secondary | ICD-10-CM | POA: Diagnosis not present

## 2019-10-30 DIAGNOSIS — F419 Anxiety disorder, unspecified: Secondary | ICD-10-CM | POA: Diagnosis not present

## 2019-10-30 DIAGNOSIS — Z96652 Presence of left artificial knee joint: Secondary | ICD-10-CM

## 2019-10-30 DIAGNOSIS — Z87891 Personal history of nicotine dependence: Secondary | ICD-10-CM | POA: Insufficient documentation

## 2019-10-30 DIAGNOSIS — I10 Essential (primary) hypertension: Secondary | ICD-10-CM | POA: Insufficient documentation

## 2019-10-30 DIAGNOSIS — M25762 Osteophyte, left knee: Secondary | ICD-10-CM | POA: Insufficient documentation

## 2019-10-30 DIAGNOSIS — M479 Spondylosis, unspecified: Secondary | ICD-10-CM | POA: Insufficient documentation

## 2019-10-30 DIAGNOSIS — G8918 Other acute postprocedural pain: Secondary | ICD-10-CM | POA: Diagnosis not present

## 2019-10-30 DIAGNOSIS — F329 Major depressive disorder, single episode, unspecified: Secondary | ICD-10-CM | POA: Insufficient documentation

## 2019-10-30 DIAGNOSIS — M1712 Unilateral primary osteoarthritis, left knee: Secondary | ICD-10-CM | POA: Diagnosis not present

## 2019-10-30 DIAGNOSIS — Z7982 Long term (current) use of aspirin: Secondary | ICD-10-CM | POA: Diagnosis not present

## 2019-10-30 DIAGNOSIS — I712 Thoracic aortic aneurysm, without rupture: Secondary | ICD-10-CM | POA: Diagnosis not present

## 2019-10-30 DIAGNOSIS — E669 Obesity, unspecified: Secondary | ICD-10-CM | POA: Diagnosis present

## 2019-10-30 DIAGNOSIS — Z79899 Other long term (current) drug therapy: Secondary | ICD-10-CM | POA: Diagnosis not present

## 2019-10-30 DIAGNOSIS — M25462 Effusion, left knee: Secondary | ICD-10-CM | POA: Diagnosis not present

## 2019-10-30 HISTORY — PX: TOTAL KNEE ARTHROPLASTY: SHX125

## 2019-10-30 LAB — TYPE AND SCREEN
ABO/RH(D): A NEG
Antibody Screen: NEGATIVE

## 2019-10-30 SURGERY — ARTHROPLASTY, KNEE, TOTAL
Anesthesia: Spinal | Site: Knee | Laterality: Left

## 2019-10-30 MED ORDER — KETOROLAC TROMETHAMINE 30 MG/ML IJ SOLN
INTRAMUSCULAR | Status: AC
  Filled 2019-10-30: qty 1

## 2019-10-30 MED ORDER — BUPIVACAINE-EPINEPHRINE (PF) 0.25% -1:200000 IJ SOLN
INTRAMUSCULAR | Status: DC | PRN
  Administered 2019-10-30: 30 mL via PERINEURAL

## 2019-10-30 MED ORDER — ASPIRIN 81 MG PO CHEW
81.0000 mg | CHEWABLE_TABLET | Freq: Two times a day (BID) | ORAL | Status: DC
  Administered 2019-10-30 – 2019-10-31 (×2): 81 mg via ORAL
  Filled 2019-10-30 (×2): qty 1

## 2019-10-30 MED ORDER — MIDAZOLAM HCL 5 MG/5ML IJ SOLN
INTRAMUSCULAR | Status: DC | PRN
  Administered 2019-10-30 (×2): 1 mg via INTRAVENOUS
  Administered 2019-10-30: 2 mg via INTRAVENOUS

## 2019-10-30 MED ORDER — PROPOFOL 10 MG/ML IV BOLUS
INTRAVENOUS | Status: AC
  Filled 2019-10-30: qty 20

## 2019-10-30 MED ORDER — SODIUM CHLORIDE (PF) 0.9 % IJ SOLN
INTRAMUSCULAR | Status: AC
  Filled 2019-10-30: qty 50

## 2019-10-30 MED ORDER — LOSARTAN POTASSIUM 50 MG PO TABS
100.0000 mg | ORAL_TABLET | Freq: Every day | ORAL | Status: DC
  Administered 2019-10-30 – 2019-10-31 (×2): 100 mg via ORAL
  Filled 2019-10-30 (×3): qty 2

## 2019-10-30 MED ORDER — BUPIVACAINE-EPINEPHRINE 0.25% -1:200000 IJ SOLN
INTRAMUSCULAR | Status: AC
  Filled 2019-10-30: qty 1

## 2019-10-30 MED ORDER — MIDAZOLAM HCL 2 MG/2ML IJ SOLN
INTRAMUSCULAR | Status: AC
  Filled 2019-10-30: qty 2

## 2019-10-30 MED ORDER — MIDAZOLAM HCL 2 MG/2ML IJ SOLN: 0.5000 mg | Freq: Once | INTRAMUSCULAR | Status: DC | PRN

## 2019-10-30 MED ORDER — HYDROCHLOROTHIAZIDE 12.5 MG PO CAPS
12.5000 mg | ORAL_CAPSULE | Freq: Every day | ORAL | Status: DC
  Administered 2019-10-30 – 2019-10-31 (×2): 12.5 mg via ORAL
  Filled 2019-10-30 (×2): qty 1

## 2019-10-30 MED ORDER — ONDANSETRON HCL 4 MG/2ML IJ SOLN
INTRAMUSCULAR | Status: DC | PRN
  Administered 2019-10-30: 4 mg via INTRAVENOUS

## 2019-10-30 MED ORDER — METOCLOPRAMIDE HCL 5 MG PO TABS: 5.0000 mg | ORAL_TABLET | Freq: Three times a day (TID) | ORAL | Status: DC | PRN

## 2019-10-30 MED ORDER — ONDANSETRON HCL 4 MG/2ML IJ SOLN
INTRAMUSCULAR | Status: AC
  Filled 2019-10-30: qty 6

## 2019-10-30 MED ORDER — HYDROMORPHONE HCL 1 MG/ML IJ SOLN: 0.2500 mg | INTRAMUSCULAR | Status: DC | PRN

## 2019-10-30 MED ORDER — POLYETHYLENE GLYCOL 3350 17 G PO PACK
17.0000 g | PACK | Freq: Two times a day (BID) | ORAL | Status: DC
  Administered 2019-10-30 – 2019-10-31 (×2): 17 g via ORAL
  Filled 2019-10-30 (×2): qty 1

## 2019-10-30 MED ORDER — VORTIOXETINE HBR 5 MG PO TABS
20.0000 mg | ORAL_TABLET | Freq: Every day | ORAL | Status: DC
  Administered 2019-10-30 – 2019-10-31 (×2): 20 mg via ORAL
  Filled 2019-10-30 (×2): qty 4

## 2019-10-30 MED ORDER — BISACODYL 10 MG RE SUPP: 10.0000 mg | Freq: Every day | RECTAL | Status: DC | PRN

## 2019-10-30 MED ORDER — POVIDONE-IODINE 10 % EX SWAB
2.0000 "application " | Freq: Once | CUTANEOUS | Status: AC
  Administered 2019-10-30: 2 via TOPICAL

## 2019-10-30 MED ORDER — CARVEDILOL 12.5 MG PO TABS
12.5000 mg | ORAL_TABLET | Freq: Two times a day (BID) | ORAL | Status: DC
  Administered 2019-10-30 – 2019-10-31 (×2): 12.5 mg via ORAL
  Filled 2019-10-30 (×2): qty 1

## 2019-10-30 MED ORDER — CEFAZOLIN SODIUM-DEXTROSE 2-4 GM/100ML-% IV SOLN
2.0000 g | Freq: Four times a day (QID) | INTRAVENOUS | Status: AC
  Administered 2019-10-30 (×2): 2 g via INTRAVENOUS
  Filled 2019-10-30 (×2): qty 100

## 2019-10-30 MED ORDER — BUPIVACAINE IN DEXTROSE 0.75-8.25 % IT SOLN
INTRATHECAL | Status: DC | PRN
  Administered 2019-10-30: 1.8 mL via INTRATHECAL

## 2019-10-30 MED ORDER — PROMETHAZINE HCL 25 MG/ML IJ SOLN: 6.2500 mg | INTRAMUSCULAR | Status: DC | PRN

## 2019-10-30 MED ORDER — CHLORHEXIDINE GLUCONATE 4 % EX LIQD: 60.0000 mL | Freq: Once | CUTANEOUS | Status: DC

## 2019-10-30 MED ORDER — PROPOFOL 500 MG/50ML IV EMUL
INTRAVENOUS | Status: DC | PRN
  Administered 2019-10-30: 65 ug/kg/min via INTRAVENOUS

## 2019-10-30 MED ORDER — MEPERIDINE HCL 50 MG/ML IJ SOLN: 6.2500 mg | INTRAMUSCULAR | Status: DC | PRN

## 2019-10-30 MED ORDER — SODIUM CHLORIDE (PF) 0.9 % IJ SOLN
INTRAMUSCULAR | Status: DC | PRN
  Administered 2019-10-30: 30 mL

## 2019-10-30 MED ORDER — MAGNESIUM CITRATE PO SOLN: 1.0000 | Freq: Once | ORAL | Status: DC | PRN

## 2019-10-30 MED ORDER — LACTATED RINGERS IV SOLN
INTRAVENOUS | Status: DC
  Administered 2019-10-30 (×2): via INTRAVENOUS

## 2019-10-30 MED ORDER — DEXAMETHASONE SODIUM PHOSPHATE 10 MG/ML IJ SOLN
10.0000 mg | Freq: Once | INTRAMUSCULAR | Status: AC
  Administered 2019-10-31: 10 mg via INTRAVENOUS
  Filled 2019-10-30: qty 1

## 2019-10-30 MED ORDER — KETOROLAC TROMETHAMINE 30 MG/ML IJ SOLN
INTRAMUSCULAR | Status: DC | PRN
  Administered 2019-10-30: 30 mg

## 2019-10-30 MED ORDER — DIPHENHYDRAMINE HCL 12.5 MG/5ML PO ELIX
12.5000 mg | ORAL_SOLUTION | ORAL | Status: DC | PRN
  Administered 2019-10-30: 25 mg via ORAL
  Filled 2019-10-30: qty 10

## 2019-10-30 MED ORDER — DEXAMETHASONE SODIUM PHOSPHATE 10 MG/ML IJ SOLN
INTRAMUSCULAR | Status: DC | PRN
  Administered 2019-10-30: 8 mg via INTRAVENOUS

## 2019-10-30 MED ORDER — DOCUSATE SODIUM 100 MG PO CAPS
100.0000 mg | ORAL_CAPSULE | Freq: Two times a day (BID) | ORAL | Status: DC
  Administered 2019-10-30 – 2019-10-31 (×2): 100 mg via ORAL
  Filled 2019-10-30 (×2): qty 1

## 2019-10-30 MED ORDER — PROPOFOL 10 MG/ML IV BOLUS
INTRAVENOUS | Status: DC | PRN
  Administered 2019-10-30: 10 mg via INTRAVENOUS

## 2019-10-30 MED ORDER — METOCLOPRAMIDE HCL 5 MG/ML IJ SOLN: 5.0000 mg | Freq: Three times a day (TID) | INTRAMUSCULAR | Status: DC | PRN

## 2019-10-30 MED ORDER — ONDANSETRON HCL 4 MG PO TABS: 4.0000 mg | ORAL_TABLET | Freq: Four times a day (QID) | ORAL | Status: DC | PRN

## 2019-10-30 MED ORDER — ROPIVACAINE HCL 7.5 MG/ML IJ SOLN
INTRAMUSCULAR | Status: DC | PRN
  Administered 2019-10-30: 20 mL via PERINEURAL

## 2019-10-30 MED ORDER — HYDROCODONE-ACETAMINOPHEN 5-325 MG PO TABS
1.0000 | ORAL_TABLET | ORAL | Status: DC | PRN
  Administered 2019-10-30 – 2019-10-31 (×4): 2 via ORAL
  Filled 2019-10-30 (×5): qty 2

## 2019-10-30 MED ORDER — FENTANYL CITRATE (PF) 100 MCG/2ML IJ SOLN
INTRAMUSCULAR | Status: AC
  Filled 2019-10-30: qty 2

## 2019-10-30 MED ORDER — PHENOL 1.4 % MT LIQD: 1.0000 | OROMUCOSAL | Status: DC | PRN

## 2019-10-30 MED ORDER — HYDROCODONE-ACETAMINOPHEN 7.5-325 MG PO TABS
1.0000 | ORAL_TABLET | ORAL | Status: DC | PRN
  Administered 2019-10-30 – 2019-10-31 (×2): 2 via ORAL
  Filled 2019-10-30 (×3): qty 2

## 2019-10-30 MED ORDER — MENTHOL 3 MG MT LOZG: 1.0000 | LOZENGE | OROMUCOSAL | Status: DC | PRN

## 2019-10-30 MED ORDER — CEFAZOLIN SODIUM-DEXTROSE 2-4 GM/100ML-% IV SOLN
2.0000 g | INTRAVENOUS | Status: AC
  Administered 2019-10-30: 2 g via INTRAVENOUS

## 2019-10-30 MED ORDER — ACETAMINOPHEN 325 MG PO TABS: 325.0000 mg | ORAL_TABLET | Freq: Four times a day (QID) | ORAL | Status: DC | PRN

## 2019-10-30 MED ORDER — HYDROMORPHONE HCL 1 MG/ML IJ SOLN: 0.5000 mg | INTRAMUSCULAR | Status: DC | PRN

## 2019-10-30 MED ORDER — DEXAMETHASONE SODIUM PHOSPHATE 10 MG/ML IJ SOLN
INTRAMUSCULAR | Status: AC
  Filled 2019-10-30: qty 3

## 2019-10-30 MED ORDER — FENTANYL CITRATE (PF) 100 MCG/2ML IJ SOLN
INTRAMUSCULAR | Status: DC | PRN
  Administered 2019-10-30 (×2): 25 ug via INTRAVENOUS
  Administered 2019-10-30: 50 ug via INTRAVENOUS

## 2019-10-30 MED ORDER — CEFAZOLIN SODIUM-DEXTROSE 2-4 GM/100ML-% IV SOLN
INTRAVENOUS | Status: AC
  Filled 2019-10-30: qty 100

## 2019-10-30 MED ORDER — GABAPENTIN 300 MG PO CAPS
300.0000 mg | ORAL_CAPSULE | Freq: Two times a day (BID) | ORAL | Status: DC
  Administered 2019-10-30 – 2019-10-31 (×2): 300 mg via ORAL
  Filled 2019-10-30 (×2): qty 1

## 2019-10-30 MED ORDER — FERROUS SULFATE 325 (65 FE) MG PO TABS
325.0000 mg | ORAL_TABLET | Freq: Two times a day (BID) | ORAL | Status: DC
  Administered 2019-10-30 – 2019-10-31 (×2): 325 mg via ORAL
  Filled 2019-10-30 (×2): qty 1

## 2019-10-30 MED ORDER — ONDANSETRON HCL 4 MG/2ML IJ SOLN: 4.0000 mg | Freq: Four times a day (QID) | INTRAMUSCULAR | Status: DC | PRN

## 2019-10-30 MED ORDER — TRAZODONE HCL 50 MG PO TABS
50.0000 mg | ORAL_TABLET | Freq: Every evening | ORAL | Status: DC | PRN
  Administered 2019-10-30: 50 mg via ORAL
  Filled 2019-10-30: qty 1

## 2019-10-30 MED ORDER — LOSARTAN POTASSIUM-HCTZ 100-12.5 MG PO TABS: 1.0000 | ORAL_TABLET | Freq: Every day | ORAL | Status: DC

## 2019-10-30 MED ORDER — SODIUM CHLORIDE 0.9 % IV SOLN
INTRAVENOUS | Status: DC
  Administered 2019-10-30 – 2019-10-31 (×2): via INTRAVENOUS

## 2019-10-30 MED ORDER — TRANEXAMIC ACID-NACL 1000-0.7 MG/100ML-% IV SOLN
1000.0000 mg | Freq: Once | INTRAVENOUS | Status: AC
  Administered 2019-10-30: 1000 mg via INTRAVENOUS
  Filled 2019-10-30: qty 100

## 2019-10-30 MED ORDER — DEXAMETHASONE SODIUM PHOSPHATE 10 MG/ML IJ SOLN: 10.0000 mg | Freq: Once | INTRAMUSCULAR | Status: DC

## 2019-10-30 MED ORDER — BUSPIRONE HCL 5 MG PO TABS
15.0000 mg | ORAL_TABLET | Freq: Two times a day (BID) | ORAL | Status: DC
  Administered 2019-10-30 – 2019-10-31 (×2): 15 mg via ORAL
  Filled 2019-10-30 (×2): qty 3

## 2019-10-30 MED ORDER — PHENYLEPHRINE HCL-NACL 10-0.9 MG/250ML-% IV SOLN
INTRAVENOUS | Status: DC | PRN
  Administered 2019-10-30: 10 ug/min via INTRAVENOUS

## 2019-10-30 MED ORDER — METHOCARBAMOL 500 MG IVPB - SIMPLE MED
500.0000 mg | Freq: Four times a day (QID) | INTRAVENOUS | Status: DC | PRN
  Filled 2019-10-30: qty 50

## 2019-10-30 MED ORDER — TRANEXAMIC ACID-NACL 1000-0.7 MG/100ML-% IV SOLN
1000.0000 mg | INTRAVENOUS | Status: AC
  Administered 2019-10-30: 1000 mg via INTRAVENOUS

## 2019-10-30 MED ORDER — TRANEXAMIC ACID-NACL 1000-0.7 MG/100ML-% IV SOLN
INTRAVENOUS | Status: AC
  Filled 2019-10-30: qty 100

## 2019-10-30 MED ORDER — METHOCARBAMOL 500 MG PO TABS
500.0000 mg | ORAL_TABLET | Freq: Four times a day (QID) | ORAL | Status: DC | PRN
  Administered 2019-10-30 – 2019-10-31 (×3): 500 mg via ORAL
  Filled 2019-10-30 (×3): qty 1

## 2019-10-30 MED ORDER — ALUM & MAG HYDROXIDE-SIMETH 200-200-20 MG/5ML PO SUSP: 15.0000 mL | ORAL | Status: DC | PRN

## 2019-10-30 MED ORDER — CELECOXIB 200 MG PO CAPS
200.0000 mg | ORAL_CAPSULE | Freq: Two times a day (BID) | ORAL | Status: DC
  Administered 2019-10-30 – 2019-10-31 (×2): 200 mg via ORAL
  Filled 2019-10-30 (×2): qty 1

## 2019-10-30 SURGICAL SUPPLY — 60 items
ATTUNE MED ANAT PAT 38 KNEE (Knees) ×2 IMPLANT
ATTUNE PS FEM LT SZ 6 CEM KNEE (Femur) ×2 IMPLANT
ATTUNE PSRP INSR SZ6 10 KNEE (Insert) ×2 IMPLANT
BAG ZIPLOCK 12X15 (MISCELLANEOUS) IMPLANT
BASE TIBIAL ROT PLAT SZ 7 KNEE (Knees) ×1 IMPLANT
BLADE SAW SGTL 11.0X1.19X90.0M (BLADE) IMPLANT
BLADE SAW SGTL 13.0X1.19X90.0M (BLADE) ×2 IMPLANT
BLADE SURG SZ10 CARB STEEL (BLADE) ×4 IMPLANT
BNDG ELASTIC 6X5.8 VLCR STR LF (GAUZE/BANDAGES/DRESSINGS) ×2 IMPLANT
BOWL SMART MIX CTS (DISPOSABLE) ×2 IMPLANT
CEMENT HV SMART SET (Cement) ×4 IMPLANT
COVER SURGICAL LIGHT HANDLE (MISCELLANEOUS) ×2 IMPLANT
COVER WAND RF STERILE (DRAPES) IMPLANT
CUFF TOURN SGL QUICK 34 (TOURNIQUET CUFF) ×1
CUFF TRNQT CYL 34X4.125X (TOURNIQUET CUFF) ×1 IMPLANT
DECANTER SPIKE VIAL GLASS SM (MISCELLANEOUS) ×4 IMPLANT
DERMABOND ADVANCED (GAUZE/BANDAGES/DRESSINGS) ×1
DERMABOND ADVANCED .7 DNX12 (GAUZE/BANDAGES/DRESSINGS) ×1 IMPLANT
DRAPE U-SHAPE 47X51 STRL (DRAPES) ×2 IMPLANT
DRESSING AQUACEL AG SP 3.5X10 (GAUZE/BANDAGES/DRESSINGS) ×1 IMPLANT
DRSG AQUACEL AG SP 3.5X10 (GAUZE/BANDAGES/DRESSINGS) ×2
DURAPREP 26ML APPLICATOR (WOUND CARE) ×4 IMPLANT
ELECT REM PT RETURN 15FT ADLT (MISCELLANEOUS) ×2 IMPLANT
GLOVE BIO SURGEON STRL SZ 6 (GLOVE) ×2 IMPLANT
GLOVE BIOGEL PI IND STRL 6.5 (GLOVE) ×1 IMPLANT
GLOVE BIOGEL PI IND STRL 7.5 (GLOVE) ×1 IMPLANT
GLOVE BIOGEL PI IND STRL 8.5 (GLOVE) ×1 IMPLANT
GLOVE BIOGEL PI INDICATOR 6.5 (GLOVE) ×1
GLOVE BIOGEL PI INDICATOR 7.5 (GLOVE) ×1
GLOVE BIOGEL PI INDICATOR 8.5 (GLOVE) ×1
GLOVE ECLIPSE 8.0 STRL XLNG CF (GLOVE) ×2 IMPLANT
GLOVE ORTHO TXT STRL SZ7.5 (GLOVE) ×2 IMPLANT
GOWN STRL REUS W/ TWL LRG LVL3 (GOWN DISPOSABLE) ×1 IMPLANT
GOWN STRL REUS W/TWL 2XL LVL3 (GOWN DISPOSABLE) ×2 IMPLANT
GOWN STRL REUS W/TWL LRG LVL3 (GOWN DISPOSABLE) ×3 IMPLANT
HANDPIECE INTERPULSE COAX TIP (DISPOSABLE) ×1
HOLDER FOLEY CATH W/STRAP (MISCELLANEOUS) IMPLANT
KIT TURNOVER KIT A (KITS) IMPLANT
MANIFOLD NEPTUNE II (INSTRUMENTS) ×2 IMPLANT
NDL SAFETY ECLIPSE 18X1.5 (NEEDLE) IMPLANT
NEEDLE HYPO 18GX1.5 SHARP (NEEDLE)
NS IRRIG 1000ML POUR BTL (IV SOLUTION) ×2 IMPLANT
PACK TOTAL KNEE CUSTOM (KITS) ×2 IMPLANT
PENCIL SMOKE EVACUATOR (MISCELLANEOUS) IMPLANT
PIN FIX SIGMA LCS THRD HI (PIN) ×2 IMPLANT
PIN THREADED HEADED SIGMA (PIN) ×2 IMPLANT
PROTECTOR NERVE ULNAR (MISCELLANEOUS) ×2 IMPLANT
SET HNDPC FAN SPRY TIP SCT (DISPOSABLE) ×1 IMPLANT
SET PAD KNEE POSITIONER (MISCELLANEOUS) ×2 IMPLANT
SUT MNCRL AB 4-0 PS2 18 (SUTURE) ×2 IMPLANT
SUT STRATAFIX PDS+ 0 24IN (SUTURE) ×2 IMPLANT
SUT VIC AB 1 CT1 36 (SUTURE) ×2 IMPLANT
SUT VIC AB 2-0 CT1 27 (SUTURE) ×3
SUT VIC AB 2-0 CT1 TAPERPNT 27 (SUTURE) ×3 IMPLANT
SYR 3ML LL SCALE MARK (SYRINGE) ×2 IMPLANT
TIBIAL BASE ROT PLAT SZ 7 KNEE (Knees) ×2 IMPLANT
TRAY FOLEY MTR SLVR 16FR STAT (SET/KITS/TRAYS/PACK) ×2 IMPLANT
WATER STERILE IRR 1000ML POUR (IV SOLUTION) ×4 IMPLANT
WRAP KNEE MAXI GEL POST OP (GAUZE/BANDAGES/DRESSINGS) ×2 IMPLANT
YANKAUER SUCT BULB TIP 10FT TU (MISCELLANEOUS) ×2 IMPLANT

## 2019-10-30 NOTE — Op Note (Signed)
NAME:  Lee Lee [facility]                      MEDICAL RECORD NO.:  [id number]                             FACILITY:  [facility]      PHYSICIAN:  Pietro Cassis. Alvan Dame, M.D.  DATE OF BIRTH:  09/20/1951      DATE OF PROCEDURE:  10/30/2019                                     OPERATIVE REPORT         PREOPERATIVE DIAGNOSIS:  Left knee osteoarthritis.      POSTOPERATIVE DIAGNOSIS:  Left knee osteoarthritis.      FINDINGS:  The patient was noted to have complete loss of cartilage and   bone-on-bone arthritis with associated osteophytes in the medial and patellofemoral compartments of   the knee with a significant synovitis and associated effusion.  The patient had failed months of conservative treatment including medications, injection therapy, activity modification.     PROCEDURE:  Left total knee replacement.      COMPONENTS USED:  DePuy Attune rotating platform posterior stabilized knee   system, a size 6 femur, 7 tibia, size 10 mm PS AOX insert, and 38 anatomic patellar   button.      SURGEON:  Pietro Cassis. Alvan Dame, M.D.      ASSISTANT:  Griffith Citron, PA-C.      ANESTHESIA:  Regional and Spinal.      SPECIMENS:  None.      COMPLICATION:  None.      DRAINS:  None.  EBL: <100cc      TOURNIQUET TIME:   Total Tourniquet Time Documented: Thigh (Left) - 37 minutes Total: Thigh (Left) - 37 minutes  .      The patient was stable to the recovery room.      INDICATION FOR PROCEDURE:  Lee Lee is a 68 y.o. male patient of   mine.  The patient had been seen, evaluated, and treated for months conservatively in the   office with medication, activity modification, and injections.  The patient had   radiographic changes of bone-on-bone arthritis with endplate sclerosis and osteophytes noted.  Based on the radiographic changes and failed conservative measures, the patient   decided to proceed with definitive treatment, total knee replacement.  Risks of infection, DVT, component failure,  need for revision surgery, neurovascular injury were reviewed in the office setting.  The postop course was reviewed stressing the efforts to maximize post-operative satisfaction and function.  Consent was obtained for benefit of pain   relief.      PROCEDURE IN DETAIL:  The patient was brought to the operative theater.   Once adequate anesthesia, preoperative antibiotics, 2 gm of Ancef,1 gm of Tranexamic Acid, and 10 mg of Decadron administered, the patient was positioned supine with a left thigh tourniquet placed.  The  left lower extremity was prepped and draped in sterile fashion.  A time-   out was performed identifying the patient, planned procedure, and the appropriate extremity.      The left lower extremity was placed in the [facility] leg holder.  The leg was   exsanguinated, tourniquet elevated to 250 mmHg.  A midline incision was   made  followed by median parapatellar arthrotomy.  Following initial   exposure, attention was first directed to the patella.  Precut   measurement was noted to be 22 mm.  I resected down to 13-14 mm and used a   38 anatomic patellar button to restore patellar height as well as cover the cut surface.      The lug holes were drilled and a metal shim was placed to protect the   patella from retractors and saw blade during the procedure.      At this point, attention was now directed to the femur.  The femoral   canal was opened with a drill, irrigated to try to prevent fat emboli.  An   intramedullary rod was passed at 5 degrees valgus, 9 mm of bone was   resected off the distal femur.  Following this resection, the tibia was   subluxated anteriorly.  Using the extramedullary guide, 2 mm of bone was resected off   the proximal medial tibia.  We confirmed the gap would be   stable medially and laterally with a size 6 spacer block as well as confirmed that the tibial cut was perpendicular in the coronal plane, checking with an alignment rod.      Once this was  done, I sized the femur to be a size 6 in the anterior-   posterior dimension, chose a standard component based on medial and   lateral dimension.  The size 6 rotation block was then pinned in   position anterior referenced using the C-clamp to set rotation.  The   anterior, posterior, and  chamfer cuts were made without difficulty nor   notching making certain that I was along the anterior cortex to help   with flexion gap stability.      The final box cut was made off the lateral aspect of distal femur.      At this point, the tibia was sized to be a size 7.  The size 7 tray was   then pinned in position through the medial third of the tubercle,   drilled, and keel punched.  Trial reduction was now carried with a 6 femur,  7 tibia, a size 10 mm PS insert, and the 38 anatomic patella botton.  The knee was brought to full extension with good flexion stability with the patella   tracking through the trochlea without application of pressure.  Given   all these findings the trial components removed.  Final components were   opened and cement was mixed.  The knee was irrigated with normal saline solution and pulse lavage.  The synovial lining was   then injected with 30 cc of 0.25% Marcaine with epinephrine, 1 cc of Toradol and 30 cc of NS for a total of 61 cc.     Final implants were then cemented onto cleaned and dried cut surfaces of bone with the knee brought to extension with a size 10 mm PS trial insert.      Once the cement had fully cured, excess cement was removed   throughout the knee.  I confirmed that I was satisfied with the range of   motion and stability, and the final size 10 mm PS AOX insert was chosen.  It was   placed into the knee.      The tourniquet had been let down at 37 minutes.  No significant   hemostasis was required.  The extensor mechanism was then reapproximated using #1 Vicryl and #  1 Stratafix sutures with the knee   in flexion.  The   remaining wound was  closed with 2-0 Vicryl and running 4-0 Monocryl.   The knee was cleaned, dried, dressed sterilely using Dermabond and   Aquacel dressing.  The patient was then   brought to recovery room in stable condition, tolerating the procedure   well.   Please note that Physician Assistant, Griffith Citron, PA-C was present for the entirety of the case, and was utilized for pre-operative positioning, peri-operative retractor management, general facilitation of the procedure and for primary wound closure at the end of the case.              Pietro Cassis Alvan Dame, M.D.    10/30/2019 8:53 AM

## 2019-10-30 NOTE — Progress Notes (Signed)
Lee Orleans, PA paged regarding the pt's bilateral hand tremors. Pt reported the tremors are new. Will CTM.

## 2019-10-30 NOTE — Discharge Instructions (Signed)

## 2019-10-30 NOTE — Anesthesia Procedure Notes (Signed)
Anesthesia Regional Block: Adductor canal block   Pre-Anesthetic Checklist: ,, timeout performed, Correct Patient, Correct Site, Correct Laterality, Correct Procedure, Correct Position, site marked, Risks and benefits discussed,  Surgical consent,  Pre-op evaluation,  At surgeon's request and post-op pain management  Laterality: Left  Prep: chloraprep       Needles:  Injection technique: Single-shot  Needle Type: Echogenic Needle     Needle Length: 9cm  Needle Gauge: 21     Additional Needles:   Procedures:,,,, ultrasound used (permanent image in chart),,,,  Narrative:  Start time: 10/30/2019 6:56 AM End time: 10/30/2019 7:02 AM Injection made incrementally with aspirations every 5 mL.  Performed by: Personally  Anesthesiologist: Annye Asa, MD  Additional Notes: Pt identified in Holding room.  Monitors applied. Working IV access confirmed. Sterile prep L thigh.  #21ga ECHOgenic needle into adductor canal with US guidance.  20cc 0.75% Ropivacaine injected incrementally after negative test dose.  Patient asymptomatic, VSS, no heme aspirated, tolerated well.  Jenita Seashore, MD

## 2019-10-30 NOTE — Anesthesia Procedure Notes (Signed)
Spinal  Patient location during procedure: OR Start time: 10/30/2019 7:24 AM End time: 10/30/2019 8:33 AM Staffing Anesthesiologist: Annye Asa, MD Resident/CRNA: Lavina Hamman, CRNA Performed: resident/CRNA  Preanesthetic Checklist Completed: patient identified, site marked, surgical consent, pre-op evaluation, timeout performed, IV checked, risks and benefits discussed and monitors and equipment checked Spinal Block Patient position: sitting Prep: DuraPrep Patient monitoring: heart rate, cardiac monitor, continuous pulse ox and blood pressure Approach: midline Location: L3-4 Injection technique: single-shot Needle Needle type: Sprotte  Needle gauge: 24 G Needle length: 9 cm Needle insertion depth: 8 cm Assessment Sensory level: T6 Additional Notes IV functioning, monitors applied to pt. Expiration date of kit checked and confirmed to be in date. Sterile prep and drape, hand hygiene and sterile gloved used. Pt was positioned and spine was prepped in sterile fashion. Skin was anesthetized with lidocaine. Free flow of clear CSF obtained prior to injecting local anesthetic into CSF x 1 attempt. Spinal needle aspirated freely following injection. Needle was carefully withdrawn, and pt tolerated procedure well. Loss of motor and sensory on exam post injection.

## 2019-10-30 NOTE — Transfer of Care (Signed)
Immediate Anesthesia Transfer of Care Note  Patient: Lee Lee  Procedure(s) Performed: Procedure(s) with comments: TOTAL KNEE ARTHROPLASTY (Left) - 70 mins  Patient Location: PACU  Anesthesia Type:Spinal  Level of Consciousness:  sedated, patient cooperative and responds to stimulation  Airway & Oxygen Therapy:Patient Spontanous Breathing and Patient connected to face mask oxgen  Post-op Assessment:  Report given to PACU RN and Post -op Vital signs reviewed and stable  Post vital signs:  Reviewed and stable  Last Vitals:  Vitals:   10/30/19 0539 10/30/19 0919  BP: 128/81 128/90  Pulse: 66 (!) 50  Resp: 20 14  Temp: 37 C   SpO2: 01% 499%    Complications: No apparent anesthesia complications

## 2019-10-30 NOTE — Interval H&P Note (Signed)
History and Physical Interval Note:  10/30/2019 7:17 AM  Lee Lee  has presented today for surgery, with the diagnosis of Left knee osteoarthritis.  The various methods of treatment have been discussed with the patient and family. After consideration of risks, benefits and other options for treatment, the patient has consented to  Procedure(s) with comments: TOTAL KNEE ARTHROPLASTY (Left) - 70 mins as a surgical intervention.  The patient's history has been reviewed, patient examined, no change in status, stable for surgery.  I have reviewed the patient's chart and labs.  Questions were answered to the patient's satisfaction.     Mauri Pole

## 2019-10-30 NOTE — Anesthesia Postprocedure Evaluation (Signed)
Anesthesia Post Note  Patient: Lee Lee  Procedure(s) Performed: TOTAL KNEE ARTHROPLASTY (Left Knee)     Patient location during evaluation: PACU Anesthesia Type: Spinal Level of consciousness: awake and alert, patient cooperative and oriented Pain management: pain level controlled Vital Signs Assessment: post-procedure vital signs reviewed and stable Respiratory status: spontaneous breathing, nonlabored ventilation and respiratory function stable Cardiovascular status: blood pressure returned to baseline and stable Postop Assessment: no apparent nausea or vomiting, spinal receding and patient able to bend at knees Anesthetic complications: no    Last Vitals:  Vitals:   10/30/19 1127 10/30/19 1237  BP: (!) 158/84 (!) 149/90  Pulse: (!) 54 (!) 59  Resp: 16 16  Temp: 36.8 C 36.6 C  SpO2: 99% 100%    Last Pain:  Vitals:   10/30/19 1237  TempSrc: Oral  PainSc:                  Ayushi Pla,E. Donia Yokum

## 2019-10-30 NOTE — Evaluation (Signed)
Physical Therapy Evaluation Patient Details Name: Lee Lee MRN: [id number] DOB: [birth year] Today's Date: 10/30/2019   History of Present Illness  L TKA; PMH R patellar tendon rupture repair  Clinical Impression  Pt is s/p TKA resulting in the deficits listed below (see PT Problem List). Pt ambulated 81' with RW, no loss of balance. Initiated TKA HEP. Good progress expected.  Pt will benefit from skilled PT to increase their independence and safety with mobility to allow discharge to the venue listed below.      Follow Up Recommendations Follow surgeon's recommendation for DC plan and follow-up therapies;Outpatient PT    Equipment Recommendations  None recommended by PT    Recommendations for Other Services       Precautions / Restrictions Precautions Precautions: Knee Precaution Comments: reviewed no pillow under knee Restrictions Weight Bearing Restrictions: No LLE Weight Bearing: Weight bearing as tolerated      Mobility  Bed Mobility Overal bed mobility: Modified Independent             General bed mobility comments: HOB up, used rail  Transfers Overall transfer level: Needs assistance Equipment used: Rolling walker (2 wheeled) Transfers: Sit to/from Stand Sit to Stand: Min guard;From elevated surface         General transfer comment: VCs hand placement  Ambulation/Gait Ambulation/Gait assistance: Min guard Gait Distance (Feet): 70 Feet Assistive device: Rolling walker (2 wheeled) Gait Pattern/deviations: Step-to pattern;Decreased step length - right;Decreased step length - left Gait velocity: decr   General Gait Details: VCs sequencing, no loss of balance  Stairs            Wheelchair Mobility    Modified Rankin (Stroke Patients Only)       Balance Overall balance assessment: Modified Independent                                           Pertinent Vitals/Pain Pain Assessment: 0-10 Pain Score: 5  Pain  Location: L knee Pain Descriptors / Indicators: Sore Pain Intervention(s): Limited activity within patient's tolerance;Monitored during session;Premedicated before session;Ice applied    Home Living Family/patient expects to be discharged to:: (P) Private residence Living Arrangements: (P) Spouse/significant other Available Help at Discharge: (P) Family   Home Access: (P) Stairs to enter   Entrance Stairs-Number of Steps: (P) 2 Home Layout: (P) One level Home Equipment: (P) Walker - 2 wheels;Bedside commode      Prior Function Level of Independence: (P) Independent               Hand Dominance        Extremity/Trunk Assessment   Upper Extremity Assessment Upper Extremity Assessment: Overall WFL for tasks assessed    Lower Extremity Assessment Lower Extremity Assessment: LLE deficits/detail LLE Deficits / Details: SLR 3/5, knee AAROM 10-45* LLE Sensation: WNL    Cervical / Trunk Assessment Cervical / Trunk Assessment: Normal  Communication   Communication: (P) No difficulties  Cognition Arousal/Alertness: Awake/alert Behavior During Therapy: WFL for tasks assessed/performed Overall Cognitive Status: Within Functional Limits for tasks assessed                                        General Comments      Exercises Total Joint Exercises Ankle Circles/Pumps: AROM;Both;15 reps;Supine Quad Sets: AROM;Left;5  reps;Supine Heel Slides: AAROM;Left;5 reps;Supine Long Arc Quad: AROM;Left;5 reps;Seated Goniometric ROM: 10-45* AAROM L knee   Assessment/Plan    PT Assessment Patient needs continued PT services  PT Problem List Decreased activity tolerance;Decreased range of motion;Decreased mobility;Decreased strength;Decreased knowledge of use of DME;Pain       PT Treatment Interventions DME instruction;Gait training;Stair training;Therapeutic exercise;Therapeutic activities;Patient/family education    PT Goals (Current goals can be found in the  Care Plan section)  Acute Rehab PT Goals Patient Stated Goal: golf PT Goal Formulation: With patient Time For Goal Achievement: 11/06/19 Potential to Achieve Goals: Good    Frequency 7X/week   Barriers to discharge        Co-evaluation               AM-PAC PT "6 Clicks" Mobility  Outcome Measure Help needed turning from your back to your side while in a flat bed without using bedrails?: A Little Help needed moving from lying on your back to sitting on the side of a flat bed without using bedrails?: A Little Help needed moving to and from a bed to a chair (including a wheelchair)?: A Little Help needed standing up from a chair using your arms (e.g., wheelchair or bedside chair)?: A Little Help needed to walk in hospital room?: A Little Help needed climbing 3-5 steps with a railing? : A Lot 6 Click Score: 17    End of Session Equipment Utilized During Treatment: Gait belt Activity Tolerance: Patient tolerated treatment well Patient left: in chair;with call bell/phone within reach;with family/visitor present;with chair alarm set Nurse Communication: Mobility status PT Visit Diagnosis: Difficulty in walking, not elsewhere classified (R26.2)    Time: 5093-2671 PT Time Calculation (min) (ACUTE ONLY): 23 min   Charges:   PT Evaluation $PT Eval Low Complexity: 1 Low PT Treatments $Gait Training: 8-22 mins        Blondell Reveal Kistler PT 10/30/2019  Acute Rehabilitation Services Pager [phone] Office [phone]

## 2019-10-31 DIAGNOSIS — M1712 Unilateral primary osteoarthritis, left knee: Secondary | ICD-10-CM | POA: Diagnosis not present

## 2019-10-31 DIAGNOSIS — E669 Obesity, unspecified: Secondary | ICD-10-CM | POA: Diagnosis present

## 2019-10-31 LAB — BASIC METABOLIC PANEL
Anion gap: 9 (ref 5–15)
BUN: 15 mg/dL (ref 8–23)
CO2: 25 mmol/L (ref 22–32)
Calcium: 8.6 mg/dL — ABNORMAL LOW (ref 8.9–10.3)
Chloride: 100 mmol/L (ref 98–111)
Creatinine, Ser: 0.75 mg/dL (ref 0.61–1.24)
GFR calc Af Amer: >60 mL/min (ref >60)
GFR calc non Af Amer: >60 mL/min (ref >60)
Glucose, Bld: 194 mg/dL — ABNORMAL HIGH (ref 70–99)
Potassium: 3.8 mmol/L (ref 3.5–5.1)
Sodium: 134 mmol/L — ABNORMAL LOW (ref 135–145)

## 2019-10-31 LAB — CBC
HCT: 32.2 % — ABNORMAL LOW (ref 39.0–52.0)
Hemoglobin: 10.7 g/dL — ABNORMAL LOW (ref 13.0–17.0)
MCH: 35.4 pg — ABNORMAL HIGH (ref 26.0–34.0)
MCHC: 33.2 g/dL (ref 30.0–36.0)
MCV: 106.6 fL — ABNORMAL HIGH (ref 80.0–100.0)
Platelets: 131 10*3/uL — ABNORMAL LOW (ref 150–400)
RBC: 3.02 MIL/uL — ABNORMAL LOW (ref 4.22–5.81)
RDW: 12.3 % (ref 11.5–15.5)
WBC: 11.7 10*3/uL — ABNORMAL HIGH (ref 4.0–10.5)
nRBC: 0 % (ref 0.0–0.2)

## 2019-10-31 MED ORDER — HYDROCODONE-ACETAMINOPHEN 7.5-325 MG PO TABS: 1.0000 | ORAL_TABLET | ORAL | 0 refills | Status: DC | PRN

## 2019-10-31 MED ORDER — FERROUS SULFATE 325 (65 FE) MG PO TABS: 325.0000 mg | ORAL_TABLET | Freq: Three times a day (TID) | ORAL | 0 refills | Status: DC

## 2019-10-31 MED ORDER — METHOCARBAMOL 500 MG PO TABS: 500.0000 mg | ORAL_TABLET | Freq: Four times a day (QID) | ORAL | 0 refills | Status: DC | PRN

## 2019-10-31 MED ORDER — DOCUSATE SODIUM 100 MG PO CAPS: 100.0000 mg | ORAL_CAPSULE | Freq: Two times a day (BID) | ORAL | 0 refills | Status: DC

## 2019-10-31 MED ORDER — ASPIRIN 81 MG PO CHEW: 81.0000 mg | CHEWABLE_TABLET | Freq: Two times a day (BID) | ORAL | 0 refills | Status: AC

## 2019-10-31 MED ORDER — POLYETHYLENE GLYCOL 3350 17 G PO PACK: 17.0000 g | PACK | Freq: Two times a day (BID) | ORAL | 0 refills | Status: DC

## 2019-10-31 NOTE — Progress Notes (Signed)
     Subjective: 1 Day Post-Op Procedure(s) (LRB): TOTAL KNEE ARTHROPLASTY (Left)   Patient reports pain as mild, pain controlled. No reported events throughout the night.  Dr. Alvan Dame discussed the procedure, findings and expectations moving forward.  Ready to be discharged home. He will follow up in the clinic in 2 weeks.  He knows to call with any questions or concerns.    Patient's anticipated LOS is less than 2 midnights, meeting these requirements: - Lives within 1 hour of care - Has a competent adult at home to recover with post-op recover - NO history of  - Chronic pain requiring opiods  - Diabetes  - Coronary Artery Disease  - Heart failure  - Heart attack  - Stroke  - DVT/VTE  - Cardiac arrhythmia  - Respiratory Failure/COPD  - Renal failure  - Anemia  - Advanced Liver disease     Objective:   VITALS:   Vitals:   10/31/19 0618 10/31/19 0725  BP: (!) 186/99 (!) 166/92  Pulse: (!) 53 (!) 55  Resp: 16 16  Temp: 97.8 F (36.6 C)   SpO2: 98% 98%    Dorsiflexion/Plantar flexion intact Incision: dressing C/D/I No cellulitis present Compartment soft  LABS Recent Labs    10/31/19 0245  HGB 10.7*  HCT 32.2*  WBC 11.7*  PLT 131*    Recent Labs    10/31/19 0245  NA 134*  K 3.8  BUN 15  CREATININE 0.75  GLUCOSE 194*     Assessment/Plan: 1 Day Post-Op Procedure(s) (LRB): TOTAL KNEE ARTHROPLASTY (Left) Foley cath d/c'ed Advance diet Up with therapy D/C IV fluids Discharge home Follow up in 2 weeks at [facility] Follow up with OLIN,Ersel Enslin D in 2 weeks.  Contact information:  [facility] [address] [place] [phone]    Obese (BMI 30-39.9) Estimated body mass index is 31.97 kg/m as calculated from the following:   Height as of 10/23/19: 5' 11"  (1.803 m).   Weight as of 10/23/19: 104 kg. Patient also counseled that weight may inhibit the healing process Patient counseled that losing weight  will help with future health issues      West Pugh. Jonika Critz   PAC  10/31/2019, 8:19 AM

## 2019-10-31 NOTE — Progress Notes (Signed)
Physical Therapy Treatment Patient Details Name: Lee Lee MRN: [id number] DOB: 02-Apr-1951 Today's Date: 10/31/2019    History of Present Illness L TKA; PMH R patellar tendon rupture repair    PT Comments    Pt is progressing well with mobility and is ready to DC home from PT standpoint. He ambulated 160' with RW, completed stair training, and demonstrates good understanding of HEP.   Follow Up Recommendations  Follow surgeon's recommendation for DC plan and follow-up therapies;Outpatient PT     Equipment Recommendations  None recommended by PT    Recommendations for Other Services       Precautions / Restrictions Precautions Precautions: Knee Precaution Booklet Issued: Yes (comment) Precaution Comments: reviewed no pillow under knee Restrictions Weight Bearing Restrictions: No LLE Weight Bearing: Weight bearing as tolerated    Mobility  Bed Mobility Overal bed mobility: Modified Independent             General bed mobility comments: HOB up, used rail  Transfers Overall transfer level: Needs assistance Equipment used: Rolling walker (2 wheeled) Transfers: Sit to/from Stand Sit to Stand: From elevated surface;Supervision         General transfer comment: VCs hand placement  Ambulation/Gait Ambulation/Gait assistance: Supervision Gait Distance (Feet): 160 Feet Assistive device: Rolling walker (2 wheeled) Gait Pattern/deviations: Decreased step length - right;Decreased step length - left;Step-through pattern Gait velocity: decr   General Gait Details: VCs sequencing, no loss of balance   Stairs Stairs: Yes Stairs assistance: Min guard Stair Management: One rail Right;With cane;Forwards;Step to pattern Number of Stairs: 2 General stair comments: VCs sequencing   Wheelchair Mobility    Modified Rankin (Stroke Patients Only)       Balance Overall balance assessment: Modified Independent                                           Cognition Arousal/Alertness: Awake/alert Behavior During Therapy: WFL for tasks assessed/performed Overall Cognitive Status: Within Functional Limits for tasks assessed                                        Exercises Total Joint Exercises Ankle Circles/Pumps: AROM;Both;15 reps;Supine Quad Sets: AROM;Left;5 reps;Supine Short Arc Quad: AROM;Left;10 reps;Supine Heel Slides: AAROM;Left;Supine;10 reps Hip ABduction/ADduction: AROM;Left;10 reps;Supine Straight Leg Raises: AROM;Left;5 reps;Supine Long Arc Quad: AROM;Left;Seated;10 reps Knee Flexion: AAROM;Left;10 reps;Seated Goniometric ROM: 5-85* AAROM L knee    General Comments        Pertinent Vitals/Pain Pain Score: 5  Pain Location: L knee Pain Descriptors / Indicators: Sore Pain Intervention(s): Limited activity within patient's tolerance;Monitored during session;Premedicated before session;Ice applied    Home Living                      Prior Function            PT Goals (current goals can now be found in the care plan section) Acute Rehab PT Goals Patient Stated Goal: golf PT Goal Formulation: With patient Time For Goal Achievement: 11/06/19 Potential to Achieve Goals: Good Progress towards PT goals: Progressing toward goals    Frequency    7X/week      PT Plan Current plan remains appropriate    Co-evaluation  AM-PAC PT "6 Clicks" Mobility   Outcome Measure  Help needed turning from your back to your side while in a flat bed without using bedrails?: A Little Help needed moving from lying on your back to sitting on the side of a flat bed without using bedrails?: A Little Help needed moving to and from a bed to a chair (including a wheelchair)?: None Help needed standing up from a chair using your arms (e.g., wheelchair or bedside chair)?: None Help needed to walk in hospital room?: None Help needed climbing 3-5 steps with a railing? : A Little 6  Click Score: 21    End of Session Equipment Utilized During Treatment: Gait belt Activity Tolerance: Patient tolerated treatment well Patient left: in chair;with call bell/phone within reach Nurse Communication: Mobility status PT Visit Diagnosis: Difficulty in walking, not elsewhere classified (R26.2);Pain Pain - Right/Left: Left Pain - part of body: Knee     Time: 4765-4650 PT Time Calculation (min) (ACUTE ONLY): 23 min  Charges:  $Gait Training: 8-22 mins $Therapeutic Exercise: 8-22 mins                    Blondell Reveal Kistler PT 10/31/2019  Acute Rehabilitation Services Pager [phone] Office [phone]

## 2019-10-31 NOTE — Plan of Care (Signed)
  Problem: Education: Goal: Knowledge of General Education information will improve Description: Including pain rating scale, medication(s)/side effects and non-pharmacologic comfort measures 10/31/2019 1148 by Hubert Azure, RN Outcome: Adequate for Discharge 10/31/2019 0856 by Hubert Azure, RN Outcome: Progressing   Problem: Health Behavior/Discharge Planning: Goal: Ability to manage health-related needs will improve 10/31/2019 1148 by Hubert Azure, RN Outcome: Adequate for Discharge 10/31/2019 0856 by Hubert Azure, RN Outcome: Progressing   Problem: Clinical Measurements: Goal: Ability to maintain clinical measurements within normal limits will improve 10/31/2019 1148 by Hubert Azure, RN Outcome: Adequate for Discharge 10/31/2019 0856 by Hubert Azure, RN Outcome: Progressing Goal: Will remain free from infection 10/31/2019 1148 by Hubert Azure, RN Outcome: Adequate for Discharge 10/31/2019 0856 by Hubert Azure, RN Outcome: Progressing Goal: Diagnostic test results will improve 10/31/2019 1148 by Hubert Azure, RN Outcome: Adequate for Discharge 10/31/2019 0856 by Hubert Azure, RN Outcome: Progressing Goal: Cardiovascular complication will be avoided Outcome: Adequate for Discharge   Problem: Activity: Goal: Risk for activity intolerance will decrease Outcome: Adequate for Discharge   Problem: Nutrition: Goal: Adequate nutrition will be maintained Outcome: Adequate for Discharge   Problem: Coping: Goal: Level of anxiety will decrease Outcome: Adequate for Discharge   Problem: Elimination: Goal: Will not experience complications related to bowel motility Outcome: Adequate for Discharge Goal: Will not experience complications related to urinary retention Outcome: Adequate for Discharge   Problem: Pain Managment: Goal: General experience of comfort will improve Outcome: Adequate for Discharge   Problem: Safety: Goal: Ability to remain free  from injury will improve Outcome: Adequate for Discharge   Problem: Skin Integrity: Goal: Risk for impaired skin integrity will decrease Outcome: Adequate for Discharge   Problem: Education: Goal: Knowledge of the prescribed therapeutic regimen will improve Outcome: Adequate for Discharge Goal: Individualized Educational Video(s) Outcome: Adequate for Discharge   Problem: Activity: Goal: Ability to avoid complications of mobility impairment will improve Outcome: Adequate for Discharge Goal: Range of joint motion will improve Outcome: Adequate for Discharge   Problem: Clinical Measurements: Goal: Postoperative complications will be avoided or minimized Outcome: Adequate for Discharge   Problem: Pain Management: Goal: Pain level will decrease with appropriate interventions Outcome: Adequate for Discharge   Problem: Skin Integrity: Goal: Will show signs of wound healing Outcome: Adequate for Discharge

## 2019-10-31 NOTE — Plan of Care (Signed)

## 2019-11-01 ENCOUNTER — Telehealth: Payer: Self-pay | Admitting: *Deleted

## 2019-11-01 ENCOUNTER — Encounter (HOSPITAL_COMMUNITY): Payer: Self-pay | Admitting: Orthopedic Surgery

## 2019-11-01 NOTE — Telephone Encounter (Signed)
Pt was on TCM report admitted 10/30/19 for left total knee arthroplasty. Pt tolerated procedure well, and will follow=up w/specialist in 2 weeks.Marland KitchenJohny Chess

## 2019-11-01 NOTE — Progress Notes (Signed)
Subjective: 68 year old male presents for follow-up evaluation of calluses.  He states the urea cream is doing well.  Overall the callus is much improved.  He is having no significant discomfort about the heel.  No recent injury or changes otherwise. Denies any systemic complaints such as fevers, chills, nausea, vomiting. No acute changes since last appointment, and no other complaints at this time.   Objective: AAO x3, NAD DP/PT pulses palpable bilaterally, CRT less than 3 seconds There is still mild callus present bilateral feet on first MPJ, hallux.  No underlying ulceration drainage or signs of infection.  Is no skin fissures or open sores.  No signs of infection.  No pain to the posterior heel.  Thompson test is negative. No pain with calf compression, swelling, warmth, erythema  Assessment: Improving calluses; heel spur  Plan: -All treatment options discussed with the patient including all alternatives, risks, complications.  -Debrided the hyperkeratotic lesions today with any complications or bleeding.  Continue with the urea cream. -Continue offloading for the heel spur as well stretching, icing as well as supportive shoes and inserts.  Consider heel lift as well.  Voltaren gel as needed. -Patient encouraged to call the office with any questions, concerns, change in symptoms.   Return if symptoms worsen or fail to improve.  Trula Slade DPM

## 2019-11-02 DIAGNOSIS — M25562 Pain in left knee: Secondary | ICD-10-CM | POA: Diagnosis not present

## 2019-11-06 DIAGNOSIS — M25562 Pain in left knee: Secondary | ICD-10-CM | POA: Diagnosis not present

## 2019-11-06 NOTE — Discharge Summary (Signed)
Physician Discharge Summary  Patient ID: DILLINGER ASTON MRN: [id number] DOB/AGE: 1951/05/15 68 y.o.  Admit date: 10/30/2019 Discharge date: 10/31/2019   Procedures:  Procedure(s) (LRB): TOTAL KNEE ARTHROPLASTY (Left)  Attending Physician:  Dr. Paralee Cancel   Admission Diagnoses:   Left knee primary OA / pain  Discharge Diagnoses:  Principal Problem:   S/P left TKA Active Problems:   Obese  Past Medical History:  Diagnosis Date  . Alcohol abuse, episodic drinking behavior   . Anxiety   . Arthritis    back & knees  . Atrial flutter ([place])    s/p RFCA 01/05/13  . Calcium oxalate renal stones   . Complication of anesthesia    makes him loopy  . Depression   . ED (erectile dysfunction)   . Hemorrhoids   . Hypertension     HPI:    Lee Lee, 68 y.o. male, has a history of pain and functional disability in the left knee due to arthritis and has failed non-surgical conservative treatments for greater than 12 weeks to include NSAID's and/or analgesics, corticosteriod injections and activity modification.  Onset of symptoms was gradual, starting ~1 years ago with gradually worsening course since that time. The patient noted prior procedures on the knee to include  arthroscopy and menisectomy on the left knee(s).  Patient currently rates pain in the left knee(s) at 8 out of 10 with activity. Patient has worsening of pain with activity and weight bearing, pain that interferes with activities of daily living, pain with passive range of motion, crepitus and joint swelling.  Patient has evidence of periarticular osteophytes and joint space narrowing by imaging studies. There is no active infection.  Risks, benefits and expectations were discussed with the patient.  Risks including but not limited to the risk of anesthesia, blood clots, nerve damage, blood vessel damage, failure of the prosthesis, infection and up to and including death.  Patient understand the risks, benefits and  expectations and wishes to proceed with surgery.  PCP: Janith Lima, MD   Discharged Condition: good  Hospital Course:  Patient underwent the above stated procedure on 10/30/2019. Patient tolerated the procedure well and brought to the recovery room in good condition and subsequently to the floor.  POD #1 BP: 166/92 ; Pulse: 55 ; Temp: 97.8 F (36.6 C) ; Resp: 16 Patient reports pain as mild, pain controlled. No reported events throughout the night.  Dr. Alvan Dame discussed the procedure, findings and expectations moving forward.  Ready to be discharged home. Dorsiflexion/plantar flexion intact, incision: dressing C/D/I, no cellulitis present and compartment soft.   LABS  Basename    HGB     10.7  HCT     32.2    Discharge Exam: General appearance: alert, cooperative and no distress Extremities: Homans sign is negative, no sign of DVT, no edema, redness or tenderness in the calves or thighs and no ulcers, gangrene or trophic changes  Disposition:  Home with follow up in 2 weeks   Follow-up Information    Paralee Cancel, MD. Schedule an appointment as soon as possible for a visit in 2 weeks.   Specialty: Orthopedic Surgery Contact information: [address] [place] [address] [phone]           Discharge Instructions    Call MD / Call 911   Complete by: As directed    If you experience chest pain or shortness of breath, CALL 911 and be transported to the hospital emergency  room.  If you develope a fever above 101 F, pus (white drainage) or increased drainage or redness at the wound, or calf pain, call your surgeon's office.   Change dressing   Complete by: As directed    Maintain surgical dressing until follow up in the clinic. If the edges start to pull up, may reinforce with tape. If the dressing is no longer working, may remove and cover with gauze and tape, but must keep the area dry and clean.  Call with any questions or concerns.   Constipation  Prevention   Complete by: As directed    Drink plenty of fluids.  Prune juice may be helpful.  You may use a stool softener, such as Colace (over the counter) 100 mg twice a day.  Use MiraLax (over the counter) for constipation as needed.   Diet - low sodium heart healthy   Complete by: As directed    Discharge instructions   Complete by: As directed    Maintain surgical dressing until follow up in the clinic. If the edges start to pull up, may reinforce with tape. If the dressing is no longer working, may remove and cover with gauze and tape, but must keep the area dry and clean.  Follow up in 2 weeks at [facility]. Call with any questions or concerns.   Increase activity slowly as tolerated   Complete by: As directed    Weight bearing as tolerated with assist device (walker, cane, etc) as directed, use it as long as suggested by your surgeon or therapist, typically at least 4-6 weeks.   TED hose   Complete by: As directed    Use stockings (TED hose) for 2 weeks on both leg(s).  You may remove them at night for sleeping.      Allergies as of 10/31/2019   No Known Allergies     Medication List    STOP taking these medications   aspirin EC 81 MG tablet Replaced by: aspirin 81 MG chewable tablet   naproxen sodium 220 MG tablet Commonly known as: ALEVE     TAKE these medications   aspirin 81 MG chewable tablet Commonly known as: Aspirin Childrens Chew 1 tablet (81 mg total) by mouth 2 (two) times daily. Take for 4 weeks, then resume regular dose. Replaces: aspirin EC 81 MG tablet   azelastine 0.1 % nasal spray Commonly known as: ASTELIN Place 1 spray into both nostrils 2 (two) times daily. Use in each nostril as directed   busPIRone 15 MG tablet Commonly known as: BUSPAR Take one tablet twice daily. What changed:   how much to take  how to take this  when to take this  additional instructions   carvedilol 12.5 MG tablet Commonly known as: Coreg Take 1  tablet (12.5 mg total) by mouth 2 (two) times daily with a meal.   docusate sodium 100 MG capsule Commonly known as: Colace Take 1 capsule (100 mg total) by mouth 2 (two) times daily.   ferrous sulfate 325 (65 FE) MG tablet Commonly known as: FerrouSul Take 1 tablet (325 mg total) by mouth 3 (three) times daily with meals for 14 days.   gabapentin 300 MG capsule Commonly known as: NEURONTIN Take 300 mg by mouth 2 (two) times daily.   HYDROcodone-acetaminophen 7.5-325 MG tablet Commonly known as: Norco Take 1-2 tablets by mouth every 4 (four) hours as needed for moderate pain.   L-Methylfolate 15 MG Tabs Take 1 tablet (15 mg total) by  mouth daily.   Latuda 20 MG Tabs tablet Generic drug: lurasidone Take 1 tablet (20 mg total) by mouth daily.   levocetirizine 5 MG tablet Commonly known as: Xyzal Take 1 tablet (5 mg total) by mouth every evening.   losartan-hydrochlorothiazide 100-12.5 MG tablet Commonly known as: HYZAAR TAKE ONE TABLET BY MOUTH DAILY   methocarbamol 500 MG tablet Commonly known as: Robaxin Take 1 tablet (500 mg total) by mouth every 6 (six) hours as needed for muscle spasms.   mupirocin ointment 2 % Commonly known as: BACTROBAN Apply 1 application topically 2 (two) times daily.   polyethylene glycol 17 g packet Commonly known as: MIRALAX / GLYCOLAX Take 17 g by mouth 2 (two) times daily.   traZODone 50 MG tablet Commonly known as: DESYREL Take 1 tablet (50 mg total) by mouth at bedtime. What changed:   when to take this  reasons to take this   vortioxetine HBr 20 MG Tabs tablet Commonly known as: Trintellix Take 1 tablet (20 mg total) by mouth daily.            Discharge Care Instructions  (From admission, onward)         Start     Ordered   10/31/19 0000  Change dressing    Comments: Maintain surgical dressing until follow up in the clinic. If the edges start to pull up, may reinforce with tape. If the dressing is no longer  working, may remove and cover with gauze and tape, but must keep the area dry and clean.  Call with any questions or concerns.   10/31/19 0827           Signed: West Pugh. Morine Kohlman   PA-C  11/06/2019, 8:17 AM

## 2019-11-07 ENCOUNTER — Other Ambulatory Visit: Payer: Self-pay | Admitting: Student

## 2019-11-12 ENCOUNTER — Ambulatory Visit (INDEPENDENT_AMBULATORY_CARE_PROVIDER_SITE_OTHER): Payer: PPO | Admitting: Adult Health

## 2019-11-12 ENCOUNTER — Other Ambulatory Visit: Payer: Self-pay

## 2019-11-12 ENCOUNTER — Encounter: Payer: Self-pay | Admitting: Adult Health

## 2019-11-12 DIAGNOSIS — F411 Generalized anxiety disorder: Secondary | ICD-10-CM

## 2019-11-12 DIAGNOSIS — F331 Major depressive disorder, recurrent, moderate: Secondary | ICD-10-CM

## 2019-11-12 DIAGNOSIS — G4700 Insomnia, unspecified: Secondary | ICD-10-CM | POA: Diagnosis not present

## 2019-11-12 DIAGNOSIS — F3181 Bipolar II disorder: Secondary | ICD-10-CM

## 2019-11-12 NOTE — Progress Notes (Signed)
Lee Lee [id number] [birth year] 68 y.o.  Virtual Visit via Telephone Note  I connected with pt on 11/12/19 at  8:00 AM EST by telephone and verified that I am speaking with the correct person using two identifiers.   I discussed the limitations, risks, security and privacy concerns of performing an evaluation and management service by telephone and the availability of in person appointments. I also discussed with the patient that there may be a patient responsible charge related to this service. The patient expressed understanding and agreed to proceed.   I discussed the assessment and treatment plan with the patient. The patient was provided an opportunity to ask questions and all were answered. The patient agreed with the plan and demonstrated an understanding of the instructions.   The patient was advised to call back or seek an in-person evaluation if the symptoms worsen or if the condition fails to improve as anticipated.  I provided 30 minutes of non-face-to-face time during this encounter.  The patient was located at home.  The provider was located at [place].   Lee Gell, NP   Subjective:   Patient ID:  Lee Lee is a 68 y.o. (DOB 1950/12/22) male.  Chief Complaint: No chief complaint on file.   HPI Lee Lee presents for follow-up of anxiety, insomnia, BPD 2, and depression.  HPI Lee Lee presents today for follow-up of anxiety, insomnia, BPD2, and depression.  Describes mood today as "ok". Mood symptoms - denies anxiety, depression and irritability. Stating "I'm doing much better than I was, best I've been in a long time". Denies any highs or lows - "even keel". Tried Latuda - "not much difference and it was too expensive". Recovering from knee replacement 2 weeks ago. Seeing surgeon this morning for follow-up. Has been involved in P/T. He and wife doing well. Looking forward to seeing son over the holidays. Maintains  sobriety. Stable interest and motivation. Taking medications as prescribed.  Energy levels stable. Active, does not have has a regular exercise routine with recent knee replacement. Retired. Enjoys some usual interests. Married. Lives with wife. Talking with son and daughter - video chat.  Appetite decreased. Weight stable - 222.  Sleeps well most nights. Averages 6 to 8 hours.  Focus and concentration stable. Completing tasks. Managing aspects of household.  Denies SI or HI. Denies AH or VH.  Review of Systems:  Review of Systems  Musculoskeletal: Negative for gait problem.  Neurological: Negative for tremors.  Psychiatric/Behavioral:       Please refer to HPI    Medications: I have reviewed the patient's current medications.  Current Outpatient Medications  Medication Sig Dispense Refill  . aspirin (ASPIRIN CHILDRENS) 81 MG chewable tablet Chew 1 tablet (81 mg total) by mouth 2 (two) times daily. Take for 4 weeks, then resume regular dose. 60 tablet 0  . azelastine (ASTELIN) 0.1 % nasal spray Place 1 spray into both nostrils 2 (two) times daily. Use in each nostril as directed 90 mL 1  . busPIRone (BUSPAR) 15 MG tablet Take one tablet twice daily. (Patient taking differently: Take 15 mg by mouth 2 (two) times daily. ) 180 tablet 1  . carvedilol (COREG) 12.5 MG tablet TAKE ONE TABLET BY MOUTH TWICE A DAY WITH A MEAL 60 tablet 10  . docusate sodium (COLACE) 100 MG capsule Take 1 capsule (100 mg total) by mouth 2 (two) times daily. 28 capsule 0  . ferrous sulfate (FERROUSUL) 325 (65 FE) MG tablet  Take 1 tablet (325 mg total) by mouth 3 (three) times daily with meals for 14 days. 42 tablet 0  . gabapentin (NEURONTIN) 300 MG capsule Take 300 mg by mouth 2 (two) times daily.     Marland Kitchen HYDROcodone-acetaminophen (NORCO) 7.5-325 MG tablet Take 1-2 tablets by mouth every 4 (four) hours as needed for moderate pain. 60 tablet 0  . L-Methylfolate 15 MG TABS Take 1 tablet (15 mg total) by mouth daily. 90  tablet 1  . levocetirizine (XYZAL) 5 MG tablet Take 1 tablet (5 mg total) by mouth every evening. 90 tablet 1  . losartan-hydrochlorothiazide (HYZAAR) 100-12.5 MG tablet TAKE ONE TABLET BY MOUTH DAILY (Patient taking differently: Take 1 tablet by mouth daily. ) 90 tablet 0  . lurasidone (LATUDA) 20 MG TABS tablet Take 1 tablet (20 mg total) by mouth daily. 30 tablet 1  . methocarbamol (ROBAXIN) 500 MG tablet Take 1 tablet (500 mg total) by mouth every 6 (six) hours as needed for muscle spasms. 40 tablet 0  . mupirocin ointment (BACTROBAN) 2 % Apply 1 application topically 2 (two) times daily. 30 g 2  . polyethylene glycol (MIRALAX / GLYCOLAX) 17 g packet Take 17 g by mouth 2 (two) times daily. 28 packet 0  . traZODone (DESYREL) 50 MG tablet Take 1 tablet (50 mg total) by mouth at bedtime. (Patient taking differently: Take 50 mg by mouth at bedtime as needed for sleep. ) 90 tablet 1  . vortioxetine HBr (TRINTELLIX) 20 MG TABS tablet Take 1 tablet (20 mg total) by mouth daily. 90 tablet 1   No current facility-administered medications for this visit.    Medication Side Effects: None  Allergies: No Known Allergies  Past Medical History:  Diagnosis Date  . Alcohol abuse, episodic drinking behavior   . Anxiety   . Arthritis    back & knees  . Atrial flutter ([place])    s/p RFCA 01/05/13  . Calcium oxalate renal stones   . Complication of anesthesia    makes him loopy  . Depression   . ED (erectile dysfunction)   . Hemorrhoids   . Hypertension     Family History  Problem Relation Age of Onset  . Valvular heart disease Mother   . Lymphoma Mother     Social History   Socioeconomic History  . Marital status: Married    Spouse name: Not on file  . Number of children: Not on file  . Years of education: Not on file  . Highest education level: Not on file  Occupational History  . Occupation: Retired  Tobacco Use  . Smoking status: Former Smoker    Quit date: 01/06/1988    Years since  quitting: 31.8  . Smokeless tobacco: Current User    Types: Chew  . Tobacco comment: chews tobacco when playing golf only  Substance and Sexual Activity  . Alcohol use: Not Currently    Alcohol/week: 5.0 standard drinks    Types: 5 Glasses of wine per week    Comment: wine daily  . Drug use: No  . Sexual activity: Yes  Other Topics Concern  . Not on file  Social History Narrative  . Not on file   Social Determinants of Health   Financial Resource Strain: Low Risk   . Difficulty of Paying Living Expenses: Not hard at all  Food Insecurity: No Food Insecurity  . Worried About Charity fundraiser in the Last Year: Never true  . Ran Out of Food  in the Last Year: Never true  Transportation Needs: No Transportation Needs  . Lack of Transportation (Medical): No  . Lack of Transportation (Non-Medical): No  Physical Activity: Sufficiently Active  . Days of Exercise per Week: 5 days  . Minutes of Exercise per Session: 50 min  Stress: No Stress Concern Present  . Feeling of Stress : Not at all  Social Connections: Unknown  . Frequency of Communication with Friends and Family: More than three times a week  . Frequency of Social Gatherings with Friends and Family: More than three times a week  . Attends Religious Services: Never  . Active Member of Clubs or Organizations: Not on file  . Attends Archivist Meetings: Never  . Marital Status: Not on file  Intimate Partner Violence:   . Fear of Current or Ex-Partner: Not on file  . Emotionally Abused: Not on file  . Physically Abused: Not on file  . Sexually Abused: Not on file    Past Medical History, Surgical history, Social history, and Family history were reviewed and updated as appropriate.   Please see review of systems for further details on the patient's review from today.   Objective:   Physical Exam:  There were no vitals taken for this visit.  Physical Exam Neurological:     Mental Status: He is alert and  oriented to person, place, and time.     Cranial Nerves: No dysarthria.  Psychiatric:        Attention and Perception: Attention and perception normal.        Mood and Affect: Mood normal.        Speech: Speech normal.        Behavior: Behavior is cooperative.        Thought Content: Thought content normal. Thought content is not paranoid or delusional. Thought content does not include homicidal or suicidal ideation. Thought content does not include homicidal or suicidal plan.        Cognition and Memory: Cognition and memory normal.        Judgment: Judgment normal.     Comments: Insight intact     Lab Review:     Component Value Date/Time   NA 134 (L) 10/31/2019 0245   NA 140 07/12/2019 0909   K 3.8 10/31/2019 0245   CL 100 10/31/2019 0245   CO2 25 10/31/2019 0245   GLUCOSE 194 (H) 10/31/2019 0245   BUN 15 10/31/2019 0245   BUN 12 07/12/2019 0909   CREATININE 0.75 10/31/2019 0245   CALCIUM 8.6 (L) 10/31/2019 0245   PROT 7.2 12/26/2018 1342   ALBUMIN 4.3 12/26/2018 1342   AST 40 (H) 12/26/2018 1342   ALT 28 12/26/2018 1342   ALKPHOS 62 12/26/2018 1342   BILITOT 0.6 12/26/2018 1342   GFRNONAA >60 10/31/2019 0245   GFRAA >60 10/31/2019 0245       Component Value Date/Time   WBC 11.7 (H) 10/31/2019 0245   RBC 3.02 (L) 10/31/2019 0245   HGB 10.7 (L) 10/31/2019 0245   HGB 13.5 07/12/2019 0909   HCT 32.2 (L) 10/31/2019 0245   HCT 38.7 07/12/2019 0909   PLT 131 (L) 10/31/2019 0245   PLT 134 (L) 07/12/2019 0909   MCV 106.6 (H) 10/31/2019 0245   MCV 102 (H) 07/12/2019 0909   MCH 35.4 (H) 10/31/2019 0245   MCHC 33.2 10/31/2019 0245   RDW 12.3 10/31/2019 0245   RDW 11.8 07/12/2019 0909   LYMPHSABS 1.2 12/26/2018 1342   MONOABS  0.5 12/26/2018 1342   EOSABS 0.1 12/26/2018 1342   BASOSABS 0.0 12/26/2018 1342    No results found for: POCLITH, LITHIUM   No results found for: PHENYTOIN, PHENOBARB, VALPROATE, CBMZ   .res Assessment: Plan:    Plan:   1. Continue  Buspar 4m BID  2. Continue Trintellix 279mdaily 3. Continue Trazadone 5057mt hs 4. Continue Deplin 30m64mily  RTC 4 weeks  Patient advised to contact office with any questions, adverse effects, or acute worsening in signs and symptoms.  StepJalal seen today for anxiety, depression and insomnia.  Diagnoses and all orders for this visit:  Bipolar II disorder (HCC)[place]omnia, unspecified type  Major depressive disorder, recurrent episode, moderate (HCC)[place] anxiety disorder    Please see After Visit Summary for patient specific instructions.  No future appointments.  No orders of the defined types were placed in this encounter.     -------------------------------

## 2019-11-14 DIAGNOSIS — M25562 Pain in left knee: Secondary | ICD-10-CM | POA: Diagnosis not present

## 2019-11-16 DIAGNOSIS — M25562 Pain in left knee: Secondary | ICD-10-CM | POA: Diagnosis not present

## 2019-11-20 ENCOUNTER — Other Ambulatory Visit: Payer: Self-pay | Admitting: Internal Medicine

## 2019-11-20 DIAGNOSIS — I10 Essential (primary) hypertension: Secondary | ICD-10-CM

## 2019-11-21 ENCOUNTER — Other Ambulatory Visit (HOSPITAL_COMMUNITY): Payer: Self-pay | Admitting: Orthopedic Surgery

## 2019-11-21 ENCOUNTER — Other Ambulatory Visit: Payer: Self-pay

## 2019-11-21 ENCOUNTER — Ambulatory Visit (HOSPITAL_COMMUNITY)
Admission: RE | Admit: 2019-11-21 | Discharge: 2019-11-21 | Disposition: A | Discharge status: Home / Self Care | Payer: PPO | Source: Ambulatory Visit | Attending: Cardiovascular Disease | Admitting: Cardiovascular Disease

## 2019-11-21 DIAGNOSIS — M79662 Pain in left lower leg: Secondary | ICD-10-CM | POA: Diagnosis not present

## 2019-11-21 DIAGNOSIS — M7989 Other specified soft tissue disorders: Secondary | ICD-10-CM

## 2019-11-21 DIAGNOSIS — M79661 Pain in right lower leg: Secondary | ICD-10-CM

## 2019-11-25 ENCOUNTER — Other Ambulatory Visit: Payer: Self-pay | Admitting: Internal Medicine

## 2019-11-25 DIAGNOSIS — I10 Essential (primary) hypertension: Secondary | ICD-10-CM

## 2019-11-27 DIAGNOSIS — M25562 Pain in left knee: Secondary | ICD-10-CM | POA: Diagnosis not present

## 2019-11-29 DIAGNOSIS — M25562 Pain in left knee: Secondary | ICD-10-CM | POA: Diagnosis not present

## 2019-12-03 DIAGNOSIS — M25562 Pain in left knee: Secondary | ICD-10-CM | POA: Diagnosis not present

## 2019-12-06 DIAGNOSIS — M25562 Pain in left knee: Secondary | ICD-10-CM | POA: Diagnosis not present

## 2019-12-10 DIAGNOSIS — M25562 Pain in left knee: Secondary | ICD-10-CM | POA: Diagnosis not present

## 2019-12-13 DIAGNOSIS — M25562 Pain in left knee: Secondary | ICD-10-CM | POA: Diagnosis not present

## 2019-12-14 DIAGNOSIS — Z471 Aftercare following joint replacement surgery: Secondary | ICD-10-CM | POA: Diagnosis not present

## 2019-12-14 DIAGNOSIS — Z96652 Presence of left artificial knee joint: Secondary | ICD-10-CM | POA: Diagnosis not present

## 2019-12-17 DIAGNOSIS — M25562 Pain in left knee: Secondary | ICD-10-CM | POA: Diagnosis not present

## 2019-12-18 ENCOUNTER — Encounter: Payer: Self-pay | Admitting: Adult Health

## 2019-12-18 ENCOUNTER — Ambulatory Visit (INDEPENDENT_AMBULATORY_CARE_PROVIDER_SITE_OTHER): Payer: PPO | Admitting: Adult Health

## 2019-12-18 ENCOUNTER — Other Ambulatory Visit: Payer: Self-pay

## 2019-12-18 DIAGNOSIS — F3181 Bipolar II disorder: Secondary | ICD-10-CM

## 2019-12-18 DIAGNOSIS — G4700 Insomnia, unspecified: Secondary | ICD-10-CM | POA: Diagnosis not present

## 2019-12-18 DIAGNOSIS — F411 Generalized anxiety disorder: Secondary | ICD-10-CM

## 2019-12-18 DIAGNOSIS — F331 Major depressive disorder, recurrent, moderate: Secondary | ICD-10-CM | POA: Diagnosis not present

## 2019-12-18 MED ORDER — BUSPIRONE HCL 15 MG PO TABS: ORAL_TABLET | ORAL | 1 refills | Status: DC

## 2019-12-18 MED ORDER — L-METHYLFOLATE 15 MG PO TABS: 15.0000 mg | ORAL_TABLET | Freq: Every day | ORAL | 1 refills | Status: DC

## 2019-12-18 MED ORDER — TRAZODONE HCL 50 MG PO TABS: ORAL_TABLET | ORAL | 1 refills | Status: DC

## 2019-12-18 MED ORDER — VORTIOXETINE HBR 20 MG PO TABS: 20.0000 mg | ORAL_TABLET | Freq: Every day | ORAL | 1 refills | Status: DC

## 2019-12-18 NOTE — Progress Notes (Signed)
Lee Lee [id number] 1951/08/31 69 y.o.  Subjective:   Patient ID:  Lee Lee is a 69 y.o. (DOB 01-Jun-1951) male.  Chief Complaint:  Chief Complaint  Patient presents with  . Anxiety  . Depression  . Insomnia  . Other    BPD 2    HPI   Lee Lee presents to the office today for follow-up of anxiety, insomnia, BPD2, and depression.  Describes mood today as "ok". Mood symptoms - denies anxiety, depression and irritability. Stating "things are going good right now". Planning to go to [place] to visit daughter. Then on to [place] for the next 3 months. Has recovered from knee surgery - 5 weeks ago. Hoping to play golf while on vacation. Wife doing well. Maintains sobriety. Stable interest and motivation. Taking medications as prescribed.  Energy levels stable. Active, does not have has a regular exercise routine with recent knee replacement. Released from P/T and is gradually returning to activities. Retired. Enjoys some usual interests. Married. Lives with wife. Talking with son ([place]) and daughter Dixie Dials).   Appetite decreased. Weight stable - 223 Sleeps well most nights. Averages 6 to 8 hours.  Focus and concentration stable. Completing tasks. Managing aspects of household.  Denies SI or HI. Denies AH or VH.  GAD-7     Office Visit from 04/28/2017 in [place]  Total GAD-7 Score  4    Mini-Mental     Clinical Support from 12/26/2018 in [place]  Total Score (max 30 points )  29    PHQ2-9     Clinical Support from 12/26/2018 in [place] Visit from 04/28/2017 in [place]  PHQ-2 Total Score  3  0  PHQ-9 Total Score  5  1       Review of Systems:  Review of Systems  Musculoskeletal: Negative for gait problem.  Neurological: Negative for tremors.  Psychiatric/Behavioral:       Please refer to HPI    Medications: I have  reviewed the patient's current medications.  Current Outpatient Medications  Medication Sig Dispense Refill  . azelastine (ASTELIN) 0.1 % nasal spray Place 1 spray into both nostrils 2 (two) times daily. Use in each nostril as directed 90 mL 1  . busPIRone (BUSPAR) 15 MG tablet Take one tablet twice daily. 180 tablet 1  . carvedilol (COREG) 12.5 MG tablet TAKE ONE TABLET BY MOUTH TWICE A DAY WITH A MEAL 60 tablet 10  . celecoxib (CELEBREX) 200 MG capsule Take 200 mg by mouth daily.    Marland Kitchen docusate sodium (COLACE) 100 MG capsule Take 1 capsule (100 mg total) by mouth 2 (two) times daily. 28 capsule 0  . ferrous sulfate (FERROUSUL) 325 (65 FE) MG tablet Take 1 tablet (325 mg total) by mouth 3 (three) times daily with meals for 14 days. 42 tablet 0  . FLUZONE HIGH-DOSE QUADRIVALENT 0.7 ML SUSY     . gabapentin (NEURONTIN) 300 MG capsule Take 300 mg by mouth 2 (two) times daily.     Marland Kitchen HAVRIX 1440 EL U/ML injection     . HYDROcodone-acetaminophen (NORCO) 7.5-325 MG tablet Take 1-2 tablets by mouth every 4 (four) hours as needed for moderate pain. 60 tablet 0  . L-Methylfolate 15 MG TABS Take 1 tablet (15 mg total) by mouth daily. 90 tablet 1  . levocetirizine (XYZAL) 5 MG tablet Take 1 tablet (5 mg total) by mouth every evening. [place]  tablet 1  . losartan-hydrochlorothiazide (HYZAAR) 100-12.5 MG tablet Take 1 tablet by mouth daily. 30 tablet 0  . lurasidone (LATUDA) 20 MG TABS tablet Take 1 tablet (20 mg total) by mouth daily. 30 tablet 1  . methocarbamol (ROBAXIN) 500 MG tablet Take 1 tablet (500 mg total) by mouth every 6 (six) hours as needed for muscle spasms. 40 tablet 0  . mupirocin ointment (BACTROBAN) 2 % Apply 1 application topically 2 (two) times daily. 30 g 2  . polyethylene glycol (MIRALAX / GLYCOLAX) 17 g packet Take 17 g by mouth 2 (two) times daily. 28 packet 0  . traZODone (DESYREL) 50 MG tablet Take one to two tablets at bedtime. 180 tablet 1  . vortioxetine HBr (TRINTELLIX) 20 MG TABS  tablet Take 1 tablet (20 mg total) by mouth daily. 90 tablet 1   No current facility-administered medications for this visit.    Medication Side Effects: None  Allergies: No Known Allergies  Past Medical History:  Diagnosis Date  . Alcohol abuse, episodic drinking behavior   . Anxiety   . Arthritis    back & knees  . Atrial flutter ([place])    s/p RFCA 01/05/13  . Calcium oxalate renal stones   . Complication of anesthesia    makes him loopy  . Depression   . ED (erectile dysfunction)   . Hemorrhoids   . Hypertension     Family History  Problem Relation Age of Onset  . Valvular heart disease Mother   . Lymphoma Mother     Social History   Socioeconomic History  . Marital status: Married    Spouse name: Not on file  . Number of children: Not on file  . Years of education: Not on file  . Highest education level: Not on file  Occupational History  . Occupation: Retired  Tobacco Use  . Smoking status: Former Smoker    Quit date: 01/06/1988    Years since quitting: 31.9  . Smokeless tobacco: Current User    Types: Chew  . Tobacco comment: chews tobacco when playing golf only  Substance and Sexual Activity  . Alcohol use: Not Currently    Alcohol/week: 5.0 standard drinks    Types: 5 Glasses of wine per week    Comment: wine daily  . Drug use: No  . Sexual activity: Yes  Other Topics Concern  . Not on file  Social History Narrative  . Not on file   Social Determinants of Health   Financial Resource Strain: Low Risk   . Difficulty of Paying Living Expenses: Not hard at all  Food Insecurity: No Food Insecurity  . Worried About Charity fundraiser in the Last Year: Never true  . Ran Out of Food in the Last Year: Never true  Transportation Needs: No Transportation Needs  . Lack of Transportation (Medical): No  . Lack of Transportation (Non-Medical): No  Physical Activity: Sufficiently Active  . Days of Exercise per Week: 5 days  . Minutes of Exercise per  Session: 50 min  Stress: No Stress Concern Present  . Feeling of Stress : Not at all  Social Connections: Unknown  . Frequency of Communication with Friends and Family: More than three times a week  . Frequency of Social Gatherings with Friends and Family: More than three times a week  . Attends Religious Services: Never  . Active Member of Clubs or Organizations: Not on file  . Attends Archivist Meetings: Never  . Marital  Status: Not on file  Intimate Partner Violence:   . Fear of Current or Ex-Partner: Not on file  . Emotionally Abused: Not on file  . Physically Abused: Not on file  . Sexually Abused: Not on file    Past Medical History, Surgical history, Social history, and Family history were reviewed and updated as appropriate.   Please see review of systems for further details on the patient's review from today.   Objective:   Physical Exam:  There were no vitals taken for this visit.  Physical Exam Constitutional:      General: He is not in acute distress.    Appearance: He is well-developed.  Musculoskeletal:        General: No deformity.  Neurological:     Mental Status: He is alert and oriented to person, place, and time.     Coordination: Coordination normal.  Psychiatric:        Attention and Perception: Attention and perception normal. He does not perceive auditory or visual hallucinations.        Mood and Affect: Mood normal. Mood is not anxious or depressed. Affect is not labile, blunt, angry or inappropriate.        Speech: Speech normal.        Behavior: Behavior normal.        Thought Content: Thought content normal. Thought content is not paranoid or delusional. Thought content does not include homicidal or suicidal ideation. Thought content does not include homicidal or suicidal plan.        Cognition and Memory: Cognition and memory normal.        Judgment: Judgment normal.     Comments: Insight intact     Lab Review:     Component  Value Date/Time   NA 134 (L) 10/31/2019 0245   NA 140 07/12/2019 0909   K 3.8 10/31/2019 0245   CL 100 10/31/2019 0245   CO2 25 10/31/2019 0245   GLUCOSE 194 (H) 10/31/2019 0245   BUN 15 10/31/2019 0245   BUN 12 07/12/2019 0909   CREATININE 0.75 10/31/2019 0245   CALCIUM 8.6 (L) 10/31/2019 0245   PROT 7.2 12/26/2018 1342   ALBUMIN 4.3 12/26/2018 1342   AST 40 (H) 12/26/2018 1342   ALT 28 12/26/2018 1342   ALKPHOS 62 12/26/2018 1342   BILITOT 0.6 12/26/2018 1342   GFRNONAA >60 10/31/2019 0245   GFRAA >60 10/31/2019 0245       Component Value Date/Time   WBC 11.7 (H) 10/31/2019 0245   RBC 3.02 (L) 10/31/2019 0245   HGB 10.7 (L) 10/31/2019 0245   HGB 13.5 07/12/2019 0909   HCT 32.2 (L) 10/31/2019 0245   HCT 38.7 07/12/2019 0909   PLT 131 (L) 10/31/2019 0245   PLT 134 (L) 07/12/2019 0909   MCV 106.6 (H) 10/31/2019 0245   MCV 102 (H) 07/12/2019 0909   MCH 35.4 (H) 10/31/2019 0245   MCHC 33.2 10/31/2019 0245   RDW 12.3 10/31/2019 0245   RDW 11.8 07/12/2019 0909   LYMPHSABS 1.2 12/26/2018 1342   MONOABS 0.5 12/26/2018 1342   EOSABS 0.1 12/26/2018 1342   BASOSABS 0.0 12/26/2018 1342    No results found for: POCLITH, LITHIUM   No results found for: PHENYTOIN, PHENOBARB, VALPROATE, CBMZ   .res Assessment: Plan:    Plan:   1. Continue Buspar 36m BID  2. Continue Trintellix 266mdaily 3. Continue Trazadone 5045mt hs 4. Continue Deplin 30m58mily  RTC 4 months  Patient advised  to contact office with any questions, adverse effects, or acute worsening in signs and symptoms.  [place] [phone].   Maclain was seen today for anxiety, depression, insomnia and other.  Diagnoses and all orders for this visit:  Bipolar II disorder ([place])  Generalized anxiety disorder -     vortioxetine HBr (TRINTELLIX) 20 MG TABS tablet; Take 1 tablet (20 mg total) by mouth daily. -     L-Methylfolate 15 MG TABS; Take 1 tablet (15 mg total) by mouth daily. -      busPIRone (BUSPAR) 15 MG tablet; Take one tablet twice daily.  Major depressive disorder, recurrent episode, moderate (HCC) -     vortioxetine HBr (TRINTELLIX) 20 MG TABS tablet; Take 1 tablet (20 mg total) by mouth daily. -     L-Methylfolate 15 MG TABS; Take 1 tablet (15 mg total) by mouth daily.  Insomnia, unspecified type -     traZODone (DESYREL) 50 MG tablet; Take one to two tablets at bedtime.     Please see After Visit Summary for patient specific instructions.  No future appointments.  No orders of the defined types were placed in this encounter.   -------------------------------

## 2019-12-24 ENCOUNTER — Encounter: Payer: Self-pay | Admitting: Internal Medicine

## 2019-12-24 ENCOUNTER — Ambulatory Visit (INDEPENDENT_AMBULATORY_CARE_PROVIDER_SITE_OTHER): Payer: PPO | Admitting: Internal Medicine

## 2019-12-24 ENCOUNTER — Other Ambulatory Visit: Payer: Self-pay | Admitting: Internal Medicine

## 2019-12-24 ENCOUNTER — Telehealth: Payer: Self-pay

## 2019-12-24 ENCOUNTER — Other Ambulatory Visit: Payer: Self-pay

## 2019-12-24 VITALS — BP 158/98 | HR 68 | Temp 98.8°F | Ht 71.0 in | Wt 233.0 lb

## 2019-12-24 DIAGNOSIS — E785 Hyperlipidemia, unspecified: Secondary | ICD-10-CM

## 2019-12-24 DIAGNOSIS — D539 Nutritional anemia, unspecified: Secondary | ICD-10-CM | POA: Insufficient documentation

## 2019-12-24 DIAGNOSIS — E781 Pure hyperglyceridemia: Secondary | ICD-10-CM | POA: Diagnosis not present

## 2019-12-24 DIAGNOSIS — I10 Essential (primary) hypertension: Secondary | ICD-10-CM

## 2019-12-24 DIAGNOSIS — R972 Elevated prostate specific antigen [PSA]: Secondary | ICD-10-CM | POA: Diagnosis not present

## 2019-12-24 DIAGNOSIS — N400 Enlarged prostate without lower urinary tract symptoms: Secondary | ICD-10-CM

## 2019-12-24 DIAGNOSIS — R0600 Dyspnea, unspecified: Secondary | ICD-10-CM

## 2019-12-24 DIAGNOSIS — I7121 Aneurysm of the ascending aorta, without rupture: Secondary | ICD-10-CM

## 2019-12-24 DIAGNOSIS — D508 Other iron deficiency anemias: Secondary | ICD-10-CM

## 2019-12-24 DIAGNOSIS — E538 Deficiency of other specified B group vitamins: Secondary | ICD-10-CM | POA: Insufficient documentation

## 2019-12-24 DIAGNOSIS — I712 Thoracic aortic aneurysm, without rupture, unspecified: Secondary | ICD-10-CM

## 2019-12-24 DIAGNOSIS — R739 Hyperglycemia, unspecified: Secondary | ICD-10-CM | POA: Diagnosis not present

## 2019-12-24 DIAGNOSIS — R0609 Other forms of dyspnea: Secondary | ICD-10-CM | POA: Insufficient documentation

## 2019-12-24 LAB — CBC WITH DIFFERENTIAL/PLATELET
Basophils Absolute: 0 10*3/uL (ref 0.0–0.1)
Basophils Relative: 0.4 % (ref 0.0–3.0)
Eosinophils Absolute: 0.1 10*3/uL (ref 0.0–0.7)
Eosinophils Relative: 0.7 % (ref 0.0–5.0)
HCT: 41.2 % (ref 39.0–52.0)
Hemoglobin: 14 g/dL (ref 13.0–17.0)
Lymphocytes Relative: 17.8 % (ref 12.0–46.0)
Lymphs Abs: 1.2 10*3/uL (ref 0.7–4.0)
MCHC: 34.1 g/dL (ref 30.0–36.0)
MCV: 106.1 fl — ABNORMAL HIGH (ref 78.0–100.0)
Monocytes Absolute: 0.5 10*3/uL (ref 0.1–1.0)
Monocytes Relative: 7.5 % (ref 3.0–12.0)
Neutro Abs: 5.1 10*3/uL (ref 1.4–7.7)
Neutrophils Relative %: 73.6 % (ref 43.0–77.0)
Platelets: 131 10*3/uL — ABNORMAL LOW (ref 150.0–400.0)
RBC: 3.88 Mil/uL — ABNORMAL LOW (ref 4.22–5.81)
RDW: 13.7 % (ref 11.5–15.5)
WBC: 7 10*3/uL (ref 4.0–10.5)

## 2019-12-24 LAB — LIPID PANEL
Cholesterol: 176 mg/dL (ref 0–200)
HDL: 89.3 mg/dL (ref >39.00)
LDL Cholesterol: 73 mg/dL (ref 0–99)
NonHDL: 86.55
Total CHOL/HDL Ratio: 2
Triglycerides: 70 mg/dL (ref 0.0–149.0)
VLDL: 14 mg/dL (ref 0.0–40.0)

## 2019-12-24 LAB — HEPATIC FUNCTION PANEL
ALT: 15 U/L (ref 0–53)
AST: 25 U/L (ref 0–37)
Albumin: 4.2 g/dL (ref 3.5–5.2)
Alkaline Phosphatase: 71 U/L (ref 39–117)
Bilirubin, Direct: 0.1 mg/dL (ref 0.0–0.3)
Total Bilirubin: 0.7 mg/dL (ref 0.2–1.2)
Total Protein: 7.4 g/dL (ref 6.0–8.3)

## 2019-12-24 LAB — VITAMIN D 25 HYDROXY (VIT D DEFICIENCY, FRACTURES): VITD: 55.35 ng/mL (ref 30.00–100.00)

## 2019-12-24 LAB — HEMOGLOBIN A1C: Hgb A1c MFr Bld: 4.4 % — ABNORMAL LOW (ref 4.6–6.5)

## 2019-12-24 LAB — TSH: TSH: 0.66 u[IU]/mL (ref 0.35–4.50)

## 2019-12-24 LAB — VITAMIN B12: Vitamin B-12: 211 pg/mL (ref 211–911)

## 2019-12-24 LAB — IBC PANEL
Iron: 230 ug/dL — ABNORMAL HIGH (ref 42–165)
Saturation Ratios: 77.1 % — ABNORMAL HIGH (ref 20.0–50.0)
Transferrin: 213 mg/dL (ref 212.0–360.0)

## 2019-12-24 LAB — TROPONIN I (HIGH SENSITIVITY): High Sens Troponin I: 7 ng/L (ref 2–17)

## 2019-12-24 LAB — BRAIN NATRIURETIC PEPTIDE: Pro B Natriuretic peptide (BNP): 88 pg/mL (ref 0.0–100.0)

## 2019-12-24 LAB — FERRITIN: Ferritin: 183.9 ng/mL (ref 22.0–322.0)

## 2019-12-24 LAB — FOLATE: Folate: >24.1 ng/mL (ref >5.9)

## 2019-12-24 MED ORDER — CYANOCOBALAMIN 2000 MCG PO TABS: 2000.0000 ug | ORAL_TABLET | Freq: Every day | ORAL | 1 refills | Status: DC

## 2019-12-24 MED ORDER — INDAPAMIDE 2.5 MG PO TABS: 2.5000 mg | ORAL_TABLET | Freq: Every day | ORAL | 0 refills | Status: DC

## 2019-12-24 MED ORDER — IRBESARTAN 300 MG PO TABS: 300.0000 mg | ORAL_TABLET | Freq: Every day | ORAL | 1 refills | Status: DC

## 2019-12-24 MED ORDER — CARVEDILOL 12.5 MG PO TABS: ORAL_TABLET | ORAL | 1 refills | Status: DC

## 2019-12-24 MED ORDER — HEPATITIS A VACCINE 1440 EL U/ML IM SUSP: 1.0000 mL | Freq: Once | INTRAMUSCULAR | 0 refills | Status: AC

## 2019-12-24 NOTE — Telephone Encounter (Signed)
Error

## 2019-12-24 NOTE — Progress Notes (Signed)
Subjective:  Patient ID: Lee Lee, male    DOB: 01/30/1951  Age: 69 y.o. MRN: [id number]  CC: Hypertension, Anemia, and Hyperlipidemia   This visit occurred during the SARS-CoV-2 public health emergency.  Safety protocols were in place, including screening questions prior to the visit, additional usage of staff PPE, and extensive cleaning of exam room while observing appropriate contact time as indicated for disinfecting solutions.    HPI Lee Lee presents for f/up - He complains of a 3 to 78-monthhistory of dyspnea on exertion.  In the last 6 months he has undergone an exercise treadmill test which was normal and an echocardiogram which was unremarkable.  He recently underwent some orthopedic surgery and was found to be anemic perioperatively.  He is not aware of any sources of blood loss.  Around the time of his surgery he says someone prescribed an iron supplement form.  He has been taking this and is tolerating it well.  Outpatient Medications Prior to Visit  Medication Sig Dispense Refill  . busPIRone (BUSPAR) 15 MG tablet Take one tablet twice daily. 180 tablet 1  . celecoxib (CELEBREX) 200 MG capsule Take 200 mg by mouth daily.    .Marland Kitchengabapentin (NEURONTIN) 300 MG capsule Take 300 mg by mouth 2 (two) times daily.     .Marland KitchenL-Methylfolate 15 MG TABS Take 1 tablet (15 mg total) by mouth daily. 90 tablet 1  . traZODone (DESYREL) 50 MG tablet Take one to two tablets at bedtime. 180 tablet 1  . vortioxetine HBr (TRINTELLIX) 20 MG TABS tablet Take 1 tablet (20 mg total) by mouth daily. 90 tablet 1  . carvedilol (COREG) 12.5 MG tablet TAKE ONE TABLET BY MOUTH TWICE A DAY WITH A MEAL 60 tablet 10  . losartan-hydrochlorothiazide (HYZAAR) 100-12.5 MG tablet Take 1 tablet by mouth daily. 30 tablet 0  . azelastine (ASTELIN) 0.1 % nasal spray Place 1 spray into both nostrils 2 (two) times daily. Use in each nostril as directed 90 mL 1  . docusate sodium (COLACE) 100 MG capsule Take 1  capsule (100 mg total) by mouth 2 (two) times daily. 28 capsule 0  . ferrous sulfate (FERROUSUL) 325 (65 FE) MG tablet Take 1 tablet (325 mg total) by mouth 3 (three) times daily with meals for 14 days. 42 tablet 0  . FLUZONE HIGH-DOSE QUADRIVALENT 0.7 ML SUSY     . HAVRIX 1440 EL U/ML injection     . HYDROcodone-acetaminophen (NORCO) 7.5-325 MG tablet Take 1-2 tablets by mouth every 4 (four) hours as needed for moderate pain. 60 tablet 0  . levocetirizine (XYZAL) 5 MG tablet Take 1 tablet (5 mg total) by mouth every evening. 90 tablet 1  . lurasidone (LATUDA) 20 MG TABS tablet Take 1 tablet (20 mg total) by mouth daily. 30 tablet 1  . methocarbamol (ROBAXIN) 500 MG tablet Take 1 tablet (500 mg total) by mouth every 6 (six) hours as needed for muscle spasms. 40 tablet 0  . mupirocin ointment (BACTROBAN) 2 % Apply 1 application topically 2 (two) times daily. 30 g 2  . polyethylene glycol (MIRALAX / GLYCOLAX) 17 g packet Take 17 g by mouth 2 (two) times daily. 28 packet 0   No facility-administered medications prior to visit.    ROS Review of Systems  Constitutional: Positive for unexpected weight change (wt gain). Negative for diaphoresis and fatigue.  HENT: Negative.   Eyes: Negative for visual disturbance.  Respiratory: Positive for shortness of breath. Negative  for cough, chest tightness and wheezing.   Cardiovascular: Negative for chest pain, palpitations and leg swelling.  Gastrointestinal: Negative for abdominal pain, anal bleeding, blood in stool, constipation, diarrhea, nausea and vomiting.  Endocrine: Negative.   Genitourinary: Negative for difficulty urinating and hematuria.  Musculoskeletal: Negative.  Negative for arthralgias and myalgias.  Skin: Negative for color change and pallor.  Neurological: Negative.  Negative for dizziness, weakness and headaches.  Hematological: Negative for adenopathy. Does not bruise/bleed easily.  Psychiatric/Behavioral: Negative.     Objective:   BP (!) 158/98 (BP Location: Left Arm, Patient Position: Sitting, Cuff Size: Large)   Pulse 68   Temp 98.8 F (37.1 C) (Oral)   Ht 5' 11"  (1.803 m)   Wt 233 lb (105.7 kg)   SpO2 98%   BMI 32.50 kg/m   BP Readings from Last 3 Encounters:  12/24/19 (!) 158/98  10/31/19 130/84  10/23/19 (!) 152/88    Wt Readings from Last 3 Encounters:  12/24/19 233 lb (105.7 kg)  10/23/19 229 lb 3 oz (104 kg)  08/10/19 228 lb (103.4 kg)    Physical Exam Vitals reviewed.  Constitutional:      Appearance: Normal appearance.  HENT:     Nose: Nose normal.     Mouth/Throat:     Mouth: Mucous membranes are moist.  Eyes:     General: No scleral icterus.    Conjunctiva/sclera: Conjunctivae normal.  Cardiovascular:     Rate and Rhythm: Normal rate and regular rhythm.     Heart sounds: No murmur.     Comments: EKG --  NSR, No LVH. No Q waves. No ST/T wave changes. Pulmonary:     Effort: Pulmonary effort is normal.     Breath sounds: No stridor. No wheezing, rhonchi or rales.  Abdominal:     General: Abdomen is protuberant. Bowel sounds are normal. There is no distension.     Palpations: Abdomen is soft. There is no hepatomegaly, splenomegaly or mass.     Tenderness: There is no abdominal tenderness.  Musculoskeletal:        General: Normal range of motion.     Cervical back: Neck supple.     Right lower leg: No edema.     Left lower leg: No edema.  Lymphadenopathy:     Cervical: No cervical adenopathy.  Skin:    General: Skin is warm and dry.     Coloration: Skin is not pale.  Neurological:     General: No focal deficit present.     Mental Status: He is alert.  Psychiatric:        Mood and Affect: Mood normal.        Behavior: Behavior normal.     Lab Results  Component Value Date   WBC 7.0 12/24/2019   HGB 14.0 12/24/2019   HCT 41.2 12/24/2019   PLT 131.0 (L) 12/24/2019   GLUCOSE 78 12/25/2019   CHOL 176 12/24/2019   TRIG 70.0 12/24/2019   HDL 89.30 12/24/2019    LDLDIRECT 101.0 12/26/2018   LDLCALC 73 12/24/2019   ALT 15 12/24/2019   AST 25 12/24/2019   NA 141 12/25/2019   K 4.1 12/25/2019   CL 103 12/25/2019   CREATININE 0.81 12/25/2019   BUN 14 12/25/2019   CO2 27 12/25/2019   TSH 0.66 12/24/2019   PSA 3.15 12/26/2018   INR 1.05 09/24/2012   HGBA1C 4.4 (L) 12/24/2019    VAS [place] LOWER EXTREMITY VENOUS (DVT)  Result Date: 11/21/2019  Lower Venous Study Indications: Left pain and swelling in calf x 3 days. Patient denies SOB or chest pain.  Risk Factors: Surgery Left TKR 10/30/2019. Anticoagulation: Baby aspirin x 1 day. Comparison Study: None Performing Technologist: Alecia Mackin RVT, RDCS (AE), RDMS  Examination Guidelines: A complete evaluation includes B-mode imaging, spectral Doppler, color Doppler, and power Doppler as needed of all accessible portions of each vessel. Bilateral testing is considered an integral part of a complete examination. Limited examinations for reoccurring indications may be performed as noted.  +-----+---------------+---------+-----------+----------+--------------+ RIGHTCompressibilityPhasicitySpontaneityPropertiesThrombus Aging +-----+---------------+---------+-----------+----------+--------------+ CFV  Full           Yes      Yes                                 +-----+---------------+---------+-----------+----------+--------------+   +---------+---------------+---------+-----------+----------+--------------+ LEFT     CompressibilityPhasicitySpontaneityPropertiesThrombus Aging +---------+---------------+---------+-----------+----------+--------------+ CFV      Full           Yes      Yes                                 +---------+---------------+---------+-----------+----------+--------------+ SFJ      Full           Yes      Yes                                 +---------+---------------+---------+-----------+----------+--------------+ FV Prox  Full           Yes      Yes                                  +---------+---------------+---------+-----------+----------+--------------+ FV Mid   Full           Yes      Yes                                 +---------+---------------+---------+-----------+----------+--------------+ FV DistalFull           Yes      Yes                                 +---------+---------------+---------+-----------+----------+--------------+ PFV      Full                                                        +---------+---------------+---------+-----------+----------+--------------+ POP      Full           Yes      Yes                                 +---------+---------------+---------+-----------+----------+--------------+ PTV      Full           Yes      Yes                                 +---------+---------------+---------+-----------+----------+--------------+  PERO     Full           Yes      Yes                                 +---------+---------------+---------+-----------+----------+--------------+ Gastroc  Full                                                        +---------+---------------+---------+-----------+----------+--------------+ GSV      Full           Yes      Yes                                 +---------+---------------+---------+-----------+----------+--------------+ SSV      Full           Yes      Yes                                 +---------+---------------+---------+-----------+----------+--------------+  Findings reported to Danae Orleans, PA at 10:40am.  Summary: Right: No evidence of common femoral vein obstruction. Left: No evidence of deep vein thrombosis in the lower extremity. No indirect evidence of obstruction proximal to the inguinal ligament. No cystic structure found in the popliteal fossa. Enlarged groin lymph node 3.1 x .7 x 2.1 cm. There is also skin thickening of the left posterior calf measuring 1.0cm compared to the right calf .6 cm.  *See table(s) above for  measurements and observations. Electronically signed by Quay Burow MD on 11/21/2019 at 2:44:17 PM.    Final     Assessment & Plan:   Ralf was seen today for hypertension, anemia and hyperlipidemia.  Diagnoses and all orders for this visit:  Ascending aortic aneurysm [facility])- He is due for a CT angio of the chest. -     CT Angio Chest W/Cm &/Or Wo Cm; Future  Essential hypertension- His blood pressure is not adequately well controlled and he is symptomatic.  I recommended that he upgrade to a more potent thiazide diuretic and a more potent ARB.  I recommended he continue the current dose of carvedilol. -     carvedilol (COREG) 12.5 MG tablet; TAKE ONE TABLET BY MOUTH TWICE A DAY WITH A MEAL -     VITAMIN D 25 Hydroxy (Vit-D Deficiency, Fractures) -     TSH -     Urinalysis, Routine w reflex microscopic -     indapamide (LOZOL) 2.5 MG tablet; Take 1 tablet (2.5 mg total) by mouth daily. -     irbesartan (AVAPRO) 300 MG tablet; Take 1 tablet (300 mg total) by mouth daily. -     Basic metabolic panel  Thoracic aortic aneurysm without rupture (HCC) -     CT Angio Chest W/Cm &/Or Wo Cm; Future  DOE (dyspnea on exertion)- The work-up for this is unremarkable.  He has gained weight so this is likely deconditioning.  I recommended he undergo a CT angio of the chest to see if it is related to the aortic aneurysm and to screen for lung pathology. -     D-dimer, quantitative (not at [facility]) -  Troponin I (High Sensitivity) -     Brain natriuretic peptide -     EKG 12-Lead  Deficiency anemia- His H&H are normal now.  His iron level is too high so I recommended that he stop taking the iron supplement.  His B12 level is borderline low so I recommended that he take a high-dose oral B12 supplement. -     IBC panel -     CBC with Differential/Platelet -     Vitamin B12 -     Ferritin -     Folate -     Vitamin B1  PSA elevation -     PSA, total and free  Benign prostatic hyperplasia  without lower urinary tract symptoms -     PSA, total and free  Hypertriglyceridemia -     Lipid panel  Hyperlipidemia with target LDL less than 130 - He has a mildly elevated ASCVD risk score so I assessed him to start taking a statin for CV risk reduction. -     Lipid panel -     Hepatic function panel -     TSH  Hyperglycemia- His blood sugars are normal now. -     Hemoglobin A1c -     Basic metabolic panel  Other iron deficiency anemia  B12 deficiency -     cyanocobalamin 2000 MCG tablet; Take 1 tablet (2,000 mcg total) by mouth daily.  Other orders -     hepatitis A virus, PF, vaccine (HAVRIX, PF,) 1440 EL U/ML injection; Inject 1 mL (1,440 Units total) into the muscle once for 1 dose.   I have discontinued Patrik Turnbaugh. Bobrowski's levocetirizine, azelastine, mupirocin ointment, Latuda, ferrous sulfate, docusate sodium, polyethylene glycol, methocarbamol, HYDROcodone-acetaminophen, carvedilol, losartan-hydrochlorothiazide, Havrix, and Fluzone High-Dose Quadrivalent. I am also having him start on hepatitis A virus (PF) vaccine and cyanocobalamin. Additionally, I am having him maintain his gabapentin, celecoxib, traZODone, vortioxetine HBr, L-Methylfolate, busPIRone, and rosuvastatin.  Meds ordered this encounter  Medications  . hepatitis A virus, PF, vaccine (HAVRIX, PF,) 1440 EL U/ML injection    Sig: Inject 1 mL (1,440 Units total) into the muscle once for 1 dose.    Dispense:  0.5 mL    Refill:  0  . DISCONTD: carvedilol (COREG) 12.5 MG tablet    Sig: TAKE ONE TABLET BY MOUTH TWICE A DAY WITH A MEAL    Dispense:  180 tablet    Refill:  1  . DISCONTD: indapamide (LOZOL) 2.5 MG tablet    Sig: Take 1 tablet (2.5 mg total) by mouth daily.    Dispense:  90 tablet    Refill:  0  . DISCONTD: irbesartan (AVAPRO) 300 MG tablet    Sig: Take 1 tablet (300 mg total) by mouth daily.    Dispense:  90 tablet    Refill:  1  . cyanocobalamin 2000 MCG tablet    Sig: Take 1 tablet (2,000  mcg total) by mouth daily.    Dispense:  90 tablet    Refill:  1  . DISCONTD: rosuvastatin (CRESTOR) 5 MG tablet    Sig: Take 1 tablet (5 mg total) by mouth daily.    Dispense:  90 tablet    Refill:  1  . rosuvastatin (CRESTOR) 5 MG tablet    Sig: Take 1 tablet (5 mg total) by mouth daily.    Dispense:  90 tablet    Refill:  1     Follow-up: Return in about 3 months (around 03/23/2020).  Scarlette Calico, MD

## 2019-12-24 NOTE — Patient Instructions (Signed)

## 2019-12-25 ENCOUNTER — Other Ambulatory Visit (INDEPENDENT_AMBULATORY_CARE_PROVIDER_SITE_OTHER): Payer: PPO

## 2019-12-25 DIAGNOSIS — I10 Essential (primary) hypertension: Secondary | ICD-10-CM | POA: Diagnosis not present

## 2019-12-25 DIAGNOSIS — R739 Hyperglycemia, unspecified: Secondary | ICD-10-CM

## 2019-12-25 LAB — BASIC METABOLIC PANEL
BUN: 14 mg/dL (ref 6–23)
CO2: 27 mEq/L (ref 19–32)
Calcium: 9.7 mg/dL (ref 8.4–10.5)
Chloride: 103 mEq/L (ref 96–112)
Creatinine, Ser: 0.81 mg/dL (ref 0.40–1.50)
GFR: 95.04 mL/min (ref >60.00)
Glucose, Bld: 78 mg/dL (ref 70–99)
Potassium: 4.1 mEq/L (ref 3.5–5.1)
Sodium: 141 mEq/L (ref 135–145)

## 2019-12-25 LAB — PSA: PSA: 3.6

## 2019-12-26 ENCOUNTER — Telehealth: Payer: Self-pay

## 2019-12-26 DIAGNOSIS — I10 Essential (primary) hypertension: Secondary | ICD-10-CM

## 2019-12-26 MED ORDER — INDAPAMIDE 2.5 MG PO TABS: 2.5000 mg | ORAL_TABLET | Freq: Every day | ORAL | 0 refills | Status: DC

## 2019-12-26 MED ORDER — CARVEDILOL 12.5 MG PO TABS: ORAL_TABLET | ORAL | 0 refills | Status: DC

## 2019-12-26 MED ORDER — ROSUVASTATIN CALCIUM 5 MG PO TABS: 5.0000 mg | ORAL_TABLET | Freq: Every day | ORAL | 1 refills | Status: DC

## 2019-12-26 MED ORDER — IRBESARTAN 300 MG PO TABS: 300.0000 mg | ORAL_TABLET | Freq: Every day | ORAL | 0 refills | Status: DC

## 2019-12-26 NOTE — Telephone Encounter (Signed)
Pt has picked up rx from the front.

## 2019-12-26 NOTE — Telephone Encounter (Signed)
Rx's printed for pcp to sign.

## 2019-12-26 NOTE — Telephone Encounter (Signed)
New message   The patient would like a copy of the medication - going out of town for a couple of months.

## 2019-12-26 NOTE — Telephone Encounter (Signed)
Pt informed rx's are ready for pick up.

## 2019-12-28 ENCOUNTER — Other Ambulatory Visit: Payer: Self-pay | Admitting: Internal Medicine

## 2019-12-28 ENCOUNTER — Encounter: Payer: Self-pay | Admitting: Internal Medicine

## 2019-12-28 ENCOUNTER — Other Ambulatory Visit: Payer: Self-pay

## 2019-12-28 ENCOUNTER — Ambulatory Visit (INDEPENDENT_AMBULATORY_CARE_PROVIDER_SITE_OTHER)
Admission: RE | Admit: 2019-12-28 | Discharge: 2019-12-28 | Disposition: A | Discharge status: Home / Self Care | Payer: PPO | Source: Ambulatory Visit | Attending: Internal Medicine | Admitting: Internal Medicine

## 2019-12-28 DIAGNOSIS — R0602 Shortness of breath: Secondary | ICD-10-CM | POA: Diagnosis not present

## 2019-12-28 DIAGNOSIS — E519 Thiamine deficiency, unspecified: Secondary | ICD-10-CM

## 2019-12-28 DIAGNOSIS — R0600 Dyspnea, unspecified: Secondary | ICD-10-CM

## 2019-12-28 DIAGNOSIS — R0609 Other forms of dyspnea: Secondary | ICD-10-CM

## 2019-12-28 DIAGNOSIS — R7989 Other specified abnormal findings of blood chemistry: Secondary | ICD-10-CM | POA: Diagnosis not present

## 2019-12-28 LAB — PSA, TOTAL AND FREE
PSA, % Free: 22 % (calc) — ABNORMAL LOW (ref >25)
PSA, Free: 0.8 ng/mL
PSA, Total: 3.6 ng/mL (ref <=4.0)

## 2019-12-28 LAB — VITAMIN B1: Vitamin B1 (Thiamine): 7 nmol/L — ABNORMAL LOW (ref 8–30)

## 2019-12-28 LAB — D-DIMER, QUANTITATIVE: D-Dimer, Quant: 4.87 mcg/mL FEU — ABNORMAL HIGH (ref <0.50)

## 2019-12-28 MED ORDER — THIAMINE HCL 100 MG PO TABS: 100.0000 mg | ORAL_TABLET | ORAL | 1 refills | Status: DC

## 2019-12-28 MED ORDER — IOHEXOL 350 MG/ML SOLN
80.0000 mL | Freq: Once | INTRAVENOUS | Status: AC | PRN
  Administered 2019-12-28: 80 mL via INTRAVENOUS

## 2019-12-28 NOTE — Addendum Note (Signed)
Addended by: Janith Lima on: 12/28/2019 04:16 PM   Modules accepted: Orders

## 2020-01-08 ENCOUNTER — Other Ambulatory Visit: Payer: Self-pay | Admitting: Adult Health

## 2020-04-07 ENCOUNTER — Other Ambulatory Visit: Payer: PPO

## 2020-05-14 DIAGNOSIS — F4323 Adjustment disorder with mixed anxiety and depressed mood: Secondary | ICD-10-CM | POA: Diagnosis not present

## 2020-05-26 ENCOUNTER — Other Ambulatory Visit: Payer: Self-pay

## 2020-05-26 ENCOUNTER — Ambulatory Visit (INDEPENDENT_AMBULATORY_CARE_PROVIDER_SITE_OTHER): Payer: PPO

## 2020-05-26 VITALS — BP 138/80 | HR 75 | Temp 98.0°F | Resp 16 | Ht 71.0 in | Wt 223.2 lb

## 2020-05-26 DIAGNOSIS — Z0000 Encounter for general adult medical examination without abnormal findings: Secondary | ICD-10-CM

## 2020-05-26 NOTE — Patient Instructions (Signed)
Lee Lee , Thank you for taking time to come for your Medicare Wellness Visit. I appreciate your ongoing commitment to your health goals. Please review the following plan we discussed and let me know if I can assist you in the future.   Screening recommendations/referrals: Colonoscopy: 04/13/2016; due every 10 years Recommended yearly ophthalmology/optometry visit for glaucoma screening and checkup Recommended yearly dental visit for hygiene and checkup  Vaccinations: Influenza vaccine: 07/19/2019 Pneumococcal vaccine: completed Tdap vaccine: 04/28/2017; due every 10 years Shingles vaccine: completed   Covid-19: completed  Advanced directives: Please bring a copy of your health care power of attorney and living will to the office at your convenience.   Conditions/risks identified: Please continue to do your personal lifestyle choices by: daily care of teeth and gums, regular physical activity (goal should be 5 days a week for 30 minutes), eat a healthy diet, avoid tobacco and drug use, limiting any alcohol intake, taking a low-dose aspirin (if not allergic or have been advised by your provider otherwise) and taking vitamins and minerals as recommended by your provider. Continue doing brain stimulating activities (puzzles, reading, adult coloring books, staying active) to keep memory sharp. Continue to eat heart healthy diet (full of fruits, vegetables, whole grains, lean protein, water--limit salt, fat, and sugar intake) and increase physical activity as tolerated.  Next appointment: Please schedule your next Medicare Wellness Visit with your Nurse Health Advisor in 1 year.  Preventive Care 67 Years and Older, Male Preventive care refers to lifestyle choices and visits with your health care provider that can promote health and wellness. What does preventive care include?  A yearly physical exam. This is also called an annual well check.  Dental exams once or twice a year.  Routine eye  exams. Ask your health care provider how often you should have your eyes checked.  Personal lifestyle choices, including:  Daily care of your teeth and gums.  Regular physical activity.  Eating a healthy diet.  Avoiding tobacco and drug use.  Limiting alcohol use.  Practicing safe sex.  Taking low doses of aspirin every day.  Taking vitamin and mineral supplements as recommended by your health care provider. What happens during an annual well check? The services and screenings done by your health care provider during your annual well check will depend on your age, overall health, lifestyle risk factors, and family history of disease. Counseling  Your health care provider may ask you questions about your:  Alcohol use.  Tobacco use.  Drug use.  Emotional well-being.  Home and relationship well-being.  Sexual activity.  Eating habits.  History of falls.  Memory and ability to understand (cognition).  Work and work Statistician. Screening  You may have the following tests or measurements:  Height, weight, and BMI.  Blood pressure.  Lipid and cholesterol levels. These may be checked every 5 years, or more frequently if you are over 24 years old.  Skin check.  Lung cancer screening. You may have this screening every year starting at age 27 if you have a 30-pack-year history of smoking and currently smoke or have quit within the past 15 years.  Fecal occult blood test (FOBT) of the stool. You may have this test every year starting at age 34.  Flexible sigmoidoscopy or colonoscopy. You may have a sigmoidoscopy every 5 years or a colonoscopy every 10 years starting at age 67.  Prostate cancer screening. Recommendations will vary depending on your family history and other risks.  Hepatitis C blood  test.  Hepatitis B blood test.  Sexually transmitted disease (STD) testing.  Diabetes screening. This is done by checking your blood sugar (glucose) after you have  not eaten for a while (fasting). You may have this done every 1-3 years.  Abdominal aortic aneurysm (AAA) screening. You may need this if you are a current or former smoker.  Osteoporosis. You may be screened starting at age 51 if you are at high risk. Talk with your health care provider about your test results, treatment options, and if necessary, the need for more tests. Vaccines  Your health care provider may recommend certain vaccines, such as:  Influenza vaccine. This is recommended every year.  Tetanus, diphtheria, and acellular pertussis (Tdap, Td) vaccine. You may need a Td booster every 10 years.  Zoster vaccine. You may need this after age 88.  Pneumococcal 13-valent conjugate (PCV13) vaccine. One dose is recommended after age 37.  Pneumococcal polysaccharide (PPSV23) vaccine. One dose is recommended after age 58. Talk to your health care provider about which screenings and vaccines you need and how often you need them. This information is not intended to replace advice given to you by your health care provider. Make sure you discuss any questions you have with your health care provider. Document Released: 12/12/2015 Document Revised: 08/04/2016 Document Reviewed: 09/16/2015 Elsevier Interactive Patient Education  2017 [place] Prevention in the Home Falls can cause injuries. They can happen to people of all ages. There are many things you can do to make your home safe and to help prevent falls. What can I do on the outside of my home?  Regularly fix the edges of walkways and driveways and fix any cracks.  Remove anything that might make you trip as you walk through a door, such as a raised step or threshold.  Trim any bushes or trees on the path to your home.  Use bright outdoor lighting.  Clear any walking paths of anything that might make someone trip, such as rocks or tools.  Regularly check to see if handrails are loose or broken. Make sure that both  sides of any steps have handrails.  Any raised decks and porches should have guardrails on the edges.  Have any leaves, snow, or ice cleared regularly.  Use sand or salt on walking paths during winter.  Clean up any spills in your garage right away. This includes oil or grease spills. What can I do in the bathroom?  Use night lights.  Install grab bars by the toilet and in the tub and shower. Do not use towel bars as grab bars.  Use non-skid mats or decals in the tub or shower.  If you need to sit down in the shower, use a plastic, non-slip stool.  Keep the floor dry. Clean up any water that spills on the floor as soon as it happens.  Remove soap buildup in the tub or shower regularly.  Attach bath mats securely with double-sided non-slip rug tape.  Do not have throw rugs and other things on the floor that can make you trip. What can I do in the bedroom?  Use night lights.  Make sure that you have a light by your bed that is easy to reach.  Do not use any sheets or blankets that are too big for your bed. They should not hang down onto the floor.  Have a firm chair that has side arms. You can use this for support while you get dressed.  Do not have throw rugs and other things on the floor that can make you trip. What can I do in the kitchen?  Clean up any spills right away.  Avoid walking on wet floors.  Keep items that you use a lot in easy-to-reach places.  If you need to reach something above you, use a strong step stool that has a grab bar.  Keep electrical cords out of the way.  Do not use floor polish or wax that makes floors slippery. If you must use wax, use non-skid floor wax.  Do not have throw rugs and other things on the floor that can make you trip. What can I do with my stairs?  Do not leave any items on the stairs.  Make sure that there are handrails on both sides of the stairs and use them. Fix handrails that are broken or loose. Make sure that  handrails are as long as the stairways.  Check any carpeting to make sure that it is firmly attached to the stairs. Fix any carpet that is loose or worn.  Avoid having throw rugs at the top or bottom of the stairs. If you do have throw rugs, attach them to the floor with carpet tape.  Make sure that you have a light switch at the top of the stairs and the bottom of the stairs. If you do not have them, ask someone to add them for you. What else can I do to help prevent falls?  Wear shoes that:  Do not have high heels.  Have rubber bottoms.  Are comfortable and fit you well.  Are closed at the toe. Do not wear sandals.  If you use a stepladder:  Make sure that it is fully opened. Do not climb a closed stepladder.  Make sure that both sides of the stepladder are locked into place.  Ask someone to hold it for you, if possible.  Clearly mark and make sure that you can see:  Any grab bars or handrails.  First and last steps.  Where the edge of each step is.  Use tools that help you move around (mobility aids) if they are needed. These include:  Canes.  Walkers.  Scooters.  Crutches.  Turn on the lights when you go into a dark area. Replace any light bulbs as soon as they burn out.  Set up your furniture so you have a clear path. Avoid moving your furniture around.  If any of your floors are uneven, fix them.  If there are any pets around you, be aware of where they are.  Review your medicines with your doctor. Some medicines can make you feel dizzy. This can increase your chance of falling. Ask your doctor what other things that you can do to help prevent falls. This information is not intended to replace advice given to you by your health care provider. Make sure you discuss any questions you have with your health care provider. Document Released: 09/11/2009 Document Revised: 04/22/2016 Document Reviewed: 12/20/2014 Elsevier Interactive Patient Education  2017  [employer].

## 2020-05-26 NOTE — Progress Notes (Signed)
Subjective:   Lee Lee is a 69 y.o. male who presents for Medicare Annual/Subsequent preventive examination.  Review of Systems    No ROS. Medicare Wellness Visit Cardiac Risk Factors include: advanced age (>60mn, >>59women);dyslipidemia;family history of premature cardiovascular disease;hypertension;male gender;obesity (BMI >30kg/m2);smoking/ tobacco exposure     Objective:    Today's Vitals   05/26/20 1120  BP: 138/80  Pulse: 75  Resp: 16  Temp: 98 F (36.7 C)  SpO2: 96%  Weight: 223 lb 3.2 oz (101.2 kg)  Height: 5' 11"  (1.803 m)  PainSc: 0-No pain   Body mass index is 31.13 kg/m.  Advanced Directives 05/26/2020 10/30/2019 10/23/2019 12/26/2018 09/21/2017 09/08/2017 07/04/2015  Does Patient Have a Medical Advance Directive? Yes Yes Yes Yes Yes No Yes  Type of Advance Directive - [place]Living will [place]Living will [place] and [place] will [place]Living will - [place]unta GordaLiving will  Does patient want to make changes to medical advance directive? No - Patient declined No - Patient declined No - Patient declined - - - No - Patient declined  Copy of [place] Chart? - No - copy requested No - copy requested No - copy requested - - No - copy requested  Would patient like information on creating a medical advance directive? - - - - No - Patient declined No - Patient declined -  Pre-existing out of facility DNR order (yellow form or pink MOST form) - - - - - - -    Current Medications (verified) Outpatient Encounter Medications as of 05/26/2020  Medication Sig  . busPIRone (BUSPAR) 15 MG tablet Take one tablet twice daily.  . carvedilol (COREG) 12.5 MG tablet TAKE ONE TABLET BY MOUTH TWICE A DAY WITH A MEAL  . cyanocobalamin 2000 MCG tablet Take 1 tablet (2,000 mcg total) by mouth daily.  .Marland KitchenHAVRIX 1440 EL U/ML injection   . irbesartan (AVAPRO) 300  MG tablet Take 1 tablet (300 mg total) by mouth daily.  .Marland KitchenL-Methylfolate 15 MG TABS Take 1 tablet (15 mg total) by mouth daily.  . traZODone (DESYREL) 50 MG tablet Take one to two tablets at bedtime.  . vortioxetine HBr (TRINTELLIX) 20 MG TABS tablet Take 1 tablet (20 mg total) by mouth daily.  . celecoxib (CELEBREX) 200 MG capsule Take 200 mg by mouth daily. (Patient not taking: Reported on 05/26/2020)  . gabapentin (NEURONTIN) 300 MG capsule TAKE ONE CAPSULE BY MOUTH TWICE A DAY (Patient not taking: Reported on 05/26/2020)  . indapamide (LOZOL) 2.5 MG tablet Take 1 tablet (2.5 mg total) by mouth daily. (Patient not taking: Reported on 05/26/2020)  . rosuvastatin (CRESTOR) 5 MG tablet Take 1 tablet (5 mg total) by mouth daily. (Patient not taking: Reported on 05/26/2020)  . thiamine 100 MG tablet Take 1 tablet (100 mg total) by mouth every other day. (Patient not taking: Reported on 05/26/2020)   No facility-administered encounter medications on file as of 05/26/2020.    Allergies (verified) Patient has no known allergies.   History: Past Medical History:  Diagnosis Date  . Alcohol abuse, episodic drinking behavior   . Anxiety   . Arthritis    back & knees  . [place]rial flutter [place]    s/p RFCA 01/05/13  . Calcium oxalate renal stones   . Complication of anesthesia    makes him loopy  . Depression   . ED (erectile dysfunction)   . Hemorrhoids   .  Hypertension    Past Surgical History:  Procedure Laterality Date  . ABLATION OF DYSRHYTHMIC FOCUS  01/05/2013  . ARTHROSCOPIC REPAIR ACL    . ATRIAL FLUTTER ABLATION N/A 01/05/2013   Procedure: ATRIAL FLUTTER ABLATION;  Surgeon: Evans Lance, MD;  Location: [facility] CATH LAB;  Service: Cardiovascular;  Laterality: N/A;  . COLONOSCOPY  11/2009   Dr. Benson Norway  . ELECTROPHYSIOLOGIC STUDY N/A 07/04/2015   Procedure: A-Flutter Ablation;  Surgeon: Evans Lance, MD;  Location: [facility] CV LAB;  Service: Cardiovascular;  Laterality: N/A;  . ROTATOR CUFF  REPAIR    . TOTAL KNEE ARTHROPLASTY Left 10/30/2019   Procedure: TOTAL KNEE ARTHROPLASTY;  Surgeon: Paralee Cancel, MD;  Location: [facility] ORS;  Service: Orthopedics;  Laterality: Left;  70 mins   Family History  Problem Relation Age of Onset  . Valvular heart disease Mother   . Lymphoma Mother    Social History   Socioeconomic History  . Marital status: Married    Spouse name: Not on file  . Number of children: Not on file  . Years of education: Not on file  . Highest education level: Not on file  Occupational History  . Occupation: Retired  Tobacco Use  . Smoking status: Former Smoker    Quit date: 01/06/1988    Years since quitting: 32.4  . Smokeless tobacco: Current User    Types: Chew  . Tobacco comment: chews tobacco when playing golf only  Vaping Use  . Vaping Use: Never used  Substance and Sexual Activity  . Alcohol use: Not Currently    Alcohol/week: 5.0 standard drinks    Types: 5 Glasses of wine per week    Comment: wine daily  . Drug use: No  . Sexual activity: Yes  Other Topics Concern  . Not on file  Social History Narrative  . Not on file   Social Determinants of Health   Financial Resource Strain:   . Difficulty of Paying Living Expenses:   Food Insecurity:   . Worried About Charity fundraiser in the Last Year:   . Arboriculturist in the Last Year:   Transportation Needs:   . Film/video editor (Medical):   Marland Kitchen Lack of Transportation (Non-Medical):   Physical Activity:   . Days of Exercise per Week:   . Minutes of Exercise per Session:   Stress:   . Feeling of Stress :   Social Connections:   . Frequency of Communication with Friends and Family:   . Frequency of Social Gatherings with Friends and Family:   . Attends Religious Services:   . Active Member of Clubs or Organizations:   . Attends Archivist Meetings:   Marland Kitchen Marital Status:     Tobacco Counseling Ready to quit: No Counseling given: No Comment: chews tobacco when playing  golf only   Clinical Intake:  Pre-visit preparation completed: Yes  Pain : No/denies pain Pain Score: 0-No pain     Nutritional Risks: None Diabetes: No  How often do you need to have someone help you when you read instructions, pamphlets, or other written materials from your doctor or pharmacy?: 1 - Never What is the last grade level you completed in school?: Master's coarses  Diabetic? No   Interpreter Needed?: No  Information entered by :: Jaelyn Cloninger N. Refugia Laneve, LPN   Activities of Daily Living In your present state of health, do you have any difficulty performing the following activities: 05/26/2020 10/30/2019  Hearing? Darreld Mclean  N  Vision? N N  Difficulty concentrating or making decisions? N N  Walking or climbing stairs? N Y  Dressing or bathing? N N  Doing errands, shopping? N N  Preparing Food and eating ? N -  Using the Toilet? N -  In the past six months, have you accidently leaked urine? N -  Do you have problems with loss of bowel control? N -  Managing your Medications? N -  Managing your Finances? N -  Housekeeping or managing your Housekeeping? N -  Some recent data might be hidden    Patient Care Team: Janith Lima, MD as PCP - General (Internal Medicine) Evans Lance, MD as PCP - Cardiology (Cardiology) Larey Dresser, MD as Referring Physician (Cardiology) Marybelle Killings, MD as Consulting Physician (Orthopedic Surgery) Evans Lance, MD as Consulting Physician (Cardiology)  Indicate any recent Medical Services you may have received from other than [facility] providers in the past year (date may be approximate).     Assessment:   This is a routine wellness examination for Hriday.  Hearing/Vision screen No exam data present  Dietary issues and exercise activities discussed: Current Exercise Habits: The patient does not participate in regular exercise at present, Exercise limited by: respiratory conditions(s)  Goals    . Patient Stated     I want  to lose weight by starting monitor my diet low fat and low carbohydrate foods.      Depression Screen PHQ 2/9 Scores 05/26/2020 12/24/2019 12/26/2018 04/28/2017  PHQ - 2 Score 3 0 3 0  PHQ- 9 Score 8 6 5 1     Fall Risk Fall Risk  05/26/2020 12/24/2019 12/26/2018 04/14/2018  Falls in the past year? 0 0 0 No  Number falls in past yr: 0 0 - -  Injury with Fall? 0 0 - -  Risk for fall due to : Orthopedic patient - - -  Follow up Falls evaluation completed;Education provided Falls evaluation completed - -    Any stairs in or around the home? Yes  If so, are there any without handrails? No  Home free of loose throw rugs in walkways, pet beds, electrical cords, etc? Yes  Adequate lighting in your home to reduce risk of falls? Yes   ASSISTIVE DEVICES UTILIZED TO PREVENT FALLS:  Life alert? No  Use of a cane, walker or w/c? No  Grab bars in the bathroom? No  Shower chair or bench in shower? No  Elevated toilet seat or a handicapped toilet? No   TIMED UP AND GO:  Was the test performed? No .  Length of time to ambulate 10 feet: 0 sec.   Gait steady and fast without use of assistive device  Cognitive Function: MMSE - Mini Mental State Exam 12/26/2018  Orientation to time 5  Orientation to Place 5  Registration 3  Attention/ Calculation 5  Recall 2  Language- name 2 objects 2  Language- repeat 1  Language- follow 3 step command 3  Language- read & follow direction 1  Write a sentence 1  Copy design 1  Total score 29        Immunizations Immunization History  Administered Date(s) Administered  . Hepatitis A, Adult 07/17/2019  . Influenza, High Dose Seasonal PF 07/27/2018, 07/17/2019  . Influenza,inj,Quad PF,6+ Mos 10/31/2017  . Influenza-Unspecified 08/14/2016, 07/28/2018, 07/19/2019  . Pneumococcal Conjugate-13 04/28/2017  . Pneumococcal Polysaccharide-23 12/26/2018  . Tdap 04/28/2017  . Zoster Recombinat (Shingrix) 08/23/2018, 10/10/2018  TDAP status: Up to  date Flu Vaccine status: Up to date Pneumococcal vaccine status: Up to date Covid-19 vaccine status: Completed vaccines  Qualifies for Shingles Vaccine? Yes   Zostavax completed Yes   Shingrix Completed?: Yes  Screening Tests Health Maintenance  Topic Date Due  . COVID-19 Vaccine (1) Never done  . INFLUENZA VACCINE  06/29/2020  . COLONOSCOPY  04/13/2026  . TETANUS/TDAP  04/29/2027  . Hepatitis C Screening  Completed  . PNA vac Low Risk Adult  Completed    Health Maintenance  Health Maintenance Due  Topic Date Due  . COVID-19 Vaccine (1) Never done    Colorectal cancer screening: Completed 04/13/2016. Repeat every 10 years  Lung Cancer Screening: (Low Dose CT Chest recommended if Age 106-80 years, 30 pack-year currently smoking OR have quit w/in 15years.) does not qualify.   Lung Cancer Screening Referral: no  Additional Screening:  Hepatitis C Screening: does qualify; Completed yes  Vision Screening: Recommended annual ophthalmology exams for early detection of glaucoma and other disorders of the eye. Is the patient up to date with their annual eye exam?  Yes  Who is the provider or what is the name of the office in which the patient attends annual eye exams? [facility] If pt is not established with a provider, would they like to be referred to a provider to establish care? No .   Dental Screening: Recommended annual dental exams for proper oral hygiene  Community Resource Referral / Chronic Care Management: CRR required this visit?  No   CCM required this visit?  No      Plan:     I have personally reviewed and noted the following in the patient's chart:   . Medical and social history . Use of alcohol, tobacco or illicit drugs  . Current medications and supplements . Functional ability and status . Nutritional status . Physical activity . Advanced directives . List of other physicians . Hospitalizations, surgeries, and ER visits in previous  12 months . Vitals . Screenings to include cognitive, depression, and falls . Referrals and appointments  In addition, I have reviewed and discussed with patient certain preventive protocols, quality metrics, and best practice recommendations. A written personalized care plan for preventive services as well as general preventive health recommendations were provided to patient.     Sheral Flow, LPN   [id number]   Nurse Notes:  Patient is cogitatively intact. Patient is on medication treatment for depression and seeing a psychiatrist.

## 2020-06-18 ENCOUNTER — Other Ambulatory Visit: Payer: Self-pay

## 2020-06-18 ENCOUNTER — Encounter: Payer: Self-pay | Admitting: Internal Medicine

## 2020-06-18 ENCOUNTER — Telehealth: Payer: Self-pay

## 2020-06-18 ENCOUNTER — Ambulatory Visit (INDEPENDENT_AMBULATORY_CARE_PROVIDER_SITE_OTHER): Payer: PPO | Admitting: Internal Medicine

## 2020-06-18 VITALS — BP 132/80 | HR 72 | Temp 98.9°F | Ht 71.0 in | Wt 220.0 lb

## 2020-06-18 DIAGNOSIS — F332 Major depressive disorder, recurrent severe without psychotic features: Secondary | ICD-10-CM | POA: Insufficient documentation

## 2020-06-18 DIAGNOSIS — R9431 Abnormal electrocardiogram [ECG] [EKG]: Secondary | ICD-10-CM | POA: Diagnosis not present

## 2020-06-18 DIAGNOSIS — H8303 Labyrinthitis, bilateral: Secondary | ICD-10-CM | POA: Diagnosis not present

## 2020-06-18 DIAGNOSIS — K7581 Nonalcoholic steatohepatitis (NASH): Secondary | ICD-10-CM | POA: Diagnosis not present

## 2020-06-18 DIAGNOSIS — R0600 Dyspnea, unspecified: Secondary | ICD-10-CM | POA: Diagnosis not present

## 2020-06-18 DIAGNOSIS — H6983 Other specified disorders of Eustachian tube, bilateral: Secondary | ICD-10-CM | POA: Diagnosis not present

## 2020-06-18 DIAGNOSIS — R4589 Other symptoms and signs involving emotional state: Secondary | ICD-10-CM

## 2020-06-18 DIAGNOSIS — I10 Essential (primary) hypertension: Secondary | ICD-10-CM | POA: Diagnosis not present

## 2020-06-18 DIAGNOSIS — H65193 Other acute nonsuppurative otitis media, bilateral: Secondary | ICD-10-CM

## 2020-06-18 DIAGNOSIS — E538 Deficiency of other specified B group vitamins: Secondary | ICD-10-CM | POA: Diagnosis not present

## 2020-06-18 DIAGNOSIS — J302 Other seasonal allergic rhinitis: Secondary | ICD-10-CM

## 2020-06-18 DIAGNOSIS — R270 Ataxia, unspecified: Secondary | ICD-10-CM

## 2020-06-18 DIAGNOSIS — F418 Other specified anxiety disorders: Secondary | ICD-10-CM | POA: Diagnosis not present

## 2020-06-18 DIAGNOSIS — R0609 Other forms of dyspnea: Secondary | ICD-10-CM

## 2020-06-18 DIAGNOSIS — H6993 Unspecified Eustachian tube disorder, bilateral: Secondary | ICD-10-CM

## 2020-06-18 LAB — TROPONIN I (HIGH SENSITIVITY): High Sens Troponin I: 10 ng/L (ref 2–17)

## 2020-06-18 MED ORDER — REXULTI 0.5 MG PO TABS: 1.0000 | ORAL_TABLET | Freq: Every day | ORAL | 0 refills | Status: DC

## 2020-06-18 MED ORDER — METHYLPREDNISOLONE ACETATE 80 MG/ML IJ SUSP
120.0000 mg | Freq: Once | INTRAMUSCULAR | Status: AC
  Administered 2020-06-18: 120 mg via INTRAMUSCULAR

## 2020-06-18 NOTE — Patient Instructions (Signed)
Labyrinthitis  Labyrinthitis is an infection of the inner ear. Your inner ear is made up of tubes and canals (labyrinth). These are filled with fluid. There are nerve cells in your inner ear that send hearing and balance signals to your brain. When germs get inside the tubes and canals, they harm the nerve cells that send signals to the brain. This condition is often caused by a virus. You may have:  Dizziness.  Ringing in the ears.  Loss of hearing.  Loss of balance.  Stomach upset.  Throwing up. Follow these instructions at home: Medicines   Take over-the-counter and prescription medicines only as told by your doctor.  If you were given an antibiotic medicine, take it as told by your doctor. Do not stop taking the antibiotic even if you start to feel better. Activity  Rest as told by your doctor.  Limit the things you do as told. Ask your doctor what is safe for you to do.  Do not make any sudden movements until you no longer feel dizzy.  Do physical therapy as told by your doctor. General instructions  Avoid loud noises and bright lights.  Do not drive until your doctor says that it is safe for you.  Drink enough fluid to keep your pee (urine) pale yellow.  Keep all follow-up visits as told by your doctor. This is important. Contact a doctor if:  Your symptoms do not get better.  You do not get better after two weeks.  You have a fever. Get help right away if:  You are very dizzy.  You keep throwing up.  You keep feeling sick to your stomach.  Your hearing gets much worse all of a sudden. Summary  Labyrinthitis is an infection of the inner ear.  This condition is often caused by a virus. Symptoms include dizziness, hearing loss, and ringing in the ears.  Treatment depends on the cause. Follow what your doctor tells you. This information is not intended to replace advice given to you by your health care provider. Make sure you discuss any questions  you have with your health care provider. Document Revised: 09/04/2018 Document Reviewed: 11/26/2017 Elsevier Patient Education  2020 [employer].
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
## 2020-06-18 NOTE — Telephone Encounter (Signed)
New message   The patient is asking for the sedative to help with his upcoming tests.   [facility] [place] [address] [place]

## 2020-06-19 ENCOUNTER — Ambulatory Visit: Payer: PPO | Admitting: Podiatry

## 2020-06-19 ENCOUNTER — Encounter: Payer: Self-pay | Admitting: Internal Medicine

## 2020-06-19 MED ORDER — DIAZEPAM 2 MG PO TABS: 2.0000 mg | ORAL_TABLET | Freq: Four times a day (QID) | ORAL | 0 refills | Status: DC | PRN

## 2020-06-20 ENCOUNTER — Encounter: Payer: Self-pay | Admitting: Internal Medicine

## 2020-06-20 ENCOUNTER — Ambulatory Visit
Admission: RE | Admit: 2020-06-20 | Discharge: 2020-06-20 | Disposition: A | Discharge status: Home / Self Care | Payer: PPO | Source: Ambulatory Visit | Attending: Internal Medicine | Admitting: Internal Medicine

## 2020-06-20 DIAGNOSIS — H6690 Otitis media, unspecified, unspecified ear: Secondary | ICD-10-CM | POA: Insufficient documentation

## 2020-06-20 DIAGNOSIS — R4589 Other symptoms and signs involving emotional state: Secondary | ICD-10-CM | POA: Insufficient documentation

## 2020-06-20 DIAGNOSIS — F418 Other specified anxiety disorders: Secondary | ICD-10-CM | POA: Insufficient documentation

## 2020-06-20 DIAGNOSIS — R270 Ataxia, unspecified: Secondary | ICD-10-CM

## 2020-06-20 DIAGNOSIS — G319 Degenerative disease of nervous system, unspecified: Secondary | ICD-10-CM | POA: Diagnosis not present

## 2020-06-20 DIAGNOSIS — S0990XA Unspecified injury of head, initial encounter: Secondary | ICD-10-CM | POA: Diagnosis not present

## 2020-06-20 DIAGNOSIS — J3489 Other specified disorders of nose and nasal sinuses: Secondary | ICD-10-CM | POA: Diagnosis not present

## 2020-06-20 DIAGNOSIS — M2548 Effusion, other site: Secondary | ICD-10-CM | POA: Diagnosis not present

## 2020-06-20 MED ORDER — GADOBENATE DIMEGLUMINE 529 MG/ML IV SOLN
20.0000 mL | Freq: Once | INTRAVENOUS | Status: AC | PRN
  Administered 2020-06-20: 20 mL via INTRAVENOUS

## 2020-06-20 MED ORDER — AMOXICILLIN-POT CLAVULANATE 875-125 MG PO TABS: 1.0000 | ORAL_TABLET | Freq: Two times a day (BID) | ORAL | 0 refills | Status: AC

## 2020-06-22 ENCOUNTER — Other Ambulatory Visit: Payer: Self-pay | Admitting: Internal Medicine

## 2020-06-22 DIAGNOSIS — E519 Thiamine deficiency, unspecified: Secondary | ICD-10-CM

## 2020-06-22 LAB — CBC WITH DIFFERENTIAL/PLATELET
Absolute Monocytes: 631 cells/uL (ref 200–950)
Basophils Absolute: 38 cells/uL (ref 0–200)
Basophils Relative: 0.5 %
Eosinophils Absolute: 91 cells/uL (ref 15–500)
Eosinophils Relative: 1.2 %
HCT: 40.4 % (ref 38.5–50.0)
Hemoglobin: 14.2 g/dL (ref 13.2–17.1)
Lymphs Abs: 1072 cells/uL (ref 850–3900)
MCH: 36.7 pg — ABNORMAL HIGH (ref 27.0–33.0)
MCHC: 35.1 g/dL (ref 32.0–36.0)
MCV: 104.4 fL — ABNORMAL HIGH (ref 80.0–100.0)
MPV: 9.4 fL (ref 7.5–12.5)
Monocytes Relative: 8.3 %
Neutro Abs: 5768 cells/uL (ref 1500–7800)
Neutrophils Relative %: 75.9 %
Platelets: 127 10*3/uL — ABNORMAL LOW (ref 140–400)
RBC: 3.87 10*6/uL — ABNORMAL LOW (ref 4.20–5.80)
RDW: 12.4 % (ref 11.0–15.0)
Total Lymphocyte: 14.1 %
WBC: 7.6 10*3/uL (ref 3.8–10.8)

## 2020-06-22 LAB — HEPATIC FUNCTION PANEL
AG Ratio: 1.4 (calc) (ref 1.0–2.5)
ALT: 29 U/L (ref 9–46)
AST: 36 U/L — ABNORMAL HIGH (ref 10–35)
Albumin: 4.3 g/dL (ref 3.6–5.1)
Alkaline phosphatase (APISO): 61 U/L (ref 35–144)
Bilirubin, Direct: 0.2 mg/dL (ref 0.0–0.2)
Globulin: 3.1 g/dL (calc) (ref 1.9–3.7)
Indirect Bilirubin: 0.6 mg/dL (calc) (ref 0.2–1.2)
Total Bilirubin: 0.8 mg/dL (ref 0.2–1.2)
Total Protein: 7.4 g/dL (ref 6.1–8.1)

## 2020-06-22 LAB — PROTIME-INR
INR: 1
Prothrombin Time: 10.8 s (ref 9.0–11.5)

## 2020-06-22 LAB — VITAMIN B12: Vitamin B-12: 810 pg/mL (ref 200–1100)

## 2020-06-22 LAB — FOLATE: Folate: >24 ng/mL

## 2020-06-22 LAB — BASIC METABOLIC PANEL WITH GFR
BUN: 17 mg/dL (ref 7–25)
CO2: 30 mmol/L (ref 20–32)
Calcium: 10 mg/dL (ref 8.6–10.3)
Chloride: 98 mmol/L (ref 98–110)
Creat: 0.93 mg/dL (ref 0.70–1.25)
GFR, Est African American: 98 mL/min/{1.73_m2} (ref >=60)
GFR, Est Non African American: 85 mL/min/{1.73_m2} (ref >=60)
Glucose, Bld: 116 mg/dL — ABNORMAL HIGH (ref 65–99)
Potassium: 3.7 mmol/L (ref 3.5–5.3)
Sodium: 140 mmol/L (ref 135–146)

## 2020-06-22 LAB — BRAIN NATRIURETIC PEPTIDE: Brain Natriuretic Peptide: 37 pg/mL (ref <100)

## 2020-06-22 LAB — VITAMIN B1: Vitamin B1 (Thiamine): <6 nmol/L — ABNORMAL LOW (ref 8–30)

## 2020-06-22 MED ORDER — THIAMINE HCL 100 MG PO TABS: 100.0000 mg | ORAL_TABLET | ORAL | 1 refills | Status: DC

## 2020-06-23 ENCOUNTER — Other Ambulatory Visit: Payer: Self-pay | Admitting: Internal Medicine

## 2020-06-23 DIAGNOSIS — H8303 Labyrinthitis, bilateral: Secondary | ICD-10-CM

## 2020-06-23 DIAGNOSIS — F332 Major depressive disorder, recurrent severe without psychotic features: Secondary | ICD-10-CM

## 2020-06-23 DIAGNOSIS — H65193 Other acute nonsuppurative otitis media, bilateral: Secondary | ICD-10-CM

## 2020-06-23 DIAGNOSIS — H6983 Other specified disorders of Eustachian tube, bilateral: Secondary | ICD-10-CM

## 2020-06-23 MED ORDER — ARIPIPRAZOLE 2 MG PO TABS: 2.0000 mg | ORAL_TABLET | Freq: Every day | ORAL | 0 refills | Status: DC

## 2020-06-24 DIAGNOSIS — F4322 Adjustment disorder with anxiety: Secondary | ICD-10-CM | POA: Diagnosis not present

## 2020-06-27 ENCOUNTER — Other Ambulatory Visit: Payer: Self-pay | Admitting: Adult Health

## 2020-06-27 ENCOUNTER — Other Ambulatory Visit: Payer: Self-pay | Admitting: Internal Medicine

## 2020-06-27 DIAGNOSIS — G4700 Insomnia, unspecified: Secondary | ICD-10-CM

## 2020-06-30 ENCOUNTER — Telehealth (HOSPITAL_COMMUNITY): Payer: Self-pay

## 2020-06-30 ENCOUNTER — Encounter (HOSPITAL_COMMUNITY): Payer: Self-pay

## 2020-06-30 NOTE — Telephone Encounter (Signed)
Detailed instructions left on the patient's answering machine. Asked to call back with any questions. S.Jadrien Narine EMTP 

## 2020-07-01 ENCOUNTER — Ambulatory Visit (HOSPITAL_COMMUNITY): Payer: PPO | Attending: Cardiovascular Disease

## 2020-07-01 ENCOUNTER — Other Ambulatory Visit: Payer: Self-pay

## 2020-07-01 DIAGNOSIS — R0609 Other forms of dyspnea: Secondary | ICD-10-CM

## 2020-07-01 DIAGNOSIS — R0600 Dyspnea, unspecified: Secondary | ICD-10-CM | POA: Diagnosis not present

## 2020-07-01 DIAGNOSIS — R9431 Abnormal electrocardiogram [ECG] [EKG]: Secondary | ICD-10-CM | POA: Diagnosis not present

## 2020-07-01 LAB — MYOCARDIAL PERFUSION IMAGING
LV dias vol: 131 mL (ref 62–150)
LV sys vol: 56 mL
Peak HR: 85 {beats}/min
Rest HR: 56 {beats}/min
SDS: 1
SRS: 0
SSS: 1
TID: 0.99

## 2020-07-01 MED ORDER — REGADENOSON 0.4 MG/5ML IV SOLN
0.4000 mg | Freq: Once | INTRAVENOUS | Status: AC
  Administered 2020-07-01: 0.4 mg via INTRAVENOUS

## 2020-07-01 MED ORDER — TECHNETIUM TC 99M TETROFOSMIN IV KIT
31.7000 | PACK | Freq: Once | INTRAVENOUS | Status: AC | PRN
  Administered 2020-07-01: 31.7 via INTRAVENOUS
  Filled 2020-07-01: qty 32

## 2020-07-01 MED ORDER — TECHNETIUM TC 99M TETROFOSMIN IV KIT
10.1000 | PACK | Freq: Once | INTRAVENOUS | Status: AC | PRN
  Administered 2020-07-01: 10.1 via INTRAVENOUS
  Filled 2020-07-01: qty 11

## 2020-07-19 ENCOUNTER — Other Ambulatory Visit: Payer: Self-pay | Admitting: Internal Medicine

## 2020-07-19 DIAGNOSIS — F332 Major depressive disorder, recurrent severe without psychotic features: Secondary | ICD-10-CM

## 2020-07-31 ENCOUNTER — Encounter: Payer: Self-pay | Admitting: Allergy

## 2020-07-31 ENCOUNTER — Telehealth: Payer: Self-pay | Admitting: Internal Medicine

## 2020-07-31 ENCOUNTER — Other Ambulatory Visit: Payer: Self-pay

## 2020-07-31 ENCOUNTER — Ambulatory Visit: Payer: PPO | Admitting: Allergy

## 2020-07-31 VITALS — BP 166/84 | HR 65 | Temp 98.3°F | Resp 18 | Ht 71.0 in | Wt 223.6 lb

## 2020-07-31 DIAGNOSIS — J3089 Other allergic rhinitis: Secondary | ICD-10-CM

## 2020-07-31 DIAGNOSIS — J3489 Other specified disorders of nose and nasal sinuses: Secondary | ICD-10-CM

## 2020-07-31 MED ORDER — AZELASTINE HCL 0.1 % NA SOLN
2.0000 | Freq: Two times a day (BID) | NASAL | 5 refills | Status: DC
Start: 2020-07-31 — End: 2020-12-11

## 2020-07-31 NOTE — Progress Notes (Signed)
°  Chronic Care Management   Outreach Note  07/31/2020 Name: Lee Lee MRN: [id number] DOB: 04-13-1951  Referred by: Janith Lima, MD Reason for referral : No chief complaint on file.   An unsuccessful telephone outreach was attempted today. The patient was referred to the pharmacist for assistance with care management and care coordination.   Follow Up Plan:   [name]

## 2020-07-31 NOTE — Patient Instructions (Addendum)
-  Environmental allergy skin prick testing is positive to dust mite and cat -Allergen avoidance measures discussed/handouts provided -For runny nose recommend use of nasal Astelin 2 sprays each nostril 1-2 times a day.  Recommend taking it definitely before bedtime to help morning symptoms. -For watery/itchy/red eyes can use over-the-counter Pataday 1 drop each eye daily as needed -If having generalized allergy symptoms like sneezing, itching can take an long-acting antihistamine like Zyrtec 10 mg, Xyzal 5 mg or Allegra 180 mg daily as needed. -If medication management and avoidance measures are not effective enough then consider course of allergen immunotherapy discussed today.  Follow-up in 4 to 6 months or sooner if needed

## 2020-07-31 NOTE — Progress Notes (Signed)
New Patient Note  RE: Lee Lee MRN: [id number] DOB: 1951/01/13 Date of Office Visit: 07/31/2020  Referring provider: Janith Lima, MD Primary care provider: Janith Lima, MD  Chief Complaint: allergies  History of present illness: Lee Lee is a 69 y.o. male presenting today for consultation for allergic rhinitis.    He reports symptoms of runny nose every morning for about an hour and then it stops.  Around the same time he has watery eyes.  This has been occurring for past 6 months.  He does state he was recommended at some point to try a nasal steroid spray which did not help.  He otherwise has not tried any other therapies. He states he had a middle ear infection and states was treated with antibiotic and steroid.  He states this was about 6 weeks or so ago and it has resolved. He denies getting sinus infections. He denies history of asthma, eczema or food allergy.  Review of systems: Review of Systems  Constitutional: Negative.   HENT:       See HPI  Eyes:       See HPI  Respiratory: Negative.   Cardiovascular: Negative.   Gastrointestinal: Negative.   Musculoskeletal: Negative.   Skin: Negative.   Neurological: Negative.     All other systems negative unless noted above in HPI  Past medical history: Past Medical History:  Diagnosis Date  . Alcohol abuse, episodic drinking behavior   . Anxiety   . Arthritis    back & knees  . Atrial flutter ([place])    s/p RFCA 01/05/13  . Calcium oxalate renal stones   . Complication of anesthesia    makes him loopy  . Depression   . ED (erectile dysfunction)   . Hemorrhoids   . Hypertension     Past surgical history: Past Surgical History:  Procedure Laterality Date  . ABLATION OF DYSRHYTHMIC FOCUS  01/05/2013  . ARTHROSCOPIC REPAIR ACL    . ATRIAL FLUTTER ABLATION N/A 01/05/2013   Procedure: ATRIAL FLUTTER ABLATION;  Surgeon: Evans Lance, MD;  Location: [facility] CATH LAB;  Service: Cardiovascular;   Laterality: N/A;  . COLONOSCOPY  11/2009   Dr. Benson Norway  . ELECTROPHYSIOLOGIC STUDY N/A 07/04/2015   Procedure: A-Flutter Ablation;  Surgeon: Evans Lance, MD;  Location: [facility] CV LAB;  Service: Cardiovascular;  Laterality: N/A;  . ROTATOR CUFF REPAIR    . TOTAL KNEE ARTHROPLASTY Left 10/30/2019   Procedure: TOTAL KNEE ARTHROPLASTY;  Surgeon: Paralee Cancel, MD;  Location: [facility] ORS;  Service: Orthopedics;  Laterality: Left;  70 mins    Family history:  Family History  Problem Relation Age of Onset  . Valvular heart disease Mother   . Lymphoma Mother     Social history: Lives in a home with carpeting in the bedroom with gas heating and central cooling.  1 dog in the home.  There is no concern for water damage, mildew or roaches in the home.  He is retired.  Avid golfer.  Tobacco Use  . Smoking status: Former Smoker    Quit date: 01/06/1988    Years since quitting: 32.5  . Smokeless tobacco: Current User    Types: Chew  . Tobacco comment: chews tobacco when playing golf only  Vaping Use  . Vaping Use: Never used     Medication List: Current Outpatient Medications  Medication Sig Dispense Refill  . ARIPiprazole (ABILIFY) 2 MG tablet TAKE ONE TABLET BY MOUTH DAILY  30 tablet 0  . busPIRone (BUSPAR) 15 MG tablet Take one tablet twice daily. 180 tablet 1  . carvedilol (COREG) 12.5 MG tablet TAKE ONE TABLET BY MOUTH TWICE A DAY WITH A MEAL 180 tablet 0  . cyanocobalamin 2000 MCG tablet Take 1 tablet (2,000 mcg total) by mouth daily. 90 tablet 1  . diazepam (VALIUM) 2 MG tablet Take 1 tablet (2 mg total) by mouth every 6 (six) hours as needed for anxiety. 10 tablet 0  . HAVRIX 1440 EL U/ML injection     . indapamide (LOZOL) 2.5 MG tablet TAKE ONE TABLET BY MOUTH DAILY 90 tablet 0  . irbesartan (AVAPRO) 300 MG tablet Take 300 mg by mouth daily.    Marland Kitchen L-Methylfolate 15 MG TABS Take 1 tablet (15 mg total) by mouth daily. 90 tablet 1  . rosuvastatin (CRESTOR) 5 MG tablet Take 1 tablet (5 mg  total) by mouth daily. 90 tablet 1  . thiamine 100 MG tablet Take 1 tablet (100 mg total) by mouth every other day. 45 tablet 1  . traZODone (DESYREL) 50 MG tablet TAKE 1 TO 2 TABLETS BY MOUTH AT BEDTIME 180 tablet 1  . vortioxetine HBr (TRINTELLIX) 20 MG TABS tablet Take 1 tablet (20 mg total) by mouth daily. 90 tablet 1   No current facility-administered medications for this visit.    Known medication allergies: No Known Allergies   Physical examination: Blood pressure (!) 166/84, pulse 65, temperature 98.3 F (36.8 C), temperature source Temporal, resp. rate 18, height 5' 11"  (1.803 m), weight 223 lb 9.6 oz (101.4 kg), SpO2 97 %.  General: Alert, interactive, in no acute distress. HEENT: PERRLA, TMs pearly gray, turbinates non-edematous with clear discharge, post-pharynx non erythematous. Neck: Supple without lymphadenopathy. Lungs: Clear to auscultation without wheezing, rhonchi or rales. {no increased work of breathing. CV: Normal S1, S2 without murmurs. Abdomen: Nondistended, nontender. Skin: Warm and dry, without lesions or rashes. Extremities:  No clubbing, cyanosis or edema. Neuro:   Grossly intact.  Diagnositics/Labs:  Allergy testing: environmental allergy skin prick testing is positive to cat. Intradermal testing is positive to mite mix.   Allergy testing results were read and interpreted by provider, documented by clinical staff.   Assessment and plan: Allergic rhinitis with significant nasal drainage  -Environmental allergy skin prick testing is positive to dust mite and cat -Allergen avoidance measures discussed/handouts provided -For runny nose recommend use of nasal Astelin 2 sprays each nostril 1-2 times a day.  Recommend taking it definitely before bedtime to help morning symptoms. -For watery/itchy/red eyes can use over-the-counter Pataday 1 drop each eye daily as needed -If having generalized allergy symptoms like sneezing, itching can take an long-acting  antihistamine like Zyrtec 10 mg, Xyzal 5 mg or Allegra 180 mg daily as needed. -If medication management and avoidance measures are not effective enough then consider course of allergen immunotherapy discussed today.  Follow-up in 4 to 6 months or sooner if needed  I appreciate the opportunity to take part in Connelly's care. Please do not hesitate to contact me with questions.  Sincerely,   Prudy Feeler, MD Allergy/Immunology [facility]

## 2020-08-01 ENCOUNTER — Telehealth: Payer: Self-pay | Admitting: Internal Medicine

## 2020-08-01 NOTE — Progress Notes (Signed)
  Chronic Care Management   Note  08/01/2020 Name: Lee Lee MRN: [id number] DOB: 13-May-1951  Lee Lee is a 68 y.o. year old male who is a primary care patient of Janith Lima, MD. I reached out to Dimple Nanas by phone today in response to a referral sent by [name], Janith Lima, MD.   [name] was given information about Chronic Care Management services today including:  1. CCM service includes personalized support from designated clinical staff supervised by his physician, including individualized plan of care and coordination with other care providers 2. 24/7 contact phone numbers for assistance for urgent and routine care needs. 3. Service will only be billed when office clinical staff spend 20 minutes or more in a month to coordinate care. 4. Only one practitioner may furnish and bill the service in a calendar month. 5. The patient may stop CCM services at any time (effective at the end of the month) by phone call to the office staff.   Patient agreed to services and verbal consent obtained.   Follow up plan:   [name]

## 2020-08-08 ENCOUNTER — Other Ambulatory Visit: Payer: Self-pay | Admitting: Sleep Medicine

## 2020-08-08 ENCOUNTER — Other Ambulatory Visit: Payer: PPO

## 2020-08-08 DIAGNOSIS — I471 Supraventricular tachycardia, unspecified: Secondary | ICD-10-CM

## 2020-08-11 LAB — NOVEL CORONAVIRUS, NAA: SARS-CoV-2, NAA: NOT DETECTED

## 2020-08-18 DIAGNOSIS — D485 Neoplasm of uncertain behavior of skin: Secondary | ICD-10-CM | POA: Diagnosis not present

## 2020-08-18 DIAGNOSIS — Z85828 Personal history of other malignant neoplasm of skin: Secondary | ICD-10-CM | POA: Diagnosis not present

## 2020-08-18 DIAGNOSIS — D225 Melanocytic nevi of trunk: Secondary | ICD-10-CM | POA: Diagnosis not present

## 2020-08-18 DIAGNOSIS — L821 Other seborrheic keratosis: Secondary | ICD-10-CM | POA: Diagnosis not present

## 2020-08-18 DIAGNOSIS — Z85820 Personal history of malignant melanoma of skin: Secondary | ICD-10-CM | POA: Diagnosis not present

## 2020-08-18 DIAGNOSIS — L738 Other specified follicular disorders: Secondary | ICD-10-CM | POA: Diagnosis not present

## 2020-08-18 DIAGNOSIS — L910 Hypertrophic scar: Secondary | ICD-10-CM | POA: Diagnosis not present

## 2020-08-18 DIAGNOSIS — L570 Actinic keratosis: Secondary | ICD-10-CM | POA: Diagnosis not present

## 2020-08-18 DIAGNOSIS — L814 Other melanin hyperpigmentation: Secondary | ICD-10-CM | POA: Diagnosis not present

## 2020-08-22 ENCOUNTER — Other Ambulatory Visit: Payer: Self-pay | Admitting: Internal Medicine

## 2020-08-22 DIAGNOSIS — F332 Major depressive disorder, recurrent severe without psychotic features: Secondary | ICD-10-CM

## 2020-09-25 ENCOUNTER — Other Ambulatory Visit: Payer: Self-pay | Admitting: Internal Medicine

## 2020-10-22 ENCOUNTER — Telehealth: Payer: PPO

## 2020-10-22 NOTE — Chronic Care Management (AMB) (Deleted)
Chronic Care Management Pharmacy  Name: Lee Lee  MRN: [id number] DOB: [birth year]   Chief Complaint/ HPI  Lee Lee,  69 y.o. , male presents for their Initial CCM visit with the clinical pharmacist via telephone due to COVID-19 Pandemic.  PCP : Janith Lima, MD Patient Care Team: Janith Lima, MD as PCP - General (Internal Medicine) Evans Lance, MD as PCP - Cardiology (Cardiology) Larey Dresser, MD as Referring Physician (Cardiology) Marybelle Killings, MD as Consulting Physician (Orthopedic Surgery) Evans Lance, MD as Consulting Physician (Cardiology) Charlton Haws, [facility] as Pharmacist (Pharmacist)  Their chronic conditions include: Hypertension, Hyperlipidemia, Depression, BPH and Allergic Rhinitis, Atrial flutter, Insomnia, ascending aortic aneurysm  Office Visits: 06/18/20 Dr Ronnald Ramp OV: chronic f/u. Pt c/o hearing loss/clogged ears after [place]. Also c/o SOB x 6 months, ataxia, depression. Ataxia related to middle ear effusions, tx sinusitis with Augmentin and steroids. Rec'd myocardial perfusion study for SOB. Rx's Rexulti for depression. Referred to allergy.  06/19/20 - changed Rexulti to Abilify. Gave diazepam for upcoming tests (MRI, MPI).   Consult Visit: 07/31/20 Dr Nelva Bush (allergy): OV for allergic rhinitis, rx's Astelin nasal spray 06/24/20 Dr Lynda Rainwater (psych): f/u for adjustment disorder w/ anxiety  No Known Allergies  Medications: Outpatient Encounter Medications as of 10/22/2020  Medication Sig  . ARIPiprazole (ABILIFY) 2 MG tablet TAKE ONE TABLET BY MOUTH DAILY  . azelastine (ASTELIN) 0.1 % nasal spray Place 2 sprays into both nostrils 2 (two) times daily.  . busPIRone (BUSPAR) 15 MG tablet Take one tablet twice daily.  . carvedilol (COREG) 12.5 MG tablet TAKE ONE TABLET BY MOUTH TWICE A DAY WITH A MEAL  . cyanocobalamin 2000 MCG tablet Take 1 tablet (2,000 mcg total) by mouth daily.  . diazepam (VALIUM) 2  MG tablet Take 1 tablet (2 mg total) by mouth every 6 (six) hours as needed for anxiety.  Marland Kitchen HAVRIX 1440 EL U/ML injection   . indapamide (LOZOL) 2.5 MG tablet TAKE ONE TABLET BY MOUTH DAILY  . irbesartan (AVAPRO) 300 MG tablet TAKE ONE TABLET BY MOUTH DAILY  . L-Methylfolate 15 MG TABS Take 1 tablet (15 mg total) by mouth daily.  . rosuvastatin (CRESTOR) 5 MG tablet Take 1 tablet (5 mg total) by mouth daily.  Marland Kitchen thiamine 100 MG tablet Take 1 tablet (100 mg total) by mouth every other day.  . traZODone (DESYREL) 50 MG tablet TAKE 1 TO 2 TABLETS BY MOUTH AT BEDTIME  . vortioxetine HBr (TRINTELLIX) 20 MG TABS tablet Take 1 tablet (20 mg total) by mouth daily.  . [DISCONTINUED] ARIPiprazole (ABILIFY) 2 MG tablet Take 1 tablet (2 mg total) by mouth daily.  . [DISCONTINUED] ARIPiprazole (ABILIFY) 2 MG tablet TAKE ONE TABLET BY MOUTH DAILY   No facility-administered encounter medications on file as of 10/22/2020.    Wt Readings from Last 3 Encounters:  07/31/20 223 lb 9.6 oz (101.4 kg)  07/01/20 220 lb (99.8 kg)  06/18/20 220 lb (99.8 kg)    Current Diagnosis/Assessment:    Goals Addressed   None     Hypertension / Atrial flutter   BP goal is:  <130/80  Office blood pressures are  BP Readings from Last 3 Encounters:  07/31/20 (!) 166/84  06/18/20 132/80  05/26/20 138/80   Lab Results  Component Value Date   CREATININE 0.93 06/18/2020   BUN 17 06/18/2020   GFR 95.04 12/25/2019   GFRNONAA 85 06/18/2020  GFRAA 98 06/18/2020   NA 140 06/18/2020   K 3.7 06/18/2020   CALCIUM 10.0 06/18/2020   CO2 30 06/18/2020   Patient checks BP at home {CHL HP BP Monitoring [phone]} Patient home BP readings are ranging: ***  Patient has failed these meds in the past: Edarbyclor, losartan, HCTZ, metoprolol succinate,  Patient is currently {CHL Controlled/Uncontrolled:9808215621} on the following medications:  . Carvedilol 12.5 mg BID - not on dispense report . Indapamide 2.5  mg daily . Irbesartan 300 mg daily  We discussed {CHL HP Upstream Pharmacy [phone]}  Plan  Continue {CHL HP Upstream Pharmacy Plans:[phone]}   Hyperlipidemia   LDL goal < 100  Last lipids Lab Results  Component Value Date   CHOL 176 12/24/2019   HDL 89.30 12/24/2019   LDLCALC 73 12/24/2019   LDLDIRECT 101.0 12/26/2018   TRIG 70.0 12/24/2019   CHOLHDL 2 12/24/2019   Hepatic Function Latest Ref Rng & Units 06/18/2020 12/24/2019 12/26/2018  Total Protein 6.1 - 8.1 g/dL 7.4 7.4 7.2  Albumin 3.5 - 5.2 g/dL - 4.2 4.3  AST 10 - 35 U/L 36(H) 25 40(H)  ALT 9 - 46 U/L _0 Alk Phosphatase 39 - 117 U/L - 71 62  Total Bilirubin 0.2 - 1.2 mg/dL 0.8 0.7 0.6  Bilirubin, Direct 0.0 - 0.2 mg/dL 0.2 0.1 -    The 10-year ASCVD risk score Mikey Bussing DC Jr., et al., 2013) is: 18%   Values used to calculate the score:     Age: 46 years     Sex: Male     Is Non-Hispanic African American: No     Diabetic: No     Tobacco smoker: No     Systolic Blood Pressure: 680 mmHg     Is BP treated: Yes     HDL Cholesterol: 89.3 mg/dL     Total Cholesterol: 176 mg/dL   Patient has failed these meds in past: *** Patient is currently {CHL Controlled/Uncontrolled:9808215621} on the following medications:  . Rosuvastatin 5 mg daily - not on dispense report  We discussed:  {CHL HP Upstream Pharmacy [phone]}  Plan  Continue {CHL HP Upstream Pharmacy SUPJS:[id number]}  Depression / Anxiety/ Insomnia   Depression screen [facility] 2/9 05/26/2020 12/24/2019 12/26/2018  Decreased Interest 0 0 1  Down, Depressed, Hopeless 3 0 2  PHQ - 2 Score 3 0 3  Altered sleeping 1 3 0  Tired, decreased energy 1 0 0  Change in appetite _1 Feeling bad or failure about yourself  1 0 1  Trouble concentrating 0 0 0  Moving slowly or fidgety/restless 0 0 0  Suicidal thoughts 1 0 0  PHQ-9 Score _2 Difficult doing work/chores Somewhat difficult Not difficult at all Not difficult at all    GAD 7 : Generalized Anxiety Score 04/28/2017  Nervous, Anxious, on Edge 1  Control/stop worrying 0  Worry too much - different things 1  Trouble relaxing 1  Restless 0  Easily annoyed or irritable 0  Afraid - awful might happen 1  Total GAD 7 Score 4    Patient has failed these meds in past: alprazolam, citalopram, Latuda, mirtazapine, paroxetine, zolpidem Patient is currently {CHL Controlled/Uncontrolled:9808215621} on the following medications:  . Trintellix 20 mg daily . Aripiprazole 2 mg daily . Buspirone 15 mg BID  - not on dispense report . Diazepam 2 mg q6h PRN . Trazodone 50 mg - 1-2 tab HS  We discussed:  ***  Plan  Continue {CHL HP Upstream Pharmacy [phone]}  Allergic rhinitis   Patient has failed these meds in past: *** Patient is currently {CHL Controlled/[phone]} on the following medications:  . Azelastine 0.1% nasal spray PRN  We discussed:  ***  Plan  Continue {CHL HP Upstream Pharmacy [phone]}  Health Maintenance   Patient is currently {CHL Controlled/[phone]} on the following medications:  Marland Kitchen Vitamin B12 2000 mcg daily . Thiamine 100 mg QOD . L-methylfolate 15 mg daily  We discussed:  ***  Plan  Continue {CHL HP Upstream Pharmacy FFKVQ:[id number]}  Vaccines   Reviewed and discussed patient's vaccination history.    Immunization History  Administered Date(s) Administered  . Hepatitis A, Adult 07/17/2019  . Influenza, High Dose Seasonal PF 07/27/2018, 07/17/2019  . Influenza,inj,Quad PF,6+ Mos 10/31/2017  . Influenza-Unspecified 08/14/2016, 07/28/2018, 07/19/2019  . Moderna SARS-COVID-2 Vaccination 01/15/2020, 02/14/2020  . Pneumococcal Conjugate-13 04/28/2017  . Pneumococcal Polysaccharide-23 12/26/2018  . Tdap 04/28/2017  . Zoster Recombinat (Shingrix) 08/23/2018, 10/10/2018    Plan  Recommended patient receive *** vaccine in *** office.    Medication Management   Patient's  preferred pharmacy is:  [facility] [address] [place] [address] Phone: [phone] Fax: [phone]  [facility] [phone] [facility] [place], [place] - [address] [place] [address] [place] [address] Phone: [phone] Fax: [phone]  Uses pill box? {Yes or If no, why not?:20788} Pt endorses ***% compliance  We discussed: {Pharmacy options:24294}  Plan  {US Pharmacy XPFR:33125}    Follow up: *** month phone visit  ***

## 2020-11-20 DIAGNOSIS — Z03818 Encounter for observation for suspected exposure to other biological agents ruled out: Secondary | ICD-10-CM | POA: Diagnosis not present

## 2020-11-23 ENCOUNTER — Other Ambulatory Visit: Payer: Self-pay | Admitting: Internal Medicine

## 2020-11-23 DIAGNOSIS — F332 Major depressive disorder, recurrent severe without psychotic features: Secondary | ICD-10-CM

## 2020-12-10 NOTE — Progress Notes (Signed)
Cardiology Office Note Date:  12/11/2020  Patient ID:  Lee Lee, Lee Lee Jul 20, 1951, MRN [id number] PCP:  Janith Lima, MD  Cardiologist/Electrophysiologist: Dr. [name]    Chief Complaint: over due annual visit  History of Present Illness: RITO LECOMTE is a 70 y.o. male with history of Aflutter/SVT (was found to have a parahisian atrial tachycardia which was successfully ablated 2014), HTN, ascending aortic aneurysm.  He comes in today to be seen for Dr. [name], last seen by him 2018, at that time had som palpitations that did not sound of his SVT/ AFlutter, wondered about development of AF.  If found would need a/c.  He last saw EP with A. Vandiver, [place], 07/2019, mentions resolved CP, though continued exercise intolerances, hx of borderline sleep study.  Discussed perhaps PPI if recurrent symptoms, asked to monitor BP, discussed amlodipine with borderline bradycardia, planned to RTC 1 year.   TODAY He is doing "OK". golfs regularly 3-4x week.  Mentions that for the last 14mooccasionally he feels like he gets winded unusually easily.  He does not have to stop, but slows his waking pace.  He discussed with his PMD who sent him for stress test that was normal/low risk.  His DOE is sometimes associated with an ache type awareness in his chest, he says "not pain", no nausea or other symptoms. No rest symptoms, though will sometimes feel SOB when bending to tie his shoes. About 2 mo ago he started a gold program geared towards cardiac wellness that is at a faster walking pace and with intention to improve cardiac conditioning and has not noted his symptoms with the increased pace.  No dizzy spells, near syncope or syncope.Never feels palpitations and does not think he has had any kind of arrhythmia.  Monitors his BP occasionally at home, about the same as today, 130s/80s, denies eating much in the way of salty foods.   Past Medical History:  Diagnosis Date  . Alcohol abuse,  episodic drinking behavior   . Anxiety   . Arthritis    back & knees  . [place]    s/p RFCA 01/05/13  . Calcium oxalate renal stones   . Complication of anesthesia    makes him loopy  . Depression   . ED (erectile dysfunction)   . Hemorrhoids   . Hypertension     Past Surgical History:  Procedure Laterality Date  . ABLATION OF DYSRHYTHMIC FOCUS  01/05/2013  . ARTHROSCOPIC REPAIR ACL    . ATRIAL FLUTTER ABLATION N/A 01/05/2013   Procedure: ATRIAL FLUTTER ABLATION;  Surgeon: GEvans Lance MD;  [facility];  Service: Cardiovascular;  Laterality: N/A;  . COLONOSCOPY  11/2009   Dr. HBenson Norway . ELECTROPHYSIOLOGIC STUDY N/A 07/04/2015   Procedure: A-Flutter Ablation;  Surgeon: GEvans Lance MD;  [facility];  Service: Cardiovascular;  Laterality: N/A;  . ROTATOR CUFF REPAIR    . TOTAL KNEE ARTHROPLASTY Left 10/30/2019   Procedure: TOTAL KNEE ARTHROPLASTY;  Surgeon: OParalee Cancel MD;  [facility];  Service: Orthopedics;  Laterality: Left;  70 mins    Current Outpatient Medications  Medication Sig Dispense Refill  . ARIPiprazole (ABILIFY) 2 MG tablet TAKE ONE TABLET BY MOUTH DAILY 90 tablet 0  . busPIRone (BUSPAR) 15 MG tablet Take one tablet twice daily. 180 tablet 1  . cyanocobalamin 2000 MCG tablet Take 1 tablet (2,000 mcg total) by mouth daily. 90 tablet 1  . desvenlafaxine (PRISTIQ) 50  MG 24 hr tablet Take 50 mg by mouth daily.    . diazepam (VALIUM) 2 MG tablet Take 1 tablet (2 mg total) by mouth every 6 (six) hours as needed for anxiety. 10 tablet 0  . indapamide (LOZOL) 2.5 MG tablet TAKE ONE TABLET BY MOUTH DAILY 90 tablet 0  . L-Methylfolate 15 MG TABS Take 1 tablet (15 mg total) by mouth daily. 90 tablet 1  . traZODone (DESYREL) 50 MG tablet TAKE 1 TO 2 TABLETS BY MOUTH AT BEDTIME 180 tablet 1  . carvedilol (COREG) 12.5 MG tablet TAKE ONE TABLET BY MOUTH TWICE A DAY WITH A MEAL 180 tablet 2  . HAVRIX 1440 EL U/ML injection     .  irbesartan (AVAPRO) 300 MG tablet Take 1 tablet (300 mg total) by mouth daily. 90 tablet 2   No current facility-administered medications for this visit.    Allergies:   Patient has no known allergies.   Social History:  The patient  reports that he quit smoking about 32 years ago. His smokeless tobacco use includes chew. He reports previous alcohol use of about 5.0 standard drinks of alcohol per week. He reports that he does not use drugs.   Family History:  The patient's family history includes Lymphoma in his mother; Valvular heart disease in his mother.  ROS:  Please see the history of present illness.    All other systems are reviewed and otherwise negative.   PHYSICAL EXAM:  VS:  BP 136/88   Pulse 66   Ht 5' 11"  (1.803 m)   Wt 233 lb 6.4 oz (105.9 kg)   SpO2 99%   BMI 32.55 kg/m  BMI: Body mass index is 32.55 kg/m. Well nourished, well developed, in no acute distress HEENT: normocephalic, atraumatic Neck: no JVD, carotid bruits or masses Cardiac:  RRR; no significant murmurs, no rubs, or gallops Lungs:  CTA b/l, no wheezing, rhonchi or rales Abd: soft, nontender MS: no deformity or atrophy Ext: no edema Skin: warm and dry, no rash Neuro:  No gross deficits appreciated Psych: euthymic mood, full affect   EKG:  Done today and reviewed by myself shows  SR 66bpm, unchanegd  07/01/2020: stress myoview Nuclear stress EF: 57%.  There was no ST segment deviation noted during stress.  The study is normal.  This is a low risk study.  The left ventricular ejection fraction is normal (55-65%).   12/28/2019: CT chest IMPRESSION: 1. No evidence of acute pulmonary embolism or other acute chest process. 2. 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 Recommend annual imaging followup by CTA or MRA. This recommendation follows 2010 ACCF/AHA/AATS/ACR/ASA/SCA/SCAI/SIR/STS/SVM Guidelines for the Diagnosis and Management of  Patients with Thoracic Aortic Disease. Circulation. 2010; 121: X726-O035 3. Severe hepatic steatosis.   07/17/2019: TTE IMPRESSIONS  1. The left ventricle has normal systolic function with an ejection  fraction of 60-65%. The cavity size was normal. Left ventricular diastolic  Doppler parameters are consistent with pseudonormalization.  2. The right ventricle has normal systolic function. The cavity was  normal. There is no increase in right ventricular wall thickness.  3. Left atrial size was mildly dilated.  4. There is mild mitral annular calcification present.  5. There is mild dilatation of the ascending aorta measuring 44 mm.     Recent Labs: 12/24/2019: Pro B Natriuretic peptide (BNP) 88.0; TSH 0.66 06/18/2020: ALT 29; Brain Natriuretic Peptide 37; BUN 17; Creat 0.93; Hemoglobin 14.2; Platelets  127; Potassium 3.7; Sodium 140  12/24/2019: Cholesterol 176; HDL 89.30; LDL Cholesterol 73; Total CHOL/HDL Ratio 2; Triglycerides 70.0; VLDL 14.0   CrCl cannot be calculated (Patient's most recent lab result is older than the maximum 21 days allowed.).   Wt Readings from Last 3 Encounters:  12/11/20 233 lb 6.4 oz (105.9 kg)  07/31/20 223 lb 9.6 oz (101.4 kg)  07/01/20 220 lb (99.8 kg)     Other studies reviewed: Additional studies/records reviewed today include: summarized above  ASSESSMENT AND PLAN:  1. SVT/AFlutter ablated remotely     No palpitations or symptoms     He does not think his occasional DOE is rhythm related, and prefers to hold off on monitoring  2. HTN     Asked to monitor, continue exercise and weight loss efforts  3. DOE/SOB     Stress test is reassuring, though his symptoms persist, are not escalating     He is due for CT for aorta surveillance, will add coronary evaluation as well     Continue exercise and conditioning efforts     He leaves to [place] in 2 weeks for the next 46mo will ry to get CT done before he leaves            Disposition: F/u  with uKoreain 612[place]er if needed.  Current medicines are reviewed at length with the patient today.  The patient did not have any concerns regarding medicines.  SiVenetia NightPA-C 12/11/2020 9:15 AM     [facility] [address] [phone])  [phone])

## 2020-12-11 ENCOUNTER — Encounter: Payer: Self-pay | Admitting: Physician Assistant

## 2020-12-11 ENCOUNTER — Ambulatory Visit: Payer: PPO | Admitting: Physician Assistant

## 2020-12-11 ENCOUNTER — Other Ambulatory Visit: Payer: Self-pay

## 2020-12-11 VITALS — BP 136/88 | HR 66 | Ht 71.0 in | Wt 233.4 lb

## 2020-12-11 DIAGNOSIS — I10 Essential (primary) hypertension: Secondary | ICD-10-CM

## 2020-12-11 DIAGNOSIS — R079 Chest pain, unspecified: Secondary | ICD-10-CM

## 2020-12-11 DIAGNOSIS — I471 Supraventricular tachycardia: Secondary | ICD-10-CM | POA: Diagnosis not present

## 2020-12-11 DIAGNOSIS — I712 Thoracic aortic aneurysm, without rupture, unspecified: Secondary | ICD-10-CM

## 2020-12-11 DIAGNOSIS — R0602 Shortness of breath: Secondary | ICD-10-CM

## 2020-12-11 MED ORDER — IRBESARTAN 300 MG PO TABS: 300.0000 mg | ORAL_TABLET | Freq: Every day | ORAL | 2 refills | Status: DC

## 2020-12-11 MED ORDER — CARVEDILOL 12.5 MG PO TABS: ORAL_TABLET | ORAL | 2 refills | Status: DC

## 2020-12-11 NOTE — Addendum Note (Signed)
Addended by: Claude Manges on: 12/11/2020 10:01 AM   Modules accepted: Orders

## 2020-12-11 NOTE — Patient Instructions (Addendum)
Medication Instructions:   Your physician recommends that you continue on your current medications as directed. Please refer to the Current Medication list given to you today.  *If you need a refill on your cardiac medications before your next appointment, please call your pharmacy*   Lab Work:  BMET TODAY   If you have labs (blood work) drawn today and your tests are completely normal, you will receive your results only by: Marland Kitchen MyChart Message (if you have MyChart) OR . A paper copy in the mail If you have any lab test that is abnormal or we need to change your treatment, we will call you to review the results.   Testing/Procedures: Non-Cardiac CT Angiography (CTA), is a special type of CT scan that uses a computer to produce multi-dimensional views of major blood vessels throughout the body. In CT angiography, a contrast material is injected through an IV to help visualize the blood vessels   Follow-Up: At [facility], you and your health needs are our priority.  As part of our continuing mission to provide you with exceptional heart care, we have created designated Provider Care Teams.  These Care Teams include your primary Cardiologist (physician) and Advanced Practice Providers (APPs -  Physician Assistants and Nurse Practitioners) who all work together to provide you with the care you need, when you need it.  We recommend signing up for the patient portal called "MyChart".  Sign up information is provided on this After Visit Summary.  MyChart is used to connect with patients for Virtual Visits (Telemedicine).  Patients are able to view lab/test results, encounter notes, upcoming appointments, etc.  Non-urgent messages can be sent to your provider as well.   To learn more about what you can do with MyChart, go to [email or web address].    Your next appointment:   6 month(s)  The format for your next appointment:   In Person  Provider:   Tommye Standard, PA-C   Other  Instructions

## 2020-12-12 LAB — BASIC METABOLIC PANEL
BUN/Creatinine Ratio: 18 (ref 10–24)
BUN: 14 mg/dL (ref 8–27)
CO2: 23 mmol/L (ref 20–29)
Calcium: 9.7 mg/dL (ref 8.6–10.2)
Chloride: 100 mmol/L (ref 96–106)
Creatinine, Ser: 0.8 mg/dL (ref 0.76–1.27)
GFR calc Af Amer: 106 mL/min/{1.73_m2} (ref >59)
GFR calc non Af Amer: 92 mL/min/{1.73_m2} (ref >59)
Glucose: 98 mg/dL (ref 65–99)
Potassium: 4.4 mmol/L (ref 3.5–5.2)
Sodium: 144 mmol/L (ref 134–144)

## 2020-12-16 ENCOUNTER — Other Ambulatory Visit: Payer: Self-pay | Admitting: Internal Medicine

## 2020-12-16 DIAGNOSIS — J9811 Atelectasis: Secondary | ICD-10-CM

## 2020-12-16 DIAGNOSIS — I712 Thoracic aortic aneurysm, without rupture, unspecified: Secondary | ICD-10-CM

## 2020-12-16 DIAGNOSIS — I7121 Aneurysm of the ascending aorta, without rupture: Secondary | ICD-10-CM

## 2020-12-17 ENCOUNTER — Ambulatory Visit (INDEPENDENT_AMBULATORY_CARE_PROVIDER_SITE_OTHER)
Admission: RE | Admit: 2020-12-17 | Discharge: 2020-12-17 | Disposition: A | Discharge status: Home / Self Care | Payer: PPO | Source: Ambulatory Visit | Attending: Physician Assistant | Admitting: Physician Assistant

## 2020-12-17 ENCOUNTER — Other Ambulatory Visit: Payer: Self-pay

## 2020-12-17 DIAGNOSIS — I712 Thoracic aortic aneurysm, without rupture, unspecified: Secondary | ICD-10-CM

## 2020-12-17 DIAGNOSIS — I700 Atherosclerosis of aorta: Secondary | ICD-10-CM | POA: Diagnosis not present

## 2020-12-17 MED ORDER — IOHEXOL 350 MG/ML SOLN
100.0000 mL | Freq: Once | INTRAVENOUS | Status: AC | PRN
  Administered 2020-12-17: 100 mL via INTRAVENOUS

## 2020-12-24 ENCOUNTER — Other Ambulatory Visit: Payer: Self-pay | Admitting: Adult Health

## 2020-12-24 DIAGNOSIS — G4700 Insomnia, unspecified: Secondary | ICD-10-CM

## 2020-12-25 ENCOUNTER — Ambulatory Visit (INDEPENDENT_AMBULATORY_CARE_PROVIDER_SITE_OTHER): Payer: PPO | Admitting: Internal Medicine

## 2020-12-25 ENCOUNTER — Encounter: Payer: Self-pay | Admitting: Internal Medicine

## 2020-12-25 ENCOUNTER — Other Ambulatory Visit: Payer: Self-pay

## 2020-12-25 VITALS — BP 152/100 | HR 63 | Temp 98.3°F | Resp 16 | Ht 71.0 in | Wt 238.0 lb

## 2020-12-25 DIAGNOSIS — F332 Major depressive disorder, recurrent severe without psychotic features: Secondary | ICD-10-CM | POA: Diagnosis not present

## 2020-12-25 DIAGNOSIS — Z0000 Encounter for general adult medical examination without abnormal findings: Secondary | ICD-10-CM | POA: Diagnosis not present

## 2020-12-25 DIAGNOSIS — K7581 Nonalcoholic steatohepatitis (NASH): Secondary | ICD-10-CM

## 2020-12-25 DIAGNOSIS — R972 Elevated prostate specific antigen [PSA]: Secondary | ICD-10-CM

## 2020-12-25 DIAGNOSIS — I10 Essential (primary) hypertension: Secondary | ICD-10-CM

## 2020-12-25 DIAGNOSIS — G2581 Restless legs syndrome: Secondary | ICD-10-CM

## 2020-12-25 DIAGNOSIS — E519 Thiamine deficiency, unspecified: Secondary | ICD-10-CM | POA: Diagnosis not present

## 2020-12-25 DIAGNOSIS — E538 Deficiency of other specified B group vitamins: Secondary | ICD-10-CM | POA: Diagnosis not present

## 2020-12-25 DIAGNOSIS — E785 Hyperlipidemia, unspecified: Secondary | ICD-10-CM | POA: Diagnosis not present

## 2020-12-25 DIAGNOSIS — Z0001 Encounter for general adult medical examination with abnormal findings: Secondary | ICD-10-CM

## 2020-12-25 LAB — CBC WITH DIFFERENTIAL/PLATELET
Basophils Absolute: 0 10*3/uL (ref 0.0–0.1)
Basophils Relative: 0.3 % (ref 0.0–3.0)
Eosinophils Absolute: 0.1 10*3/uL (ref 0.0–0.7)
Eosinophils Relative: 0.9 % (ref 0.0–5.0)
HCT: 41.1 % (ref 39.0–52.0)
Hemoglobin: 14.3 g/dL (ref 13.0–17.0)
Lymphocytes Relative: 16.5 % (ref 12.0–46.0)
Lymphs Abs: 1 10*3/uL (ref 0.7–4.0)
MCHC: 34.8 g/dL (ref 30.0–36.0)
MCV: 106.3 fl — ABNORMAL HIGH (ref 78.0–100.0)
Monocytes Absolute: 0.5 10*3/uL (ref 0.1–1.0)
Monocytes Relative: 8 % (ref 3.0–12.0)
Neutro Abs: 4.6 10*3/uL (ref 1.4–7.7)
Neutrophils Relative %: 74.3 % (ref 43.0–77.0)
Platelets: 126 10*3/uL — ABNORMAL LOW (ref 150.0–400.0)
RBC: 3.87 Mil/uL — ABNORMAL LOW (ref 4.22–5.81)
RDW: 13.8 % (ref 11.5–15.5)
WBC: 6.2 10*3/uL (ref 4.0–10.5)

## 2020-12-25 LAB — HEPATIC FUNCTION PANEL
ALT: 37 U/L (ref 0–53)
AST: 47 U/L — ABNORMAL HIGH (ref 0–37)
Albumin: 4 g/dL (ref 3.5–5.2)
Alkaline Phosphatase: 63 U/L (ref 39–117)
Bilirubin, Direct: 0.2 mg/dL (ref 0.0–0.3)
Total Bilirubin: 0.9 mg/dL (ref 0.2–1.2)
Total Protein: 7 g/dL (ref 6.0–8.3)

## 2020-12-25 LAB — URINALYSIS, ROUTINE W REFLEX MICROSCOPIC
Bilirubin Urine: NEGATIVE
Ketones, ur: NEGATIVE
Leukocytes,Ua: NEGATIVE
Nitrite: NEGATIVE
Specific Gravity, Urine: 1.02 (ref 1.000–1.030)
Total Protein, Urine: NEGATIVE
Urine Glucose: NEGATIVE
Urobilinogen, UA: 0.2 (ref 0.0–1.0)
pH: 6.5 (ref 5.0–8.0)

## 2020-12-25 LAB — LIPID PANEL
Cholesterol: 179 mg/dL (ref 0–200)
HDL: 69.4 mg/dL (ref >39.00)
LDL Cholesterol: 90 mg/dL (ref 0–99)
NonHDL: 110.02
Total CHOL/HDL Ratio: 3
Triglycerides: 100 mg/dL (ref 0.0–149.0)
VLDL: 20 mg/dL (ref 0.0–40.0)

## 2020-12-25 LAB — FOLATE: Folate: 19.3 ng/mL (ref >5.9)

## 2020-12-25 LAB — BASIC METABOLIC PANEL
BUN: 10 mg/dL (ref 6–23)
CO2: 30 mEq/L (ref 19–32)
Calcium: 9.3 mg/dL (ref 8.4–10.5)
Chloride: 101 mEq/L (ref 96–112)
Creatinine, Ser: 0.8 mg/dL (ref 0.40–1.50)
GFR: 91.23 mL/min (ref >60.00)
Glucose, Bld: 106 mg/dL — ABNORMAL HIGH (ref 70–99)
Potassium: 4.3 mEq/L (ref 3.5–5.1)
Sodium: 138 mEq/L (ref 135–145)

## 2020-12-25 LAB — TSH: TSH: 1.56 u[IU]/mL (ref 0.35–4.50)

## 2020-12-25 LAB — PSA: PSA: 3.32 ng/mL (ref 0.10–4.00)

## 2020-12-25 LAB — VITAMIN B12: Vitamin B-12: 595 pg/mL (ref 211–911)

## 2020-12-25 LAB — PROTIME-INR
INR: 1.1 ratio — ABNORMAL HIGH (ref 0.8–1.0)
Prothrombin Time: 12.3 s (ref 9.6–13.1)

## 2020-12-25 MED ORDER — GABAPENTIN 600 MG PO TABS: 600.0000 mg | ORAL_TABLET | Freq: Every day | ORAL | 1 refills | Status: DC

## 2020-12-25 MED ORDER — INDAPAMIDE 2.5 MG PO TABS: 2.5000 mg | ORAL_TABLET | Freq: Every day | ORAL | 0 refills | Status: DC

## 2020-12-25 MED ORDER — DESVENLAFAXINE SUCCINATE ER 50 MG PO TB24: 50.0000 mg | ORAL_TABLET | Freq: Every day | ORAL | 1 refills | Status: DC

## 2020-12-25 NOTE — Patient Instructions (Signed)

## 2020-12-25 NOTE — Progress Notes (Signed)
Subjective:  Patient ID: Lee Lee, male    DOB: 08/19/1951  Age: 70 y.o. MRN: [id number]  CC: Annual Exam, Hypertension, and Hyperlipidemia  This visit occurred during the SARS-CoV-2 public health emergency.  Safety protocols were in place, including screening questions prior to the visit, additional usage of staff PPE, and extensive cleaning of exam room while observing appropriate contact time as indicated for disinfecting solutions.    HPI Lee Lee presents for a CPX.  He tells me that some days his blood pressure is well controlled and other days it is not well controlled.  He tells me he is compliant with his antihypertensives.  He recently saw cardiology.  He is active and denies any recent episodes of chest pain, shortness of breath, palpitations, edema, or fatigue.  Outpatient Medications Prior to Visit  Medication Sig Dispense Refill  . ARIPiprazole (ABILIFY) 2 MG tablet TAKE ONE TABLET BY MOUTH DAILY 90 tablet 0  . busPIRone (BUSPAR) 15 MG tablet Take one tablet twice daily. 180 tablet 1  . carvedilol (COREG) 12.5 MG tablet TAKE ONE TABLET BY MOUTH TWICE A DAY WITH A MEAL 180 tablet 2  . cyanocobalamin 2000 MCG tablet Take 1 tablet (2,000 mcg total) by mouth daily. 90 tablet 1  . HAVRIX 1440 EL U/ML injection     . irbesartan (AVAPRO) 300 MG tablet Take 1 tablet (300 mg total) by mouth daily. 90 tablet 2  . traZODone (DESYREL) 50 MG tablet TAKE 1 TO 2 TABLETS BY MOUTH AT BEDTIME 180 tablet 1  . desvenlafaxine (PRISTIQ) 50 MG 24 hr tablet Take 50 mg by mouth daily.    . diazepam (VALIUM) 2 MG tablet Take 1 tablet (2 mg total) by mouth every 6 (six) hours as needed for anxiety. 10 tablet 0  . gabapentin (NEURONTIN) 600 MG tablet Take 600 mg by mouth daily.    . indapamide (LOZOL) 2.5 MG tablet TAKE ONE TABLET BY MOUTH DAILY 90 tablet 0  . L-Methylfolate 15 MG TABS Take 1 tablet (15 mg total) by mouth daily. 90 tablet 1   No facility-administered medications  prior to visit.    ROS Review of Systems  Constitutional: Negative for appetite change, diaphoresis, fatigue, fever and unexpected weight change.  HENT: Negative.   Eyes: Negative for visual disturbance.  Respiratory: Negative for cough, chest tightness, shortness of breath and wheezing.   Cardiovascular: Negative for chest pain, palpitations and leg swelling.  Gastrointestinal: Negative for abdominal pain, constipation, diarrhea, nausea and vomiting.  Endocrine: Negative.   Genitourinary: Negative.  Negative for difficulty urinating, dysuria, frequency, hematuria, scrotal swelling, testicular pain and urgency.  Musculoskeletal: Negative.  Negative for arthralgias and myalgias.  Skin: Negative.  Negative for color change.  Neurological: Negative.  Negative for dizziness, weakness, light-headedness, numbness and headaches.  Hematological: Negative for adenopathy. Does not bruise/bleed easily.  Psychiatric/Behavioral: Negative.     Objective:  BP (!) 152/100   Pulse 63   Temp 98.3 F (36.8 C) (Oral)   Resp 16   Ht 5' 11"  (1.803 m)   Wt 238 lb (108 kg)   SpO2 97%   BMI 33.19 kg/m   BP Readings from Last 3 Encounters:  12/25/20 (!) 152/100  12/11/20 136/88  07/31/20 (!) 166/84    Wt Readings from Last 3 Encounters:  12/25/20 238 lb (108 kg)  12/11/20 233 lb 6.4 oz (105.9 kg)  07/31/20 223 lb 9.6 oz (101.4 kg)    Physical Exam Vitals reviewed.  Constitutional:  Appearance: Normal appearance.  HENT:     Mouth/Throat:     Mouth: Mucous membranes are moist.  Eyes:     General: No scleral icterus.    Conjunctiva/sclera: Conjunctivae normal.  Cardiovascular:     Rate and Rhythm: Normal rate and regular rhythm.     Heart sounds: No murmur heard.   Pulmonary:     Effort: Pulmonary effort is normal.     Breath sounds: No stridor. No wheezing, rhonchi or rales.  Abdominal:     General: Abdomen is flat. Bowel sounds are normal. There is no distension.      Palpations: Abdomen is soft. There is no hepatomegaly, splenomegaly or mass.     Tenderness: There is no abdominal tenderness.     Hernia: No hernia is present. There is no hernia in the left inguinal area or right inguinal area.  Genitourinary:    Pubic Area: No rash.      Penis: Normal and circumcised.      Testes: Normal.     Epididymis:     Right: Normal. No mass or tenderness.     Left: Normal. No mass or tenderness.     Prostate: Enlarged (1+ smooth symm BPH). Not tender and no nodules present.     Rectum: Normal. Guaiac result negative. No mass, tenderness, anal fissure, external hemorrhoid or internal hemorrhoid. Normal anal tone.  Musculoskeletal:        General: Normal range of motion.     Cervical back: Neck supple.     Right lower leg: No edema.     Left lower leg: No edema.  Lymphadenopathy:     Cervical: No cervical adenopathy.     Lower Body: No right inguinal adenopathy. No left inguinal adenopathy.  Skin:    General: Skin is warm and dry.  Neurological:     General: No focal deficit present.     Mental Status: He is alert and oriented to person, place, and time. Mental status is at baseline.     Cranial Nerves: Cranial nerves are intact.     Sensory: Sensation is intact.     Motor: Motor function is intact.     Coordination: Coordination is intact.     Comments: Tremulous   Psychiatric:        Mood and Affect: Mood normal.        Behavior: Behavior normal.     Lab Results  Component Value Date   WBC 6.2 12/25/2020   HGB 14.3 12/25/2020   HCT 41.1 12/25/2020   PLT 126.0 (L) 12/25/2020   GLUCOSE 106 (H) 12/25/2020   CHOL 179 12/25/2020   TRIG 100.0 12/25/2020   HDL 69.40 12/25/2020   LDLDIRECT 101.0 12/26/2018   LDLCALC 90 12/25/2020   ALT 37 12/25/2020   AST 47 (H) 12/25/2020   NA 138 12/25/2020   K 4.3 12/25/2020   CL 101 12/25/2020   CREATININE 0.80 12/25/2020   BUN 10 12/25/2020   CO2 30 12/25/2020   TSH 1.56 12/25/2020   PSA 3.32 12/25/2020    INR 1.1 (H) 12/25/2020   HGBA1C 4.4 (L) 12/24/2019    CT ANGIO CHEST AORTA W/CM & OR WO/CM  Result Date: 12/17/2020 CLINICAL DATA:  Ascending aortic aneurysm surveillance. EXAM: CT ANGIOGRAPHY CHEST WITH CONTRAST TECHNIQUE: Multidetector CT imaging of the chest was performed using the standard protocol during bolus administration of intravenous contrast. Multiplanar CT image reconstructions and MIPs were obtained to evaluate the vascular anatomy. CONTRAST:  122m OMNIPAQUE  IOHEXOL 350 MG/ML SOLN COMPARISON:  CTA of the chest from 12/28/2019. FINDINGS: Cardiovascular: No change and mild dilation of the ascending aorta up to 4.2 cm. Mild calcific atherosclerosis of the aorta. Great vessel origins are patent. No evidence of acute arterial abnormality. No evidence of central pulmonary embolism. Borderline enlargement of the heart. Mediastinum/Nodes: No enlarged mediastinal, hilar, or axillary lymph nodes. Thyroid gland, trachea, and esophagus demonstrate no significant findings. Lungs/Pleura: Lungs are clear. No pleural effusion or pneumothorax. Upper Abdomen: Redemonstrated hepatic steatosis. No acute abnormality. Musculoskeletal: No acute abnormality. Flowing anterior osteophytes throughout the thoracic spine, compatible with diffuse idiopathic skeletal hyperostosis. Review of the MIP images confirms the above findings. IMPRESSION: 1. No acute abnormality. Stable mild dilation of the ascending aorta up to 4.2 cm. Recommend annual imaging followup by CTA or MRA. This recommendation follows 2010 ACCF/AHA/AATS/ACR/ASA/SCA/SCAI/SIR/STS/SVM Guidelines for the Diagnosis and Management of Patients with Thoracic Aortic Disease. Circulation. 2010; 121: J825-K539. Aortic aneurysm NOS (ICD10-I71.9) 2. Hepatic steatosis. Electronically Signed   By: Margaretha Sheffield MD   On: 12/17/2020 15:18    Assessment & Plan:   Dilon was seen today for annual exam, hypertension and hyperlipidemia.  Diagnoses and all orders  for this visit:  Essential hypertension- His blood pressure is not well controlled.  I suspect that the variations in his blood pressure are related to alcohol use.  I have asked him to restart indapamide. -     CBC with Differential/Platelet; Future -     Basic metabolic panel; Future -     TSH; Future -     indapamide (LOZOL) 2.5 MG tablet; Take 1 tablet (2.5 mg total) by mouth daily. -     Urinalysis, Routine w reflex microscopic; Future -     Urinalysis, Routine w reflex microscopic -     TSH -     Basic metabolic panel -     CBC with Differential/Platelet  Steatohepatitis- His labs are consistent with a significant amount of alcohol exposure.  I have asked him to abstain from alcohol use. -     Hepatic function panel; Future -     Protime-INR; Future -     Protime-INR -     Hepatic function panel  PSA elevation- His PSA has not risen over the last year.  This is a reassuring sign that he does not have prostate cancer. -     PSA; Future -     PSA  Hyperlipidemia with target LDL less than 130- I recommended that he take a statin for cardiovascular risk reduction. -     Lipid panel; Future -     Lipid panel  B12 deficiency -     CBC with Differential/Platelet; Future -     Vitamin B12; Future -     Folate; Future -     Folate -     Vitamin B12 -     CBC with Differential/Platelet  Thiamine deficiency -     CBC with Differential/Platelet; Future -     Vitamin B1; Future -     Vitamin B1 -     CBC with Differential/Platelet  Restless leg syndrome, familial -     gabapentin (NEURONTIN) 600 MG tablet; Take 1 tablet (600 mg total) by mouth at bedtime.  Severe episode of recurrent major depressive disorder, without psychotic features ([place]) -     desvenlafaxine (PRISTIQ) 50 MG 24 hr tablet; Take 1 tablet (50 mg total) by mouth daily.   I have discontinued  Lillette Boxer. Bless's L-Methylfolate and diazepam. I have also changed his desvenlafaxine, gabapentin, and indapamide.  Additionally, I am having him start on Livalo. Lastly, I am having him maintain his busPIRone, cyanocobalamin, Havrix, ARIPiprazole, carvedilol, irbesartan, and traZODone.  Meds ordered this encounter  Medications  . desvenlafaxine (PRISTIQ) 50 MG 24 hr tablet    Sig: Take 1 tablet (50 mg total) by mouth daily.    Dispense:  90 tablet    Refill:  1  . gabapentin (NEURONTIN) 600 MG tablet    Sig: Take 1 tablet (600 mg total) by mouth at bedtime.    Dispense:  90 tablet    Refill:  1  . indapamide (LOZOL) 2.5 MG tablet    Sig: Take 1 tablet (2.5 mg total) by mouth daily.    Dispense:  90 tablet    Refill:  0  . Pitavastatin Calcium (LIVALO) 2 MG TABS    Sig: Take 1 tablet (2 mg total) by mouth daily.    Dispense:  90 tablet    Refill:  1     Follow-up: Return in about 3 months (around 03/25/2021).  Scarlette Calico, MD

## 2020-12-29 LAB — VITAMIN B1: Vitamin B1 (Thiamine): 47 nmol/L — ABNORMAL HIGH (ref 8–30)

## 2020-12-31 DIAGNOSIS — Z0001 Encounter for general adult medical examination with abnormal findings: Secondary | ICD-10-CM | POA: Insufficient documentation

## 2020-12-31 MED ORDER — LIVALO 2 MG PO TABS: 1.0000 | ORAL_TABLET | Freq: Every day | ORAL | 1 refills | Status: DC

## 2020-12-31 NOTE — Assessment & Plan Note (Signed)
Exam completed Labs reviewed Vaccines reviewed and updated Cancer screenings are up-to-date Patient education was given

## 2021-05-19 ENCOUNTER — Other Ambulatory Visit: Payer: Self-pay

## 2021-05-19 ENCOUNTER — Encounter: Payer: Self-pay | Admitting: Internal Medicine

## 2021-05-19 ENCOUNTER — Ambulatory Visit (INDEPENDENT_AMBULATORY_CARE_PROVIDER_SITE_OTHER): Payer: PPO | Admitting: Internal Medicine

## 2021-05-19 VITALS — BP 120/62 | HR 71 | Temp 98.8°F | Ht 71.0 in | Wt 235.0 lb

## 2021-05-19 DIAGNOSIS — E519 Thiamine deficiency, unspecified: Secondary | ICD-10-CM | POA: Diagnosis not present

## 2021-05-19 DIAGNOSIS — D539 Nutritional anemia, unspecified: Secondary | ICD-10-CM | POA: Diagnosis not present

## 2021-05-19 DIAGNOSIS — I483 Typical atrial flutter: Secondary | ICD-10-CM | POA: Diagnosis not present

## 2021-05-19 DIAGNOSIS — K7581 Nonalcoholic steatohepatitis (NASH): Secondary | ICD-10-CM | POA: Diagnosis not present

## 2021-05-19 DIAGNOSIS — R0600 Dyspnea, unspecified: Secondary | ICD-10-CM | POA: Diagnosis not present

## 2021-05-19 DIAGNOSIS — R9431 Abnormal electrocardiogram [ECG] [EKG]: Secondary | ICD-10-CM | POA: Diagnosis not present

## 2021-05-19 DIAGNOSIS — D508 Other iron deficiency anemias: Secondary | ICD-10-CM | POA: Diagnosis not present

## 2021-05-19 DIAGNOSIS — I10 Essential (primary) hypertension: Secondary | ICD-10-CM

## 2021-05-19 DIAGNOSIS — E538 Deficiency of other specified B group vitamins: Secondary | ICD-10-CM

## 2021-05-19 DIAGNOSIS — R0609 Other forms of dyspnea: Secondary | ICD-10-CM | POA: Insufficient documentation

## 2021-05-19 LAB — CBC WITH DIFFERENTIAL/PLATELET
Basophils Absolute: 0 10*3/uL (ref 0.0–0.1)
Basophils Relative: 0.4 % (ref 0.0–3.0)
Eosinophils Absolute: 0.1 10*3/uL (ref 0.0–0.7)
Eosinophils Relative: 1.6 % (ref 0.0–5.0)
HCT: 32.6 % — ABNORMAL LOW (ref 39.0–52.0)
Hemoglobin: 11.4 g/dL — ABNORMAL LOW (ref 13.0–17.0)
Lymphocytes Relative: 21.1 % (ref 12.0–46.0)
Lymphs Abs: 1.5 10*3/uL (ref 0.7–4.0)
MCHC: 35.1 g/dL (ref 30.0–36.0)
MCV: 107.8 fl — ABNORMAL HIGH (ref 78.0–100.0)
Monocytes Absolute: 0.5 10*3/uL (ref 0.1–1.0)
Monocytes Relative: 7.6 % (ref 3.0–12.0)
Neutro Abs: 4.9 10*3/uL (ref 1.4–7.7)
Neutrophils Relative %: 69.3 % (ref 43.0–77.0)
Platelets: 162 10*3/uL (ref 150.0–400.0)
RBC: 3.02 Mil/uL — ABNORMAL LOW (ref 4.22–5.81)
RDW: 13.7 % (ref 11.5–15.5)
WBC: 7.1 10*3/uL (ref 4.0–10.5)

## 2021-05-19 LAB — BASIC METABOLIC PANEL
BUN: 17 mg/dL (ref 6–23)
CO2: 30 mEq/L (ref 19–32)
Calcium: 9.4 mg/dL (ref 8.4–10.5)
Chloride: 100 mEq/L (ref 96–112)
Creatinine, Ser: 0.93 mg/dL (ref 0.40–1.50)
GFR: 84.41 mL/min (ref >60.00)
Glucose, Bld: 93 mg/dL (ref 70–99)
Potassium: 3.5 mEq/L (ref 3.5–5.1)
Sodium: 138 mEq/L (ref 135–145)

## 2021-05-19 LAB — PROTIME-INR
INR: 1.1 ratio — ABNORMAL HIGH (ref 0.8–1.0)
Prothrombin Time: 12.8 s (ref 9.6–13.1)

## 2021-05-19 LAB — HEPATIC FUNCTION PANEL
ALT: 37 U/L (ref 0–53)
AST: 48 U/L — ABNORMAL HIGH (ref 0–37)
Albumin: 4 g/dL (ref 3.5–5.2)
Alkaline Phosphatase: 66 U/L (ref 39–117)
Bilirubin, Direct: 0.2 mg/dL (ref 0.0–0.3)
Total Bilirubin: 0.4 mg/dL (ref 0.2–1.2)
Total Protein: 7 g/dL (ref 6.0–8.3)

## 2021-05-19 LAB — BRAIN NATRIURETIC PEPTIDE: Pro B Natriuretic peptide (BNP): 158 pg/mL — ABNORMAL HIGH (ref 0.0–100.0)

## 2021-05-19 LAB — TROPONIN I (HIGH SENSITIVITY): High Sens Troponin I: 5 ng/L (ref 2–17)

## 2021-05-19 LAB — FOLATE: Folate: 9.4 ng/mL (ref >5.9)

## 2021-05-19 NOTE — Progress Notes (Signed)
Subjective:  Patient ID: Lee Lee, male    DOB: [birth year]  Age: 70 y.o. MRN: [id number]  CC: Hypertension and Anemia  This visit occurred during the SARS-CoV-2 public health emergency.  Safety protocols were in place, including screening questions prior to the visit, additional usage of staff PPE, and extensive cleaning of exam room while observing appropriate contact time as indicated for disinfecting solutions.    HPI Lee Lee presents for f/up -   He complains of dyspnea on exertion and fatigue for about 6 months.  Over the last few weeks he has had some palpitations and while playing golf he got overheated and had a dizzy/syncopal episode with low BP a week ago and has abrasions on his knees.  He is not taking indapamide.  Outpatient Medications Prior to Visit  Medication Sig Dispense Refill   ARIPiprazole (ABILIFY) 2 MG tablet TAKE ONE TABLET BY MOUTH DAILY 90 tablet 0   busPIRone (BUSPAR) 15 MG tablet Take one tablet twice daily. 180 tablet 1   carvedilol (COREG) 12.5 MG tablet TAKE ONE TABLET BY MOUTH TWICE A DAY WITH A MEAL 180 tablet 2   cyanocobalamin 2000 MCG tablet Take 1 tablet (2,000 mcg total) by mouth daily. 90 tablet 1   desvenlafaxine (PRISTIQ) 50 MG 24 hr tablet Take 1 tablet (50 mg total) by mouth daily. 90 tablet 1   gabapentin (NEURONTIN) 600 MG tablet Take 1 tablet (600 mg total) by mouth at bedtime. 90 tablet 1   irbesartan (AVAPRO) 300 MG tablet Take 1 tablet (300 mg total) by mouth daily. 90 tablet 2   Pitavastatin Calcium (LIVALO) 2 MG TABS Take 1 tablet (2 mg total) by mouth daily. 90 tablet 1   traZODone (DESYREL) 50 MG tablet TAKE 1 TO 2 TABLETS BY MOUTH AT BEDTIME 180 tablet 1   HAVRIX 1440 EL U/ML injection      indapamide (LOZOL) 2.5 MG tablet Take 1 tablet (2.5 mg total) by mouth daily. 90 tablet 0   No facility-administered medications prior to visit.    ROS Review of Systems  Constitutional:  Positive for fatigue.  Respiratory:   Positive for shortness of breath (DOE). Negative for cough, chest tightness and wheezing.   Cardiovascular:  Negative for chest pain, palpitations and leg swelling.  Gastrointestinal:  Negative for abdominal pain, blood in stool, diarrhea, nausea and vomiting.  Genitourinary: Negative.  Negative for difficulty urinating.  Musculoskeletal: Negative.  Negative for arthralgias and myalgias.  Skin: Negative.   Neurological:  Positive for dizziness and syncope. Negative for weakness and light-headedness.  Hematological:  Negative for adenopathy. Does not bruise/bleed easily.   Objective:  BP 120/62 (BP Location: Right Arm, Patient Position: Sitting, Cuff Size: Large)   Pulse 71   Temp 98.8 F (37.1 C) (Oral)   Ht 5' 11"  (1.803 m)   Wt 235 lb (106.6 kg)   SpO2 96%   BMI 32.78 kg/m   BP Readings from Last 3 Encounters:  05/21/21 121/78  05/19/21 120/62  12/25/20 (!) 152/100    Wt Readings from Last 3 Encounters:  05/19/21 235 lb (106.6 kg)  12/25/20 238 lb (108 kg)  12/11/20 233 lb 6.4 oz (105.9 kg)    Physical Exam Vitals reviewed.  HENT:     Nose: Nose normal.     Mouth/Throat:     Mouth: Mucous membranes are moist.  Eyes:     General: No scleral icterus.    Conjunctiva/sclera: Conjunctivae normal.  Cardiovascular:  Rate and Rhythm: Normal rate and regular rhythm.     Heart sounds: No murmur heard.   No gallop.     Comments: EKG- NSR, 65 bpm Incomplete RBBB is not new Q waves in III and aVF are new TWI in lateral leads is new Pulmonary:     Effort: Pulmonary effort is normal.     Breath sounds: No stridor. No wheezing, rhonchi or rales.  Abdominal:     General: Abdomen is protuberant. Bowel sounds are normal. There is no distension.     Palpations: Abdomen is soft. There is no hepatomegaly, splenomegaly or mass.     Tenderness: There is no abdominal tenderness.  Musculoskeletal:        General: Normal range of motion.     Cervical back: Neck supple.     Right  lower leg: No edema.     Left lower leg: No edema.  Lymphadenopathy:     Cervical: No cervical adenopathy.  Skin:    General: Skin is warm and dry.  Neurological:     General: No focal deficit present.     Mental Status: He is alert.  Psychiatric:        Mood and Affect: Mood normal.    Lab Results  Component Value Date   WBC 6.8 05/20/2021   HGB 11.0 (L) 05/20/2021   HCT 32.5 (L) 05/20/2021   PLT 147 (L) 05/20/2021   GLUCOSE 89 05/20/2021   CHOL 179 12/25/2020   TRIG 100.0 12/25/2020   HDL 69.40 12/25/2020   LDLDIRECT 101.0 12/26/2018   LDLCALC 90 12/25/2020   ALT 37 05/19/2021   AST 48 (H) 05/19/2021   NA 140 05/20/2021   K 3.5 05/20/2021   CL 100 05/20/2021   CREATININE 0.90 05/20/2021   BUN 16 05/20/2021   CO2 28 05/20/2021   TSH 1.21 05/19/2021   PSA 3.32 12/25/2020   INR 1.1 (H) 05/19/2021   HGBA1C 4.4 (L) 12/24/2019    CT ANGIO CHEST AORTA W/CM & OR WO/CM  Result Date: 12/17/2020 CLINICAL DATA:  Ascending aortic aneurysm surveillance. EXAM: CT ANGIOGRAPHY CHEST WITH CONTRAST TECHNIQUE: Multidetector CT imaging of the chest was performed using the standard protocol during bolus administration of intravenous contrast. Multiplanar CT image reconstructions and MIPs were obtained to evaluate the vascular anatomy. CONTRAST:  130m OMNIPAQUE IOHEXOL 350 MG/ML SOLN COMPARISON:  CTA of the chest from 12/28/2019. FINDINGS: Cardiovascular: No change and mild dilation of the ascending aorta up to 4.2 cm. Mild calcific atherosclerosis of the aorta. Great vessel origins are patent. No evidence of acute arterial abnormality. No evidence of central pulmonary embolism. Borderline enlargement of the heart. Mediastinum/Nodes: No enlarged mediastinal, hilar, or axillary lymph nodes. Thyroid gland, trachea, and esophagus demonstrate no significant findings. Lungs/Pleura: Lungs are clear. No pleural effusion or pneumothorax. Upper Abdomen: Redemonstrated hepatic steatosis. No acute  abnormality. Musculoskeletal: No acute abnormality. Flowing anterior osteophytes throughout the thoracic spine, compatible with diffuse idiopathic skeletal hyperostosis. Review of the MIP images confirms the above findings. IMPRESSION: 1. No acute abnormality. Stable mild dilation of the ascending aorta up to 4.2 cm. Recommend annual imaging followup by CTA or MRA. This recommendation follows 2010 ACCF/AHA/AATS/ACR/ASA/SCA/SCAI/SIR/STS/SVM Guidelines for the Diagnosis and Management of Patients with Thoracic Aortic Disease. Circulation. 2010; 121:: [id number] Aortic aneurysm NOS (ICD10-I71.9) 2. Hepatic steatosis. Electronically Signed   By: FMargaretha SheffieldMD   On: 12/17/2020 15:18    Assessment & Plan:   SWinsonwas seen today for hypertension and anemia.  Diagnoses and all orders for this visit:  Essential hypertension- His blood pressure is adequately well controlled. -     EKG 12-Lead -     Basic metabolic panel; Future -     Thyroid Panel With TSH; Future -     Thyroid Panel With TSH -     Basic metabolic panel  Steatohepatitis - His liver enzymes remain mildly elevated.  I encouraged him to avoid alcohol. -     Hepatic function panel; Future -     Protime-INR; Future -     Protime-INR -     Hepatic function panel  Typical atrial flutter ([place])- He has had some recent palpitations and has an abnormal EKG.  I have asked him to see cardiology. -     Thyroid Panel With TSH; Future -     Thyroid Panel With TSH  B12 deficiency -     Vitamin B12; Future -     CBC with Differential/Platelet; Future -     Folate; Future -     Folate -     CBC with Differential/Platelet -     Vitamin B12  DOE (dyspnea on exertion)- I think multiple factors are contributing to this.  His BNP and troponin are normal.  His EKG has changed so I have asked him to see cardiology as soon as possible. -     Troponin I (High Sensitivity); Future -     Brain natriuretic peptide; Future -     Brain natriuretic  peptide -     Troponin I (High Sensitivity)  Abnormal electrocardiogram (ECG) (EKG) -     Ambulatory referral to Cardiology  Deficiency anemia- I will screen him for vitamin deficiencies. -     Iron; Future -     Vitamin B1; Future -     Ferritin; Future  Thiamine deficiency -     Vitamin B1; Future  Other iron deficiency anemia -     Iron; Future -     Ferritin; Future  I have discontinued Daekwon Beswick. Cronkright's Havrix and indapamide. I am also having him maintain his busPIRone, cyanocobalamin, ARIPiprazole, carvedilol, irbesartan, traZODone, desvenlafaxine, gabapentin, and Livalo.  No orders of the defined types were placed in this encounter.    Follow-up: No follow-ups on file.  Scarlette Calico, MD

## 2021-05-20 ENCOUNTER — Other Ambulatory Visit: Payer: Self-pay

## 2021-05-20 ENCOUNTER — Other Ambulatory Visit (INDEPENDENT_AMBULATORY_CARE_PROVIDER_SITE_OTHER): Payer: PPO

## 2021-05-20 ENCOUNTER — Emergency Department (HOSPITAL_BASED_OUTPATIENT_CLINIC_OR_DEPARTMENT_OTHER)
Admission: EM | Admit: 2021-05-20 | Discharge: 2021-05-21 | Disposition: A | Discharge status: Home / Self Care | Payer: PPO | Attending: Emergency Medicine | Admitting: Emergency Medicine

## 2021-05-20 ENCOUNTER — Emergency Department (HOSPITAL_BASED_OUTPATIENT_CLINIC_OR_DEPARTMENT_OTHER): Payer: PPO | Admitting: Radiology

## 2021-05-20 DIAGNOSIS — R079 Chest pain, unspecified: Secondary | ICD-10-CM | POA: Diagnosis not present

## 2021-05-20 DIAGNOSIS — E519 Thiamine deficiency, unspecified: Secondary | ICD-10-CM | POA: Diagnosis not present

## 2021-05-20 DIAGNOSIS — R072 Precordial pain: Secondary | ICD-10-CM | POA: Insufficient documentation

## 2021-05-20 DIAGNOSIS — Z96652 Presence of left artificial knee joint: Secondary | ICD-10-CM | POA: Diagnosis not present

## 2021-05-20 DIAGNOSIS — R0789 Other chest pain: Secondary | ICD-10-CM | POA: Diagnosis not present

## 2021-05-20 DIAGNOSIS — D508 Other iron deficiency anemias: Secondary | ICD-10-CM | POA: Diagnosis not present

## 2021-05-20 DIAGNOSIS — Z85820 Personal history of malignant melanoma of skin: Secondary | ICD-10-CM | POA: Diagnosis not present

## 2021-05-20 DIAGNOSIS — Z79899 Other long term (current) drug therapy: Secondary | ICD-10-CM | POA: Diagnosis not present

## 2021-05-20 DIAGNOSIS — Z87891 Personal history of nicotine dependence: Secondary | ICD-10-CM | POA: Insufficient documentation

## 2021-05-20 DIAGNOSIS — D539 Nutritional anemia, unspecified: Secondary | ICD-10-CM | POA: Insufficient documentation

## 2021-05-20 LAB — CBC
HCT: 32.5 % — ABNORMAL LOW (ref 39.0–52.0)
Hemoglobin: 11 g/dL — ABNORMAL LOW (ref 13.0–17.0)
MCH: 36.9 pg — ABNORMAL HIGH (ref 26.0–34.0)
MCHC: 33.8 g/dL (ref 30.0–36.0)
MCV: 109.1 fL — ABNORMAL HIGH (ref 80.0–100.0)
Platelets: 147 10*3/uL — ABNORMAL LOW (ref 150–400)
RBC: 2.98 MIL/uL — ABNORMAL LOW (ref 4.22–5.81)
RDW: 13.3 % (ref 11.5–15.5)
WBC: 6.8 10*3/uL (ref 4.0–10.5)
nRBC: 0 % (ref 0.0–0.2)

## 2021-05-20 LAB — BASIC METABOLIC PANEL
Anion gap: 12 (ref 5–15)
BUN: 16 mg/dL (ref 8–23)
CO2: 28 mmol/L (ref 22–32)
Calcium: 9.1 mg/dL (ref 8.9–10.3)
Chloride: 100 mmol/L (ref 98–111)
Creatinine, Ser: 0.9 mg/dL (ref 0.61–1.24)
GFR, Estimated: >60 mL/min (ref >60)
Glucose, Bld: 89 mg/dL (ref 70–99)
Potassium: 3.5 mmol/L (ref 3.5–5.1)
Sodium: 140 mmol/L (ref 135–145)

## 2021-05-20 LAB — VITAMIN B12: Vitamin B-12: 812 pg/mL (ref 211–911)

## 2021-05-20 LAB — THYROID PANEL WITH TSH
Free Thyroxine Index: 2.3 (ref 1.4–3.8)
T3 Uptake: 33 % (ref 22–35)
T4, Total: 6.9 ug/dL (ref 4.9–10.5)
TSH: 1.21 mIU/L (ref 0.40–4.50)

## 2021-05-20 LAB — TROPONIN I (HIGH SENSITIVITY): Troponin I (High Sensitivity): 5 ng/L (ref <18)

## 2021-05-20 LAB — FERRITIN: Ferritin: 205.8 ng/mL (ref 22.0–322.0)

## 2021-05-20 LAB — IRON: Iron: 104 ug/dL (ref 42–165)

## 2021-05-20 NOTE — ED Triage Notes (Signed)
Patient presents to the ER for chest pain. Patient reports he has had 2 previous ablations for atrial flutter. Patient reports 2014 and 2016. Patient states he feels dizzy and nauseated.

## 2021-05-21 LAB — TROPONIN I (HIGH SENSITIVITY): Troponin I (High Sensitivity): 5 ng/L (ref <18)

## 2021-05-21 NOTE — ED Provider Notes (Signed)
[facility] EMERGENCY DEPT Provider Note  CSN: [id number] Arrival date & time: 05/20/21 2117  Chief Complaint(s) Chest Pain  HPI Lee AMBLE is a 70 y.o. male    Chest Pain Pain location:  Substernal area Pain quality: pressure   Pain severity:  Moderate Onset quality:  Gradual Duration:  30 minutes Timing:  Constant Progression:  Resolved Relieved by:  Nothing Worsened by:  Nothing Associated symptoms: no fatigue, no nausea, no shortness of breath and no vomiting    Has had negative stress test in 2020 and 2021.  PCP said they saw new changes in his EKG that were concerning for a heart attack yesterday.  Past Medical History Past Medical History:  Diagnosis Date   Alcohol abuse, episodic drinking behavior    Anxiety    Arthritis    back & knees   Atrial flutter ([place])    s/p RFCA 01/05/13   Calcium oxalate renal stones    Complication of anesthesia    makes him loopy   Depression    ED (erectile dysfunction)    Hemorrhoids    Hypertension    Patient Active Problem List   Diagnosis Date Noted   Deficiency anemia 05/20/2021   DOE (dyspnea on exertion) 05/19/2021   Encounter for general adult medical examination with abnormal findings 12/31/2020   Steatohepatitis 06/18/2020   Severe episode of recurrent major depressive disorder, without psychotic features ([place]) 06/18/2020   Seasonal allergic rhinitis 06/18/2020   Abnormal electrocardiogram (ECG) (EKG) 06/18/2020   PSA elevation 12/24/2019   B12 deficiency 12/24/2019   Thoracic aortic aneurysm without rupture ([place]) 12/27/2018   Seasonal allergic rhinitis due to pollen 12/27/2018   Benign prostatic hyperplasia without lower urinary tract symptoms 12/26/2018   Unilateral primary osteoarthritis, left knee 03/08/2018   Insomnia due to anxiety and fear 09/30/2016   Restless leg syndrome, familial 09/30/2016   Iron deficiency anemia 09/30/2016   Hypertriglyceridemia 06/15/2016   Hyperlipidemia  with target LDL less than 130 06/15/2016   S/P radiofrequency ablation operation for arrhythmia 07/04/15 07/05/2015   SVT (supraventricular tachycardia) ([place]) 07/04/2015   Melanoma of skin ([place]) 04/16/2015   Atrial flutter ([place]) 12/07/2012   Essential hypertension 11/07/2012   Ascending aortic aneurysm ([place]) 11/07/2012   ED (erectile dysfunction) 11/15/2011   Home Medication(s) Prior to Admission medications   Medication Sig Start Date End Date Taking? Authorizing Provider  ARIPiprazole (ABILIFY) 2 MG tablet TAKE ONE TABLET BY MOUTH DAILY 11/23/20   Janith Lima, MD  busPIRone (BUSPAR) 15 MG tablet Take one tablet twice daily. 12/18/19   Mozingo, Berdie Ogren, NP  carvedilol (COREG) 12.5 MG tablet TAKE ONE TABLET BY MOUTH TWICE A DAY WITH A MEAL 12/11/20   Baldwin Jamaica, PA-C  cyanocobalamin 2000 MCG tablet Take 1 tablet (2,000 mcg total) by mouth daily. 12/24/19   Janith Lima, MD  desvenlafaxine (PRISTIQ) 50 MG 24 hr tablet Take 1 tablet (50 mg total) by mouth daily. 12/25/20   Janith Lima, MD  gabapentin (NEURONTIN) 600 MG tablet Take 1 tablet (600 mg total) by mouth at bedtime. 12/25/20   Janith Lima, MD  irbesartan (AVAPRO) 300 MG tablet Take 1 tablet (300 mg total) by mouth daily. 12/11/20   Baldwin Jamaica, PA-C  Pitavastatin Calcium (LIVALO) 2 MG TABS Take 1 tablet (2 mg total) by mouth daily. 12/31/20   Janith Lima, MD  traZODone (DESYREL) 50 MG tablet TAKE 1 TO 2 TABLETS BY MOUTH AT BEDTIME 12/24/20  Mozingo, Berdie Ogren, NP                                                                                                                                    Past Surgical History Past Surgical History:  Procedure Laterality Date   ABLATION OF DYSRHYTHMIC FOCUS  01/05/2013   ARTHROSCOPIC REPAIR ACL     ATRIAL FLUTTER ABLATION N/A 01/05/2013   Procedure: ATRIAL FLUTTER ABLATION;  Surgeon: Evans Lance, MD;  Location: [facility] LAB;  Service: Cardiovascular;   Laterality: N/A;   COLONOSCOPY  11/2009   Dr. Benson Norway   ELECTROPHYSIOLOGIC STUDY N/A 07/04/2015   Procedure: A-Flutter Ablation;  Surgeon: Evans Lance, MD;  Location: [facility] CV LAB;  Service: Cardiovascular;  Laterality: N/A;   ROTATOR CUFF REPAIR     TOTAL KNEE ARTHROPLASTY Left 10/30/2019   Procedure: TOTAL KNEE ARTHROPLASTY;  Surgeon: Paralee Cancel, MD;  Location: [facility] ORS;  Service: Orthopedics;  Laterality: Left;  70 mins   Family History Family History  Problem Relation Age of Onset   Valvular heart disease Mother    Lymphoma Mother     Social History Social History   Tobacco Use   Smoking status: Former    Pack years: 0.00   Smokeless tobacco: Current    Types: Chew   Tobacco comments:    chews tobacco when playing golf only  Vaping Use   Vaping Use: Never used  Substance Use Topics   Alcohol use: Yes    Alcohol/week: 5.0 standard drinks    Types: 5 Glasses of wine per week   Drug use: No   Allergies Patient has no known allergies.  Review of Systems Review of Systems  Constitutional:  Negative for fatigue.  Respiratory:  Negative for shortness of breath.   Cardiovascular:  Positive for chest pain.  Gastrointestinal:  Negative for nausea and vomiting.  All other systems are reviewed and are negative for acute change except as noted in the HPI  Physical Exam Vital Signs  I have reviewed the triage vital signs BP 130/74 (BP Location: Right Arm)   Pulse (!) 59   Temp 98.9 F (37.2 C) (Oral)   Resp 18   SpO2 99%   Physical Exam Vitals reviewed.  Constitutional:      General: He is not in acute distress.    Appearance: He is well-developed. He is not diaphoretic.  HENT:     Head: Normocephalic and atraumatic.     Nose: Nose normal.  Eyes:     General: No scleral icterus.       Right eye: No discharge.        Left eye: No discharge.     Conjunctiva/sclera: Conjunctivae normal.     Pupils: Pupils are equal, round, and reactive to light.   Cardiovascular:     Rate and Rhythm: Normal rate and regular rhythm.     Heart sounds: No  murmur heard.   No friction rub. No gallop.  Pulmonary:     Effort: Pulmonary effort is normal. No respiratory distress.     Breath sounds: Normal breath sounds. No stridor. No rales.  Abdominal:     General: There is no distension.     Palpations: Abdomen is soft.     Tenderness: There is no abdominal tenderness.  Musculoskeletal:        General: No tenderness.     Cervical back: Normal range of motion and neck supple.  Skin:    General: Skin is warm and dry.     Findings: No erythema or rash.  Neurological:     Mental Status: He is alert and oriented to person, place, and time.    ED Results and Treatments Labs (all labs ordered are listed, but only abnormal results are displayed) Labs Reviewed  CBC - Abnormal; Notable for the following components:      Result Value   RBC 2.98 (*)    Hemoglobin 11.0 (*)    HCT 32.5 (*)    MCV 109.1 (*)    MCH 36.9 (*)    Platelets 147 (*)    All other components within normal limits  BASIC METABOLIC PANEL  TROPONIN I (HIGH SENSITIVITY)  TROPONIN I (HIGH SENSITIVITY)                                                                                                                         EKG  EKG Interpretation  Date/Time:  Wednesday May 20 2021 21:28:13 EDT Ventricular Rate:  66 PR Interval:  194 QRS Duration: 92 QT Interval:  428 QTC Calculation: 448 R Axis:   31 Text Interpretation: Normal sinus rhythm Nonspecific T wave abnormality Abnormal ECG No significant change since last tracing Confirmed by Addison Lank [phone]) on 05/21/2021 12:10:39 AM        Radiology DG Chest 2 View  Result Date: 05/20/2021 CLINICAL DATA:  Chest pain EXAM: CHEST - 2 VIEW COMPARISON:  09/24/2012, CT chest 01/17/2019, 12/17/2020 FINDINGS: The heart size and mediastinal contours are within normal limits. Both lungs are clear. Degenerative changes of the  spine. IMPRESSION: No active cardiopulmonary disease. Electronically Signed   By: Donavan Foil M.D.   On: 05/20/2021 22:01    Pertinent labs & imaging results that were available during my care of the patient were reviewed by me and considered in my medical decision making (see chart for details).  Medications Ordered in ED Medications - No data to display  Procedures Procedures  (including critical care time)  Medical Decision Making / ED Course I have reviewed the nursing notes for this encounter and the patient's prior records (if available in EHR or on provided paperwork).   YOON BARCA was evaluated in Emergency Department on 05/21/2021 for the symptoms described in the history of present illness. He was evaluated in the context of the global COVID-19 pandemic, which necessitated consideration that the patient might be at risk for infection with the SARS-CoV-2 virus that causes COVID-19. Institutional protocols and algorithms that pertain to the evaluation of patients at risk for COVID-19 are in a state of rapid change based on information released by regulatory bodies including the CDC and federal and state organizations. These policies and algorithms were followed during the patient's care in the ED.  Atypical chest pain. EKG without acute ischemic changes or evidence of pericarditis. Serial troponins negative x2. Heart score 4. Patient already has upcoming appointment with his cardiologist and has had negative stress test within the year. Feel patient is appropriate for continued outpatient follow-up.  Low suspicion for pulmonary embolism. Presentation not classic for aortic dissection as perforation. Chest x-ray without evidence suggestive of pneumonia, pneumothorax, pneumomediastinum.  No abnormal contour of the mediastinum to suggest dissection. No  evidence of acute injuries.       Final Clinical Impression(s) / ED Diagnoses Final diagnoses:  Atypical chest pain    The patient appears reasonably screened and/or stabilized for discharge and I doubt any other medical condition or other [facility] requiring further screening, evaluation, or treatment in the ED at this time prior to discharge. Safe for discharge with strict return precautions.  Disposition: Discharge  Condition: Good  I have discussed the results, Dx and Tx plan with the patient/family who expressed understanding and agree(s) with the plan. Discharge instructions discussed at length. The patient/family was given strict return precautions who verbalized understanding of the instructions. No further questions at time of discharge.    ED Discharge Orders     None        Follow Up: Janith Lima, [place]  [id number] [phone]     Cardiology  Go to  as scheduled     This chart was dictated using voice recognition software.  Despite best efforts to proofread,  errors can occur which can change the documentation meaning.    Fatima Blank, MD 05/21/21 [phone]

## 2021-05-31 LAB — VITAMIN B1: Vitamin B1 (Thiamine): <6 nmol/L — ABNORMAL LOW (ref 8–30)

## 2021-06-01 ENCOUNTER — Other Ambulatory Visit: Payer: Self-pay | Admitting: Internal Medicine

## 2021-06-01 DIAGNOSIS — E519 Thiamine deficiency, unspecified: Secondary | ICD-10-CM

## 2021-06-01 MED ORDER — VITAMIN B-1 50 MG PO TABS: 50.0000 mg | ORAL_TABLET | Freq: Every day | ORAL | 1 refills | Status: DC

## 2021-06-04 ENCOUNTER — Encounter: Payer: Self-pay | Admitting: Internal Medicine

## 2021-06-19 ENCOUNTER — Other Ambulatory Visit: Payer: Self-pay

## 2021-06-19 ENCOUNTER — Ambulatory Visit: Payer: PPO | Admitting: Internal Medicine

## 2021-06-19 ENCOUNTER — Encounter: Payer: Self-pay | Admitting: Internal Medicine

## 2021-06-19 VITALS — BP 108/74 | HR 60 | Ht 71.0 in | Wt 232.0 lb

## 2021-06-19 DIAGNOSIS — I10 Essential (primary) hypertension: Secondary | ICD-10-CM | POA: Diagnosis not present

## 2021-06-19 DIAGNOSIS — R9431 Abnormal electrocardiogram [ECG] [EKG]: Secondary | ICD-10-CM

## 2021-06-19 DIAGNOSIS — R0600 Dyspnea, unspecified: Secondary | ICD-10-CM | POA: Diagnosis not present

## 2021-06-19 DIAGNOSIS — R0609 Other forms of dyspnea: Secondary | ICD-10-CM

## 2021-06-19 NOTE — Patient Instructions (Addendum)
Medication Instructions:  Your physician recommends that you continue on your current medications as directed. Please refer to the Current Medication list given to you today.  Labwork: None ordered.  Testing/Procedures: You will be scheduled for an exercise myoview stress test.  Follow-Up: Your physician wants you to follow-up in: one year with Cristopher Peru, MD or one of the following Advanced Practice Providers on your designated Care Team:   Tommye Standard, [place] Legrand Como "Jonni Sanger" Chalmers Cater, [place]  Any Other Special Instructions Will Be Listed Below (If Applicable).  If you need a refill on your cardiac medications before your next appointment, please call your pharmacy.

## 2021-06-19 NOTE — Progress Notes (Signed)
HPI Lee Lee returns today for followup. He is a very pleasant 70 year old man with a history of atrial flutter, status post catheter ablation. 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 He has done well for over a year but was noted to have an abnormal ECG. He has subtle T wave changes. He does not have angina. He notes sob when he gets outside. No chest pain. He has retired and remains active. He has been discovered to have an aortic aneurysm which has so far been stable.  No Known Allergies   Current Outpatient Medications  Medication Sig Dispense Refill   ARIPiprazole (ABILIFY) 2 MG tablet TAKE ONE TABLET BY MOUTH DAILY 90 tablet 0   busPIRone (BUSPAR) 15 MG tablet Take one tablet twice daily. 180 tablet 1   carvedilol (COREG) 12.5 MG tablet TAKE ONE TABLET BY MOUTH TWICE A DAY WITH A MEAL 180 tablet 2   cyanocobalamin 2000 MCG tablet Take 1 tablet (2,000 mcg total) by mouth daily. 90 tablet 1   desvenlafaxine (PRISTIQ) 50 MG 24 hr tablet Take 1 tablet (50 mg total) by mouth daily. 90 tablet 1   gabapentin (NEURONTIN) 600 MG tablet Take 1 tablet (600 mg total) by mouth at bedtime. 90 tablet 1   irbesartan (AVAPRO) 300 MG tablet Take 1 tablet (300 mg total) by mouth daily. 90 tablet 2   Pitavastatin Calcium (LIVALO) 2 MG TABS Take 1 tablet (2 mg total) by mouth daily. 90 tablet 1   thiamine (VITAMIN B-1) 50 MG tablet Take 1 tablet (50 mg total) by mouth daily. 90 tablet 1   traZODone (DESYREL) 50 MG tablet TAKE 1 TO 2 TABLETS BY MOUTH AT BEDTIME 180 tablet 1   No current facility-administered medications for this visit.     Past Medical History:  Diagnosis Date   Alcohol abuse, episodic drinking behavior    Anxiety    Arthritis    back & knees   Atrial flutter ([facility])    s/p RFCA 01/05/13   Calcium oxalate renal stones     Complication of anesthesia    makes him loopy   Depression    ED (erectile dysfunction)    Hemorrhoids    Hypertension     ROS:   All systems reviewed and negative except as noted in the HPI.   Past Surgical History:  Procedure Laterality Date   ABLATION OF DYSRHYTHMIC FOCUS  01/05/2013   ARTHROSCOPIC REPAIR ACL     ATRIAL FLUTTER ABLATION N/A 01/05/2013   Procedure: ATRIAL FLUTTER ABLATION;  Surgeon: Evans Lance, MD;  Location: [facility] CATH LAB;  Service: Cardiovascular;  Laterality: N/A;   COLONOSCOPY  11/2009   Dr. Benson Norway   ELECTROPHYSIOLOGIC STUDY N/A 07/04/2015   Procedure: A-Flutter Ablation;  Surgeon: Evans Lance, MD;  Location: [facility] CV LAB;  Service: Cardiovascular;  Laterality: N/A;   ROTATOR CUFF REPAIR     TOTAL KNEE ARTHROPLASTY Left 10/30/2019   Procedure: TOTAL KNEE ARTHROPLASTY;  Surgeon: Paralee Cancel, MD;  Location: [facility] ORS;  Service: Orthopedics;  Laterality: Left;  70 mins     Family History  Problem Relation Age of Onset   Valvular heart disease Mother    Lymphoma Mother      Social History   Socioeconomic History   Marital status: Married    Spouse name: Not on file  Number of children: Not on file   Years of education: Not on file   Highest education level: Not on file  Occupational History   Occupation: Retired  Tobacco Use   Smoking status: Former   Smokeless tobacco: Current    Types: Chew   Tobacco comments:    chews tobacco when playing golf only  Vaping Use   Vaping Use: Never used  Substance and Sexual Activity   Alcohol use: Yes    Alcohol/week: 5.0 standard drinks    Types: 5 Glasses of wine per week   Drug use: No   Sexual activity: Yes  Other Topics Concern   Not on file  Social History Narrative   Not on file   Social Determinants of Health   Financial Resource Strain: Not on file  Food Insecurity: Not on file  Transportation Needs: Not on file  Physical Activity: Not on file  Stress: Not on file  Social  Connections: Not on file  Intimate Partner Violence: Not on file     BP 108/74   Pulse 60   Ht 5' 11"  (1.803 m)   Wt 232 lb (105.2 kg)   SpO2 98%   BMI 32.36 kg/m   Physical Exam:  Well appearing NAD HEENT: Unremarkable Neck:  No JVD, no thyromegally Lymphatics:  No adenopathy Back:  No CVA tenderness Lungs:  Clear with no wheezes HEART:  Regular rate rhythm, no murmurs, no rubs, no clicks Abd:  soft, positive bowel sounds, no organomegally, no rebound, no guarding Ext:  2 plus pulses, no edema, no cyanosis, no clubbing Skin:  No rashes no nodules Neuro:  CN II through XII intact, motor grossly intact  EKG - NSR with Nonspecific STT changes.   DEVICE  Normal device function.  See PaceArt for details.   Assess/Plan:  1. Palpitations - These appear to be stable. No change in meds.  2. HTN - he is encouraged to lose weight and to continue his beta blocker. His bp is good today 3. Ascending aortic aneurysm - A repeat CT scan in January demonstrated a 4.2 cm aorta.   4. SVT/Atrial flutter - he is s/p ablation of typical atrial flutter as well as a para hisian atrial tachycardia with no sustained recurrent SVT 5. Sob with an abnormal ECG - the concern is silent ischemia manifested by dyspnea. I have discussed the treatment options in detail from watchful waiting to an exercise myoview and we will proceed with the myoview. With his abnormal ECG, and intermediate preoperative risk, he will require a perfusion study.   Lee Lee,M.D.

## 2021-06-24 ENCOUNTER — Telehealth (HOSPITAL_COMMUNITY): Payer: Self-pay | Admitting: *Deleted

## 2021-06-24 ENCOUNTER — Telehealth: Payer: Self-pay

## 2021-06-24 DIAGNOSIS — R0609 Other forms of dyspnea: Secondary | ICD-10-CM

## 2021-06-24 DIAGNOSIS — R0600 Dyspnea, unspecified: Secondary | ICD-10-CM

## 2021-06-24 NOTE — Telephone Encounter (Signed)
Patient given detailed instructions per Myocardial Perfusion Study Information Sheet for the test on 07/01/21 Patient notified to arrive 15 minutes early and that it is imperative to arrive on time for appointment to keep from having the test rescheduled.  If you need to cancel or reschedule your appointment, please call the office within 24 hours of your appointment. . Patient verbalized understanding. Kirstie Peri

## 2021-06-24 NOTE — Telephone Encounter (Signed)
Signed.

## 2021-07-01 ENCOUNTER — Other Ambulatory Visit: Payer: Self-pay

## 2021-07-01 ENCOUNTER — Ambulatory Visit (HOSPITAL_COMMUNITY): Payer: PPO | Attending: Cardiology

## 2021-07-01 DIAGNOSIS — R9431 Abnormal electrocardiogram [ECG] [EKG]: Secondary | ICD-10-CM | POA: Diagnosis not present

## 2021-07-01 DIAGNOSIS — R0600 Dyspnea, unspecified: Secondary | ICD-10-CM | POA: Diagnosis not present

## 2021-07-01 DIAGNOSIS — R0609 Other forms of dyspnea: Secondary | ICD-10-CM

## 2021-07-01 LAB — MYOCARDIAL PERFUSION IMAGING
Estimated workload: 7 METS
Exercise duration (min): 5 min
Exercise duration (sec): 45 s
LV dias vol: 91 mL (ref 62–150)
LV sys vol: 39 mL
MPHR: 152 {beats}/min
Peak HR: 141 {beats}/min
Percent HR: 92 %
Rest HR: 78 {beats}/min
SDS: 0
SRS: 0
SSS: 0
TID: 1.05

## 2021-07-01 MED ORDER — TECHNETIUM TC 99M TETROFOSMIN IV KIT
30.9000 | PACK | Freq: Once | INTRAVENOUS | Status: AC | PRN
  Administered 2021-07-01: 30.9 via INTRAVENOUS
  Filled 2021-07-01: qty 31

## 2021-07-01 MED ORDER — TECHNETIUM TC 99M TETROFOSMIN IV KIT
9.9000 | PACK | Freq: Once | INTRAVENOUS | Status: AC | PRN
  Administered 2021-07-01: 9.9 via INTRAVENOUS
  Filled 2021-07-01: qty 10

## 2021-07-13 ENCOUNTER — Telehealth: Payer: Self-pay | Admitting: Cardiology

## 2021-07-13 NOTE — Telephone Encounter (Signed)
Patient scheduled for OV on 07/17/21

## 2021-07-17 ENCOUNTER — Other Ambulatory Visit: Payer: Self-pay

## 2021-07-17 ENCOUNTER — Encounter: Payer: Self-pay | Admitting: Cardiology

## 2021-07-17 ENCOUNTER — Ambulatory Visit: Payer: PPO | Admitting: Cardiology

## 2021-07-17 VITALS — BP 101/66 | HR 70 | Temp 98.7°F | Resp 16 | Ht 71.0 in | Wt 229.8 lb

## 2021-07-17 DIAGNOSIS — I10 Essential (primary) hypertension: Secondary | ICD-10-CM

## 2021-07-17 DIAGNOSIS — I9589 Other hypotension: Secondary | ICD-10-CM | POA: Diagnosis not present

## 2021-07-17 DIAGNOSIS — I700 Atherosclerosis of aorta: Secondary | ICD-10-CM

## 2021-07-17 DIAGNOSIS — I712 Thoracic aortic aneurysm, without rupture, unspecified: Secondary | ICD-10-CM

## 2021-07-17 DIAGNOSIS — R0600 Dyspnea, unspecified: Secondary | ICD-10-CM

## 2021-07-17 DIAGNOSIS — Z9189 Other specified personal risk factors, not elsewhere classified: Secondary | ICD-10-CM

## 2021-07-17 DIAGNOSIS — R0609 Other forms of dyspnea: Secondary | ICD-10-CM | POA: Diagnosis not present

## 2021-07-17 MED ORDER — MIDODRINE HCL 10 MG PO TABS: 10.0000 mg | ORAL_TABLET | Freq: Three times a day (TID) | ORAL | 1 refills | Status: DC | PRN

## 2021-07-17 MED ORDER — ROSUVASTATIN CALCIUM 5 MG PO TABS
5.0000 mg | ORAL_TABLET | Freq: Every day | ORAL | 2 refills | Status: DC
Start: 2021-07-17 — End: 2021-08-21

## 2021-07-17 NOTE — Progress Notes (Signed)
Primary Physician/Referring:  Janith Lima, MD  Patient ID: Dimple Nanas, male    DOB: 1951-03-05, 70 y.o.   MRN: [id number]  Chief Complaint  Patient presents with   New Patient (Initial Visit)   Shortness of Breath   Loss of Consciousness   HPI:    KNOX CERVI  is a 70 y.o. 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  Past medical history significant for hypertension, thoracic aortic aneurysm, depression since he retired.  He made an appointment to see me and to establish care, previously evaluated by Dr. Crissie Sickles, recently noted to have abnormal EKG, has been having worsening dyspnea on exertion over the past 6 months and frequent episodes of severe dizziness and also syncope that started about 3 to 4 months ago.  Patient's symptoms started approximately when he started Pristiq for depression.  Patient states that he has had 3 complete loss of consciousness when he had tried to suddenly stand up.  He feels dizzy, flashing lights followed by syncope.  Majority of the times he is able to avoid passing out by sitting down quickly.  No other associated symptoms.  Is also noticed gradually worsening dyspnea on exertion for the past 6 months, no PND or orthopnea.  He does golf at least 3-4 times a week and he has noticed marked decrease in endurance especially with regard to dyspnea.  Denies leg edema, no symptoms of claudication or chest pain or palpitations.  Past Medical History:  Diagnosis Date   Alcohol abuse, episodic drinking behavior    Anxiety    Arthritis    back & knees   Atrial flutter (HCC)    s/p RFCA 01/05/13   Calcium oxalate renal stones    Complication of anesthesia    makes him loopy   Depression    ED (erectile dysfunction)    Hemorrhoids    Hypertension    Past  Surgical History:  Procedure Laterality Date   ABLATION OF DYSRHYTHMIC FOCUS  01/05/2013   ARTHROSCOPIC REPAIR ACL     ATRIAL FLUTTER ABLATION N/A 01/05/2013   Procedure: ATRIAL FLUTTER ABLATION;  Surgeon: Evans Lance, MD;  Location: [facility] CATH LAB;  Service: Cardiovascular;  Laterality: N/A;   COLONOSCOPY  11/2009   Dr. Benson Norway   ELECTROPHYSIOLOGIC STUDY N/A 07/04/2015   Procedure: A-Flutter Ablation;  Surgeon: Evans Lance, MD;  Location: [facility] LAB;  Service: Cardiovascular;  Laterality: N/A;   ROTATOR CUFF REPAIR     TOTAL KNEE ARTHROPLASTY Left 10/30/2019   Procedure: TOTAL KNEE ARTHROPLASTY;  Surgeon: Paralee Cancel, MD;  Location: [facility] ORS;  Service: Orthopedics;  Laterality: Left;  70 mins   Family History  Problem Relation Age of Onset   Valvular heart disease Mother    Lymphoma Mother     Social History   Tobacco Use   Smoking status: Former    Types: Cigars    Quit date: 2002    Years since quitting: 20.6   Smokeless tobacco: Current    Types: Chew   Tobacco comments:    chews tobacco when playing golf only  Substance Use Topics   Alcohol use: Yes    Alcohol/week: 5.0 standard drinks    Types: 5 Glasses of wine per week    Comment: 3-4 Glasses of wine a week  Marital Status: Married  ROS  Review of Systems  Cardiovascular:  Positive for dyspnea on exertion and syncope. Negative for chest pain and leg swelling.  Gastrointestinal:  Negative for melena.  Neurological:  Positive for dizziness.  Psychiatric/Behavioral:  Positive for depression.   Objective  Blood pressure 101/66, pulse 70, temperature 98.7 F (37.1 C), temperature source Temporal, resp. rate 16, height 5' 11"  (1.803 m), weight 229 lb 12.8 oz (104.2 kg), SpO2 95 %. Body mass index is 32.05 kg/m.  Vitals with BMI 07/17/2021 06/19/2021 05/21/2021  Height 5' 11"  5' 11"  -  Weight 229 lbs 13 oz 232 lbs -  BMI 54.62 70.35 -  Systolic 009 381 [id number]  Diastolic 66 74 78  Pulse 70 60 62  Some encounter  information is confidential and restricted. Go to Review Flowsheets activity to see all data.    Orthostatic VS for the past 72 hrs (Last 3 readings):  Orthostatic BP Patient Position BP Location Cuff Size Orthostatic Pulse  07/17/21 0926 95/59 Standing Left Arm Large 71  07/17/21 0925 106/75 Sitting Left Arm Large 77  07/17/21 0924 120/80 Supine Left Arm Large 67    Physical Exam Constitutional:      Appearance: He is obese.  Neck:     Vascular: No carotid bruit or JVD.  Cardiovascular:     Rate and Rhythm: Normal rate and regular rhythm.     Pulses: Intact distal pulses.     Heart sounds: Normal heart sounds. No murmur heard.   No gallop.  Pulmonary:     Effort: Pulmonary effort is normal.     Breath sounds: Normal breath sounds.  Abdominal:     General: Bowel sounds are normal.     Palpations: Abdomen is soft.  Musculoskeletal:        General: No swelling.     Laboratory examination:   Recent Labs    12/11/20 0853 12/25/20 0920 05/19/21 1606 05/20/21 2141  NA 144 138 138 140  K 4.4 4.3 3.5 3.5  CL 100 101 100 100  CO2 23 30 30 28   GLUCOSE 98 106* 93 89  BUN 14 10 17 16   CREATININE 0.80 0.80 0.93 0.90  CALCIUM 9.7 9.3 9.4 9.1  GFRNONAA 92  --   --  >60  GFRAA 106  --   --   --    CrCl cannot be calculated (Patient's most recent lab result is older than the maximum 21 days allowed.).  CMP Latest Ref Rng & Units 05/20/2021 05/19/2021 12/25/2020  Glucose 70 - 99 mg/dL 89 93 106(H)  BUN 8 - 23 mg/dL 16 17 10   Creatinine 0.61 - 1.24 mg/dL 0.90 0.93 0.80  Sodium 135 - 145 mmol/L 140 138 138  Potassium 3.5 - 5.1 mmol/L 3.5 3.5 4.3  Chloride 98 - 111 mmol/L 100 100 101  CO2 22 - 32 mmol/L 28 30 30   Calcium 8.9 - 10.3 mg/dL 9.1 9.4 9.3  Total Protein 6.0 - 8.3 g/dL - 7.0 7.0  Total Bilirubin 0.2 - 1.2 mg/dL - 0.4 0.9  Alkaline Phos 39 - 117 U/L - 66 63  AST 0 - 37 U/L - 48(H) 47(H)  ALT 0 - 53 U/L - 37 37   CBC Latest Ref Rng & Units 05/20/2021 05/19/2021 12/25/2020   WBC 4.0 - 10.5 K/uL 6.8 7.1 6.2  Hemoglobin 13.0 - 17.0 g/dL 11.0(L) 11.4(L) 14.3  Hematocrit 39.0 - 52.0 % 32.5(L) 32.6(L) 41.1  Platelets 150 - 400 K/uL 147(L) 162.0  126.0(L)    Lipid Panel Recent Labs    12/25/20 0920  CHOL 179  TRIG 100.0  LDLCALC 90  VLDL 20.0  HDL 69.40  CHOLHDL 3   Lipid Panel     Component Value Date/Time   CHOL 179 12/25/2020 0920   TRIG 100.0 12/25/2020 0920   HDL 69.40 12/25/2020 0920   CHOLHDL 3 12/25/2020 0920   VLDL 20.0 12/25/2020 0920   LDLCALC 90 12/25/2020 0920   LDLDIRECT 101.0 12/26/2018 1342     HEMOGLOBIN A1C Lab Results  Component Value Date   HGBA1C 4.4 (L) 12/24/2019   TSH Recent Labs    12/25/20 0920 05/19/21 1606  TSH 1.56 1.21   Medications and allergies  No Known Allergies   Medication prior to this encounter:   Outpatient Medications Prior to Visit  Medication Sig Dispense Refill   ARIPiprazole (ABILIFY) 2 MG tablet TAKE ONE TABLET BY MOUTH DAILY 90 tablet 0   b complex vitamins capsule Take 1 capsule by mouth daily.     busPIRone (BUSPAR) 15 MG tablet Take one tablet twice daily. 180 tablet 1   carvedilol (COREG) 12.5 MG tablet TAKE ONE TABLET BY MOUTH TWICE A DAY WITH A MEAL (Patient taking differently: Take 12.5 mg by mouth daily. TAKE ONE TABLET BY MOUTH ONCE A DAY WITH A MEAL) 180 tablet 2   desvenlafaxine (PRISTIQ) 50 MG 24 hr tablet Take 1 tablet (50 mg total) by mouth daily. 90 tablet 1   gabapentin (NEURONTIN) 600 MG tablet Take 1 tablet (600 mg total) by mouth at bedtime. 90 tablet 1   irbesartan (AVAPRO) 300 MG tablet Take 1 tablet (300 mg total) by mouth daily. 90 tablet 2   thiamine (VITAMIN B-1) 50 MG tablet Take 1 tablet (50 mg total) by mouth daily. 90 tablet 1   traZODone (DESYREL) 50 MG tablet TAKE 1 TO 2 TABLETS BY MOUTH AT BEDTIME 180 tablet 1   cyanocobalamin 2000 MCG tablet Take 1 tablet (2,000 mcg total) by mouth daily. 90 tablet 1   Pitavastatin Calcium (LIVALO) 2 MG TABS Take 1 tablet  (2 mg total) by mouth daily. (Patient not taking: Reported on 07/17/2021) 90 tablet 1   No facility-administered medications prior to visit.    Medication list after today's encounter   Current Outpatient Medications  Medication Instructions   ARIPiprazole (ABILIFY) 2 MG tablet TAKE ONE TABLET BY MOUTH DAILY   b complex vitamins capsule 1 capsule, Oral, Daily   busPIRone (BUSPAR) 15 MG tablet Take one tablet twice daily.   carvedilol (COREG) 12.5 MG tablet TAKE ONE TABLET BY MOUTH TWICE A DAY WITH A MEAL   desvenlafaxine (PRISTIQ) 50 mg, Oral, Daily   gabapentin (NEURONTIN) 600 mg, Oral, Daily at bedtime   irbesartan (AVAPRO) 300 mg, Oral, Daily   midodrine (PROAMATINE) 10 mg, Oral, 3 times daily PRN   rosuvastatin (CRESTOR) 5 mg, Oral, Daily   thiamine (VITAMIN B-1) 50 mg, Oral, Daily   traZODone (DESYREL) 50 MG tablet TAKE 1 TO 2 TABLETS BY MOUTH AT BEDTIME   Radiology:   CT angiogram of the chest aorta with and without contrast 12/17/2020, compared to 12/28/2019. 1. No acute abnormality. Stable mild dilation of the ascending aorta up to 4.2 cm. Mild calcific atherosclerosis of the aorta. Recommend annual imaging followup by CTA or MRA. This recommendation follows 2010 ACCF/AHA/AATS/ACR/ASA/SCA/SCAI/SIR/STS/SVM Guidelines for the Diagnosis and Management of Patients with Thoracic Aortic Disease. Circulation. 2010; 121: P591-M384. Aortic aneurysm NOS (ICD10-I71.9) 2. Hepatic steatosis.  Cardiac Studies:   Atrial flutter ablation 2014 & 2016 Cristopher Peru, MD  Echocardiogram 07/17/2019:  1. The left ventricle has normal systolic function with an ejection fraction of 60-65%. The cavity size was normal. Left ventricular diastolic Doppler parameters are consistent with pseudonormalization.  2. The right ventricle has normal systolic function. The cavity was normal. There is no increase in right ventricular wall thickness.  3. Left atrial size was mildly dilated.  4. There is mild mitral  annular calcification present.  5. There is mild dilatation of the ascending aorta measuring 44 mm.  Treadmill nuclear stress test 07/01/2021: Fair exercise capacity, achieved 7 METS Peak HR 141 bpm, 92% max predicted heart rate. No EKG evidence of ischemia The left ventricular ejection fraction is normal (55-65%). Nuclear stress EF: 57%. The study is normal. This is a low risk study.  No significant change from 07/01/2020.   EKG:   EKG 07/17/2021: Normal sinus rhythm at the rate of 66 bpm, left atrial enlargement, normal axis.  Incomplete right bundle branch block.  T wave abnormality, anterolateral ischemia.  Compared to EKG 06/19/2021, no significant change.  These changes are new compared to 2021 EKG.    Assessment     ICD-10-CM   1. Other specified hypotension  I95.89     2. Dyspnea on exertion  R06.00 PCV ECHOCARDIOGRAM COMPLETE    3. At risk for polypharmacy  Z91.89     4. Thoracic aortic aneurysm without rupture (HCC)  I71.2     5. Essential hypertension  I10 EKG 12-Lead    6. Aortic atherosclerosis (HCC)  I70.0 rosuvastatin (CRESTOR) 5 MG tablet    Lipid Panel With LDL/HDL Ratio    Lipid Panel With LDL/HDL Ratio       Medications Discontinued During This Encounter  Medication Reason   cyanocobalamin 2000 MCG tablet Error   Pitavastatin Calcium (LIVALO) 2 MG TABS Patient has not taken in last 30 days    Meds ordered this encounter  Medications   midodrine (PROAMATINE) 10 MG tablet    Sig: Take 1 tablet (10 mg total) by mouth 3 (three) times daily as needed (While awake and on your feet to prevent dizziness).    Dispense:  90 tablet    Refill:  1   rosuvastatin (CRESTOR) 5 MG tablet    Sig: Take 1 tablet (5 mg total) by mouth daily.    Dispense:  30 tablet    Refill:  2   Orders Placed This Encounter  Procedures   Lipid Panel With LDL/HDL Ratio    Standing Status:   Future    Number of Occurrences:   1    Standing Expiration Date:   07/17/2022   EKG 12-Lead    PCV ECHOCARDIOGRAM COMPLETE    Standing Status:   Future    Standing Expiration Date:   07/17/2022   Recommendations:   KAHLIN MARK is a 70 y.o. 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  Past medical history significant for hypertension, thoracic aortic aneurysm, depression since he retired.  He made an appointment to see me and to establish care, previously evaluated by Dr. Crissie Sickles, recently noted to have abnormal EKG, has been having worsening dyspnea on exertion over the past 6 months and frequent episodes of severe dizziness and also syncope that started about 3  to 4 months ago.  Patient's symptoms started approximately when he started Pristiq for depression.  He is severely orthostatic hypotensive.  His etiology for orthostasis is most probably related to polypharmacy with regard to depression medications and also gabapentin contributing to this.  Reports of Pristiq causing pulmonary fibrosis is always a concern although recent CT scan although not high resolution did not reveal any scarring and it is reassuring.  Advised him to use support stockings for now, discussed fall precautions and added midodrine to be taken on a as needed basis.  Regard to dyspnea on exertion, with new EKG abnormality with T wave inversion in the anterolateral leads new from 2021 although 2 nuclear stress test in 21 and again in 2022 were negative for myocardial ischemia, could consider right and left heart catheterization.  Whether these EKG abnormalities represent RV strain is yet to be determined.  He also has aortic atherosclerosis by CT scan and although lipids are not elevated, will proceed with starting him on Crestor 5 mg daily for cardiovascular protection.  Patient was prescribed Livalo in the past by medical record  evaluation, however patient never picked up the prescription.    Complete abstinence from chewing tobacco discussed.  I will schedule him for an echocardiogram to evaluate for pulmonary hypertension and diastolic function.  Dyspnea may also be related to obesity, deconditioning as well.  Reviewed his aortic aneurysm studies, stable size, continue surveillance.  He is presently on appropriate Avapro with regard to aortic aneurysm, continue the same.  For hypertension he is also on carvedilol and blood pressure is well controlled, continue same.  I would like to see him back in 6 weeks for follow-up, we will repeat lipids prior to his office visit.  This was a >60-minute office visit encounter.    Adrian Prows, MD, [facility] 07/17/2021, 12:15 PM Office: [phone]

## 2021-07-19 ENCOUNTER — Other Ambulatory Visit: Payer: Self-pay | Admitting: Internal Medicine

## 2021-07-19 DIAGNOSIS — F332 Major depressive disorder, recurrent severe without psychotic features: Secondary | ICD-10-CM

## 2021-07-19 DIAGNOSIS — G2581 Restless legs syndrome: Secondary | ICD-10-CM

## 2021-07-19 MED ORDER — DESVENLAFAXINE SUCCINATE ER 25 MG PO TB24: 1.0000 | ORAL_TABLET | Freq: Every day | ORAL | 0 refills | Status: DC

## 2021-07-19 MED ORDER — GABAPENTIN 300 MG PO CAPS: 300.0000 mg | ORAL_CAPSULE | Freq: Every day | ORAL | 0 refills | Status: DC

## 2021-07-23 ENCOUNTER — Ambulatory Visit: Payer: PPO

## 2021-07-23 ENCOUNTER — Other Ambulatory Visit: Payer: Self-pay

## 2021-07-23 DIAGNOSIS — R0609 Other forms of dyspnea: Secondary | ICD-10-CM

## 2021-07-23 DIAGNOSIS — R0600 Dyspnea, unspecified: Secondary | ICD-10-CM

## 2021-07-27 NOTE — Telephone Encounter (Signed)
From pt

## 2021-07-29 ENCOUNTER — Other Ambulatory Visit: Payer: Self-pay | Admitting: Adult Health

## 2021-07-29 DIAGNOSIS — G4700 Insomnia, unspecified: Secondary | ICD-10-CM

## 2021-08-17 DIAGNOSIS — I700 Atherosclerosis of aorta: Secondary | ICD-10-CM | POA: Diagnosis not present

## 2021-08-18 LAB — LIPID PANEL WITH LDL/HDL RATIO
Cholesterol, Total: 150 mg/dL (ref 100–199)
HDL: 73 mg/dL (ref >39)
LDL Chol Calc (NIH): 63 mg/dL (ref 0–99)
LDL/HDL Ratio: 0.9 ratio (ref 0.0–3.6)
Triglycerides: 71 mg/dL (ref 0–149)
VLDL Cholesterol Cal: 14 mg/dL (ref 5–40)

## 2021-08-21 ENCOUNTER — Ambulatory Visit: Payer: PPO | Admitting: Cardiology

## 2021-08-21 ENCOUNTER — Encounter: Payer: Self-pay | Admitting: Cardiology

## 2021-08-21 ENCOUNTER — Other Ambulatory Visit: Payer: Self-pay

## 2021-08-21 VITALS — BP 117/74 | HR 74 | Temp 97.9°F | Resp 17 | Ht 71.0 in | Wt 227.4 lb

## 2021-08-21 DIAGNOSIS — I951 Orthostatic hypotension: Secondary | ICD-10-CM | POA: Diagnosis not present

## 2021-08-21 DIAGNOSIS — I700 Atherosclerosis of aorta: Secondary | ICD-10-CM | POA: Diagnosis not present

## 2021-08-21 DIAGNOSIS — I712 Thoracic aortic aneurysm, without rupture, unspecified: Secondary | ICD-10-CM

## 2021-08-21 DIAGNOSIS — I10 Essential (primary) hypertension: Secondary | ICD-10-CM

## 2021-08-21 MED ORDER — MIDODRINE HCL 10 MG PO TABS: 10.0000 mg | ORAL_TABLET | Freq: Three times a day (TID) | ORAL | 3 refills | Status: DC | PRN

## 2021-08-21 MED ORDER — ROSUVASTATIN CALCIUM 5 MG PO TABS: 5.0000 mg | ORAL_TABLET | Freq: Every day | ORAL | 3 refills | Status: DC

## 2021-08-21 NOTE — Progress Notes (Signed)
Primary Physician/Referring:  Janith Lima, MD  Patient ID: Lee Lee, male    DOB: 1951/04/26, 70 y.o.   MRN: [id number]  Chief Complaint  Patient presents with   orthostatic hypotension   Follow-up    6 weeks   supine hypertension   Hyperlipidemia   Shortness of Breath   HPI:    Lee Lee  is a 70 y.o. 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  Past medical history significant for hypertension, thoracic aortic aneurysm, depression since he retired. He ws evaluated by me 6-8 weeks ago for worsening dyspnea on exertion over the past 6 months and frequent episodes of severe dizziness and also syncope that started about 3 to 4 months ago.  I started him on midodrine, had advised him to reduce dose of psychotropic agents that he is on.  Fortunately with midodrine, his symptoms of dizziness and near syncope has completely resolved.  Chronic dyspnea is stable and in fact he has started to feel better with weight loss and also with decreased dizziness, he has been more physically active and has lost some weight.  No chest pain.   Past Medical History:  Diagnosis Date   Alcohol abuse, episodic drinking behavior    Anxiety    Arthritis    back & knees   Atrial flutter (HCC)    s/p RFCA 01/05/13   Calcium oxalate renal stones    Complication of anesthesia    makes him loopy   Depression    ED (erectile dysfunction)    Hemorrhoids    Hypertension    Past Surgical History:  Procedure Laterality Date   ABLATION OF DYSRHYTHMIC FOCUS  01/05/2013   ARTHROSCOPIC REPAIR ACL     ATRIAL FLUTTER ABLATION N/A 01/05/2013   Procedure: ATRIAL FLUTTER ABLATION;  Surgeon: Evans Lance, MD;  Location: [facility] CATH LAB;  Service: Cardiovascular;  Laterality: N/A;   COLONOSCOPY  11/2009   Dr. Benson Norway    ELECTROPHYSIOLOGIC STUDY N/A 07/04/2015   Procedure: A-Flutter Ablation;  Surgeon: Evans Lance, MD;  Location: [place] CV LAB;  Service: Cardiovascular;  Laterality: N/A;   ROTATOR CUFF REPAIR     TOTAL KNEE ARTHROPLASTY Left 10/30/2019   Procedure: TOTAL KNEE ARTHROPLASTY;  Surgeon: Paralee Cancel, MD;  Location: [facility] ORS;  Service: Orthopedics;  Laterality: Left;  70 mins   Family History  Problem Relation Age of Onset   Valvular heart disease Mother    Lymphoma Mother     Social History   Tobacco Use   Smoking status: Former    Types: Cigars    Quit date: 2002    Years since quitting: 20.7   Smokeless tobacco: Current    Types: Chew   Tobacco comments:    chews tobacco when playing golf only  Substance Use Topics   Alcohol use: Yes    Alcohol/week: 5.0 standard drinks    Types: 5 Glasses of wine per week    Comment: 3-4 Glasses of wine a week   Marital Status: Married  ROS  Review of Systems  Cardiovascular:  Positive for dyspnea on exertion. Negative for chest pain, leg swelling and syncope.  Gastrointestinal:  Negative for melena.  Neurological:  Negative for dizziness.  Psychiatric/Behavioral:  Positive for depression.   Objective  Blood pressure 117/74, pulse 74, temperature  97.9 F (36.6 C), temperature source Temporal, resp. rate 17, height 5' 11"  (1.803 m), weight 227 lb 6.4 oz (103.1 kg), SpO2 98 %. Body mass index is 31.72 kg/m.  Vitals with BMI 08/21/2021 07/17/2021 06/19/2021  Height 5' 11"  5' 11"  5' 11"   Weight 227 lbs 6 oz 229 lbs 13 oz 232 lbs  BMI 31.73 91.69 45.03  Systolic 888 280 034  Diastolic 74 66 74  Pulse 74 70 60  Some encounter information is confidential and restricted. Go to Review Flowsheets activity to see all data.    Orthostatic VS for the past 72 hrs (Last 3 readings):  Orthostatic BP Patient Position BP Location Cuff Size Orthostatic Pulse  08/21/21 1427 105/64 Standing Left Arm Normal 72  08/21/21 1426 119/72 Sitting Left Arm  Normal 58  08/21/21 1425 123/72 Supine Left Arm Normal 64   07/17/21 0926 95/59 Standing Left Arm Large 71  07/17/21 0925 106/75 Sitting Left Arm Large 77  07/17/21 0924 120/80 Supine Left Arm Large 67     Physical Exam Constitutional:      Appearance: He is obese.  Neck:     Vascular: No carotid bruit or JVD.  Cardiovascular:     Rate and Rhythm: Normal rate and regular rhythm.     Pulses: Intact distal pulses.     Heart sounds: Normal heart sounds. No murmur heard.   No gallop.  Pulmonary:     Effort: Pulmonary effort is normal.     Breath sounds: Normal breath sounds.  Abdominal:     General: Bowel sounds are normal.     Palpations: Abdomen is soft.  Musculoskeletal:        General: No swelling.     Laboratory examination:   Recent Labs    12/11/20 0853 12/25/20 0920 05/19/21 1606 05/20/21 2141  NA 144 138 138 140  K 4.4 4.3 3.5 3.5  CL 100 101 100 100  CO2 23 30 30 28   GLUCOSE 98 106* 93 89  BUN 14 10 17 16   CREATININE 0.80 0.80 0.93 0.90  CALCIUM 9.7 9.3 9.4 9.1  GFRNONAA 92  --   --  >60  GFRAA 106  --   --   --    CrCl cannot be calculated (Patient's most recent lab result is older than the maximum 21 days allowed.).  CMP Latest Ref Rng & Units 05/20/2021 05/19/2021 12/25/2020  Glucose 70 - 99 mg/dL 89 93 106(H)  BUN 8 - 23 mg/dL 16 17 10   Creatinine 0.61 - 1.24 mg/dL 0.90 0.93 0.80  Sodium 135 - 145 mmol/L 140 138 138  Potassium 3.5 - 5.1 mmol/L 3.5 3.5 4.3  Chloride 98 - 111 mmol/L 100 100 101  CO2 22 - 32 mmol/L 28 30 30   Calcium 8.9 - 10.3 mg/dL 9.1 9.4 9.3  Total Protein 6.0 - 8.3 g/dL - 7.0 7.0  Total Bilirubin 0.2 - 1.2 mg/dL - 0.4 0.9  Alkaline Phos 39 - 117 U/L - 66 63  AST 0 - 37 U/L - 48(H) 47(H)  ALT 0 - 53 U/L - 37 37   CBC Latest Ref Rng & Units 05/20/2021 05/19/2021 12/25/2020  WBC 4.0 - 10.5 K/uL 6.8 7.1 6.2  Hemoglobin 13.0 - 17.0 g/dL 11.0(L) 11.4(L) 14.3  Hematocrit 39.0 - 52.0 % 32.5(L) 32.6(L) 41.1  Platelets 150 - 400 K/uL  147(L) 162.0 126.0(L)    Lipid Panel Recent Labs    12/25/20 0920 08/17/21 0833  CHOL 179 150  TRIG  100.0 71  LDLCALC 90 63  VLDL 20.0  --   HDL 69.40 73  CHOLHDL 3  --    Lipid Panel     Component Value Date/Time   CHOL 150 08/17/2021 0833   TRIG 71 08/17/2021 0833   HDL 73 08/17/2021 0833   CHOLHDL 3 12/25/2020 0920   VLDL 20.0 12/25/2020 0920   LDLCALC 63 08/17/2021 0833   LDLDIRECT 101.0 12/26/2018 1342   LABVLDL 14 08/17/2021 0833     HEMOGLOBIN A1C Lab Results  Component Value Date   HGBA1C 4.4 (L) 12/24/2019   TSH Recent Labs    12/25/20 0920 05/19/21 1606  TSH 1.56 1.21   Medications and allergies  No Known Allergies   Medication prior to this encounter:   Outpatient Medications Prior to Visit  Medication Sig Dispense Refill   b complex vitamins capsule Take 1 capsule by mouth daily.     busPIRone (BUSPAR) 15 MG tablet Take one tablet twice daily. 180 tablet 1   carvedilol (COREG) 12.5 MG tablet TAKE ONE TABLET BY MOUTH TWICE A DAY WITH A MEAL (Patient taking differently: Take 12.5 mg by mouth daily. TAKE ONE TABLET BY MOUTH ONCE A DAY WITH A MEAL) 180 tablet 2   desvenlafaxine (PRISTIQ) 50 MG 24 hr tablet Take 50 mg by mouth daily.     gabapentin (NEURONTIN) 300 MG capsule Take 1 capsule (300 mg total) by mouth at bedtime. (Patient taking differently: Take 600 mg by mouth at bedtime.) 90 capsule 0   irbesartan (AVAPRO) 300 MG tablet Take 1 tablet (300 mg total) by mouth daily. 90 tablet 2   thiamine (VITAMIN B-1) 50 MG tablet Take 1 tablet (50 mg total) by mouth daily. 90 tablet 1   traZODone (DESYREL) 50 MG tablet TAKE 1 TO 2 TABLETS BY MOUTH AT BEDTIME 180 tablet 1   midodrine (PROAMATINE) 10 MG tablet Take 1 tablet (10 mg total) by mouth 3 (three) times daily as needed (While awake and on your feet to prevent dizziness). 90 tablet 1   rosuvastatin (CRESTOR) 5 MG tablet Take 1 tablet (5 mg total) by mouth daily. 30 tablet 2   ARIPiprazole (ABILIFY)  2 MG tablet TAKE ONE TABLET BY MOUTH DAILY 90 tablet 0   Desvenlafaxine Succinate ER (PRISTIQ) 25 MG TB24 Take 25 mg by mouth daily. 90 tablet 0   No facility-administered medications prior to visit.    Medication list after today's encounter   Current Outpatient Medications  Medication Instructions   b complex vitamins capsule 1 capsule, Oral, Daily   busPIRone (BUSPAR) 15 MG tablet Take one tablet twice daily.   carvedilol (COREG) 12.5 MG tablet TAKE ONE TABLET BY MOUTH TWICE A DAY WITH A MEAL   desvenlafaxine (PRISTIQ) 50 mg, Oral, Daily   gabapentin (NEURONTIN) 300 mg, Oral, Daily at bedtime   irbesartan (AVAPRO) 300 mg, Oral, Daily   midodrine (PROAMATINE) 10 mg, Oral, 3 times daily PRN   rosuvastatin (CRESTOR) 5 mg, Oral, Daily   thiamine (VITAMIN B-1) 50 mg, Oral, Daily   traZODone (DESYREL) 50 MG tablet TAKE 1 TO 2 TABLETS BY MOUTH AT BEDTIME   Radiology:   CT angiogram of the chest aorta with and without contrast 12/17/2020, compared to 12/28/2019. 1. No acute abnormality. Stable mild dilation of the ascending aorta up to 4.2 cm. Mild calcific atherosclerosis of the aorta. Recommend annual imaging followup by CTA or MRA. This recommendation follows 2010 ACCF/AHA/AATS/ACR/ASA/SCA/SCAI/SIR/STS/SVM Guidelines for the Diagnosis and Management of Patients  with Thoracic Aortic Disease. Circulation. 2010; 121: E174-Y814. Aortic aneurysm NOS (ICD10-I71.9) 2. Hepatic steatosis.  Cardiac Studies:   Atrial flutter ablation 2014 & 2016 Lee Peru, MD  Treadmill nuclear stress test 07/01/2021: Fair exercise capacity, achieved 7 METS Peak HR 141 bpm, 92% max predicted heart rate. No EKG evidence of ischemia The left ventricular ejection fraction is normal (55-65%). Nuclear stress EF: 57%. The study is normal. This is a low risk study.  No significant change from 07/01/2020.   PCV ECHOCARDIOGRAM COMPLETE 07/23/2021  Narrative Echocardiogram 07/23/2021: Normal LV systolic function  with visual EF 60-65%. Left ventricle cavity is normal in size. Moderate left ventricular hypertrophy. Normal global wall motion. Normal diastolic filling pattern, normal LAP. Mild tricuspid regurgitation. The aortic root is normal. Mildly dilated proximal  ascending aorta 38m. Compared to study 07/17/2019 no significant change, ascending aorta was 463mand now 4349m  EKG:   EKG 07/17/2021: Normal sinus rhythm at the rate of 66 bpm, left atrial enlargement, normal axis.  Incomplete right bundle branch block.  T wave abnormality, anterolateral ischemia.  Compared to EKG 06/19/2021, no significant change.  These changes are new compared to 2021 EKG.    Assessment     ICD-10-CM   1. Thoracic aortic aneurysm without rupture (HCC)  I71.2 PCV ECHOCARDIOGRAM COMPLETE    2. Essential hypertension  I10     3. Orthostatic hypotension  I95.1     4. Aortic atherosclerosis (HCC)  I70.0 rosuvastatin (CRESTOR) 5 MG tablet      Medications Discontinued During This Encounter  Medication Reason   ARIPiprazole (ABILIFY) 2 MG tablet Error   Desvenlafaxine Succinate ER (PRISTIQ) 25 MG TB24 Error   midodrine (PROAMATINE) 10 MG tablet Reorder   rosuvastatin (CRESTOR) 5 MG tablet Reorder    Meds ordered this encounter  Medications   midodrine (PROAMATINE) 10 MG tablet    Sig: Take 1 tablet (10 mg total) by mouth 3 (three) times daily as needed (While awake and on your feet to prevent dizziness).    Dispense:  240 tablet    Refill:  3   rosuvastatin (CRESTOR) 5 MG tablet    Sig: Take 1 tablet (5 mg total) by mouth daily.    Dispense:  90 tablet    Refill:  3    Orders Placed This Encounter  Procedures   PCV ECHOCARDIOGRAM COMPLETE    Standing Status:   Future    Standing Expiration Date:   08/21/2022    Recommendations:   SteROCKLAND KOTARSKI a 70 34o. Caucasian male patient with history of atrial flutter status post catheter ablation followed by repeat EP study found to have a Para-Hisian  atrial tachycardia for which he underwent repeat ablation in 2012 and 2014 respectively.  Past medical history significant for hypertension, thoracic aortic aneurysm, depression since he retired. He ws evaluated by me 6-8 weeks ago for worsening dyspnea on exertion over the past 6 months and frequent episodes of severe dizziness and also syncope that started about 3 to 4 months ago.  I started him on midodrine, had advised him to reduce dose of psychotropic agents that he is on.  Fortunately with midodrine, his symptoms of dizziness and near syncope has completely resolved, states that his energy level is increased, dyspnea has improved, he has lost about 5 pounds in weight and feels the best he has in a while.  I reviewed his echocardiogram, reassured him.  He has already had a nuclear stress test which was  negative for myocardial ischemia.  I also reviewed the aortic root size as measured by echocardiogram, excellent correlation between CTA and echocardiogram.  Hence we could avoid contrast exposure/radiation exposure, we will recheck echocardiogram in a year and I will see him back at that time.  He is now tolerating Crestor and his lipids are improved significantly.  Advised him to continue present medical therapy and I will see him back in a year.     Adrian Prows, MD, [facility] 08/22/2021, 10:59 PM Office: [phone]

## 2021-08-26 ENCOUNTER — Ambulatory Visit: Payer: PPO | Admitting: Cardiology

## 2021-08-26 ENCOUNTER — Ambulatory Visit: Payer: PPO | Admitting: Internal Medicine

## 2021-09-11 ENCOUNTER — Ambulatory Visit (INDEPENDENT_AMBULATORY_CARE_PROVIDER_SITE_OTHER): Payer: PPO

## 2021-09-11 ENCOUNTER — Other Ambulatory Visit: Payer: Self-pay

## 2021-09-11 ENCOUNTER — Other Ambulatory Visit: Payer: Self-pay | Admitting: Internal Medicine

## 2021-09-11 VITALS — BP 140/80 | HR 67 | Temp 98.2°F | Ht 71.0 in | Wt 222.0 lb

## 2021-09-11 DIAGNOSIS — G4700 Insomnia, unspecified: Secondary | ICD-10-CM

## 2021-09-11 DIAGNOSIS — Z0000 Encounter for general adult medical examination without abnormal findings: Secondary | ICD-10-CM | POA: Diagnosis not present

## 2021-09-11 MED ORDER — TRAZODONE HCL 50 MG PO TABS: 50.0000 mg | ORAL_TABLET | Freq: Every day | ORAL | 1 refills | Status: DC

## 2021-09-11 NOTE — Patient Instructions (Signed)
[name] , Thank you for taking time to come for your Medicare Wellness Visit. I appreciate your ongoing commitment to your health goals. Please review the following plan we discussed and let me know if I can assist you in the future.   Screening recommendations/referrals: Colonoscopy: done 9 months ago per patient Recommended yearly ophthalmology/optometry visit for glaucoma screening and checkup Recommended yearly dental visit for hygiene and checkup  Vaccinations: Influenza vaccine: 07/2021 Pneumococcal vaccine: 04/28/2017, 12/26/2018 Tdap vaccine: 04/28/2017; due every 10 years Shingles vaccine: 08/23/2018, 10/10/2018   Covid-19: 01/15/2020, 02/14/2020  Advanced directives: Advance directive discussed with you today. I have provided a copy for you to complete at home and have notarized. Once this is complete please bring a copy in to our office so we can scan it into your chart.  Conditions/risks identified: Yes; Client understands the importance of follow-up with providers by attending scheduled visits and discussed goals to eat healthier, increase physical activity, exercise the brain, socialize more, get enough sleep and make time for laughter.  Next appointment: Please schedule your next Medicare Wellness Visit with your Nurse Health Advisor in 1 year by calling [phone].  Preventive Care 23 Years and Older, Male Preventive care refers to lifestyle choices and visits with your health care provider that can promote health and wellness. What does preventive care include? A yearly physical exam. This is also called an annual well check. Dental exams once or twice a year. Routine eye exams. Ask your health care provider how often you should have your eyes checked. Personal lifestyle choices, including: Daily care of your teeth and gums. Regular physical activity. Eating a healthy diet. Avoiding tobacco and drug use. Limiting alcohol use. Practicing safe sex. Taking low doses of  aspirin every day. Taking vitamin and mineral supplements as recommended by your health care provider. What happens during an annual well check? The services and screenings done by your health care provider during your annual well check will depend on your age, overall health, lifestyle risk factors, and family history of disease. Counseling  Your health care provider may ask you questions about your: Alcohol use. Tobacco use. Drug use. Emotional well-being. Home and relationship well-being. Sexual activity. Eating habits. History of falls. Memory and ability to understand (cognition). Work and work Statistician. Screening  You may have the following tests or measurements: Height, weight, and BMI. Blood pressure. Lipid and cholesterol levels. These may be checked every 5 years, or more frequently if you are over 68 years old. Skin check. Lung cancer screening. You may have this screening every year starting at age 68 if you have a 30-pack-year history of smoking and currently smoke or have quit within the past 15 years. Fecal occult blood test (FOBT) of the stool. You may have this test every year starting at age 68. Flexible sigmoidoscopy or colonoscopy. You may have a sigmoidoscopy every 5 years or a colonoscopy every 10 years starting at age 68. Prostate cancer screening. Recommendations will vary depending on your family history and other risks. Hepatitis C blood test. Hepatitis B blood test. Sexually transmitted disease (STD) testing. Diabetes screening. This is done by checking your blood sugar (glucose) after you have not eaten for a while (fasting). You may have this done every 1-3 years. Abdominal aortic aneurysm (AAA) screening. You may need this if you are a current or former smoker. Osteoporosis. You may be screened starting at age 68 if you are at high risk. Talk with your health care provider about your  test results, treatment options, and if necessary, the need for more  tests. Vaccines  Your health care provider may recommend certain vaccines, such as: Influenza vaccine. This is recommended every year. Tetanus, diphtheria, and acellular pertussis (Tdap, Td) vaccine. You may need a Td booster every 10 years. Zoster vaccine. You may need this after age 68. Pneumococcal 13-valent conjugate (PCV13) vaccine. One dose is recommended after age 68. Pneumococcal polysaccharide (PPSV23) vaccine. One dose is recommended after age 68. Talk to your health care provider about which screenings and vaccines you need and how often you need them. This information is not intended to replace advice given to you by your health care provider. Make sure you discuss any questions you have with your health care provider. Document Released: 12/12/2015 Document Revised: 08/04/2016 Document Reviewed: 09/16/2015 Elsevier Interactive Patient Education  2017 [place] Prevention in the Home Falls can cause injuries. They can happen to people of all ages. There are many things you can do to make your home safe and to help prevent falls. What can I do on the outside of my home? Regularly fix the edges of walkways and driveways and fix any cracks. Remove anything that might make you trip as you walk through a door, such as a raised step or threshold. Trim any bushes or trees on the path to your home. Use bright outdoor lighting. Clear any walking paths of anything that might make someone trip, such as rocks or tools. Regularly check to see if handrails are loose or broken. Make sure that both sides of any steps have handrails. Any raised decks and porches should have guardrails on the edges. Have any leaves, snow, or ice cleared regularly. Use sand or salt on walking paths during winter. Clean up any spills in your garage right away. This includes oil or grease spills. What can I do in the bathroom? Use night lights. Install grab bars by the toilet and in the tub and shower.  Do not use towel bars as grab bars. Use non-skid mats or decals in the tub or shower. If you need to sit down in the shower, use a plastic, non-slip stool. Keep the floor dry. Clean up any water that spills on the floor as soon as it happens. Remove soap buildup in the tub or shower regularly. Attach bath mats securely with double-sided non-slip rug tape. Do not have throw rugs and other things on the floor that can make you trip. What can I do in the bedroom? Use night lights. Make sure that you have a light by your bed that is easy to reach. Do not use any sheets or blankets that are too big for your bed. They should not hang down onto the floor. Have a firm chair that has side arms. You can use this for support while you get dressed. Do not have throw rugs and other things on the floor that can make you trip. What can I do in the kitchen? Clean up any spills right away. Avoid walking on wet floors. Keep items that you use a lot in easy-to-reach places. If you need to reach something above you, use a strong step stool that has a grab bar. Keep electrical cords out of the way. Do not use floor polish or wax that makes floors slippery. If you must use wax, use non-skid floor wax. Do not have throw rugs and other things on the floor that can make you trip. What can I do with my  stairs? Do not leave any items on the stairs. Make sure that there are handrails on both sides of the stairs and use them. Fix handrails that are broken or loose. Make sure that handrails are as long as the stairways. Check any carpeting to make sure that it is firmly attached to the stairs. Fix any carpet that is loose or worn. Avoid having throw rugs at the top or bottom of the stairs. If you do have throw rugs, attach them to the floor with carpet tape. Make sure that you have a light switch at the top of the stairs and the bottom of the stairs. If you do not have them, ask someone to add them for you. What else  can I do to help prevent falls? Wear shoes that: Do not have high heels. Have rubber bottoms. Are comfortable and fit you well. Are closed at the toe. Do not wear sandals. If you use a stepladder: Make sure that it is fully opened. Do not climb a closed stepladder. Make sure that both sides of the stepladder are locked into place. Ask someone to hold it for you, if possible. Clearly mark and make sure that you can see: Any grab bars or handrails. First and last steps. Where the edge of each step is. Use tools that help you move around (mobility aids) if they are needed. These include: Canes. Walkers. Scooters. Crutches. Turn on the lights when you go into a dark area. Replace any light bulbs as soon as they burn out. Set up your furniture so you have a clear path. Avoid moving your furniture around. If any of your floors are uneven, fix them. If there are any pets around you, be aware of where they are. Review your medicines with your doctor. Some medicines can make you feel dizzy. This can increase your chance of falling. Ask your doctor what other things that you can do to help prevent falls. This information is not intended to replace advice given to you by your health care provider. Make sure you discuss any questions you have with your health care provider. Document Released: 09/11/2009 Document Revised: 04/22/2016 Document Reviewed: 12/20/2014 Elsevier Interactive Patient Education  2017 [employer].

## 2021-09-11 NOTE — Progress Notes (Addendum)
Subjective:   Lee Lee is a 70 y.o. male who presents for Medicare Annual/Subsequent preventive examination.  Review of Systems     Cardiac Risk Factors include: advanced age (>45mn, >>15women);dyslipidemia;family history of premature cardiovascular disease;hypertension;male gender;obesity (BMI >30kg/m2);Other (see comment) (excessive ETOH use)     Objective:    Today's Vitals   09/11/21 1000 09/11/21 1022  BP:  140/80  Pulse:  67  Temp:  98.2 F (36.8 C)  SpO2:  95%  Weight:  222 lb (100.7 kg)  Height:  5' 11"  (1.803 m)  PainSc: 0-No pain 0-No pain   Body mass index is 30.96 kg/m.  Advanced Directives 09/11/2021 05/20/2021 05/26/2020 10/30/2019 10/23/2019 12/26/2018 09/21/2017  Does Patient Have a Medical Advance Directive? Yes Yes Yes Yes Yes Yes Yes  Type of Advance Directive Living will;Healthcare Power of [place] - [place] Village[place] will HState CenterLiving will HT[place]Living will [place]up[place]Living will  Does patient want to make changes to medical advance directive? No - Patient declined - No - Patient declined No - Patient declined No - Patient declined - -  Copy of [place] Chart? No - copy requested No - copy requested - No - copy requested No - copy requested No - copy requested -  Would patient like information on creating a medical advance directive? - No - Patient declined - - - - No - Patient declined  Pre-existing out of facility DNR order (yellow form or pink MOST form) - - - - - - -    Current Medications (verified) Outpatient Encounter Medications as of 09/11/2021  Medication Sig   b complex vitamins capsule Take 1 capsule by mouth daily.   busPIRone (BUSPAR) 15 MG tablet Take one tablet twice daily.   carvedilol (COREG) 12.5 MG tablet TAKE ONE TABLET BY MOUTH TWICE A DAY WITH A MEAL (Patient taking differently:  Take 12.5 mg by mouth daily. TAKE ONE TABLET BY MOUTH ONCE A DAY WITH A MEAL)   desvenlafaxine (PRISTIQ) 50 MG 24 hr tablet Take 50 mg by mouth daily.   gabapentin (NEURONTIN) 300 MG capsule Take 1 capsule (300 mg total) by mouth at bedtime. (Patient taking differently: Take 600 mg by mouth at bedtime.)   irbesartan (AVAPRO) 300 MG tablet Take 1 tablet (300 mg total) by mouth daily.   midodrine (PROAMATINE) 10 MG tablet Take 1 tablet (10 mg total) by mouth 3 (three) times daily as needed (While awake and on your feet to prevent dizziness).   rosuvastatin (CRESTOR) 5 MG tablet Take 1 tablet (5 mg total) by mouth daily.   traZODone (DESYREL) 50 MG tablet TAKE 1 TO 2 TABLETS BY MOUTH AT BEDTIME   thiamine (VITAMIN B-1) 50 MG tablet Take 1 tablet (50 mg total) by mouth daily. (Patient not taking: Reported on 09/11/2021)   [DISCONTINUED] Pitavastatin Calcium (LIVALO) 2 MG TABS Take 1 tablet (2 mg total) by mouth daily. (Patient not taking: Reported on 07/17/2021)   No facility-administered encounter medications on file as of 09/11/2021.    Allergies (verified) Patient has no known allergies.   History: Past Medical History:  Diagnosis Date   Alcohol abuse, episodic drinking behavior    Anxiety    Arthritis    back & knees   Atrial flutter (HCC)    s/p RFCA 01/05/13   Calcium oxalate renal stones    Complication of anesthesia  makes him loopy   Depression    ED (erectile dysfunction)    Hemorrhoids    Hypertension    Past Surgical History:  Procedure Laterality Date   ABLATION OF DYSRHYTHMIC FOCUS  01/05/2013   ARTHROSCOPIC REPAIR ACL     ATRIAL FLUTTER ABLATION N/A 01/05/2013   Procedure: ATRIAL FLUTTER ABLATION;  Surgeon: Evans Lance, MD;  [facility] CATH LAB;  Service: Cardiovascular;  Laterality: N/A;   COLONOSCOPY  11/2009   Dr. Benson Norway   ELECTROPHYSIOLOGIC STUDY N/A 07/04/2015   Procedure: A-Flutter Ablation;  Surgeon: Evans Lance, MD;  Location: [place] CV LAB;   Service: Cardiovascular;  Laterality: N/A;   ROTATOR CUFF REPAIR     TOTAL KNEE ARTHROPLASTY Left 10/30/2019   Procedure: TOTAL KNEE ARTHROPLASTY;  Surgeon: Paralee Cancel, MD;  Location: [facility] ORS;  Service: Orthopedics;  Laterality: Left;  70 mins   Family History  Problem Relation Age of Onset   Valvular heart disease Mother    Lymphoma Mother    Social History   Socioeconomic History   Marital status: Married    Spouse name: Not on file   Number of children: 2   Years of education: Not on file   Highest education level: Not on file  Occupational History   Occupation: Retired  Tobacco Use   Smoking status: Former    Types: Cigars    Quit date: 2002    Years since quitting: 20.7   Smokeless tobacco: Current    Types: Chew   Tobacco comments:    chews tobacco when playing golf only  Vaping Use   Vaping Use: Never used  Substance and Sexual Activity   Alcohol use: Yes    Alcohol/week: 5.0 standard drinks    Types: 5 Glasses of wine per week    Comment: 3-4 Glasses of wine a week   Drug use: No   Sexual activity: Yes  Other Topics Concern   Not on file  Social History Narrative   Not on file   Social Determinants of Health   Financial Resource Strain: Low Risk    Difficulty of Paying Living Expenses: Not hard at all  Food Insecurity: No Food Insecurity   Worried About Charity fundraiser in the Last Year: Never true   Ran Out of Food in the Last Year: Never true  Transportation Needs: No Transportation Needs   Lack of Transportation (Medical): No   Lack of Transportation (Non-Medical): No  Physical Activity: Sufficiently Active   Days of Exercise per Week: 5 days   Minutes of Exercise per Session: 30 min  Stress: No Stress Concern Present   Feeling of Stress : Not at all  Social Connections: Socially Integrated   Frequency of Communication with Friends and Family: More than three times a week   Frequency of Social Gatherings with Friends and Family: More than  three times a week   Attends Religious Services: 1 to 4 times per year   Active Member of [employer] or Organizations: Yes   Attends Music therapist: More than 4 times per year   Marital Status: Married    Tobacco Counseling Ready to quit: Not Answered Counseling given: Not Answered Tobacco comments: chews tobacco when playing golf only   Clinical Intake:  Pre-visit preparation completed: Yes  Pain : No/denies pain Pain Score: 0-No pain     BMI - recorded: 30.96 Nutritional Status: BMI > 30  Obese Nutritional Risks: None Diabetes: No  How often  do you need to have someone help you when you read instructions, pamphlets, or other written materials from your doctor or pharmacy?: 1 - Never What is the last grade level you completed in school?: Master's degree in Business  Diabetic? no  Interpreter Needed?: No  Information entered by :: Lisette Abu, LPN   Activities of Daily Living In your present state of health, do you have any difficulty performing the following activities: 09/11/2021  Hearing? N  Vision? N  Difficulty concentrating or making decisions? N  Walking or climbing stairs? N  Dressing or bathing? N  Doing errands, shopping? N  Preparing Food and eating ? N  Using the Toilet? N  In the past six months, have you accidently leaked urine? N  Do you have problems with loss of bowel control? N  Managing your Medications? N  Managing your Finances? N  Housekeeping or managing your Housekeeping? N  Some recent data might be hidden    Patient Care Team: Janith Lima, MD as PCP - General (Internal Medicine) Evans Lance, MD as PCP - Cardiology (Cardiology) Larey Dresser, MD as Referring Physician (Cardiology) Marybelle Killings, MD as Consulting Physician (Orthopedic Surgery) Evans Lance, MD as Consulting Physician (Cardiology) Charlton Haws, [facility] as Pharmacist (Pharmacist)  Indicate any recent Medical Services you may have  received from other than [facility] providers in the past year (date may be approximate).     Assessment:   This is a routine wellness examination for Lee Lee.  Hearing/Vision screen Hearing Screening - Comments:: Patient denied any hearing difficulty.   No hearing aids.  Vision Screening - Comments::  Eye exam done annually by: [facility].  Dietary issues and exercise activities discussed: Current Exercise Habits: Structured exercise class, Type of exercise: treadmill;walking;stretching;strength training/weights;Other - see comments (golf), Time (Minutes): 30, Frequency (Times/Week): 5, Weekly Exercise (Minutes/Week): 150, Intensity: Moderate   Goals Addressed   None   Depression Screen PHQ 2/9 Scores 09/11/2021 12/25/2020 05/26/2020 12/24/2019 12/26/2018 04/28/2017  PHQ - 2 Score 0 0 3 0 3 0  PHQ- 9 Score - 0 8 6 5 1     Fall Risk Fall Risk  09/11/2021 05/19/2021 05/26/2020 12/24/2019 12/26/2018  Falls in the past year? 0 1 0 0 0  Number falls in past yr: 0 0 0 0 -  Injury with Fall? 0 1 0 0 -  Risk for fall due to : No Fall Risks - Orthopedic patient - -  Follow up Falls evaluation completed - Falls evaluation completed;Education provided Falls evaluation completed -    FALL RISK PREVENTION PERTAINING TO THE HOME:  Any stairs in or around the home? Yes  If so, are there any without handrails? No  Home free of loose throw rugs in walkways, pet beds, electrical cords, etc? Yes  Adequate lighting in your home to reduce risk of falls? Yes   ASSISTIVE DEVICES UTILIZED TO PREVENT FALLS:  Life alert? Yes  (Apple Watch) Use of a cane, walker or w/c? No  Grab bars in the bathroom? Yes  Shower chair or bench in shower? Yes  Elevated toilet seat or a handicapped toilet? No   TIMED UP AND GO:  Was the test performed? Yes .  Length of time to ambulate 10 feet: 5 sec.   Gait steady and fast without use of assistive device  Cognitive Function:Normal cognitive status assessed  by direct observation by this Nurse Health Advisor. No abnormalities found.   MMSE - Mini Mental State  Exam 12/26/2018  Orientation to time 5  Orientation to Place 5  Registration 3  Attention/ Calculation 5  Recall 2  Language- name 2 objects 2  Language- repeat 1  Language- follow 3 step command 3  Language- read & follow direction 1  Write a sentence 1  Copy design 1  Total score 29        Immunizations Immunization History  Administered Date(s) Administered   Hepatitis A, Adult 07/17/2019   Influenza, High Dose Seasonal PF 07/27/2018, 07/17/2019   Influenza,inj,Quad PF,6+ Mos 10/31/2017   Influenza-Unspecified 07/28/2018, 07/19/2019, 09/29/2020   Moderna Sars-Covid-2 Vaccination 01/15/2020, 02/14/2020   Pneumococcal Conjugate-13 04/28/2017   Pneumococcal Polysaccharide-23 12/26/2018   Tdap 04/28/2017   Zoster Recombinat (Shingrix) 08/23/2018, 10/10/2018    TDAP status: Up to date  Flu Vaccine status: Up to date  Pneumococcal vaccine status: Up to date  Covid-19 vaccine status: Completed vaccines  Qualifies for Shingles Vaccine? Yes   Zostavax completed Yes   Shingrix Completed?: Yes  Screening Tests Health Maintenance  Topic Date Due   COVID-19 Vaccine (3 - Moderna risk series) 03/13/2020   INFLUENZA VACCINE  06/29/2021   COLONOSCOPY (Pts 45-64yr Insurance coverage will need to be confirmed)  04/13/2026   TETANUS/TDAP  04/29/2027   Hepatitis C Screening  Completed   Zoster Vaccines- Shingrix  Completed   HPV VACCINES  Aged Out    Health Maintenance  Health Maintenance Due  Topic Date Due   COVID-19 Vaccine (3 - Moderna risk series) 03/13/2020   INFLUENZA VACCINE  06/29/2021    Colorectal cancer screening: Type of screening: Colonoscopy. Completed 04/13/2016. Repeat every 10 years  Lung Cancer Screening: (Low Dose CT Chest recommended if Age 68-80years, 30 pack-year currently smoking OR have quit w/in 15years.) does not qualify.   Lung Cancer  Screening Referral: no  Additional Screening:  Hepatitis C Screening: does qualify; Completed yes  Vision Screening: Recommended annual ophthalmology exams for early detection of glaucoma and other disorders of the eye. Is the patient up to date with their annual eye exam?  Yes  Who is the provider or what is the name of the [facility] which the patient [facility]ends [facility] exams[facility] If pt is not established with a provider, would they like to be referred to a provider to establish care? No .   Dental Screening: Recommended annual dental exams for proper oral hygiene  Community Resource Referral / Chronic Care Management: CRR required this visit?  No   CCM required this visit?  No      Plan:     I have personally reviewed and noted the following in the patient's chart:   Medical and social history Use of alcohol, tobacco or illicit drugs  Current medications and supplements including opioid prescriptions. Patient is not currently taking opioid prescriptions. Functional ability and status Nutritional status Physical activity Advanced directives List of other physicians Hospitalizations, surgeries, and ER visits in previous 12 months Vitals Screenings to include cognitive, depression, and falls Referrals and appointments  In addition, I have reviewed and discussed with patient certain preventive protocols, quality metrics, and best practice recommendations. A written personalized care plan for preventive services as well as general preventive health recommendations were provided to patient.     SSheral Flow LPN   [id number]  Nurse Notes:  Hearing Screening - Comments:: Patient denied any hearing difficulty.   No hearing aids.  Vision Screening - Comments::  [facility].    Medical screening examination/treatment/procedure(s) were performed by non-physician practitioner and as supervising physician I  was immediately available for consultation/collaboration.  I agree with above. Cathlean Cower, MD

## 2021-09-27 ENCOUNTER — Other Ambulatory Visit: Payer: Self-pay

## 2021-09-27 ENCOUNTER — Emergency Department (HOSPITAL_BASED_OUTPATIENT_CLINIC_OR_DEPARTMENT_OTHER)
Admission: EM | Admit: 2021-09-27 | Discharge: 2021-09-28 | Disposition: A | Discharge status: Home / Self Care | Payer: PPO | Attending: Emergency Medicine | Admitting: Emergency Medicine

## 2021-09-27 ENCOUNTER — Emergency Department (HOSPITAL_BASED_OUTPATIENT_CLINIC_OR_DEPARTMENT_OTHER): Payer: PPO | Admitting: Radiology

## 2021-09-27 ENCOUNTER — Encounter (HOSPITAL_BASED_OUTPATIENT_CLINIC_OR_DEPARTMENT_OTHER): Payer: Self-pay | Admitting: Obstetrics and Gynecology

## 2021-09-27 DIAGNOSIS — Z79899 Other long term (current) drug therapy: Secondary | ICD-10-CM | POA: Insufficient documentation

## 2021-09-27 DIAGNOSIS — R7881 Bacteremia: Secondary | ICD-10-CM | POA: Diagnosis not present

## 2021-09-27 DIAGNOSIS — Z20822 Contact with and (suspected) exposure to covid-19: Secondary | ICD-10-CM | POA: Diagnosis not present

## 2021-09-27 DIAGNOSIS — I10 Essential (primary) hypertension: Secondary | ICD-10-CM | POA: Diagnosis not present

## 2021-09-27 DIAGNOSIS — Z96652 Presence of left artificial knee joint: Secondary | ICD-10-CM | POA: Diagnosis not present

## 2021-09-27 DIAGNOSIS — R531 Weakness: Secondary | ICD-10-CM | POA: Diagnosis not present

## 2021-09-27 DIAGNOSIS — F17220 Nicotine dependence, chewing tobacco, uncomplicated: Secondary | ICD-10-CM | POA: Insufficient documentation

## 2021-09-27 DIAGNOSIS — R251 Tremor, unspecified: Secondary | ICD-10-CM | POA: Insufficient documentation

## 2021-09-27 DIAGNOSIS — R059 Cough, unspecified: Secondary | ICD-10-CM | POA: Insufficient documentation

## 2021-09-27 DIAGNOSIS — R6889 Other general symptoms and signs: Secondary | ICD-10-CM | POA: Diagnosis present

## 2021-09-27 DIAGNOSIS — Z87891 Personal history of nicotine dependence: Secondary | ICD-10-CM | POA: Diagnosis not present

## 2021-09-27 DIAGNOSIS — R509 Fever, unspecified: Secondary | ICD-10-CM | POA: Insufficient documentation

## 2021-09-27 LAB — URINALYSIS, ROUTINE W REFLEX MICROSCOPIC
Bilirubin Urine: NEGATIVE
Glucose, UA: NEGATIVE mg/dL
Hgb urine dipstick: NEGATIVE
Leukocytes,Ua: NEGATIVE
Nitrite: NEGATIVE
Protein, ur: 30 mg/dL — AB
Specific Gravity, Urine: 1.032 — ABNORMAL HIGH (ref 1.005–1.030)
pH: 7 (ref 5.0–8.0)

## 2021-09-27 LAB — CBC WITH DIFFERENTIAL/PLATELET
Abs Immature Granulocytes: 0.04 10*3/uL (ref 0.00–0.07)
Basophils Absolute: 0.1 10*3/uL (ref 0.0–0.1)
Basophils Relative: 1 %
Eosinophils Absolute: 0 10*3/uL (ref 0.0–0.5)
Eosinophils Relative: 0 %
HCT: 39.2 % (ref 39.0–52.0)
Hemoglobin: 14 g/dL (ref 13.0–17.0)
Immature Granulocytes: 1 %
Lymphocytes Relative: 14 %
Lymphs Abs: 0.9 10*3/uL (ref 0.7–4.0)
MCH: 36.6 pg — ABNORMAL HIGH (ref 26.0–34.0)
MCHC: 35.7 g/dL (ref 30.0–36.0)
MCV: 102.6 fL — ABNORMAL HIGH (ref 80.0–100.0)
Monocytes Absolute: 0.7 10*3/uL (ref 0.1–1.0)
Monocytes Relative: 11 %
Neutro Abs: 4.8 10*3/uL (ref 1.7–7.7)
Neutrophils Relative %: 73 %
Platelets: 147 10*3/uL — ABNORMAL LOW (ref 150–400)
RBC: 3.82 MIL/uL — ABNORMAL LOW (ref 4.22–5.81)
RDW: 12.8 % (ref 11.5–15.5)
WBC: 6.5 10*3/uL (ref 4.0–10.5)
nRBC: 0 % (ref 0.0–0.2)

## 2021-09-27 LAB — BASIC METABOLIC PANEL
Anion gap: 14 (ref 5–15)
BUN: 18 mg/dL (ref 8–23)
CO2: 24 mmol/L (ref 22–32)
Calcium: 9.1 mg/dL (ref 8.9–10.3)
Chloride: 100 mmol/L (ref 98–111)
Creatinine, Ser: 0.87 mg/dL (ref 0.61–1.24)
GFR, Estimated: >60 mL/min (ref >60)
Glucose, Bld: 116 mg/dL — ABNORMAL HIGH (ref 70–99)
Potassium: 3.5 mmol/L (ref 3.5–5.1)
Sodium: 138 mmol/L (ref 135–145)

## 2021-09-27 LAB — PROCALCITONIN: Procalcitonin: 0.11 ng/mL

## 2021-09-27 LAB — RESP PANEL BY RT-PCR (FLU A&B, COVID) ARPGX2
Influenza A by PCR: NEGATIVE
Influenza B by PCR: NEGATIVE
SARS Coronavirus 2 by RT PCR: NEGATIVE

## 2021-09-27 MED ORDER — LORAZEPAM 2 MG/ML IJ SOLN
1.0000 mg | Freq: Once | INTRAMUSCULAR | Status: AC
  Administered 2021-09-27: 1 mg via INTRAVENOUS
  Filled 2021-09-27: qty 1

## 2021-09-27 MED ORDER — ONDANSETRON 4 MG PO TBDP
4.0000 mg | ORAL_TABLET | Freq: Once | ORAL | Status: AC
  Administered 2021-09-27: 4 mg via ORAL
  Filled 2021-09-27: qty 1

## 2021-09-27 MED ORDER — ACETAMINOPHEN 325 MG PO TABS
650.0000 mg | ORAL_TABLET | Freq: Once | ORAL | Status: AC
  Administered 2021-09-27: 650 mg via ORAL
  Filled 2021-09-27: qty 2

## 2021-09-27 MED ORDER — SODIUM CHLORIDE 0.9 % IV SOLN
1.0000 g | Freq: Once | INTRAVENOUS | Status: AC
  Administered 2021-09-27: 1 g via INTRAVENOUS

## 2021-09-27 MED ORDER — SODIUM CHLORIDE 0.9 % IV SOLN: INTRAVENOUS | Status: DC | PRN

## 2021-09-27 MED ORDER — AMOXICILLIN-POT CLAVULANATE 875-125 MG PO TABS: 1.0000 | ORAL_TABLET | Freq: Two times a day (BID) | ORAL | 0 refills | Status: DC

## 2021-09-27 MED ORDER — IBUPROFEN 400 MG PO TABS
600.0000 mg | ORAL_TABLET | Freq: Once | ORAL | Status: AC
  Administered 2021-09-27: 600 mg via ORAL
  Filled 2021-09-27: qty 1

## 2021-09-27 NOTE — ED Notes (Signed)
S1 s2 clear. Rt asked to reassess lung sounds. Pt complains of chills and is diaphoretic and anxious.

## 2021-09-27 NOTE — ED Provider Notes (Addendum)
[facility] EMERGENCY DEPT Provider Note   CSN: [id number] Arrival date & time: 09/27/21  1311     History Chief Complaint  Patient presents with  . Chills  . Fever    Lee Lee is a 70 y.o. male.  Patient presents to ER chief complaint of fever and chills.  Temperatures reported as subjective at home.  Symptoms started this morning has been persistent throughout the day.  He states he is breaking out into a sweat and feels generalized weakness chills.  He had a cough for about 2 days different from his normal cough which is typically chronic.  Otherwise denies any specific headache or chest pain or abdominal pain.  Complains of generalized malaise.  Denies recent travel.      Past Medical History:  Diagnosis Date  . Alcohol abuse, episodic drinking behavior   . Anxiety   . Arthritis    back & knees  . Atrial flutter ([place])    s/p RFCA 01/05/13  . Calcium oxalate renal stones   . Complication of anesthesia    makes him loopy  . Depression   . ED (erectile dysfunction)   . Hemorrhoids   . Hypertension     Patient Active Problem List   Diagnosis Date Noted  . Rigors 09/27/2021  . Thiamine deficiency 06/01/2021  . Deficiency anemia 05/20/2021  . DOE (dyspnea on exertion) 05/19/2021  . Encounter for general adult medical examination with abnormal findings 12/31/2020  . Steatohepatitis 06/18/2020  . Severe episode of recurrent major depressive disorder, without psychotic features ([place]) 06/18/2020  . Seasonal allergic rhinitis 06/18/2020  . Abnormal electrocardiogram (ECG) (EKG) 06/18/2020  . PSA elevation 12/24/2019  . B12 deficiency 12/24/2019  . Thoracic aortic aneurysm without rupture 12/27/2018  . Seasonal allergic rhinitis due to pollen 12/27/2018  . Benign prostatic hyperplasia without lower urinary tract symptoms 12/26/2018  . Unilateral primary osteoarthritis, left knee 03/08/2018  . Insomnia due to anxiety and fear 09/30/2016  .  Restless leg syndrome, familial 09/30/2016  . Iron deficiency anemia 09/30/2016  . Hypertriglyceridemia 06/15/2016  . Hyperlipidemia with target LDL less than 130 06/15/2016  . S/P radiofrequency ablation operation for arrhythmia 07/04/15 07/05/2015  . SVT (supraventricular tachycardia) ([place]) 07/04/2015  . Melanoma of skin ([place]) 04/16/2015  . Atrial flutter ([place]) 12/07/2012  . Essential hypertension 11/07/2012  . Ascending aortic aneurysm 11/07/2012  . Palpitations 11/07/2012  . ED (erectile dysfunction) 11/15/2011    Past Surgical History:  Procedure Laterality Date  . ABLATION OF DYSRHYTHMIC FOCUS  01/05/2013  . ARTHROSCOPIC REPAIR ACL    . ATRIAL FLUTTER ABLATION N/A 01/05/2013   Procedure: ATRIAL FLUTTER ABLATION;  Surgeon: Evans Lance, MD;  Location: [facility] CATH LAB;  Service: Cardiovascular;  Laterality: N/A;  . COLONOSCOPY  11/2009   Dr. Benson Norway  . ELECTROPHYSIOLOGIC STUDY N/A 07/04/2015   Procedure: A-Flutter Ablation;  Surgeon: Evans Lance, MD;  Location: [facility] LAB;  Service: Cardiovascular;  Laterality: N/A;  . ROTATOR CUFF REPAIR    . TOTAL KNEE ARTHROPLASTY Left 10/30/2019   Procedure: TOTAL KNEE ARTHROPLASTY;  Surgeon: Paralee Cancel, MD;  Location: [facility] ORS;  Service: Orthopedics;  Laterality: Left;  70 mins       Family History  Problem Relation Age of Onset  . Valvular heart disease Mother   . Lymphoma Mother     Social History   Tobacco Use  . Smoking status: Former    Types: Cigars    Quit date: 2002  Years since quitting: 20.8  . Smokeless tobacco: Current    Types: Chew  . Tobacco comments:    chews tobacco when playing golf only  Vaping Use  . Vaping Use: Never used  Substance Use Topics  . Alcohol use: Yes    Alcohol/week: 5.0 standard drinks    Types: 5 Glasses of wine per week    Comment: 3-4 Glasses of wine a week  . Drug use: No    Home Medications Prior to Admission medications   Medication Sig Start Date End Date Taking?  Authorizing Provider  amoxicillin-clavulanate (AUGMENTIN) 875-125 MG tablet Take 1 tablet by mouth every 12 (twelve) hours. 09/27/21  Yes Luna Fuse, MD  b complex vitamins capsule Take 1 capsule by mouth daily.    [provider]  busPIRone (BUSPAR) 15 MG tablet Take one tablet twice daily. 12/18/19   M[place], Berdie Ogren, NP  carvedilol (COREG) 12.5 MG tablet TAKE ONE TABLET BY MOUTH TWICE A DAY WITH A MEAL Patient taking differently: Take 12.5 mg by mouth daily. TAKE ONE TABLET BY MOUTH ONCE A DAY WITH A MEAL 12/11/20   Baldwin Jamaica, PA-C  desvenlafaxine (PRISTIQ) 50 MG 24 hr tablet Take 50 mg by mouth daily.    [provider]  gabapentin (NEURONTIN) 300 MG capsule Take 1 capsule (300 mg total) by mouth at bedtime. Patient taking differently: Take 600 mg by mouth at bedtime. 07/19/21   Janith Lima, MD  irbesartan (AVAPRO) 300 MG tablet Take 1 tablet (300 mg total) by mouth daily. 12/11/20   Baldwin Jamaica, PA-C  midodrine (PROAMATINE) 10 MG tablet Take 1 tablet (10 mg total) by mouth 3 (three) times daily as needed (While awake and on your feet to prevent dizziness). 08/21/21   Adrian Prows, MD  rosuvastatin (CRESTOR) 5 MG tablet Take 1 tablet (5 mg total) by mouth daily. 08/21/21 08/16/22  Adrian Prows, MD  thiamine (VITAMIN B-1) 50 MG tablet Take 1 tablet (50 mg total) by mouth daily. Patient not taking: Reported on 09/11/2021 06/01/21   Janith Lima, MD  traZODone (DESYREL) 50 MG tablet Take 1-2 tablets (50-100 mg total) by mouth at bedtime. 09/11/21   Janith Lima, MD  Pitavastatin Calcium (LIVALO) 2 MG TABS Take 1 tablet (2 mg total) by mouth daily. Patient not taking: Reported on 07/17/2021 12/31/20   Janith Lima, MD    Allergies    Patient has no known allergies.  Review of Systems   Review of Systems  Constitutional:  Positive for chills and fever.  HENT:  Negative for ear pain and sore throat.   Eyes:  Negative for pain.  Respiratory:  Negative  for cough.   Cardiovascular:  Negative for chest pain.  Gastrointestinal:  Negative for abdominal pain.  Genitourinary:  Negative for flank pain.  Musculoskeletal:  Negative for back pain.  Skin:  Negative for color change and rash.  Neurological:  Negative for syncope.  All other systems reviewed and are negative.  Physical Exam Updated Vital Signs BP (!) 150/99   Pulse 68   Temp 98.2 F (36.8 C) (Oral)   Resp 14   SpO2 100%   Physical Exam Constitutional:      Appearance: He is well-developed.  HENT:     Head: Normocephalic.     Nose: Nose normal.  Eyes:     Extraocular Movements: Extraocular movements intact.  Cardiovascular:     Rate and Rhythm: Normal rate.  Pulmonary:  Effort: Pulmonary effort is normal.  Skin:    Coloration: Skin is not jaundiced.  Neurological:     General: No focal deficit present.     Mental Status: He is alert and oriented to person, place, and time. Mental status is at baseline.     Motor: No weakness.    ED Results / Procedures / Treatments   Labs (all labs ordered are listed, but only abnormal results are displayed) Labs Reviewed  CBC WITH DIFFERENTIAL/PLATELET - Abnormal; Notable for the following components:      Result Value   RBC 3.82 (*)    MCV 102.6 (*)    MCH 36.6 (*)    Platelets 147 (*)    All other components within normal limits  BASIC METABOLIC PANEL - Abnormal; Notable for the following components:   Glucose, Bld 116 (*)    All other components within normal limits  URINALYSIS, ROUTINE W REFLEX MICROSCOPIC - Abnormal; Notable for the following components:   Specific Gravity, Urine 1.032 (*)    Ketones, ur TRACE (*)    Protein, ur 30 (*)    All other components within normal limits  RESP PANEL BY RT-PCR (FLU A&B, COVID) ARPGX2  CULTURE, BLOOD (ROUTINE X 2)  CULTURE, BLOOD (ROUTINE X 2)  URINE CULTURE  PROCALCITONIN    EKG None  Radiology DG Chest 2 View  Result Date: 09/27/2021 CLINICAL DATA:  Cough.  EXAM: CHEST - 2 VIEW COMPARISON:  May 20, 2021. FINDINGS: The heart size and mediastinal contours are within normal limits. Both lungs are clear. The visualized skeletal structures are unremarkable. IMPRESSION: No active cardiopulmonary disease. Electronically Signed   By: Marijo Conception M.D.   On: 09/27/2021 14:51    Procedures Procedures   Medications Ordered in ED Medications  0.9 %  sodium chloride infusion ( Intravenous New Bag/Given 09/27/21 1903)  acetaminophen (TYLENOL) tablet 650 mg (650 mg Oral Given 09/27/21 1608)  ibuprofen (ADVIL) tablet 600 mg (600 mg Oral Given 09/27/21 1608)  cefTRIAXone (ROCEPHIN) 1 g in sodium chloride 0.9 % 100 mL IVPB (1 g Intravenous New Bag/Given 09/27/21 1905)  ondansetron (ZOFRAN-ODT) disintegrating tablet 4 mg (4 mg Oral Given 09/27/21 1846)    ED Course  I have reviewed the triage vital signs and the nursing notes.  Pertinent labs & imaging results that were available during my care of the patient were reviewed by me and considered in my medical decision making (see chart for details).    MDM Rules/Calculators/A&P                           Labs are sent.  White count looks normal chemistry is normal.  Viral panel is negative.  Urinalysis shows no evidence of infection.  Chest x-ray is unremarkable.  Patient given Tylenol and Motrin.  I discussed outpatient follow-up, however the patient states he is having significant chills and prefers to be observed overnight.  Blood cultures have been sent, pending result.  Vital signs remained stable.  White count is normal urinalysis normal chest x-ray is unremarkable.  Given his advanced age and concern for unknown cause of his fever will treat empirically with antibiotics.   I initially recommended outpatient follow-up for this patient.  However patient is very anxious about his chills/rigors.  He is unable to complete complete sentences due to significant rigors and shakes.  He is unable to  ambulate across the hall without significant chills.  Requesting an  observation stay.  Given advanced age and concern for bacteremia, will bring into the hospitalist group for further observation.  Final Clinical Impression(s) / ED Diagnoses Final diagnoses:  Fever, unspecified fever cause  Rigors    Rx / DC Orders ED Discharge Orders          Ordered    amoxicillin-clavulanate (AUGMENTIN) 875-125 MG tablet  Every 12 hours        09/27/21 1816             Luna Fuse, MD 09/27/21 1812    Luna Fuse, MD 09/27/21 1816    Luna Fuse, MD 09/27/21 2042

## 2021-09-27 NOTE — ED Triage Notes (Signed)
Patient reports to the ER for chills, and shaking, Patient reports he feels like he has a hard time getting the chills to calm down. Patient took tylenol this morning, unsure if 668m or 10015m

## 2021-09-27 NOTE — Plan of Care (Signed)
Pt with rigors.  Work up for source / abnormality neg thus far.  EDP unable to discharge.  Putting in for med-surg obs.  [facility] will assume care on arrival to accepting facility. Until arrival, care as per EDP. However, [facility] available 24/7 for questions and assistance.  Nursing staff, please page [place] and Consults [phone]) as soon as the patient arrives the hospital.

## 2021-09-27 NOTE — ED Notes (Signed)
Pt denis pain but states he does have some nausea requesting medication for same

## 2021-09-27 NOTE — ED Notes (Signed)
In room to check on patient because his wife was leaving and states he seems a little anxious. I went in with pt. He has chills that he is unable to control. Pt was sweating, but he also had 5 blankets on him. I removed all blankets, checked his temp of 98.0. Re-made his bed. Assisted pt up to use the urinal. Urine was very dark.  Returned pt to bed and gave him a blanket. Call bell within reach. Ice water given. RN and MD updated.

## 2021-09-27 NOTE — Discharge Instructions (Addendum)
Call your primary care doctor or specialist as discussed in the next 2-3 days.   Return immediately back to the ER if:  Your symptoms worsen within the next 12-24 hours. You develop new symptoms such as new fevers, persistent vomiting, new pain, shortness of breath, or new weakness or numbness, or if you have any other concerns.  

## 2021-09-28 ENCOUNTER — Emergency Department (HOSPITAL_BASED_OUTPATIENT_CLINIC_OR_DEPARTMENT_OTHER)
Admission: EM | Admit: 2021-09-28 | Discharge: 2021-09-28 | Disposition: A | Discharge status: Home / Self Care | Payer: PPO | Source: Home / Self Care | Attending: Emergency Medicine | Admitting: Emergency Medicine

## 2021-09-28 ENCOUNTER — Telehealth: Payer: Self-pay | Admitting: Internal Medicine

## 2021-09-28 ENCOUNTER — Other Ambulatory Visit: Payer: Self-pay

## 2021-09-28 ENCOUNTER — Encounter (HOSPITAL_BASED_OUTPATIENT_CLINIC_OR_DEPARTMENT_OTHER): Payer: Self-pay | Admitting: Emergency Medicine

## 2021-09-28 ENCOUNTER — Telehealth (HOSPITAL_BASED_OUTPATIENT_CLINIC_OR_DEPARTMENT_OTHER): Payer: Self-pay | Admitting: Emergency Medicine

## 2021-09-28 DIAGNOSIS — R6883 Chills (without fever): Secondary | ICD-10-CM | POA: Insufficient documentation

## 2021-09-28 DIAGNOSIS — Z87891 Personal history of nicotine dependence: Secondary | ICD-10-CM | POA: Insufficient documentation

## 2021-09-28 DIAGNOSIS — I10 Essential (primary) hypertension: Secondary | ICD-10-CM | POA: Insufficient documentation

## 2021-09-28 DIAGNOSIS — Z79899 Other long term (current) drug therapy: Secondary | ICD-10-CM | POA: Insufficient documentation

## 2021-09-28 DIAGNOSIS — R7881 Bacteremia: Secondary | ICD-10-CM | POA: Insufficient documentation

## 2021-09-28 DIAGNOSIS — Z96652 Presence of left artificial knee joint: Secondary | ICD-10-CM | POA: Insufficient documentation

## 2021-09-28 DIAGNOSIS — R799 Abnormal finding of blood chemistry, unspecified: Secondary | ICD-10-CM | POA: Insufficient documentation

## 2021-09-28 LAB — BLOOD CULTURE ID PANEL (REFLEXED) - BCID2

## 2021-09-28 LAB — URINE CULTURE

## 2021-09-28 MED ORDER — LORAZEPAM 1 MG PO TABS
1.0000 mg | ORAL_TABLET | Freq: Three times a day (TID) | ORAL | 0 refills | Status: DC | PRN
Start: 2021-09-28 — End: 2021-10-01

## 2021-09-28 MED ORDER — LORAZEPAM 2 MG/ML IJ SOLN
1.0000 mg | Freq: Once | INTRAMUSCULAR | Status: AC
  Administered 2021-09-28: 1 mg via INTRAVENOUS
  Filled 2021-09-28: qty 1

## 2021-09-28 MED ORDER — LORAZEPAM 1 MG PO TABS
1.0000 mg | ORAL_TABLET | Freq: Once | ORAL | Status: AC
  Administered 2021-09-28: 1 mg via ORAL
  Filled 2021-09-28: qty 1

## 2021-09-28 NOTE — ED Provider Notes (Signed)
Patient has done well overnight.  From what I can tell no fevers.  Vital signs are okay besides some hypertension.  At this point there is no clear source of an infection.  He was previously sent Augmentin which I think is reasonable to do while we are awaiting cultures.  Otherwise he feels like he can follow-up closely with his PCP and given he was observed all night while waiting on a bed I think is reasonable to discharge him this morning with return precautions.  Patient agrees.   Sherwood Gambler, MD 09/28/21 [phone]

## 2021-09-28 NOTE — Telephone Encounter (Signed)
Pt has been informed tht option would need to be discussed during Lee Lee on 11/3. I explained that anxiety meds are rx'd by a physician. He expressed understanding.

## 2021-09-28 NOTE — ED Provider Notes (Signed)
[facility] EMERGENCY DEPT Provider Note   CSN: [id number] Arrival date & time: 09/28/21  1725     History Chief Complaint  Patient presents with   Abnormal Lab    Lee Lee is a 70 y.o. male.  He is a history of hypertension and A. fib.  He was seen here yesterday for shaking chills and sweats.  He had blood work done and blood cultures was given ceftriaxone and sent home with a prescription for Augmentin for possible infection.  His urine and chest x-ray did not show any clear infection and his white count was normal.  He received a phone call today that 1 bottle was positive for staph.  He continues to feel chilled and shakey.  Afebrile here.  No other systemic symptoms.  Patient does admit to alcohol use although he denies any alcohol for the last month.  He said the Ativan that they gave him last night helped a lot.  He is asking for that again.  The history is provided by the patient.  Abnormal Lab Time since result:  Hours Patient referred by:  ED personnel Resulting agency:  Internal Result type: cultures   Cultures:    Blood culture: abnormal       Past Medical History:  Diagnosis Date   Alcohol abuse, episodic drinking behavior    Anxiety    Arthritis    back & knees   Atrial flutter ([place])    s/p RFCA 01/05/13   Calcium oxalate renal stones    Complication of anesthesia    makes him loopy   Depression    ED (erectile dysfunction)    Hemorrhoids    Hypertension     Patient Active Problem List   Diagnosis Date Noted   Rigors 09/27/2021   Thiamine deficiency 06/01/2021   Deficiency anemia 05/20/2021   DOE (dyspnea on exertion) 05/19/2021   Encounter for general adult medical examination with abnormal findings 12/31/2020   Steatohepatitis 06/18/2020   Severe episode of recurrent major depressive disorder, 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/18/2020   Seasonal allergic rhinitis 06/18/2020   Abnormal electrocardiogram (ECG) (EKG)  06/18/2020   PSA elevation 12/24/2019   B12 deficiency 12/24/2019   Thoracic aortic aneurysm without rupture 12/27/2018   Seasonal allergic rhinitis due to pollen 12/27/2018   Benign prostatic hyperplasia without lower urinary tract symptoms 12/26/2018   Unilateral primary osteoarthritis, left knee 03/08/2018   Insomnia due to anxiety and fear 09/30/2016   Restless leg syndrome, familial 09/30/2016   Iron deficiency anemia 09/30/2016   Hypertriglyceridemia 06/15/2016   Hyperlipidemia with target LDL less than 130 06/15/2016   S/P radiofrequency ablation operation for arrhythmia 07/04/15 07/05/2015   SVT (supraventricular tachycardia) ([place]) 07/04/2015   Melanoma of skin ([place]) 04/16/2015   Atrial flutter ([place]) 12/07/2012   Essential hypertension 11/07/2012   Ascending aortic aneurysm 11/07/2012   Palpitations 11/07/2012   ED (erectile dysfunction) 11/15/2011    Past Surgical History:  Procedure Laterality Date   ABLATION OF DYSRHYTHMIC FOCUS  01/05/2013   ARTHROSCOPIC REPAIR ACL     ATRIAL FLUTTER ABLATION N/A 01/05/2013   Procedure: ATRIAL FLUTTER ABLATION;  Surgeon: Evans Lance, MD;  Location[facility] CATH LAB;  Service: Cardiovascular;  Laterality: N/A;   COLONOSCOPY  11/2009   Dr. Benson Norway   ELECTROPHYSIOLOGIC STUDY N/A 07/04/2015   Procedure: A-Flutter Ablation;  Surgeon: Evans Lance, MD;  Location: [facility] CV LAB;  Service: Cardiovascular;  Laterality: N/A;   ROTATOR CUFF REPAIR  TOTAL KNEE ARTHROPLASTY Left 10/30/2019   Procedure: TOTAL KNEE ARTHROPLASTY;  Surgeon: Paralee Cancel, MD;  Location: [facility] ORS;  Service: Orthopedics;  Laterality: Left;  70 mins       Family History  Problem Relation Age of Onset   Valvular heart disease Mother    Lymphoma Mother     Social History   Tobacco Use   Smoking status: Former    Types: Cigars    Quit date: 2002    Years since quitting: 20.8   Smokeless tobacco: Current    Types: Chew   Tobacco comments:    chews tobacco when  playing golf only  Vaping Use   Vaping Use: Never used  Substance Use Topics   Alcohol use: Yes    Alcohol/week: 5.0 standard drinks    Types: 5 Glasses of wine per week    Comment: 3-4 Glasses of wine a week   Drug use: No    Home Medications Prior to Admission medications   Medication Sig Start Date End Date Taking? Authorizing Provider  amoxicillin-clavulanate (AUGMENTIN) 875-125 MG tablet Take 1 tablet by mouth every 12 (twelve) hours. 09/27/21   Luna Fuse, MD  b complex vitamins capsule Take 1 capsule by mouth daily.    [provider]  busPIRone (BUSPAR) 15 MG tablet Take one tablet twice daily. 12/18/19   Mozingo, Berdie Ogren, NP  carvedilol (COREG) 12.5 MG tablet TAKE ONE TABLET BY MOUTH TWICE A DAY WITH A MEAL Patient taking differently: Take 12.5 mg by mouth daily. TAKE ONE TABLET BY MOUTH ONCE A DAY WITH A MEAL 12/11/20   Baldwin Jamaica, PA-C  desvenlafaxine (PRISTIQ) 50 MG 24 hr tablet Take 50 mg by mouth daily.    [provider]  gabapentin (NEURONTIN) 300 MG capsule Take 1 capsule (300 mg total) by mouth at bedtime. Patient taking differently: Take 600 mg by mouth at bedtime. 07/19/21   Janith Lima, MD  irbesartan (AVAPRO) 300 MG tablet Take 1 tablet (300 mg total) by mouth daily. 12/11/20   Baldwin Jamaica, PA-C  midodrine (PROAMATINE) 10 MG tablet Take 1 tablet (10 mg total) by mouth 3 (three) times daily as needed (While awake and on your feet to prevent dizziness). 08/21/21   Adrian Prows, MD  rosuvastatin (CRESTOR) 5 MG tablet Take 1 tablet (5 mg total) by mouth daily. 08/21/21 08/16/22  Adrian Prows, MD  thiamine (VITAMIN B-1) 50 MG tablet Take 1 tablet (50 mg total) by mouth daily. Patient not taking: Reported on 09/11/2021 06/01/21   Janith Lima, MD  traZODone (DESYREL) 50 MG tablet Take 1-2 tablets (50-100 mg total) by mouth at bedtime. 09/11/21   Janith Lima, MD  Pitavastatin Calcium (LIVALO) 2 MG TABS Take 1 tablet (2 mg total) by  mouth daily. Patient not taking: Reported on 07/17/2021 12/31/20   Janith Lima, MD    Allergies    Patient has no known allergies.  Review of Systems   Review of Systems  Constitutional:  Positive for chills. Negative for fever.  HENT:  Negative for sore throat.   Eyes:  Negative for visual disturbance.  Respiratory:  Negative for shortness of breath.   Cardiovascular:  Negative for chest pain.  Gastrointestinal:  Negative for abdominal pain.  Genitourinary:  Negative for dysuria.  Musculoskeletal:  Negative for myalgias.  Skin:  Negative for rash.  Neurological:  Positive for tremors. Negative for headaches.   Physical Exam Updated Vital Signs BP (!) 173/90  Pulse 64   Temp 98.6 F (37 C) (Oral)   Resp 20   Ht 5' 11"  (1.803 m)   Wt 100.7 kg   SpO2 100%   BMI 30.96 kg/m   Physical Exam Vitals and nursing note reviewed.  Constitutional:      Appearance: Normal appearance. He is well-developed.  HENT:     Head: Normocephalic and atraumatic.  Eyes:     Conjunctiva/sclera: Conjunctivae normal.  Cardiovascular:     Rate and Rhythm: Normal rate and regular rhythm.     Heart sounds: No murmur heard. Pulmonary:     Effort: Pulmonary effort is normal. No respiratory distress.     Breath sounds: Normal breath sounds.  Abdominal:     Palpations: Abdomen is soft.     Tenderness: There is no abdominal tenderness. There is no guarding or rebound.  Musculoskeletal:        General: No deformity or signs of injury. Normal range of motion.     Cervical back: Neck supple.  Skin:    General: Skin is warm and dry.  Neurological:     General: No focal deficit present.     Mental Status: He is alert and oriented to person, place, and time.    ED Results / Procedures / Treatments   Labs (all labs ordered are listed, but only abnormal results are displayed) Labs Reviewed  CULTURE, BLOOD (ROUTINE X 2)  CULTURE, BLOOD (ROUTINE X 2)    EKG None  Radiology DG Chest 2  View  Result Date: 09/27/2021 CLINICAL DATA:  Cough. EXAM: CHEST - 2 VIEW COMPARISON:  May 20, 2021. FINDINGS: The heart size and mediastinal contours are within normal limits. Both lungs are clear. The visualized skeletal structures are unremarkable. IMPRESSION: No active cardiopulmonary disease. Electronically Signed   By: Marijo Conception M.D.   On: 09/27/2021 14:51    Procedures Procedures   Medications Ordered in ED Medications  LORazepam (ATIVAN) tablet 1 mg (1 mg Oral Given 09/28/21 2249)    ED Course  I have reviewed the triage vital signs and the nursing notes.  Pertinent labs & imaging results that were available during my care of the patient were reviewed by me and considered in my medical decision making (see chart for details).  Clinical Course as of 09/29/21 1002  Mon Sep 28, 2021  2143 Discussed with Dr. Baxter Flattery infectious disease.  She said this is likely contaminant but to grab another 2 sets of cultures.  He can continue his antibiotic. [MB]    Clinical Course User Index [MB] Hayden Rasmussen, MD   MDM Rules/Calculators/A&P                          Patient returns to ED with positive blood cultures.  He remains symptomatic in the sense of he still feels chilled and is having some shakes.  Differential diagnosis includes bacteremia, sepsis, Sirs, withdrawal, contamination . he denies any recent alcohol intake and denies that he has been trying to cut back/withdrawal symptoms.  Reviewed results of blood culture and last ED visit with infectious disease Dr. Baxter Flattery.  She feels this is possibly contaminant but get another set of blood cultures and have patient continue antibiotic.  Reviewed this with patient and he is comfortable plan.  He is asking for a short term prescription for some Ativan as that helped him symptomatically.  Counseled to review this with his primary care doctor and to  not drink alcohol while taking.  Return instructions discussed.  Final Clinical  Impression(s) / ED Diagnoses Final diagnoses:  Chills (without fever)  Positive blood culture    Rx / DC Orders ED Discharge Orders          Ordered    LORazepam (ATIVAN) 1 MG tablet  3 times daily PRN        09/28/21 2241             Hayden Rasmussen, MD 09/29/21 1006

## 2021-09-28 NOTE — ED Notes (Signed)
Pt has been discharged home with discharge instructions for antibiotics and to follow up with his PCP which he verbalizes understanding.

## 2021-09-28 NOTE — Telephone Encounter (Signed)
Patient requesting call back to discuss anxiety otc med options prior to appt on 10-01-2021

## 2021-09-28 NOTE — ED Triage Notes (Signed)
Pt from home after receiving a call from ED stating his blood cultures were positive for staph and to return immediately. Pt reports his chills remain and that he has had one dose of antibiotic prescribed to him this morning. Pt alert & oriented, nad noted.

## 2021-09-28 NOTE — Discharge Instructions (Signed)
You were seen in the emergency department for evaluation of a positive blood culture.  We reviewed case with our infectious disease doctor and she recommended repeating the blood cultures and having you continue your antibiotics.  We will contact you if you need further treatment.  Please follow-up with your primary care doctor.  Return to the emergency department if any worsening or concerning symptoms.

## 2021-09-30 LAB — CULTURE, BLOOD (ROUTINE X 2): Special Requests: ADEQUATE

## 2021-10-01 ENCOUNTER — Other Ambulatory Visit: Payer: Self-pay

## 2021-10-01 ENCOUNTER — Encounter: Payer: Self-pay | Admitting: Internal Medicine

## 2021-10-01 ENCOUNTER — Ambulatory Visit (INDEPENDENT_AMBULATORY_CARE_PROVIDER_SITE_OTHER): Payer: PPO | Admitting: Internal Medicine

## 2021-10-01 DIAGNOSIS — R799 Abnormal finding of blood chemistry, unspecified: Secondary | ICD-10-CM | POA: Insufficient documentation

## 2021-10-01 DIAGNOSIS — F1010 Alcohol abuse, uncomplicated: Secondary | ICD-10-CM | POA: Diagnosis not present

## 2021-10-01 DIAGNOSIS — F332 Major depressive disorder, recurrent severe without psychotic features: Secondary | ICD-10-CM | POA: Diagnosis not present

## 2021-10-01 DIAGNOSIS — G2581 Restless legs syndrome: Secondary | ICD-10-CM | POA: Diagnosis not present

## 2021-10-01 MED ORDER — LORAZEPAM 1 MG PO TABS: 1.0000 mg | ORAL_TABLET | Freq: Three times a day (TID) | ORAL | 0 refills | Status: DC | PRN

## 2021-10-01 MED ORDER — GABAPENTIN 300 MG PO CAPS: 300.0000 mg | ORAL_CAPSULE | Freq: Every day | ORAL | 0 refills | Status: DC

## 2021-10-01 MED ORDER — DESVENLAFAXINE SUCCINATE ER 50 MG PO TB24: 50.0000 mg | ORAL_TABLET | Freq: Every day | ORAL | 0 refills | Status: DC

## 2021-10-01 NOTE — Assessment & Plan Note (Signed)
He is making attempt to quit and likely cause of his tremors. Rx ativan #25 no refills to help with withdrawal symptoms. Part of AA and advised to lean on this structure for support.

## 2021-10-01 NOTE — Progress Notes (Signed)
   Subjective:   Patient ID: Lee Lee, male    DOB: [birth year], 70 y.o.   MRN: [id number]  HPI The patient is a 70 YO man coming in for ER follow up and some medication changes needed.   Review of Systems  Constitutional: Negative.   HENT: Negative.    Eyes: Negative.   Respiratory:  Negative for cough, chest tightness and shortness of breath.   Cardiovascular:  Negative for chest pain, palpitations and leg swelling.  Gastrointestinal:  Negative for abdominal distention, abdominal pain, constipation, diarrhea, nausea and vomiting.  Musculoskeletal: Negative.   Skin: Negative.   Neurological:  Positive for tremors.  Psychiatric/Behavioral: Negative.     Objective:  Physical Exam Constitutional:      Appearance: He is well-developed.  HENT:     Head: Normocephalic and atraumatic.  Cardiovascular:     Rate and Rhythm: Normal rate and regular rhythm.  Pulmonary:     Effort: Pulmonary effort is normal. No respiratory distress.     Breath sounds: Normal breath sounds. No wheezing or rales.  Abdominal:     General: Bowel sounds are normal. There is no distension.     Palpations: Abdomen is soft.     Tenderness: There is no abdominal tenderness. There is no rebound.  Musculoskeletal:     Cervical back: Normal range of motion.  Skin:    General: Skin is warm and dry.  Neurological:     Mental Status: He is alert and oriented to person, place, and time.     Coordination: Coordination normal.    Vitals:   10/01/21 0801  BP: 122/80  Pulse: 70  Resp: 18  SpO2: 96%  Weight: 220 lb 9.6 oz (100.1 kg)  Height: 5' 11"  (1.803 m)    This visit occurred during the SARS-CoV-2 public health emergency.  Safety protocols were in place, including screening questions prior to the visit, additional usage of staff PPE, and extensive cleaning of exam room while observing appropriate contact time as indicated for disinfecting solutions.   Assessment & Plan:

## 2021-10-01 NOTE — Patient Instructions (Addendum)
Go ahead and take the last of the amoxicillin although I am not sure this is needed.   We will wait for the blood cultures at 5 days.  We have sent in the new dosages of the pristiq and gabapentin.   We have refilled the lorazepam.

## 2021-10-01 NOTE — Assessment & Plan Note (Addendum)
Talked to him about 1/4 positive for likely contaminant. He will finish amoxicillin as he has only a few pills left. This is likely not true infection as repeat 2 blood cultures from less than 24 hours later and prior to antibiotics are both negative. Will follow up on final results of blood cultures (negative at 2 days as of visit).

## 2021-10-01 NOTE — Assessment & Plan Note (Signed)
He requests reduction in dose from 100 mg pristiq to 50 mg daily due to some dizziness he has been having for months. States PCP told him he would change dose but he never received adjusted dose.

## 2021-10-02 NOTE — Assessment & Plan Note (Addendum)
He has asked to decrease from gabapentin 600 mg qhs to 300 mg qhs so this prescription is changed and we will plan to decrease to 300 mg qhs gabapentin.

## 2021-10-04 LAB — CULTURE, BLOOD (ROUTINE X 2)
Culture: NO GROWTH
Culture: NO GROWTH
Special Requests: ADEQUATE
Special Requests: ADEQUATE

## 2021-10-07 ENCOUNTER — Other Ambulatory Visit: Payer: Self-pay | Admitting: Internal Medicine

## 2021-10-07 ENCOUNTER — Telehealth: Payer: Self-pay | Admitting: Internal Medicine

## 2021-10-07 NOTE — Telephone Encounter (Signed)
Please clarify "refill not appropriate" reason for denial and I will inform the pt.

## 2021-10-07 NOTE — Telephone Encounter (Signed)
Patient requesting a call back to discuss denial of refill

## 2021-10-08 ENCOUNTER — Other Ambulatory Visit: Payer: Self-pay | Admitting: Internal Medicine

## 2021-10-29 ENCOUNTER — Other Ambulatory Visit: Payer: Self-pay | Admitting: Internal Medicine

## 2021-11-03 ENCOUNTER — Other Ambulatory Visit: Payer: Self-pay

## 2021-11-03 ENCOUNTER — Encounter: Payer: Self-pay | Admitting: Internal Medicine

## 2021-11-03 ENCOUNTER — Ambulatory Visit (INDEPENDENT_AMBULATORY_CARE_PROVIDER_SITE_OTHER): Payer: PPO | Admitting: Internal Medicine

## 2021-11-03 VITALS — BP 136/86 | HR 71 | Temp 98.1°F | Resp 16 | Ht 71.0 in | Wt 222.0 lb

## 2021-11-03 DIAGNOSIS — F332 Major depressive disorder, recurrent severe without psychotic features: Secondary | ICD-10-CM | POA: Diagnosis not present

## 2021-11-03 DIAGNOSIS — E519 Thiamine deficiency, unspecified: Secondary | ICD-10-CM

## 2021-11-03 DIAGNOSIS — I10 Essential (primary) hypertension: Secondary | ICD-10-CM

## 2021-11-03 DIAGNOSIS — N400 Enlarged prostate without lower urinary tract symptoms: Secondary | ICD-10-CM

## 2021-11-03 DIAGNOSIS — E538 Deficiency of other specified B group vitamins: Secondary | ICD-10-CM

## 2021-11-03 DIAGNOSIS — F411 Generalized anxiety disorder: Secondary | ICD-10-CM

## 2021-11-03 DIAGNOSIS — F1010 Alcohol abuse, uncomplicated: Secondary | ICD-10-CM

## 2021-11-03 DIAGNOSIS — K7581 Nonalcoholic steatohepatitis (NASH): Secondary | ICD-10-CM | POA: Diagnosis not present

## 2021-11-03 LAB — BASIC METABOLIC PANEL
BUN: 15 mg/dL (ref 6–23)
CO2: 27 mEq/L (ref 19–32)
Calcium: 9.1 mg/dL (ref 8.4–10.5)
Chloride: 100 mEq/L (ref 96–112)
Creatinine, Ser: 0.79 mg/dL (ref 0.40–1.50)
GFR: 91.03 mL/min (ref >60.00)
Glucose, Bld: 173 mg/dL — ABNORMAL HIGH (ref 70–99)
Potassium: 3.9 mEq/L (ref 3.5–5.1)
Sodium: 139 mEq/L (ref 135–145)

## 2021-11-03 LAB — HEPATIC FUNCTION PANEL
ALT: 41 U/L (ref 0–53)
AST: 74 U/L — ABNORMAL HIGH (ref 0–37)
Albumin: 3.8 g/dL (ref 3.5–5.2)
Alkaline Phosphatase: 84 U/L (ref 39–117)
Bilirubin, Direct: 0.2 mg/dL (ref 0.0–0.3)
Total Bilirubin: 0.8 mg/dL (ref 0.2–1.2)
Total Protein: 7.1 g/dL (ref 6.0–8.3)

## 2021-11-03 LAB — CBC WITH DIFFERENTIAL/PLATELET
Basophils Absolute: 0 10*3/uL (ref 0.0–0.1)
Basophils Relative: 0.6 % (ref 0.0–3.0)
Eosinophils Absolute: 0 10*3/uL (ref 0.0–0.7)
Eosinophils Relative: 0.6 % (ref 0.0–5.0)
HCT: 42.7 % (ref 39.0–52.0)
Hemoglobin: 14.6 g/dL (ref 13.0–17.0)
Lymphocytes Relative: 22.6 % (ref 12.0–46.0)
Lymphs Abs: 1.1 10*3/uL (ref 0.7–4.0)
MCHC: 34.1 g/dL (ref 30.0–36.0)
MCV: 106.4 fl — ABNORMAL HIGH (ref 78.0–100.0)
Monocytes Absolute: 0.4 10*3/uL (ref 0.1–1.0)
Monocytes Relative: 7.5 % (ref 3.0–12.0)
Neutro Abs: 3.2 10*3/uL (ref 1.4–7.7)
Neutrophils Relative %: 68.7 % (ref 43.0–77.0)
Platelets: 121 10*3/uL — ABNORMAL LOW (ref 150.0–400.0)
RBC: 4.01 Mil/uL — ABNORMAL LOW (ref 4.22–5.81)
RDW: 13 % (ref 11.5–15.5)
WBC: 4.7 10*3/uL (ref 4.0–10.5)

## 2021-11-03 LAB — PSA: PSA: 4.07 ng/mL — ABNORMAL HIGH (ref 0.10–4.00)

## 2021-11-03 MED ORDER — BUSPIRONE HCL 5 MG PO TABS: 5.0000 mg | ORAL_TABLET | Freq: Three times a day (TID) | ORAL | 0 refills | Status: DC

## 2021-11-03 MED ORDER — TRAZODONE HCL 150 MG PO TABS
150.0000 mg | ORAL_TABLET | Freq: Every day | ORAL | 0 refills | Status: DC
Start: 2021-11-03 — End: 2022-01-28

## 2021-11-03 MED ORDER — VITAMIN B-1 50 MG PO TABS: 50.0000 mg | ORAL_TABLET | Freq: Every day | ORAL | 1 refills | Status: DC

## 2021-11-03 NOTE — Patient Instructions (Signed)
Alcohol Abuse and Dependence Information, Adult Alcohol is a widely available drug. People drink alcohol in different amounts. People who drink alcohol very often and in large amounts often have problems during and after drinking. They may develop what is called an alcohol use disorder. There are two main types of alcohol use disorders: Alcohol abuse. This is when you use alcohol too much or too often. You may use alcohol to make yourself feel happy or to reduce stress. You may have a hard time setting a limit on the amount you drink. Alcohol dependence. This is when you use alcohol consistently for a period of time, and your body changes as a result. This can make it hard to stop drinking because you may start to feel sick or feel different when you do not use alcohol. These symptoms are known as withdrawal. How can alcohol abuse and dependence affect me? Alcohol abuse and dependence can have a negative effect on your life. Drinking too much can lead to addiction. You may feel like you need alcohol to function normally. You may drink alcohol before work in the morning, during the day, or as soon as you get home from work in the evening. These actions can result in: Poor work performance. Job loss. Financial problems. Car crashes or criminal charges from driving after drinking alcohol. Problems in your relationships with friends and family. Losing the trust and respect of coworkers, friends, and family. Drinking heavily over a long period of time can permanently damage your body and brain, and can cause lifelong health issues, such as: Damage to your liver or pancreas. Heart problems, high blood pressure, or stroke. Certain cancers. Decreased ability to fight infections. Brain or nerve damage. Depression. Early (premature) death. If you are careless or you crave alcohol, it is easy to drink more than your body can handle (overdose). Alcohol overdose is a serious situation that requires  hospitalization. It may lead to permanent injuries or death. What can increase my risk? Having a family history of alcohol abuse. Having depression or other mental health conditions. Beginning to drink at an early age. Binge drinking often. Experiencing trauma, stress, and an unstable home life during childhood. Spending time with people who drink often. What actions can I take to prevent or manage alcohol abuse and dependence? Do not drink alcohol if: Your health care provider tells you not to drink. You are pregnant, may be pregnant, or are planning to become pregnant. If you drink alcohol: Limit how much you use to: 0-1 drink a day for women. 0-2 drinks a day for men. Be aware of how much alcohol is in your drink. In the [place] one drink equals one 12 oz bottle of beer (355 mL), one 5 oz glass of wine (148 mL), or one 1 oz glass of hard liquor (44 mL). Stop drinking if you have been drinking too much. This can be very hard to do if you are used to abusing alcohol. If you begin to have withdrawal symptoms, talk with your health care provider or a person that you trust. These symptoms may include anxiety, shaky hands, headache, nausea, sweating, or not being able to sleep. Choose to drink nonalcoholic beverages in social gatherings and places where there may be alcohol. Activity Spend more time on activities that you enjoy that do not involve alcohol, like hobbies or exercise. Find healthy ways to cope with stress, such as exercise, meditation, or spending time with people you care about. General information Talk to your family,  coworkers, and friends about supporting you in your efforts to stop drinking. If they drink, ask them not to drink around you. Spend more time with people who do not drink alcohol. If you think that you have an alcohol dependency problem: Tell friends or family about your concerns. Talk with your health care provider or another health professional about where to  get help. Work with a Transport planner and a Regulatory affairs officer. Consider joining a support group for people who struggle with alcohol abuse and dependence. Where to find support  Your health care provider. SMART Recovery: www.smartrecovery.org Therapy and support groups Local treatment centers or chemical dependency counselors. Local AA groups in your community: NicTax.com.pt Where to find more information Centers for Disease Control and Prevention: http://www.wolf.info/ National Institute on Alcohol Abuse and Alcoholism: http://www.bradshaw.com/ Alcoholics Anonymous (AA): [employer]Tax.com.pt Contact a health care provider if: You drank more or for longer than you intended on more than one occasion. You tried to stop drinking or to cut back on how much you drink, but you were not able to. You often drink to the point of vomiting or passing out. You want to drink so badly that you cannot think about anything else. You have problems in your life due to drinking, but you continue to drink. You keep drinking even though you feel anxious, depressed, or have experienced memory loss. You have stopped doing the things you used to enjoy in order to drink. You have to drink more than you used to in order to get the effect you want. You experience anxiety, sweating, nausea, shakiness, and trouble sleeping when you try to stop drinking. Get help right away if: You have thoughts about hurting yourself or others. You have serious withdrawal symptoms, including: Confusion. Racing heart. High blood pressure. Fever. If you ever feel like you may hurt yourself or others, or have thoughts about taking your own life, get help right away. You can go to your nearest emergency department or call: Your local emergency services (911 in the [place] A suicide crisis helpline, such as the [place] at 201-400-2787 or 988 in the [place]. This is open 24 hours a day. Summary Alcohol abuse and dependence can  have a negative effect on your life. Drinking too much or too often can lead to addiction. If you drink alcohol, limit how much you use. If you are having trouble keeping your drinking under control, find ways to change your behavior. Hobbies, calming activities, exercise, or support groups can help. If you feel you need help with changing your drinking habits, talk with your health care provider, a good friend, or a therapist, or go to an [place] group. This information is not intended to replace advice given to you by your health care provider. Make sure you discuss any questions you have with your health care provider. Document Revised: 06/10/2021 Document Reviewed: 01/23/2019 Elsevier Patient Education  [place].

## 2021-11-03 NOTE — Progress Notes (Signed)
Subjective:  Patient ID: Lee Lee, male    DOB: 02-02-1951  Age: 70 y.o. MRN: [id number]  CC: Hypertension and Depression  This visit occurred during the SARS-CoV-2 public health emergency.  Safety protocols were in place, including screening questions prior to the visit, additional usage of staff PPE, and extensive cleaning of exam room while observing appropriate contact time as indicated for disinfecting solutions.    HPI Lee Lee presents for f/up -  He says he drinks about 2 to 3 glasses of wine a day.  He says he is not willing to go to rehab because he says that his wife told him 6 months ago that if he went back to rehab she would leave him.  Also, he wants to spend the holidays with his family.  He saw someone else a month ago who prescribed Ativan.  He is asked me for an Ativan refill but I have told him I am not comfortable prescribing it.  He was prescribed BuSpar about a year and a half ago but he stopped taking it.  He has been prescribed trazodone but he has not advance the dose any further than 50 mg at bedtime.  He says he does not know what makes him anxious.  He does not feel helpless or hopeless and denies SI or HI.  He continues to to feel depressed with a poor appetite and fatigue.    Outpatient Medications Prior to Visit  Medication Sig Dispense Refill   amoxicillin (AMOXIL) 500 MG capsule Take 500 mg by mouth 3 (three) times daily.     b complex vitamins capsule Take 1 capsule by mouth daily.     carvedilol (COREG) 12.5 MG tablet TAKE ONE TABLET BY MOUTH TWICE A DAY WITH A MEAL (Patient taking differently: Take 12.5 mg by mouth daily. TAKE ONE TABLET BY MOUTH ONCE A DAY WITH A MEAL) 180 tablet 2   desvenlafaxine (PRISTIQ) 50 MG 24 hr tablet Take 1 tablet (50 mg total) by mouth daily. 90 tablet 0   gabapentin (NEURONTIN) 300 MG capsule Take 1 capsule (300 mg total) by mouth at bedtime. 90 capsule 0   indapamide (LOZOL) 2.5 MG tablet TAKE ONE TABLET BY  MOUTH DAILY 90 tablet 0   irbesartan (AVAPRO) 300 MG tablet Take 1 tablet (300 mg total) by mouth daily. 90 tablet 2   midodrine (PROAMATINE) 10 MG tablet Take 1 tablet (10 mg total) by mouth 3 (three) times daily as needed (While awake and on your feet to prevent dizziness). 240 tablet 3   rosuvastatin (CRESTOR) 5 MG tablet Take 1 tablet (5 mg total) by mouth daily. 90 tablet 3   busPIRone (BUSPAR) 15 MG tablet Take one tablet twice daily. 180 tablet 1   thiamine (VITAMIN B-1) 50 MG tablet Take 1 tablet (50 mg total) by mouth daily. 90 tablet 1   traZODone (DESYREL) 50 MG tablet Take 1-2 tablets (50-100 mg total) by mouth at bedtime. 180 tablet 1   LORazepam (ATIVAN) 1 MG tablet Take 1 tablet (1 mg total) by mouth 3 (three) times daily as needed for anxiety. 25 tablet 0   No facility-administered medications prior to visit.    ROS Review of Systems  Constitutional:  Positive for appetite change and fatigue. Negative for chills, diaphoresis and unexpected weight change.  HENT: Negative.    Eyes: Negative.  Negative for visual disturbance.  Respiratory:  Negative for cough, chest tightness, shortness of breath and wheezing.   Cardiovascular:  Negative for chest pain, palpitations and leg swelling.  Gastrointestinal:  Negative for abdominal pain, constipation, diarrhea, nausea and vomiting.  Endocrine: Negative.   Genitourinary: Negative.  Negative for difficulty urinating.  Musculoskeletal:  Negative for arthralgias and myalgias.  Skin: Negative.  Negative for color change.  Neurological:  Negative for dizziness, weakness, light-headedness and headaches.  Hematological:  Negative for adenopathy. Does not bruise/bleed easily.  Psychiatric/Behavioral:  Positive for dysphoric mood and sleep disturbance. Negative for agitation, behavioral problems, confusion, decreased concentration, hallucinations, self-injury and suicidal ideas. The patient is nervous/anxious. The patient is not hyperactive.     Objective:  BP 136/86 (BP Location: Right Arm, Patient Position: Sitting, Cuff Size: Large)   Pulse 71   Temp 98.1 F (36.7 C) (Oral)   Resp 16   Ht 5' 11"  (1.803 m)   Wt 222 lb (100.7 kg)   SpO2 98%   BMI 30.96 kg/m   BP Readings from Last 3 Encounters:  11/03/21 136/86  10/01/21 122/80  09/28/21 (!) 164/92    Wt Readings from Last 3 Encounters:  11/03/21 222 lb (100.7 kg)  10/01/21 220 lb 9.6 oz (100.1 kg)  09/28/21 222 lb 0.1 oz (100.7 kg)    Physical Exam Vitals reviewed.  Constitutional:      Appearance: He is ill-appearing.  HENT:     Nose: Nose normal.     Mouth/Throat:     Mouth: Mucous membranes are moist.  Eyes:     Conjunctiva/sclera: Conjunctivae normal.  Cardiovascular:     Rate and Rhythm: Normal rate and regular rhythm.     Heart sounds: No murmur heard. Pulmonary:     Effort: Pulmonary effort is normal.     Breath sounds: No stridor. No wheezing, rhonchi or rales.  Abdominal:     General: Abdomen is flat.     Palpations: There is no mass.     Tenderness: There is no abdominal tenderness. There is no guarding.     Hernia: No hernia is present.  Musculoskeletal:        General: Normal range of motion.     Cervical back: Neck supple.     Right lower leg: No edema.     Left lower leg: No edema.  Lymphadenopathy:     Cervical: No cervical adenopathy.  Skin:    General: Skin is warm and dry.  Neurological:     General: No focal deficit present.     Mental Status: He is alert.  Psychiatric:        Attention and Perception: Attention and perception normal.        Mood and Affect: Mood is anxious and depressed. Affect is flat and angry.        Speech: He is communicative. Speech is not rapid and pressured, delayed or tangential.        Behavior: Behavior normal.        Thought Content: Thought content normal. Thought content is not paranoid or delusional. Thought content does not include homicidal or suicidal ideation.        Cognition and  Memory: Cognition normal.        Judgment: Judgment normal.    Lab Results  Component Value Date   WBC 4.7 11/03/2021   HGB 14.6 11/03/2021   HCT 42.7 11/03/2021   PLT 121.0 (L) 11/03/2021   GLUCOSE 173 (H) 11/03/2021   CHOL 150 08/17/2021   TRIG 71 08/17/2021   HDL 73 08/17/2021   LDLDIRECT 101.0 12/26/2018  LDLCALC 63 08/17/2021   ALT 41 11/03/2021   AST 74 (H) 11/03/2021   NA 139 11/03/2021   K 3.9 11/03/2021   CL 100 11/03/2021   CREATININE 0.79 11/03/2021   BUN 15 11/03/2021   CO2 27 11/03/2021   TSH 1.21 05/19/2021   PSA 4.07 (H) 11/03/2021   INR 1.1 (H) 05/19/2021   HGBA1C 4.4 (L) 12/24/2019    No results found.  Assessment & Plan:   Brittain was seen today for hypertension and depression.  Diagnoses and all orders for this visit:  Essential hypertension- His blood pressure is adequately well controlled. -     CBC with Differential/Platelet; Future -     Basic metabolic panel; Future -     Basic metabolic panel -     CBC with Differential/Platelet  Steatohepatitis- His AST is elevated.  This is likely caused by the alcohol intake. -     Hepatic function panel; Future -     Hepatic function panel  Benign prostatic hyperplasia without lower urinary tract symptoms- He has had an insignificant rise in his PSA. -     PSA; Future -     PSA  B12 deficiency- His H&H are normal. -     CBC with Differential/Platelet; Future -     CBC with Differential/Platelet  GAD (generalized anxiety disorder)- Will restart BuSpar and will increase the dose of trazodone. -     busPIRone (BUSPAR) 5 MG tablet; Take 1 tablet (5 mg total) by mouth 3 (three) times daily. -     traZODone (DESYREL) 150 MG tablet; Take 1 tablet (150 mg total) by mouth at bedtime.  Thiamine deficiency -     thiamine (VITAMIN B-1) 50 MG tablet; Take 1 tablet (50 mg total) by mouth daily.  Severe episode of recurrent major depressive disorder, without psychotic features ([place]) -     traZODone  (DESYREL) 150 MG tablet; Take 1 tablet (150 mg total) by mouth at bedtime.  Alcohol abuse- I have encouraged him to try to abstain from alcohol intake and to reconsider going back to rehab.  I have discontinued Tayvion Lauder. [facility]yl[place]'s busPIRone, traZODone, and LORazepam. I am also having him start on busPIRone and traZODone. Additionally, I am having him maintain his carvedilol, irbesartan, b complex vitamins, midodrine, rosuvastatin, amoxicillin, desvenlafaxine, gabapentin, indapamide, and thiamine.  Meds ordered this encounter  Medications   busPIRone (BUSPAR) 5 MG tablet    Sig: Take 1 tablet (5 mg total) by mouth 3 (three) times daily.    Dispense:  90 tablet    Refill:  0   thiamine (VITAMIN B-1) 50 MG tablet    Sig: Take 1 tablet (50 mg total) by mouth daily.    Dispense:  90 tablet    Refill:  1   traZODone (DESYREL) 150 MG tablet    Sig: Take 1 tablet (150 mg total) by mouth at bedtime.    Dispense:  90 tablet    Refill:  0      Follow-up: Return in about 3 months (around 02/01/2022).  Scarlette Calico, MD

## 2021-11-09 DIAGNOSIS — S0003XA Contusion of scalp, initial encounter: Secondary | ICD-10-CM | POA: Diagnosis not present

## 2021-11-09 DIAGNOSIS — F1020 Alcohol dependence, uncomplicated: Secondary | ICD-10-CM | POA: Diagnosis not present

## 2021-11-09 DIAGNOSIS — F17220 Nicotine dependence, chewing tobacco, uncomplicated: Secondary | ICD-10-CM | POA: Diagnosis not present

## 2021-11-09 DIAGNOSIS — R519 Headache, unspecified: Secondary | ICD-10-CM | POA: Diagnosis not present

## 2021-11-09 DIAGNOSIS — I10 Essential (primary) hypertension: Secondary | ICD-10-CM | POA: Diagnosis not present

## 2021-11-09 DIAGNOSIS — R251 Tremor, unspecified: Secondary | ICD-10-CM | POA: Diagnosis not present

## 2021-11-09 DIAGNOSIS — Z79899 Other long term (current) drug therapy: Secondary | ICD-10-CM | POA: Diagnosis not present

## 2021-11-09 DIAGNOSIS — S0990XA Unspecified injury of head, initial encounter: Secondary | ICD-10-CM | POA: Diagnosis not present

## 2021-11-16 ENCOUNTER — Ambulatory Visit: Payer: PPO | Admitting: Internal Medicine

## 2021-11-29 ENCOUNTER — Other Ambulatory Visit: Payer: Self-pay | Admitting: Internal Medicine

## 2021-11-29 DIAGNOSIS — F411 Generalized anxiety disorder: Secondary | ICD-10-CM

## 2021-12-07 DIAGNOSIS — F1020 Alcohol dependence, uncomplicated: Secondary | ICD-10-CM | POA: Diagnosis not present

## 2021-12-08 DIAGNOSIS — F1020 Alcohol dependence, uncomplicated: Secondary | ICD-10-CM | POA: Diagnosis not present

## 2021-12-09 DIAGNOSIS — F1020 Alcohol dependence, uncomplicated: Secondary | ICD-10-CM | POA: Diagnosis not present

## 2021-12-11 DIAGNOSIS — F1020 Alcohol dependence, uncomplicated: Secondary | ICD-10-CM | POA: Diagnosis not present

## 2021-12-12 ENCOUNTER — Encounter: Payer: Self-pay | Admitting: Internal Medicine

## 2021-12-14 DIAGNOSIS — F1020 Alcohol dependence, uncomplicated: Secondary | ICD-10-CM | POA: Diagnosis not present

## 2021-12-15 ENCOUNTER — Emergency Department (HOSPITAL_COMMUNITY): Payer: PPO

## 2021-12-15 ENCOUNTER — Emergency Department (HOSPITAL_COMMUNITY)
Admission: EM | Admit: 2021-12-15 | Discharge: 2021-12-15 | Disposition: A | Discharge status: Home / Self Care | Payer: PPO | Attending: Emergency Medicine | Admitting: Emergency Medicine

## 2021-12-15 DIAGNOSIS — Y901 Blood alcohol level of 20-39 mg/100 ml: Secondary | ICD-10-CM | POA: Diagnosis not present

## 2021-12-15 DIAGNOSIS — M545 Low back pain, unspecified: Secondary | ICD-10-CM | POA: Diagnosis not present

## 2021-12-15 DIAGNOSIS — I672 Cerebral atherosclerosis: Secondary | ICD-10-CM | POA: Diagnosis not present

## 2021-12-15 DIAGNOSIS — R42 Dizziness and giddiness: Secondary | ICD-10-CM | POA: Insufficient documentation

## 2021-12-15 DIAGNOSIS — R519 Headache, unspecified: Secondary | ICD-10-CM | POA: Diagnosis not present

## 2021-12-15 DIAGNOSIS — G319 Degenerative disease of nervous system, unspecified: Secondary | ICD-10-CM | POA: Diagnosis not present

## 2021-12-15 DIAGNOSIS — F10239 Alcohol dependence with withdrawal, unspecified: Secondary | ICD-10-CM | POA: Diagnosis not present

## 2021-12-15 DIAGNOSIS — R531 Weakness: Secondary | ICD-10-CM | POA: Diagnosis not present

## 2021-12-15 DIAGNOSIS — W19XXXA Unspecified fall, initial encounter: Secondary | ICD-10-CM | POA: Diagnosis not present

## 2021-12-15 DIAGNOSIS — I10 Essential (primary) hypertension: Secondary | ICD-10-CM | POA: Diagnosis not present

## 2021-12-15 DIAGNOSIS — Z79899 Other long term (current) drug therapy: Secondary | ICD-10-CM | POA: Diagnosis not present

## 2021-12-15 DIAGNOSIS — R296 Repeated falls: Secondary | ICD-10-CM | POA: Insufficient documentation

## 2021-12-15 DIAGNOSIS — R251 Tremor, unspecified: Secondary | ICD-10-CM | POA: Diagnosis not present

## 2021-12-15 DIAGNOSIS — F10930 Alcohol use, unspecified with withdrawal, uncomplicated: Secondary | ICD-10-CM

## 2021-12-15 DIAGNOSIS — G4489 Other headache syndrome: Secondary | ICD-10-CM | POA: Diagnosis not present

## 2021-12-15 DIAGNOSIS — R3915 Urgency of urination: Secondary | ICD-10-CM | POA: Insufficient documentation

## 2021-12-15 DIAGNOSIS — R350 Frequency of micturition: Secondary | ICD-10-CM | POA: Diagnosis not present

## 2021-12-15 LAB — CBC WITH DIFFERENTIAL/PLATELET
Abs Immature Granulocytes: 0.04 10*3/uL (ref 0.00–0.07)
Basophils Absolute: 0 10*3/uL (ref 0.0–0.1)
Basophils Relative: 0 %
Eosinophils Absolute: 0.1 10*3/uL (ref 0.0–0.5)
Eosinophils Relative: 1 %
HCT: 38.8 % — ABNORMAL LOW (ref 39.0–52.0)
Hemoglobin: 13.9 g/dL (ref 13.0–17.0)
Immature Granulocytes: 1 %
Lymphocytes Relative: 16 %
Lymphs Abs: 1.2 10*3/uL (ref 0.7–4.0)
MCH: 36.8 pg — ABNORMAL HIGH (ref 26.0–34.0)
MCHC: 35.8 g/dL (ref 30.0–36.0)
MCV: 102.6 fL — ABNORMAL HIGH (ref 80.0–100.0)
Monocytes Absolute: 0.6 10*3/uL (ref 0.1–1.0)
Monocytes Relative: 8 %
Neutro Abs: 5.4 10*3/uL (ref 1.7–7.7)
Neutrophils Relative %: 74 %
Platelets: 128 10*3/uL — ABNORMAL LOW (ref 150–400)
RBC: 3.78 MIL/uL — ABNORMAL LOW (ref 4.22–5.81)
RDW: 12.3 % (ref 11.5–15.5)
WBC: 7.3 10*3/uL (ref 4.0–10.5)
nRBC: 0 % (ref 0.0–0.2)

## 2021-12-15 LAB — URINALYSIS, ROUTINE W REFLEX MICROSCOPIC
Bilirubin Urine: NEGATIVE
Glucose, UA: NEGATIVE mg/dL
Ketones, ur: NEGATIVE mg/dL
Leukocytes,Ua: NEGATIVE
Nitrite: NEGATIVE
Protein, ur: NEGATIVE mg/dL
Specific Gravity, Urine: >1.03 — ABNORMAL HIGH (ref 1.005–1.030)
pH: 5.5 (ref 5.0–8.0)

## 2021-12-15 LAB — BASIC METABOLIC PANEL
Anion gap: 13 (ref 5–15)
BUN: 10 mg/dL (ref 8–23)
CO2: 25 mmol/L (ref 22–32)
Calcium: 9.1 mg/dL (ref 8.9–10.3)
Chloride: 97 mmol/L — ABNORMAL LOW (ref 98–111)
Creatinine, Ser: 0.77 mg/dL (ref 0.61–1.24)
GFR, Estimated: >60 mL/min (ref >60)
Glucose, Bld: 82 mg/dL (ref 70–99)
Potassium: 3.8 mmol/L (ref 3.5–5.1)
Sodium: 135 mmol/L (ref 135–145)

## 2021-12-15 LAB — URINALYSIS, MICROSCOPIC (REFLEX): Squamous Epithelial / HPF: NONE SEEN (ref 0–5)

## 2021-12-15 LAB — ETHANOL: Alcohol, Ethyl (B): 30 mg/dL — ABNORMAL HIGH (ref <10)

## 2021-12-15 MED ORDER — SODIUM CHLORIDE 0.9 % IV BOLUS
1000.0000 mL | Freq: Once | INTRAVENOUS | Status: AC
  Administered 2021-12-15: 1000 mL via INTRAVENOUS

## 2021-12-15 MED ORDER — MECLIZINE HCL 25 MG PO TABS
25.0000 mg | ORAL_TABLET | Freq: Once | ORAL | Status: AC
  Administered 2021-12-15: 25 mg via ORAL
  Filled 2021-12-15: qty 1

## 2021-12-15 MED ORDER — MECLIZINE HCL 25 MG PO TABS: 25.0000 mg | ORAL_TABLET | Freq: Three times a day (TID) | ORAL | 0 refills | Status: DC | PRN

## 2021-12-15 MED ORDER — LORAZEPAM 2 MG/ML IJ SOLN
1.0000 mg | Freq: Once | INTRAMUSCULAR | Status: AC
Start: 2021-12-15 — End: 2021-12-15
  Administered 2021-12-15: 1 mg via INTRAVENOUS
  Filled 2021-12-15: qty 1

## 2021-12-15 MED ORDER — LORAZEPAM 2 MG/ML IJ SOLN
1.0000 mg | Freq: Once | INTRAMUSCULAR | Status: AC
  Administered 2021-12-15: 1 mg via INTRAVENOUS
  Filled 2021-12-15: qty 1

## 2021-12-15 MED ORDER — CHLORDIAZEPOXIDE HCL 25 MG PO CAPS: ORAL_CAPSULE | ORAL | 0 refills | Status: DC

## 2021-12-15 MED ORDER — LORAZEPAM 1 MG PO TABS
1.0000 mg | ORAL_TABLET | Freq: Once | ORAL | Status: AC
  Administered 2021-12-15: 1 mg via ORAL
  Filled 2021-12-15: qty 1

## 2021-12-15 NOTE — ED Provider Triage Note (Signed)
Emergency Medicine Provider Triage Evaluation Note  Lee Lee , a 71 y.o. male  was evaluated in triage.  Pt complains of dizziness.  Patient is accompanied by wife.  Patient states that for 2 weeks he has had dizziness and falls.  Complains that the room is spinning intermittently over the past 2 weeks.  Feels unsteady on his feet and has had multiple falls.  He also endorses passing out for short periods of time.  He does endorse frontal headache and has had intermittent episodes of nausea without vomiting.  He denies any fevers.  He denies any head trauma prior to the dizziness beginning 2 weeks ago.  Wife is at the bedside who states that not all of his information is accurate.  She states that he has fallen up to 6 or 7 times.  She has not seen him actually pass out.  She states that he is fallen getting out of a car, out of bed and walking down the hallway.  He has a resting tremor in his bilateral hands that he states is preceded the falls.  He has not been evaluated for this previously..  Review of Systems  Positive: See above Negative:   Physical Exam  BP 129/88 (BP Location: Left Arm)    Pulse 70    Temp 98.5 F (36.9 C) (Oral)    Resp 16    SpO2 98%  Gen:   Awake, no distress   Resp:  Normal effort  MSK:   Moves extremities without difficulty  Other:  Resting tremor in bilateral hands.  Horizontal nystagmus.  No other focal neurological deficits.  Medical Decision Making  Medically screening exam initiated at 11:10 AM.  Appropriate orders placed.  Lee Lee was informed that the remainder of the evaluation will be completed by another provider, this initial triage assessment does not replace that evaluation, and the importance of remaining in the ED until their evaluation is complete.     Mickie Hillier, PA-C 12/15/21 1112

## 2021-12-15 NOTE — ED Notes (Signed)
EDP notified that pt was steady when ambulating and ambulated with no assistance.

## 2021-12-15 NOTE — ED Notes (Signed)
Pt ambulated in the hallway with no difficulty. Gate was steady at this time

## 2021-12-15 NOTE — ED Provider Notes (Signed)
[facility] EMERGENCY DEPARTMENT Provider Note   CSN: [id number] Arrival date & time: 12/15/21  1038     History  Chief Complaint  Patient presents with   Dizziness   Headache    Lee Lee is a 71 y.o. male.  The history is provided by the patient and medical records. No language interpreter was used.  Dizziness Associated symptoms: headaches   Headache Associated symptoms: dizziness    71 year old male with significant pertinent history including atrial flutter not on any anticoagulant, SVT, hypertension, ascending aortic aneurysm, hyperlipidemia, brought here via EMS from home with complaints of headache and dizziness.  Patient report for the past 2 weeks he has had persistent headache.  He described headache as a bandlike sensation across his forehead with associated bouts of dizziness in which he described more as feeling imbalance especially with walking.  States he has fallen several times due to his imbalance but denies any significant injury from his fall denies hitting his head or loss of consciousness.  He also endorsed occasional chills and feeling shaky.  He endorsed increased urinary frequency and some urgency as well.  He denies fever, runny nose sneezing coughing chest pain shortness of breath abdominal pain or back pain.  His wife noticed that he is more forgetful and more diffuse than baseline.  Patient does admits to regular alcohol use at least 3 to 4 glasses of wine daily but did try to quit about a week ago.  He states that he has tried to go to detox for this.  Furthermore, patient also admits feeling depressed without SI HI.  Home Medications Prior to Admission medications   Medication Sig Start Date End Date Taking? Authorizing Provider  amoxicillin (AMOXIL) 500 MG capsule Take 500 mg by mouth 3 (three) times daily. 09/28/21   [provider]  b complex vitamins capsule Take 1 capsule by mouth daily.    [provider]   busPIRone (BUSPAR) 5 MG tablet TAKE ONE TABLET BY MOUTH THREE TIMES A DAY 11/29/21   Janith Lima, MD  carvedilol (COREG) 12.5 MG tablet TAKE ONE TABLET BY MOUTH TWICE A DAY WITH A MEAL Patient taking differently: Take 12.5 mg by mouth daily. TAKE ONE TABLET BY MOUTH ONCE A DAY WITH A MEAL 12/11/20   Baldwin Jamaica, PA-C  desvenlafaxine (PRISTIQ) 50 MG 24 hr tablet Take 1 tablet (50 mg total) by mouth daily. 10/01/21   Hoyt Koch, MD  gabapentin (NEURONTIN) 300 MG capsule Take 1 capsule (300 mg total) by mouth at bedtime. 10/01/21   Hoyt Koch, MD  indapamide (LOZOL) 2.5 MG tablet TAKE ONE TABLET BY MOUTH DAILY 10/29/21   Janith Lima, MD  irbesartan (AVAPRO) 300 MG tablet Take 1 tablet (300 mg total) by mouth daily. 12/11/20   Baldwin Jamaica, PA-C  midodrine (PROAMATINE) 10 MG tablet Take 1 tablet (10 mg total) by mouth 3 (three) times daily as needed (While awake and on your feet to prevent dizziness). 08/21/21   Adrian Prows, MD  rosuvastatin (CRESTOR) 5 MG tablet Take 1 tablet (5 mg total) by mouth daily. 08/21/21 08/16/22  Adrian Prows, MD  thiamine (VITAMIN B-1) 50 MG tablet Take 1 tablet (50 mg total) by mouth daily. 11/03/21   Janith Lima, MD  traZODone (DESYREL) 150 MG tablet Take 1 tablet (150 mg total) by mouth at bedtime. 11/03/21   Janith Lima, MD  Pitavastatin Calcium (LIVALO) 2 MG TABS Take 1 tablet (  2 mg total) by mouth daily. Patient not taking: Reported on 07/17/2021 12/31/20   Janith Lima, MD      Allergies    Patient has no known allergies.    Review of Systems   Review of Systems  Neurological:  Positive for dizziness and headaches.  All other systems reviewed and are negative.  Physical Exam Updated Vital Signs BP 129/88 (BP Location: Left Arm)    Pulse 70    Temp 98.5 F (36.9 C) (Oral)    Resp 16    SpO2 98%  Physical Exam Vitals and nursing note reviewed.  Constitutional:      General: He is not in acute distress.    Appearance: He  is well-developed.  HENT:     Head: Atraumatic.  Eyes:     Extraocular Movements: Extraocular movements intact.     Conjunctiva/sclera: Conjunctivae normal.     Pupils: Pupils are equal, round, and reactive to light.     Comments: Fatigable horizontal nystagmus favoring the right side  Cardiovascular:     Rate and Rhythm: Normal rate and regular rhythm.     Heart sounds: Normal heart sounds.  Pulmonary:     Effort: Pulmonary effort is normal.     Breath sounds: Normal breath sounds.  Abdominal:     Palpations: Abdomen is soft.  Musculoskeletal:     Cervical back: Neck supple.     Comments: Exertional arm tremors bilaterally  Skin:    Findings: No rash.  Neurological:     Mental Status: He is alert and oriented to person, place, and time.     GCS: GCS eye subscore is 4. GCS verbal subscore is 5. GCS motor subscore is 6.     Cranial Nerves: Cranial nerves 2-12 are intact.     Sensory: Sensation is intact.     Motor: Weakness and tremor present.     Comments: Gait unsteady    ED Results / Procedures / Treatments   Labs (all labs ordered are listed, but only abnormal results are displayed) Labs Reviewed  BASIC METABOLIC PANEL - Abnormal; Notable for the following components:      Result Value   Chloride 97 (*)    All other components within normal limits  CBC WITH DIFFERENTIAL/PLATELET - Abnormal; Notable for the following components:   RBC 3.78 (*)    HCT 38.8 (*)    MCV 102.6 (*)    MCH 36.8 (*)    Platelets 128 (*)    All other components within normal limits  URINALYSIS, ROUTINE W REFLEX MICROSCOPIC  ETHANOL  VITAMIN B1    EKG EKG Interpretation  Date/Time:  Tuesday December 15 2021 10:56:42 EST Ventricular Rate:  70 PR Interval:  162 QRS Duration: 92 QT Interval:  456 QTC Calculation: 492 R Axis:   57 Text Interpretation: Normal sinus rhythm Nonspecific T wave abnormality Prolonged QT Abnormal ECG When compared with ECG of 27-Sep-2021 17:56, PREVIOUS ECG IS  PRESENT Similar to Oct 2022 tracing Confirmed by Nanda Quinton [phone]) on 12/15/2021 1:15:57 PM ED ECG REPORT   Date: 12/15/2021  Rate: 70  Rhythm: normal sinus rhythm  QRS Axis: normal  Intervals: QT prolonged  ST/T Wave abnormalities: nonspecific T wave changes  Conduction Disutrbances:none  Narrative Interpretation:   Old EKG Reviewed: unchanged  I have personally reviewed the EKG tracing and agree with the computerized printout as noted.   Radiology CT Head Wo Contrast  Result Date: 12/15/2021 CLINICAL DATA:  Dizziness, persistent/recurrent, cardiac  or vascular cause suspected EXAM: CT HEAD WITHOUT CONTRAST TECHNIQUE: Contiguous axial images were obtained from the base of the skull through the vertex without intravenous contrast. RADIATION DOSE REDUCTION: This exam was performed according to the departmental dose-optimization program which includes automated exposure control, adjustment of the mA and/or kV according to patient size and/or use of iterative reconstruction technique. COMPARISON:  None. FINDINGS: Brain: No evidence of acute infarction, hemorrhage, extra-axial collection, ventriculomegaly, or mass effect. Generalized cerebral atrophy. Periventricular white matter low attenuation likely secondary to microangiopathy. Vascular: Cerebrovascular atherosclerotic calcifications are noted. Skull: Negative for fracture or focal lesion. Sinuses/Orbits: Visualized portions of the orbits are unremarkable. Visualized portions of the paranasal sinuses are unremarkable. Visualized portions of the mastoid air cells are unremarkable. Other: None. IMPRESSION: 1. No acute intracranial pathology. 2. Chronic microvascular disease and cerebral atrophy. Electronically Signed   By: Kathreen Devoid M.D.   On: 12/15/2021 11:57    Procedures Procedures    Medications Ordered in ED Medications - No data to display  ED Course/ Medical Decision Making/ A&P                           Medical Decision  Making Amount and/or Complexity of Data Reviewed Labs: ordered. Radiology: ordered.  Risk Prescription drug management.   BP (!) 140/98    Pulse 72    Temp 98.5 F (36.9 C) (Oral)    Resp 13    SpO2 98%   1:18 PM This is a 71 year old male with multiple comorbidities including atrial flutter, alcohol abuse, iron deficiency anemia, anxiety hypertension presenting with complaints of persistent headache as well as having bouts of dizziness and recurrent falls for the past few weeks.  His symptoms are suggestive of alcohol withdrawals as he admits to quitting alcohol use approximately a week ago.  Patient is able to ambulate however very unsteady on his feet.  His presentation encompasses a wide range of differential diagnosis from posterior circulation stroke, to Wernicke's encephalopathy from alcohol use.  Other differential including peripheral vertigo, cardiac arrhythmia, metabolic derangement from underlying infection.  Patient at this time does exhibit exertional tremors on exam as well as having horizontal nystagmus however he is alert and oriented x3 and answers most of the questions appropriately.  Given his risk profile, plan to obtain brain MRI, to rule out posterior circulation stroke.  He has history of thiamine deficiency therefore I will also check thiamine level as his symptoms could be due to Wernicke's encephalopathy.  We will give IV fluid, meclizine, and Ativan.  We will check CIWA score to assess for alcohol withdrawal symptoms.  CAre discussed with DR. Long  1:23 PM Labs, EKG, and imaging were independently reviewed and interpreted by me.  His EKG shows a slightly prolonged QT but appears unchanged from prior.  No other concerning arrhythmia noted.  Head CT scan without any acute intracranial pathology.  He has normal H&H and normal WBC, has a normal basic metabolic panel.  3:14 PM Currently ETOH, UA, and thiamin level are pending.  Brain MRI have been ordered.  Pt sign out to  oncoming team who will f/u on MRI result, follow up on labs, reassess pt and determine disposition.          Final Clinical Impression(s) / ED Diagnoses Final diagnoses:  None    Rx / DC Orders ED Discharge Orders     None         Domenic Moras,  PA-C 12/15/21 1528    Margette Fast, MD 12/16/21 [phone]

## 2021-12-15 NOTE — ED Provider Notes (Signed)
Care assumed from PA Domenic Moras at shift change, please see his note for full details, but in brief Lee Lee is a 71 y.o. male arrives with complaints of headache and dizziness.  Notably patient also recently stopped drinking alcohol about 1 week ago.  Reports some tremulousness and anxiety.  Laboratory work-up has been overall reassuring, ethanol level of 30, CT of the head was unremarkable.  At shift change MRI pending to rule out stroke.  Patient was somewhat tremulous and unsteady initially with ambulation, received Ativan and meclizine and now seems to be feeling better, will reassess this pending MRI.  BP (!) 163/101    Pulse 62    Temp 98.5 F (36.9 C) (Oral)    Resp 14    SpO2 97%   .   Procedures  Procedures  ED Course / MDM    CT Head Wo Contrast  Result Date: 12/15/2021 CLINICAL DATA:  Dizziness, persistent/recurrent, cardiac or vascular cause suspected EXAM: CT HEAD WITHOUT CONTRAST TECHNIQUE: Contiguous axial images were obtained from the base of the skull through the vertex without intravenous contrast. RADIATION DOSE REDUCTION: This exam was performed according to the departmental dose-optimization program which includes automated exposure control, adjustment of the mA and/or kV according to patient size and/or use of iterative reconstruction technique. COMPARISON:  None. FINDINGS: Brain: No evidence of acute infarction, hemorrhage, extra-axial collection, ventriculomegaly, or mass effect. Generalized cerebral atrophy. Periventricular white matter low attenuation likely secondary to microangiopathy. Vascular: Cerebrovascular atherosclerotic calcifications are noted. Skull: Negative for fracture or focal lesion. Sinuses/Orbits: Visualized portions of the orbits are unremarkable. Visualized portions of the paranasal sinuses are unremarkable. Visualized portions of the mastoid air cells are unremarkable. Other: None. IMPRESSION: 1. No acute intracranial pathology. 2. Chronic  microvascular disease and cerebral atrophy. Electronically Signed   By: Kathreen Devoid M.D.   On: 12/15/2021 11:57   MR BRAIN WO CONTRAST  Result Date: 12/15/2021 CLINICAL DATA:  Initial evaluation for acute dizziness. EXAM: MRI HEAD WITHOUT CONTRAST TECHNIQUE: Multiplanar, multiecho pulse sequences of the brain and surrounding structures were obtained without intravenous contrast. COMPARISON:  Prior CT from earlier the same day. FINDINGS: Brain: Diffuse prominence of the CSF containing spaces compatible with generalized cerebral atrophy, mildly advanced for age. No significant cerebral white matter disease. No abnormal foci of restricted diffusion to suggest acute or subacute ischemia. Gray-white matter differentiation maintained. No encephalomalacia to suggest chronic cortical infarction. No evidence for acute or chronic intracranial hemorrhage. No mass lesion, midline shift or mass effect. Mild ventricular prominence related to global parenchymal volume loss without hydrocephalus. No extra-axial fluid collection. Pituitary gland suprasellar region within normal limits. Midline structures intact. Vascular: Major intracranial vascular flow voids are maintained. Hypoplastic right vertebral artery noted. Skull and upper cervical spine: Craniocervical junction within normal limits. Bone marrow signal intensity normal. No scalp soft tissue abnormality. Sinuses/Orbits: Globes and orbital soft tissues within normal limits. Mild scattered mucosal thickening noted within the ethmoidal air cells and maxillary sinuses. Paranasal sinuses are otherwise clear. Trace bilateral mastoid effusions. Visualized nasopharynx unremarkable. Inner ear structures grossly normal. Other: None. IMPRESSION: 1. No acute intracranial abnormality. 2. Mildly advanced cerebral atrophy for age. Electronically Signed   By: Jeannine Boga M.D.   On: 12/15/2021 20:34     I have personally reviewed MRI independently and agree with radiologist  findings.  No evidence of acute stroke or other intracranial abnormality.  Mildly advanced cerebral atrophy for age.  On reevaluation patient reports dizziness is significantly improved  and he is no longer feeling tremulous.  I do suspect some of the symptoms may be related to alcohol withdrawal as he has improved significantly with Ativan.  CIWA scores have steadily improved throughout patient's ED stay.  Patient now ambulatory with steady gait and does not endorse any dizziness.  At this time I feel he is appropriate for discharge home will prescribe patient Librium taper as well as meclizine.  He endorses plans to continue and abstain from alcohol.  Discussed with patient that if the symptoms do not resolve after completing Librium taper she should follow-up with neurology for further evaluation of dizziness.  Patient and wife expressed understanding and agreement with plan.  Discharged home in good condition.       Jacqlyn Larsen, PA-C 02/54/27 0623    Lianne Cure, DO 76/28/31 2358

## 2021-12-15 NOTE — ED Triage Notes (Signed)
EMS stated, For a week he has been dizzy with headache and has been forgetting simple task. Pt has fallen a few times due to the dizziness.

## 2021-12-15 NOTE — ED Notes (Signed)
Patient transported to MRI 

## 2021-12-15 NOTE — Discharge Instructions (Addendum)
Take Librium taper as directed to help continue to improve any alcohol withdrawal symptoms.  It is extremely important that you do not drink alcohol with this medication.  If you decide to start drinking again stop this medication immediately.  You can also use meclizine as needed for dizziness.  You are continuing to experience dizziness after you have completed the Librium taper please call to schedule close follow-up appointment with neurology.    Your evaluation today was reassuring and MRI showed no evidence of stroke.  Return for new or worsening symptoms.

## 2021-12-16 ENCOUNTER — Other Ambulatory Visit: Payer: Self-pay

## 2021-12-16 ENCOUNTER — Encounter (HOSPITAL_COMMUNITY): Payer: Self-pay

## 2021-12-16 ENCOUNTER — Emergency Department (HOSPITAL_COMMUNITY)
Admission: EM | Admit: 2021-12-16 | Discharge: 2021-12-16 | Disposition: A | Discharge status: Home / Self Care | Payer: PPO | Attending: Emergency Medicine | Admitting: Emergency Medicine

## 2021-12-16 ENCOUNTER — Emergency Department (HOSPITAL_COMMUNITY): Payer: PPO

## 2021-12-16 DIAGNOSIS — S0081XA Abrasion of other part of head, initial encounter: Secondary | ICD-10-CM | POA: Diagnosis not present

## 2021-12-16 DIAGNOSIS — W19XXXA Unspecified fall, initial encounter: Secondary | ICD-10-CM | POA: Diagnosis not present

## 2021-12-16 DIAGNOSIS — Z043 Encounter for examination and observation following other accident: Secondary | ICD-10-CM | POA: Diagnosis not present

## 2021-12-16 DIAGNOSIS — W1809XA Striking against other object with subsequent fall, initial encounter: Secondary | ICD-10-CM | POA: Insufficient documentation

## 2021-12-16 DIAGNOSIS — R42 Dizziness and giddiness: Secondary | ICD-10-CM | POA: Diagnosis not present

## 2021-12-16 DIAGNOSIS — S0990XA Unspecified injury of head, initial encounter: Secondary | ICD-10-CM | POA: Diagnosis not present

## 2021-12-16 DIAGNOSIS — M549 Dorsalgia, unspecified: Secondary | ICD-10-CM | POA: Diagnosis not present

## 2021-12-16 DIAGNOSIS — F10239 Alcohol dependence with withdrawal, unspecified: Secondary | ICD-10-CM | POA: Diagnosis not present

## 2021-12-16 DIAGNOSIS — F10230 Alcohol dependence with withdrawal, uncomplicated: Secondary | ICD-10-CM | POA: Insufficient documentation

## 2021-12-16 DIAGNOSIS — E162 Hypoglycemia, unspecified: Secondary | ICD-10-CM | POA: Diagnosis not present

## 2021-12-16 DIAGNOSIS — I10 Essential (primary) hypertension: Secondary | ICD-10-CM | POA: Diagnosis not present

## 2021-12-16 DIAGNOSIS — F10930 Alcohol use, unspecified with withdrawal, uncomplicated: Secondary | ICD-10-CM

## 2021-12-16 DIAGNOSIS — Y901 Blood alcohol level of 20-39 mg/100 ml: Secondary | ICD-10-CM | POA: Insufficient documentation

## 2021-12-16 DIAGNOSIS — E161 Other hypoglycemia: Secondary | ICD-10-CM | POA: Diagnosis not present

## 2021-12-16 LAB — COMPREHENSIVE METABOLIC PANEL
ALT: 33 U/L (ref 0–44)
AST: 76 U/L — ABNORMAL HIGH (ref 15–41)
Albumin: 3.4 g/dL — ABNORMAL LOW (ref 3.5–5.0)
Alkaline Phosphatase: 63 U/L (ref 38–126)
Anion gap: 13 (ref 5–15)
BUN: 8 mg/dL (ref 8–23)
CO2: 20 mmol/L — ABNORMAL LOW (ref 22–32)
Calcium: 9 mg/dL (ref 8.9–10.3)
Chloride: 102 mmol/L (ref 98–111)
Creatinine, Ser: 0.82 mg/dL (ref 0.61–1.24)
GFR, Estimated: >60 mL/min (ref >60)
Glucose, Bld: 65 mg/dL — ABNORMAL LOW (ref 70–99)
Potassium: 4.6 mmol/L (ref 3.5–5.1)
Sodium: 135 mmol/L (ref 135–145)
Total Bilirubin: 1.6 mg/dL — ABNORMAL HIGH (ref 0.3–1.2)
Total Protein: 6.5 g/dL (ref 6.5–8.1)

## 2021-12-16 LAB — URINALYSIS, ROUTINE W REFLEX MICROSCOPIC
Bilirubin Urine: NEGATIVE
Glucose, UA: NEGATIVE mg/dL
Ketones, ur: 15 mg/dL — AB
Leukocytes,Ua: NEGATIVE
Nitrite: NEGATIVE
Protein, ur: NEGATIVE mg/dL
Specific Gravity, Urine: >1.03 — ABNORMAL HIGH (ref 1.005–1.030)
pH: 5.5 (ref 5.0–8.0)

## 2021-12-16 LAB — URINALYSIS, MICROSCOPIC (REFLEX): Squamous Epithelial / HPF: NONE SEEN (ref 0–5)

## 2021-12-16 LAB — MAGNESIUM: Magnesium: 1.9 mg/dL (ref 1.7–2.4)

## 2021-12-16 LAB — CBC WITH DIFFERENTIAL/PLATELET
Abs Immature Granulocytes: 0.03 10*3/uL (ref 0.00–0.07)
Basophils Absolute: 0 10*3/uL (ref 0.0–0.1)
Basophils Relative: 0 %
Eosinophils Absolute: 0 10*3/uL (ref 0.0–0.5)
Eosinophils Relative: 0 %
HCT: 38.6 % — ABNORMAL LOW (ref 39.0–52.0)
Hemoglobin: 13.2 g/dL (ref 13.0–17.0)
Immature Granulocytes: 0 %
Lymphocytes Relative: 13 %
Lymphs Abs: 1.3 10*3/uL (ref 0.7–4.0)
MCH: 36.1 pg — ABNORMAL HIGH (ref 26.0–34.0)
MCHC: 34.2 g/dL (ref 30.0–36.0)
MCV: 105.5 fL — ABNORMAL HIGH (ref 80.0–100.0)
Monocytes Absolute: 0.8 10*3/uL (ref 0.1–1.0)
Monocytes Relative: 8 %
Neutro Abs: 7.5 10*3/uL (ref 1.7–7.7)
Neutrophils Relative %: 79 %
Platelets: 145 10*3/uL — ABNORMAL LOW (ref 150–400)
RBC: 3.66 MIL/uL — ABNORMAL LOW (ref 4.22–5.81)
RDW: 12.5 % (ref 11.5–15.5)
WBC: 9.6 10*3/uL (ref 4.0–10.5)
nRBC: 0 % (ref 0.0–0.2)

## 2021-12-16 LAB — VITAMIN B1: Vitamin B1 (Thiamine): 133.5 nmol/L (ref 66.5–200.0)

## 2021-12-16 LAB — CBG MONITORING, ED
Glucose-Capillary: 65 mg/dL — ABNORMAL LOW (ref 70–99)
Glucose-Capillary: 79 mg/dL (ref 70–99)

## 2021-12-16 LAB — ETHANOL: Alcohol, Ethyl (B): <10 mg/dL (ref <10)

## 2021-12-16 MED ORDER — CHLORDIAZEPOXIDE HCL 25 MG PO CAPS
50.0000 mg | ORAL_CAPSULE | Freq: Once | ORAL | Status: AC
  Administered 2021-12-16: 50 mg via ORAL
  Filled 2021-12-16: qty 2

## 2021-12-16 MED ORDER — LACTATED RINGERS IV BOLUS
1000.0000 mL | Freq: Once | INTRAVENOUS | Status: AC
  Administered 2021-12-16: 1000 mL via INTRAVENOUS

## 2021-12-16 MED ORDER — THIAMINE HCL 100 MG PO TABS: 100.0000 mg | ORAL_TABLET | Freq: Every day | ORAL | 0 refills | Status: DC

## 2021-12-16 MED ORDER — LORAZEPAM 2 MG/ML IJ SOLN
1.0000 mg | Freq: Once | INTRAMUSCULAR | Status: AC
Start: 2021-12-16 — End: 2021-12-16
  Administered 2021-12-16: 1 mg via INTRAVENOUS
  Filled 2021-12-16: qty 1

## 2021-12-16 NOTE — ED Notes (Signed)
Patient transported to X-ray 

## 2021-12-16 NOTE — Discharge Instructions (Addendum)
Is very important to start taking the Librium that you were prescribed and was sent to the [facility] on [address].  This can help prevent alcohol withdrawal.  Use the resources to help find a detox facility.

## 2021-12-16 NOTE — ED Notes (Signed)
Pulse ambulated: 82 steady

## 2021-12-16 NOTE — ED Provider Notes (Signed)
[facility] EMERGENCY DEPARTMENT Provider Note   CSN: [id number] Arrival date & time: 12/16/21  1737     History  Chief Complaint  Patient presents with   Lee Lee is a 71 y.o. male.  HPI 71 year old male presents after 2 falls today.  Patient was in the emergency department last night was feeling okay after being given Ativan from a dizziness perspective.  He states he has been drinking 1 bottle of wine per day for a long time, but decided to stop 5 days ago.  He has not resumed.  Was in the emergency [name] for dizziness yesterday and had an extensive work-up that was ultimately negative and he was discharged home.  He was able to ambulate but he states that his dizziness and weakness was better because he was given Ativan.  He has not picked up the prescriptions that were given to him.  This morning around 10 AM he was sitting on the toilet and when he stood up he just kept going and fell over and hit his head on the bathtub.  He thinks he lost consciousness.  He had to crawl for about an hour until someone to help get him up to the bed.  He was able to walk to the bathroom this afternoon but was dizzy.  When he sat down on the toilet and then stood back up he again fell over.  He does not think he injured himself that time.  EMS was called and brought him to the emergency department.  He is still feeling dizzy like the room is spinning.  He denies any syncope/lightheadedness.  No chest pain.  No focal weakness but he feels diffusely weak.  He was put in a c-collar but denies neck pain.  Home Medications Prior to Admission medications   Medication Sig Start Date End Date Taking? Authorizing Provider  thiamine 100 MG tablet Take 1 tablet (100 mg total) by mouth daily. 12/16/21  Yes Sherwood Gambler, MD  amoxicillin (AMOXIL) 500 MG capsule Take 500 mg by mouth 3 (three) times daily. Patient not taking: Reported on 12/15/2021 09/28/21   [provider]  b complex vitamins capsule Take 1 capsule by mouth daily.    [provider]  busPIRone (BUSPAR) 5 MG tablet TAKE ONE TABLET BY MOUTH THREE TIMES A DAY 11/29/21   Janith Lima, MD  carvedilol (COREG) 12.5 MG tablet TAKE ONE TABLET BY MOUTH TWICE A DAY WITH A MEAL Patient taking differently: Take 12.5 mg by mouth daily. TAKE ONE TABLET BY MOUTH ONCE A DAY WITH A MEAL 12/11/20   Baldwin Jamaica, PA-C  chlordiazePOXIDE (LIBRIUM) 25 MG capsule 71m PO TID x 1D, then 25-548mPO BID X 1D, then 25-501mO QD X 1D 12/15/21   ForJacqlyn LarsenA-C  desvenlafaxine (PRISTIQ) 50 MG 24 hr tablet Take 1 tablet (50 mg total) by mouth daily. 10/01/21   CraHoyt KochD  gabapentin (NEURONTIN) 300 MG capsule Take 1 capsule (300 mg total) by mouth at bedtime. 10/01/21   CraHoyt KochD  indapamide (LOZOL) 2.5 MG tablet TAKE ONE TABLET BY MOUTH DAILY 10/29/21   JonJanith LimaD  irbesartan (AVAPRO) 300 MG tablet Take 1 tablet (300 mg total) by mouth daily. Patient not taking: Reported on 12/15/2021 12/11/20   UrsBaldwin JamaicaA-C  LORazepam (ATIVAN) 2 MG tablet Take 2 mg by mouth daily as needed for seizure. 12/09/21  [provider]  meclizine (ANTIVERT) 25 MG tablet Take 1 tablet (25 mg total) by mouth 3 (three) times daily as needed for dizziness. 12/15/21   Jacqlyn Larsen, PA-C  midodrine (PROAMATINE) 10 MG tablet Take 1 tablet (10 mg total) by mouth 3 (three) times daily as needed (While awake and on your feet to prevent dizziness). 08/21/21   Adrian Prows, MD  naltrexone (DEPADE) 50 MG tablet Take 50 mg by mouth daily. 12/09/21   [provider]  rosuvastatin (CRESTOR) 5 MG tablet Take 1 tablet (5 mg total) by mouth daily. Patient not taking: Reported on 12/15/2021 08/21/21 08/16/22  Adrian Prows, MD  traZODone (DESYREL) 150 MG tablet Take 1 tablet (150 mg total) by mouth at bedtime. Patient taking differently: Take 150 mg by mouth at bedtime as needed for sleep. 11/03/21    Janith Lima, MD  Pitavastatin Calcium (LIVALO) 2 MG TABS Take 1 tablet (2 mg total) by mouth daily. Patient not taking: Reported on 07/17/2021 12/31/20   Janith Lima, MD      Allergies    Patient has no known allergies.    Review of Systems   Review of Systems  Eyes:  Negative for visual disturbance.  Cardiovascular:  Negative for chest pain.  Gastrointestinal:  Negative for abdominal pain.  Musculoskeletal:  Negative for neck pain.  Neurological:  Positive for dizziness. Negative for light-headedness and headaches.   Physical Exam Updated Vital Signs BP (!) 142/88 (BP Location: Left Arm)    Pulse 74    Temp 97.8 F (36.6 C) (Oral)    Resp 16    SpO2 99%  Physical Exam Vitals and nursing note reviewed.  Constitutional:      General: He is not in acute distress.    Appearance: He is well-developed. He is not ill-appearing or diaphoretic.     Interventions: Cervical collar in place.  HENT:     Head: Normocephalic. Abrasion present.      Comments: No maxillofacial tenderness Eyes:     Extraocular Movements: Extraocular movements intact.     Pupils: Pupils are equal, round, and reactive to light.  Cardiovascular:     Rate and Rhythm: Normal rate and regular rhythm.     Heart sounds: Normal heart sounds.  Pulmonary:     Effort: Pulmonary effort is normal.     Breath sounds: Normal breath sounds.  Abdominal:     Palpations: Abdomen is soft.     Tenderness: There is no abdominal tenderness.  Musculoskeletal:     Left shoulder: Tenderness (mild, anterior) present. Normal range of motion.     Cervical back: Normal range of motion and neck supple. No spinous process tenderness or muscular tenderness.  Skin:    General: Skin is warm and dry.  Neurological:     Mental Status: He is alert.     Comments: Awake, alert, oriented to person, place, time and situation. CN 3-12 grossly intact. 5/5 strength in all 4 extremities. Grossly normal sensation.  He has tremor with  finger-to-nose bilaterally though he is able to do it slowly.   ED Results / Procedures / Treatments   Labs (all labs ordered are listed, but only abnormal results are displayed) Labs Reviewed  COMPREHENSIVE METABOLIC PANEL - Abnormal; Notable for the following components:      Result Value   CO2 20 (*)    Glucose, Bld 65 (*)    Albumin 3.4 (*)    AST 76 (*)  Total Bilirubin 1.6 (*)    All other components within normal limits  CBC WITH DIFFERENTIAL/PLATELET - Abnormal; Notable for the following components:   RBC 3.66 (*)    HCT 38.6 (*)    MCV 105.5 (*)    MCH 36.1 (*)    Platelets 145 (*)    All other components within normal limits  URINALYSIS, ROUTINE W REFLEX MICROSCOPIC - Abnormal; Notable for the following components:   Specific Gravity, Urine >1.030 (*)    Hgb urine dipstick TRACE (*)    Ketones, ur 15 (*)    All other components within normal limits  URINALYSIS, MICROSCOPIC (REFLEX) - Abnormal; Notable for the following components:   Bacteria, UA RARE (*)    All other components within normal limits  CBG MONITORING, ED - Abnormal; Notable for the following components:   Glucose-Capillary 65 (*)    All other components within normal limits  ETHANOL  MAGNESIUM  CBG MONITORING, ED    EKG EKG Interpretation  Date/Time:  Wednesday December 16 2021 19:36:12 EST Ventricular Rate:  69 PR Interval:  180 QRS Duration: 111 QT Interval:  437 QTC Calculation: 469 R Axis:   27 Text Interpretation: Sinus rhythm Inferior infarct, old Lateral leads are also involved similar to yesterday Confirmed by Sherwood Gambler [phone]) on 12/16/2021 10:12:13 PM  Radiology CT Head Wo Contrast  Result Date: 12/16/2021 CLINICAL DATA:  [place], hit head on floor, abrasion EXAM: CT HEAD WITHOUT CONTRAST TECHNIQUE: Contiguous axial images were obtained from the base of the skull through the vertex without intravenous contrast. RADIATION DOSE REDUCTION: This exam was performed according to the  departmental dose-optimization program which includes automated exposure control, adjustment of the mA and/or kV according to patient size and/or use of iterative reconstruction technique. COMPARISON:  12/15/2021 FINDINGS: Brain: No acute infarct or hemorrhage. Lateral ventricles and midline structures are unremarkable. No acute extra-axial fluid collections. No mass effect. Vascular: No hyperdense vessel or unexpected calcification. Skull: Normal. Negative for fracture or focal lesion. Sinuses/Orbits: No acute finding. Other: None. IMPRESSION: 1. Stable head CT, no acute intracranial process. Electronically Signed   By: Randa Ngo M.D.   On: 12/16/2021 19:11   CT Head Wo Contrast  Result Date: 12/15/2021 CLINICAL DATA:  Dizziness, persistent/recurrent, cardiac or vascular cause suspected EXAM: CT HEAD WITHOUT CONTRAST TECHNIQUE: Contiguous axial images were obtained from the base of the skull through the vertex without intravenous contrast. RADIATION DOSE REDUCTION: This exam was performed according to the departmental dose-optimization program which includes automated exposure control, adjustment of the mA and/or kV according to patient size and/or use of iterative reconstruction technique. COMPARISON:  None. FINDINGS: Brain: No evidence of acute infarction, hemorrhage, extra-axial collection, ventriculomegaly, or mass effect. Generalized cerebral atrophy. Periventricular white matter low attenuation likely secondary to microangiopathy. Vascular: Cerebrovascular atherosclerotic calcifications are noted. Skull: Negative for fracture or focal lesion. Sinuses/Orbits: Visualized portions of the orbits are unremarkable. Visualized portions of the paranasal sinuses are unremarkable. Visualized portions of the mastoid air cells are unremarkable. Other: None. IMPRESSION: 1. No acute intracranial pathology. 2. Chronic microvascular disease and cerebral atrophy. Electronically Signed   By: Kathreen Devoid M.D.   On:  12/15/2021 11:57   MR BRAIN WO CONTRAST  Result Date: 12/15/2021 CLINICAL DATA:  Initial evaluation for acute dizziness. EXAM: MRI HEAD WITHOUT CONTRAST TECHNIQUE: Multiplanar, multiecho pulse sequences of the brain and surrounding structures were obtained without intravenous contrast. COMPARISON:  Prior CT from earlier the same day. FINDINGS: Brain: Diffuse prominence of  the CSF containing spaces compatible with generalized cerebral atrophy, mildly advanced for age. No significant cerebral white matter disease. No abnormal foci of restricted diffusion to suggest acute or subacute ischemia. Gray-white matter differentiation maintained. No encephalomalacia to suggest chronic cortical infarction. No evidence for acute or chronic intracranial hemorrhage. No mass lesion, midline shift or mass effect. Mild ventricular prominence related to global parenchymal volume loss without hydrocephalus. No extra-axial fluid collection. Pituitary gland suprasellar region within normal limits. Midline structures intact. Vascular: Major intracranial vascular flow voids are maintained. Hypoplastic right vertebral artery noted. Skull and upper cervical spine: Craniocervical junction within normal limits. Bone marrow signal intensity normal. No scalp soft tissue abnormality. Sinuses/Orbits: Globes and orbital soft tissues within normal limits. Mild scattered mucosal thickening noted within the ethmoidal air cells and maxillary sinuses. Paranasal sinuses are otherwise clear. Trace bilateral mastoid effusions. Visualized nasopharynx unremarkable. Inner ear structures grossly normal. Other: None. IMPRESSION: 1. No acute intracranial abnormality. 2. Mildly advanced cerebral atrophy for age. Electronically Signed   By: Jeannine Boga M.D.   On: 12/15/2021 20:34   DG Shoulder Left  Result Date: 12/16/2021 CLINICAL DATA:  fall EXAM: LEFT SHOULDER - 2+ VIEW COMPARISON:  Chest x-ray 09/27/2021. FINDINGS: No evidence of fracture,  dislocation, or joint effusion. Acromioclavicular degenerative changes. No aggressive appearing focal bone abnormality. Soft tissues are unremarkable. IMPRESSION: No acute displaced fracture or dislocation. Electronically Signed   By: Iven Finn M.D.   On: 12/16/2021 19:00    Procedures Procedures    Medications Ordered in ED Medications  lactated ringers bolus 1,000 mL (0 mLs Intravenous Stopped 12/16/21 2034)  LORazepam (ATIVAN) injection 1 mg (1 mg Intravenous Given 12/16/21 1929)  chlordiazePOXIDE (LIBRIUM) capsule 50 mg (50 mg Oral Given 12/16/21 2204)    ED Course/ Medical Decision Making/ A&P                           Medical Decision Making Problems Addressed: Alcohol withdrawal syndrome without complication [facility]): acute illness or injury  Amount and/or Complexity of Data Reviewed Independent Historian: parent External Data Reviewed: notes. Labs: ordered. Radiology: ordered and independent interpretation performed. ECG/medicine tests: ordered and independent interpretation performed.  Risk OTC drugs. Prescription drug management.   Patient presents with 2 falls today.  This seems to be related to alcohol withdrawal.  Of note, I reviewed ED visit from December 2022 at an outside hospital through care everywhere, and at that time he was at [facility], did not receive Ativan during alcohol detox and fell.  I think his withdrawal is contributing to his difficulty with balance.  After being given IV Ativan he is feeling significantly better and relief of his symptoms.  He was able to ambulate here.  He had a slightly low glucose that improved and he was able to eat.  He wants inpatient detox but that is not offered at this facility.  We discussed how being admitted to the hospital would not change much besides being treated for alcohol withdrawal, which given his low CIWA we could do as an outpatient.  Given he can walk now it is important that he take the Librium that was prescribed  to him yesterday and he may return if any symptoms worsen.  I have a low suspicion for other emergent causes of dizziness such as stroke, significant volume depletion, significant electrolyte disturbance.  His labs have been reviewed and interpreted and show mildly low glucose but otherwise no significant electrolyte abnormality.  Head CT was personally reviewed and interpreted as well as his shoulder x-ray and both of these showed no acute process on my view.  ECG interpreted and shows stable nonspecific T waves.        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

## 2021-12-16 NOTE — ED Triage Notes (Signed)
Pt BIB [facility] from home d/t fall. EMS stated pt fell twice. Hit his head on the floor, surface abrasion noted on L forehead. No blood thinner , NO LOC. Pt is on alcohol detox meds which he stated have been making him feeling dizzy which led to his fall. A&O x4. Vitals stable w EMS.

## 2021-12-17 DIAGNOSIS — U071 COVID-19: Secondary | ICD-10-CM | POA: Diagnosis not present

## 2021-12-17 DIAGNOSIS — F1010 Alcohol abuse, uncomplicated: Secondary | ICD-10-CM | POA: Diagnosis not present

## 2021-12-17 DIAGNOSIS — Z79899 Other long term (current) drug therapy: Secondary | ICD-10-CM | POA: Diagnosis not present

## 2021-12-17 DIAGNOSIS — R110 Nausea: Secondary | ICD-10-CM | POA: Diagnosis not present

## 2021-12-17 DIAGNOSIS — R251 Tremor, unspecified: Secondary | ICD-10-CM | POA: Diagnosis not present

## 2021-12-18 ENCOUNTER — Encounter: Payer: Self-pay | Admitting: Internal Medicine

## 2021-12-26 ENCOUNTER — Other Ambulatory Visit: Payer: Self-pay | Admitting: Internal Medicine

## 2021-12-28 ENCOUNTER — Other Ambulatory Visit: Payer: Self-pay | Admitting: Internal Medicine

## 2021-12-28 DIAGNOSIS — F411 Generalized anxiety disorder: Secondary | ICD-10-CM

## 2021-12-30 DIAGNOSIS — R001 Bradycardia, unspecified: Secondary | ICD-10-CM | POA: Diagnosis not present

## 2021-12-30 DIAGNOSIS — R531 Weakness: Secondary | ICD-10-CM | POA: Diagnosis not present

## 2021-12-30 DIAGNOSIS — I213 ST elevation (STEMI) myocardial infarction of unspecified site: Secondary | ICD-10-CM | POA: Diagnosis not present

## 2022-01-25 ENCOUNTER — Other Ambulatory Visit: Payer: Self-pay | Admitting: Internal Medicine

## 2022-01-25 ENCOUNTER — Telehealth: Payer: Self-pay | Admitting: Internal Medicine

## 2022-01-25 NOTE — Telephone Encounter (Signed)
PT Visits today and leaves forms to be filled out by Dr.Jones. Forms have been left inside the mail box!  CB: [phone]

## 2022-01-26 DIAGNOSIS — H2513 Age-related nuclear cataract, bilateral: Secondary | ICD-10-CM | POA: Diagnosis not present

## 2022-01-26 DIAGNOSIS — H524 Presbyopia: Secondary | ICD-10-CM | POA: Diagnosis not present

## 2022-01-26 DIAGNOSIS — H40023 Open angle with borderline findings, high risk, bilateral: Secondary | ICD-10-CM | POA: Diagnosis not present

## 2022-01-26 DIAGNOSIS — H43813 Vitreous degeneration, bilateral: Secondary | ICD-10-CM | POA: Diagnosis not present

## 2022-01-26 NOTE — Telephone Encounter (Signed)
PT scheduled for 2/6 @ 9.20 to discuss DMV forms

## 2022-01-27 DIAGNOSIS — Z85828 Personal history of other malignant neoplasm of skin: Secondary | ICD-10-CM | POA: Diagnosis not present

## 2022-01-27 DIAGNOSIS — D485 Neoplasm of uncertain behavior of skin: Secondary | ICD-10-CM | POA: Diagnosis not present

## 2022-01-27 DIAGNOSIS — L720 Epidermal cyst: Secondary | ICD-10-CM | POA: Diagnosis not present

## 2022-01-27 DIAGNOSIS — C44311 Basal cell carcinoma of skin of nose: Secondary | ICD-10-CM | POA: Diagnosis not present

## 2022-01-28 ENCOUNTER — Other Ambulatory Visit: Payer: Self-pay | Admitting: Internal Medicine

## 2022-01-28 ENCOUNTER — Other Ambulatory Visit: Payer: Self-pay | Admitting: Physician Assistant

## 2022-01-28 DIAGNOSIS — F332 Major depressive disorder, recurrent severe without psychotic features: Secondary | ICD-10-CM

## 2022-01-28 DIAGNOSIS — F411 Generalized anxiety disorder: Secondary | ICD-10-CM

## 2022-01-29 ENCOUNTER — Ambulatory Visit (INDEPENDENT_AMBULATORY_CARE_PROVIDER_SITE_OTHER): Payer: PPO

## 2022-01-29 ENCOUNTER — Ambulatory Visit: Payer: PPO | Admitting: Podiatry

## 2022-01-29 ENCOUNTER — Ambulatory Visit (INDEPENDENT_AMBULATORY_CARE_PROVIDER_SITE_OTHER): Payer: PPO | Admitting: Nurse Practitioner

## 2022-01-29 ENCOUNTER — Other Ambulatory Visit: Payer: Self-pay

## 2022-01-29 VITALS — BP 108/70 | HR 51 | Temp 98.1°F | Ht 71.0 in | Wt 220.2 lb

## 2022-01-29 DIAGNOSIS — D3610 Benign neoplasm of peripheral nerves and autonomic nervous system, unspecified: Secondary | ICD-10-CM | POA: Diagnosis not present

## 2022-01-29 DIAGNOSIS — D696 Thrombocytopenia, unspecified: Secondary | ICD-10-CM | POA: Diagnosis not present

## 2022-01-29 DIAGNOSIS — G629 Polyneuropathy, unspecified: Secondary | ICD-10-CM | POA: Diagnosis not present

## 2022-01-29 DIAGNOSIS — F1010 Alcohol abuse, uncomplicated: Secondary | ICD-10-CM | POA: Diagnosis not present

## 2022-01-29 DIAGNOSIS — G5761 Lesion of plantar nerve, right lower limb: Secondary | ICD-10-CM

## 2022-01-29 DIAGNOSIS — G5762 Lesion of plantar nerve, left lower limb: Secondary | ICD-10-CM

## 2022-01-29 DIAGNOSIS — I10 Essential (primary) hypertension: Secondary | ICD-10-CM

## 2022-01-29 DIAGNOSIS — M79672 Pain in left foot: Secondary | ICD-10-CM

## 2022-01-29 DIAGNOSIS — R739 Hyperglycemia, unspecified: Secondary | ICD-10-CM

## 2022-01-29 DIAGNOSIS — M79671 Pain in right foot: Secondary | ICD-10-CM

## 2022-01-29 DIAGNOSIS — D472 Monoclonal gammopathy: Secondary | ICD-10-CM

## 2022-01-29 DIAGNOSIS — R52 Pain, unspecified: Secondary | ICD-10-CM | POA: Diagnosis not present

## 2022-01-29 DIAGNOSIS — G63 Polyneuropathy in diseases classified elsewhere: Secondary | ICD-10-CM

## 2022-01-29 DIAGNOSIS — D3613 Benign neoplasm of peripheral nerves and autonomic nervous system of lower limb, including hip: Secondary | ICD-10-CM | POA: Diagnosis not present

## 2022-01-29 LAB — COMPREHENSIVE METABOLIC PANEL
ALT: 16 U/L (ref 0–53)
AST: 19 U/L (ref 0–37)
Albumin: 4 g/dL (ref 3.5–5.2)
Alkaline Phosphatase: 56 U/L (ref 39–117)
BUN: 16 mg/dL (ref 6–23)
CO2: 34 mEq/L — ABNORMAL HIGH (ref 19–32)
Calcium: 9.7 mg/dL (ref 8.4–10.5)
Chloride: 99 mEq/L (ref 96–112)
Creatinine, Ser: 0.81 mg/dL (ref 0.40–1.50)
GFR: 90.2 mL/min (ref >60.00)
Glucose, Bld: 93 mg/dL (ref 70–99)
Potassium: 3.7 mEq/L (ref 3.5–5.1)
Sodium: 138 mEq/L (ref 135–145)
Total Bilirubin: 0.5 mg/dL (ref 0.2–1.2)
Total Protein: 7.3 g/dL (ref 6.0–8.3)

## 2022-01-29 LAB — CBC WITH DIFFERENTIAL/PLATELET
Basophils Absolute: 0 10*3/uL (ref 0.0–0.1)
Basophils Relative: 0.4 % (ref 0.0–3.0)
Eosinophils Absolute: 0.2 10*3/uL (ref 0.0–0.7)
Eosinophils Relative: 2.5 % (ref 0.0–5.0)
HCT: 37.9 % — ABNORMAL LOW (ref 39.0–52.0)
Hemoglobin: 13.2 g/dL (ref 13.0–17.0)
Lymphocytes Relative: 22 % (ref 12.0–46.0)
Lymphs Abs: 1.6 10*3/uL (ref 0.7–4.0)
MCHC: 34.7 g/dL (ref 30.0–36.0)
MCV: 101.2 fl — ABNORMAL HIGH (ref 78.0–100.0)
Monocytes Absolute: 0.7 10*3/uL (ref 0.1–1.0)
Monocytes Relative: 9.6 % (ref 3.0–12.0)
Neutro Abs: 4.7 10*3/uL (ref 1.4–7.7)
Neutrophils Relative %: 65.5 % (ref 43.0–77.0)
Platelets: 172 10*3/uL (ref 150.0–400.0)
RBC: 3.74 Mil/uL — ABNORMAL LOW (ref 4.22–5.81)
RDW: 12.6 % (ref 11.5–15.5)
WBC: 7.1 10*3/uL (ref 4.0–10.5)

## 2022-01-29 LAB — HEMOGLOBIN A1C: Hgb A1c MFr Bld: 5.4 % (ref 4.6–6.5)

## 2022-01-29 MED ORDER — CARVEDILOL 6.25 MG PO TABS: 6.2500 mg | ORAL_TABLET | Freq: Two times a day (BID) | ORAL | 2 refills | Status: DC

## 2022-01-29 MED ORDER — TRIAMCINOLONE ACETONIDE 10 MG/ML IJ SUSP
10.0000 mg | Freq: Once | INTRAMUSCULAR | Status: AC
  Administered 2022-01-29: 10 mg

## 2022-01-29 NOTE — Assessment & Plan Note (Signed)
Mild thrombocytopenia noted on last CBC.  We will check repeat CBC today for further evaluation.  Further recommendations to be made based upon this result. ?

## 2022-01-29 NOTE — Assessment & Plan Note (Signed)
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  He is congratulated on staying sober and encouraged to continue abstaining from alcohol.  We will check CMP to monitor elevated liver enzymes.  Further recommendations may be made based upon these results. ?

## 2022-01-29 NOTE — Patient Instructions (Signed)

## 2022-01-29 NOTE — Assessment & Plan Note (Signed)
Blood pressure well controlled with some mild bradycardia.  Will reduce carvedilol from 12.5 mg twice a day to 6.25 mg twice a day.  He will have close follow-up to monitor response to treatment regimen change in approximately 3 days with primary care provider. ?

## 2022-01-29 NOTE — Progress Notes (Signed)
? ? ? ?Subjective:  ?Patient ID: Lee Lee, male    DOB: June 27, 1951  Age: 71 y.o. MRN: [id number] ? ?CC:  ?Chief Complaint  ?Patient presents with  ? Follow-up  ?  Alcohol abuse  ?  ? ? ?HPI  ?This patient arrives today for the above. ? ?He recently discharged from [facility] for treatment of alcohol abuse.  He tells me his sober date is 12/07/2021.  He tells me he enjoyed his time and feels motivated to continue abstaining from alcohol.  He reports that he has a history of elevated liver enzymes, but while his blood work was being monitored during his stay at [facility] his liver enzymes are normal.  He is now in intensive outpatient treatment, and reports motivation to continue with the program.  He tells me he does have a history of elevated blood pressure, but since his sober day his blood pressure has been a bit lower.  He denies any dizziness, chest pain, or shortness of breath.  He is on carvedilol and omeprazole.  He takes his medications as prescribed and has a history of atrial flutter, but tells me he has undergone 2 ablations 1 in 2014 and 12/18/2014 and since his second ablation has not had any issues with his atrial flutter.  Per chart review I do see that last CBC showed thrombocytopenia with platelet level of 145, this was last collected in January 2023. ? ?Past Medical History:  ?Diagnosis Date  ? Alcohol abuse, episodic drinking behavior   ? Anxiety   ? Arthritis   ? back & knees  ? Atrial flutter ([place])   ? s/p RFCA 01/05/13  ? Calcium oxalate renal stones   ? Complication of anesthesia   ? makes him loopy  ? Depression   ? ED (erectile dysfunction)   ? Hemorrhoids   ? Hypertension   ? ? ? ? ?Family History  ?Problem Relation Age of Onset  ? Valvular heart disease Mother   ? Lymphoma Mother   ? ? ?Social History  ? ?Social History Narrative  ? Not on file  ? ?Social History  ? ?Tobacco Use  ? Smoking status: Former  ?  Types: Cigars  ?  Quit date: 2002  ?  Years since quitting: 21.1  ?  Smokeless tobacco: Current  ?  Types: Chew  ? Tobacco comments:  ?  chews tobacco when playing golf only  ?Substance Use Topics  ? Alcohol use: Yes  ?  Alcohol/week: 20.0 standard drinks  ?  Types: 20 Glasses of wine per week  ? ? ? ?Current Meds  ?Medication Sig  ? b complex vitamins capsule Take 1 capsule by mouth daily.  ? busPIRone (BUSPAR) 5 MG tablet TAKE ONE TABLET BY MOUTH THREE TIMES A DAY  ? desvenlafaxine (PRISTIQ) 50 MG 24 hr tablet TAKE ONE TABLET BY MOUTH DAILY  ? indapamide (LOZOL) 2.5 MG tablet TAKE ONE TABLET BY MOUTH DAILY  ? irbesartan (AVAPRO) 300 MG tablet TAKE ONE TABLET BY MOUTH DAILY  ? midodrine (PROAMATINE) 10 MG tablet Take 1 tablet (10 mg total) by mouth 3 (three) times daily as needed (While awake and on your feet to prevent dizziness).  ? naltrexone (DEPADE) 50 MG tablet Take 50 mg by mouth daily.  ? rOPINIRole (REQUIP) 0.5 MG tablet Take 0.5 mg by mouth at bedtime.  ? thiamine 100 MG tablet Take 1 tablet (100 mg total) by mouth daily.  ? traZODone (DESYREL) 150 MG tablet Take 1  tablet (150 mg total) by mouth at bedtime as needed for sleep.  ? [DISCONTINUED] carvedilol (COREG) 12.5 MG tablet TAKE ONE TABLET BY MOUTH TWICE A DAY WITH A MEAL (Patient taking differently: Take 12.5 mg by mouth daily. TAKE ONE TABLET BY MOUTH ONCE A DAY WITH A MEAL)  ? [DISCONTINUED] chlordiazePOXIDE (LIBRIUM) 25 MG capsule 49m PO TID x 1D, then 25-519mPO BID X 1D, then 25-5026mO QD X 1D  ? [DISCONTINUED] LORazepam (ATIVAN) 2 MG tablet Take 2 mg by mouth daily as needed for seizure.  ? ? ?ROS:  ?Review of Systems  ?Respiratory:  Negative for shortness of breath.   ?Cardiovascular:  Negative for chest pain.  ?Gastrointestinal:  Negative for abdominal pain and blood in stool.  ?Neurological:  Negative for dizziness and headaches.  ? ? ?Objective:  ? ?Today's Vitals: BP 108/70   Pulse (!) 51   Temp 98.1 ?F (36.7 ?C) (Oral)   Ht _0  (1.803 m)   Wt 220 lb 4 oz (99.9 kg)   SpO2 99%   BMI 30.72 kg/m?   ?Vitals with BMI 01/29/2022 12/16/2021 12/16/2021  ?Height _1  - -  ?Weight 220 lbs 4 oz - -  ?BMI 30.73 - -  ?Systolic [id number] ?Diastolic 70 88 -  ?Pulse 51 74 73  ?Some encounter information is confidential and restricted. Go to Review Flowsheets activity to see all data.  ?  ? ?Physical Exam ?Vitals reviewed.  ?Constitutional:   ?   Appearance: Normal appearance.  ?HENT:  ?   Head: Normocephalic and atraumatic.  ?Cardiovascular:  ?   Rate and Rhythm: Normal rate and regular rhythm.  ?Pulmonary:  ?   Effort: Pulmonary effort is normal.  ?   Breath sounds: Normal breath sounds.  ?Musculoskeletal:  ?   Cervical back: Neck supple.  ?Skin: ?   General: Skin is warm and dry.  ?Neurological:  ?   Mental Status: He is alert and oriented to person, place, and time.  ?Psychiatric:     ?   Mood and Affect: Mood normal.     ?   Behavior: Behavior normal.     ?   Thought Content: Thought content normal.     ?   Judgment: Judgment normal.  ? ? ? ? ? ? ? ?Assessment and Plan  ? ?1. Alcohol abuse   ?2. Essential hypertension   ?3. Hyperglycemia   ?4. Thrombocytopenia ([place] ? ? ? ?Plan: ?See plan via problem list below. ? ? ? ?Tests ordered ?Orders Placed This Encounter  ?Procedures  ? Comp Met (CMET)  ? CBC with Differential/Platelet  ? HgB A1c  ? ? ? ? ?Meds ordered this encounter  ?Medications  ? carvedilol (COREG) 6.25 MG tablet  ?  Sig: Take 1 tablet (6.25 mg total) by mouth 2 (two) times daily with a meal.  ?  Dispense:  60 tablet  ?  Refill:  2  ?  Order Specific Question:   Supervising Provider  ?  Answer:   BURBinnie Rail0[[id number]] ? ?Patient to follow-up next week as scheduled. ? ?SARAilene ArdsP ? ?

## 2022-01-29 NOTE — Assessment & Plan Note (Signed)
Per chart review I do see he had an elevated glucose level in December 2022.  We will check A1c today for further evaluation. ?

## 2022-02-01 ENCOUNTER — Ambulatory Visit (INDEPENDENT_AMBULATORY_CARE_PROVIDER_SITE_OTHER): Payer: PPO | Admitting: Internal Medicine

## 2022-02-01 ENCOUNTER — Encounter: Payer: Self-pay | Admitting: Internal Medicine

## 2022-02-01 ENCOUNTER — Other Ambulatory Visit: Payer: Self-pay

## 2022-02-01 VITALS — BP 144/82 | HR 64 | Temp 98.0°F | Resp 16 | Ht 71.0 in | Wt 219.0 lb

## 2022-02-01 DIAGNOSIS — K7581 Nonalcoholic steatohepatitis (NASH): Secondary | ICD-10-CM | POA: Diagnosis not present

## 2022-02-01 DIAGNOSIS — D539 Nutritional anemia, unspecified: Secondary | ICD-10-CM

## 2022-02-01 DIAGNOSIS — D696 Thrombocytopenia, unspecified: Secondary | ICD-10-CM | POA: Diagnosis not present

## 2022-02-01 DIAGNOSIS — I10 Essential (primary) hypertension: Secondary | ICD-10-CM

## 2022-02-01 NOTE — Progress Notes (Signed)
? ?Subjective:  ?Patient ID: Lee Lee, male    DOB: 10/28/1951  Age: 71 y.o. MRN: [id number] ? ?CC: Anemia and Hypertension ? ?This visit occurred during the SARS-CoV-2 public health emergency.  Safety protocols were in place, including screening questions prior to the visit, additional usage of staff PPE, and extensive cleaning of exam room while observing appropriate contact time as indicated for disinfecting solutions.   ? ?HPI ?Lee Lee presents for f/up -  ? ?He recently completed rehab at [facility] and is clean and sober.  He needs a form completed for the DMV.  He feels well today and offers no complaints. ? ?Outpatient Medications Prior to Visit  ?Medication Sig Dispense Refill  ? b complex vitamins capsule Take 1 capsule by mouth daily.    ? busPIRone (BUSPAR) 5 MG tablet TAKE ONE TABLET BY MOUTH THREE TIMES A DAY 90 tablet 0  ? carvedilol (COREG) 6.25 MG tablet Take 1 tablet (6.25 mg total) by mouth 2 (two) times daily with a meal. 60 tablet 2  ? desvenlafaxine (PRISTIQ) 50 MG 24 hr tablet TAKE ONE TABLET BY MOUTH DAILY 90 tablet 0  ? indapamide (LOZOL) 2.5 MG tablet TAKE ONE TABLET BY MOUTH DAILY 90 tablet 0  ? irbesartan (AVAPRO) 300 MG tablet TAKE ONE TABLET BY MOUTH DAILY 90 tablet 1  ? meclizine (ANTIVERT) 25 MG tablet Take 1 tablet (25 mg total) by mouth 3 (three) times daily as needed for dizziness. 30 tablet 0  ? midodrine (PROAMATINE) 10 MG tablet Take 1 tablet (10 mg total) by mouth 3 (three) times daily as needed (While awake and on your feet to prevent dizziness). 240 tablet 3  ? naltrexone (DEPADE) 50 MG tablet Take 50 mg by mouth daily.    ? rOPINIRole (REQUIP) 0.5 MG tablet Take 0.5 mg by mouth at bedtime.    ? rosuvastatin (CRESTOR) 5 MG tablet Take 1 tablet (5 mg total) by mouth daily. 90 tablet 3  ? thiamine 100 MG tablet Take 1 tablet (100 mg total) by mouth daily. 14 tablet 0  ? traZODone (DESYREL) 150 MG tablet Take 1 tablet (150 mg total) by mouth at bedtime as  needed for sleep. 90 tablet 1  ? ?No facility-administered medications prior to visit.  ? ? ?ROS ?Review of Systems  ?Constitutional:  Negative for diaphoresis and fatigue.  ?HENT: Negative.    ?Eyes: Negative.   ?Respiratory:  Negative for cough, chest tightness, shortness of breath and wheezing.   ?Cardiovascular:  Negative for chest pain, palpitations and leg swelling.  ?Gastrointestinal:  Negative for abdominal pain, constipation, diarrhea and nausea.  ?Genitourinary: Negative.  Negative for difficulty urinating.  ?Musculoskeletal: Negative.  Negative for arthralgias and myalgias.  ?Skin: Negative.   ?Neurological: Negative.  Negative for dizziness and weakness.  ?Hematological:  Negative for adenopathy. Does not bruise/bleed easily.  ?Psychiatric/Behavioral: Negative.  Negative for dysphoric mood and suicidal ideas. The patient is not nervous/anxious.   ? ?Objective:  ?BP (!) 144/82 (BP Location: Left Arm, Patient Position: Sitting, Cuff Size: Large)   Pulse 64   Temp 98 ?F (36.7 ?C) (Oral)   Resp 16   Ht 5' 11"  (1.803 m)   Wt 219 lb (99.3 kg)   SpO2 96%   BMI 30.54 kg/m?  ? ?BP Readings from Last 3 Encounters:  ?02/01/22 (!) 144/82  ?01/29/22 108/70  ?12/16/21 (!) 142/88  ? ? ?Wt Readings from Last 3 Encounters:  ?02/01/22 219 lb (99.3 kg)  ?01/29/22  220 lb 4 oz (99.9 kg)  ?11/03/21 222 lb (100.7 kg)  ? ? ?Physical Exam ?Vitals reviewed.  ?HENT:  ?   Nose: Nose normal.  ?   Mouth/Throat:  ?   Mouth: Mucous membranes are moist.  ?Eyes:  ?   General: No scleral icterus. ?   Conjunctiva/sclera: Conjunctivae normal.  ?Cardiovascular:  ?   Rate and Rhythm: Normal rate and regular rhythm.  ?   Heart sounds: No murmur heard. ?Pulmonary:  ?   Effort: Pulmonary effort is normal.  ?   Breath sounds: No stridor. No wheezing, rhonchi or rales.  ?Abdominal:  ?   General: Abdomen is flat.  ?   Palpations: There is no mass.  ?   Tenderness: There is no abdominal tenderness. There is no guarding.  ?   Hernia: No hernia  is present.  ?Musculoskeletal:     ?   General: Normal range of motion.  ?   Cervical back: Neck supple.  ?   Right lower leg: No edema.  ?   Left lower leg: No edema.  ?Lymphadenopathy:  ?   Cervical: No cervical adenopathy.  ?Skin: ?   General: Skin is warm and dry.  ?Neurological:  ?   General: No focal deficit present.  ?   Mental Status: He is alert.  ?Psychiatric:     ?   Mood and Affect: Mood normal.     ?   Behavior: Behavior normal.     ?   Thought Content: Thought content normal.     ?   Judgment: Judgment normal.  ? ? ?Lab Results  ?Component Value Date  ? WBC 7.1 01/29/2022  ? HGB 13.2 01/29/2022  ? HCT 37.9 (L) 01/29/2022  ? PLT 172.0 01/29/2022  ? GLUCOSE 93 01/29/2022  ? CHOL 150 08/17/2021  ? TRIG 71 08/17/2021  ? HDL 73 08/17/2021  ? LDLDIRECT 101.0 12/26/2018  ? Levan 63 08/17/2021  ? ALT 16 01/29/2022  ? AST 19 01/29/2022  ? NA 138 01/29/2022  ? K 3.7 01/29/2022  ? CL 99 01/29/2022  ? CREATININE 0.81 01/29/2022  ? BUN 16 01/29/2022  ? CO2 34 (H) 01/29/2022  ? TSH 1.21 05/19/2021  ? PSA 4.07 (H) 11/03/2021  ? INR 1.1 (H) 05/19/2021  ? HGBA1C 5.4 01/29/2022  ? ? ?CT Head Wo Contrast ? ?Result Date: 12/16/2021 ?CLINICAL DATA:  [place] [address], hit head on floor, abrasion EXAM: CT HEAD WITHOUT CONTRAST TECHNIQUE: Contiguous axial images were obtained from the base of the skull through the vertex without intravenous contrast. RADIATION DOSE REDUCTION: This exam was performed according to the departmental dose-optimization program which includes automated exposure control, adjustment of the mA and/or kV according to patient size and/or use of iterative reconstruction technique. COMPARISON:  12/15/2021 FINDINGS: Brain: No acute infarct or hemorrhage. Lateral ventricles and midline structures are unremarkable. No acute extra-axial fluid collections. No mass effect. Vascular: No hyperdense vessel or unexpected calcification. Skull: Normal. Negative for fracture or focal lesion. Sinuses/Orbits: No acute finding.  Other: None. IMPRESSION: 1. Stable head CT, no acute intracranial process. Electronically Signed   By: Randa Ngo M.D.   On: 12/16/2021 19:11  ? ?DG Shoulder Left ? ?Result Date: 12/16/2021 ?CLINICAL DATA:  fall EXAM: LEFT SHOULDER - 2+ VIEW COMPARISON:  Chest x-ray 09/27/2021. FINDINGS: No evidence of fracture, dislocation, or joint effusion. Acromioclavicular degenerative changes. No aggressive appearing focal bone abnormality. Soft tissues are unremarkable. IMPRESSION: No acute displaced fracture or dislocation. Electronically Signed  By: Iven Finn M.D.   On: 12/16/2021 19:00  ? ? ?Assessment & Plan:  ? ?Lee Lee was seen today for anemia and hypertension. ? ?Diagnoses and all orders for this visit: ? 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 ? ?Thrombocytopenia ([place])- PLTs are normal now that he has abstained from alcohol. ? ?Essential hypertension- His BP is well controlled. ? ?Steatohepatitis- LFT's are normal now. ? ? ?I am having Ester Mabe. Trott maintain his b complex vitamins, midodrine, rosuvastatin, naltrexone, meclizine, thiamine, desvenlafaxine, busPIRone, indapamide, traZODone, irbesartan, rOPINIRole, and carvedilol. ? ?No orders of the defined types were placed in this encounter. ? ? ? ?Follow-up: Return in about 6 months (around 08/04/2022). ? ?Scarlette Calico, MD ?

## 2022-02-01 NOTE — Patient Instructions (Signed)

## 2022-02-07 NOTE — Progress Notes (Signed)
Subjective: ?71 year old male presents the office today with concerns of bilateral foot discomfort, new complaint of pain.  Is noticing numbness in the ball of his foot into the lesser digits.  This started about 2 months ago.  No injuries that he reports.  Right side worse than left.  No significant pain.  He does endorse tingling numbness mostly into the digits of the third and fourth toe but also some of the second digit.  No swelling.  No injuries.  No other concerns. ? ?Objective: ?AAO x3, NAD ?DP/PT pulses palpable bilaterally, CRT less than 3 seconds ?Sensation decreased with Semmes Weinstein monofilament ?Palpation of the third interspaces bilaterally he does get the reoccurrence of the symptoms into the digits.  There is no area of pinpoint tenderness.  No edema, erythema.  No area of pinpoint tenderness.  MMT 5/5. ?No pain with calf compression, swelling, warmth, erythema ? 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 ?Plan: ?-All treatment options discussed with the patient including all alternatives, risks, complications.  ?-He does have decreased sensation but is symptoms are all more localized as well.  We will treat this as a neuroma.  We discussed steroid injection he wishes to proceed with this.  Skin was prepped with alcohol mixture 1 cc Kenalog 10, 0.5 cc of Marcaine plain, 0.5 cc of lidocaine plain was infiltrated into the third interspaces bilaterally without complications.  Postinjection care discussed.  Tolerated well. ?-Metatarsal, neuroma pads.  Discussed shoe modifications. ?-If symptoms persist consider evaluation for underlying neuropathy. ?-Patient encouraged to call the office with any questions, concerns, change in symptoms.  ? ?Trula Slade DPM ? ?

## 2022-02-19 ENCOUNTER — Other Ambulatory Visit: Payer: Self-pay

## 2022-02-19 ENCOUNTER — Ambulatory Visit: Payer: PPO | Admitting: Podiatry

## 2022-02-19 DIAGNOSIS — G629 Polyneuropathy, unspecified: Secondary | ICD-10-CM

## 2022-02-19 DIAGNOSIS — D3613 Benign neoplasm of peripheral nerves and autonomic nervous system of lower limb, including hip: Secondary | ICD-10-CM

## 2022-02-19 DIAGNOSIS — D3610 Benign neoplasm of peripheral nerves and autonomic nervous system, unspecified: Secondary | ICD-10-CM

## 2022-02-19 MED ORDER — TRIAMCINOLONE ACETONIDE 10 MG/ML IJ SUSP
10.0000 mg | Freq: Once | INTRAMUSCULAR | Status: AC
  Administered 2022-02-19: 10 mg

## 2022-02-19 MED ORDER — GABAPENTIN 300 MG PO CAPS: 300.0000 mg | ORAL_CAPSULE | Freq: Three times a day (TID) | ORAL | 0 refills | Status: DC

## 2022-02-19 NOTE — Patient Instructions (Signed)
Gabapentin Capsules or Tablets ?What is this medication? ?GABAPENTIN (GA ba pen tin) treats nerve pain. It may also be used to prevent and control seizures in people with epilepsy. It works by calming overactive nerves in your body. ?This medicine may be used for other purposes; ask your health care provider or pharmacist if you have questions. ?COMMON BRAND NAME(S): Active-PAC with Gabapentin, Gabarone, Gralise, Neurontin ?What should I tell my care team before I take this medication? ?They need to know if you have any of these conditions: ?Alcohol or substance use disorder ?Kidney disease ?Lung or breathing disease ?Suicidal thoughts, plans, or attempt; a previous suicide attempt by you or a family member ?An unusual or allergic reaction to gabapentin, other medications, foods, dyes, or preservatives ?Pregnant or trying to get pregnant ?Breast-feeding ?How should I use this medication? ?Take this medication by mouth with a glass of water. Follow the directions on the prescription label. You can take it with or without food. If it upsets your stomach, take it with food. Take your medication at regular intervals. Do not take it more often than directed. Do not stop taking except on your care team's advice. ?If you are directed to break the 600 or 800 mg tablets in half as part of your dose, the extra half tablet should be used for the next dose. If you have not used the extra half tablet within 28 days, it should be thrown away. ?A special MedGuide will be given to you by the pharmacist with each prescription and refill. Be sure to read this information carefully each time. ?Talk to your care team about the use of this medication in children. While this medication may be prescribed for children as young as 3 years for selected conditions, precautions do apply. ?Overdosage: If you think you have taken too much of this medicine contact a poison control center or emergency room at once. ?NOTE: This medicine is only for  you. Do not share this medicine with others. ?What if I miss a dose? ?If you miss a dose, take it as soon as you can. If it is almost time for your next dose, take only that dose. Do not take double or extra doses. ?What may interact with this medication? ?Alcohol ?Antihistamines for allergy, cough, and cold ?Certain medications for anxiety or sleep ?Certain medications for depression like amitriptyline, fluoxetine, sertraline ?Certain medications for seizures like phenobarbital, primidone ?Certain medications for stomach problems ?General anesthetics like halothane, isoflurane, methoxyflurane, propofol ?Local anesthetics like lidocaine, pramoxine, tetracaine ?Medications that relax muscles for surgery ?Opioid medications for pain ?Phenothiazines like chlorpromazine, mesoridazine, prochlorperazine, thioridazine ?This list may not describe all possible interactions. Give your health care provider a list of all the medicines, herbs, non-prescription drugs, or dietary supplements you use. Also tell them if you smoke, drink alcohol, or use illegal drugs. Some items may interact with your medicine. ?What should I watch for while using this medication? ?Visit your care team for regular checks on your progress. You may want to keep a record at home of how you feel your condition is responding to treatment. You may want to share this information with your care team at each visit. You should contact your care team if your seizures get worse or if you have any new types of seizures. Do not stop taking this medication or any of your seizure medications unless instructed by your care team. Stopping your medication suddenly can increase your seizures or their severity. ?This medication may cause serious skin   reactions. They can happen weeks to months after starting the medication. Contact your care team right away if you notice fevers or flu-like symptoms with a rash. The rash may be red or purple and then turn into blisters or  peeling of the skin. Or, you might notice a red rash with swelling of the face, lips or lymph nodes in your neck or under your arms. ?Wear a medical identification bracelet or chain if you are taking this medication for seizures. Carry a card that lists all your medications. ?This medication may affect your coordination, reaction time, or judgment. Do not drive or operate machinery until you know how this medication affects you. Sit up or stand slowly to reduce the risk of dizzy or fainting spells. Drinking alcohol with this medication can increase the risk of these side effects. ?Your mouth may get dry. Chewing sugarless gum or sucking hard candy, and drinking plenty of water may help. ?Watch for new or worsening thoughts of suicide or depression. This includes sudden changes in mood, behaviors, or thoughts. These changes can happen at any time but are more common in the beginning of treatment or after a change in dose. Call your care team right away if you experience these thoughts or worsening depression. ?If you become pregnant while using this medication, you may enroll in the [place] Antiepileptic Drug Pregnancy Registry by calling 1-888-233-2334. This registry collects information about the safety of antiepileptic medication use during pregnancy. ?What side effects may I notice from receiving this medication? ?Side effects that you should report to your care team as soon as possible: ?Allergic reactions or angioedema--skin rash, itching, hives, swelling of the face, eyes, lips, tongue, arms, or legs, trouble swallowing or breathing ?Rash, fever, and swollen lymph nodes ?Thoughts of suicide or self harm, worsening mood, feelings of depression ?Trouble breathing ?Unusual changes in mood or behavior in children after use such as difficulty concentrating, hostility, or restlessness ?Side effects that usually do not require medical attention (report to your care team if they continue or are  bothersome): ?Dizziness ?Drowsiness ?Nausea ?Swelling of ankles, feet, or hands ?Vomiting ?This list may not describe all possible side effects. Call your doctor for medical advice about side effects. You may report side effects to FDA at 1-800-FDA-1088. ?Where should I keep my medication? ?Keep out of reach of children and pets. ?Store at room temperature between 15 and 30 degrees C (59 and 86 degrees F). Get rid of any unused medication after the expiration date. ?This medication may cause accidental overdose and death if taken by other adults, children, or pets. ?To get rid of medications that are no longer needed or have expired: ?Take the medication to a medication take-back program. Check with your pharmacy or law enforcement to find a location. ?If you cannot return the medication, check the label or package insert to see if the medication should be thrown out in the garbage or flushed down the toilet. If you are not sure, ask your care team. If it is safe to put it in the trash, empty the medication out of the container. Mix the medication with cat litter, dirt, coffee grounds, or other unwanted substance. Seal the mixture in a bag or container. Put it in the trash. ?NOTE: This sheet is a summary. It may not cover all possible information. If you have questions about this medicine, talk to your doctor, pharmacist, or health care provider. ?? 2022 Elsevier/Gold Standard (2021-05-18 00:00:00) ? ?

## 2022-02-22 NOTE — Progress Notes (Signed)
Subjective: ?71 year old male presents the office today for follow-up evaluation of bilateral foot pain.  He states the injection did not help much he is interested in another injection.  After further discussion the patient regards to his symptoms I discussed the neuropathy and gabapentin.  He was previous on gabapentin for other issues and he does think the symptoms is worsened after stopping the medication.  No recent injuries or changes otherwise. ? ?Objective: ?AAO x3, NAD ?DP/PT pulses palpable bilaterally, CRT less than 3 seconds ?Tenderness to palpation of the third interspaces bilaterally and small palpable bursa versus neuroma is present today.  There is no area pinpoint tenderness.  No pain with imaging range of motion.  He does get the burning, nerve symptoms more diffusely however. ?No pain with calf compression, swelling, warmth, erythema ? 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 ?Plan: ?-All treatment options discussed with the patient including all alternatives, risks, complications.  ?-I think symptoms are overlapping.  Next a small palpable neuroma versus bursa third interspaces.  Injection was performed today bilaterally his request.  Skin was prepped with alcohol and 1 cc Kenalog 10, 1 cc 1 mix of Marcaine, lidocaine plain was infiltrated into the interspaces bilaterally.  Postinjection care discussed.  Tolerated well. ?-We will restart gabapentin.  Discussed side effects. ?-Patient encouraged to call the office with any questions, concerns, change in symptoms.  ? ?Trula Slade DPM ? ?

## 2022-02-26 ENCOUNTER — Other Ambulatory Visit: Payer: Self-pay | Admitting: Podiatry

## 2022-02-26 DIAGNOSIS — D3613 Benign neoplasm of peripheral nerves and autonomic nervous system of lower limb, including hip: Secondary | ICD-10-CM

## 2022-02-26 DIAGNOSIS — D472 Monoclonal gammopathy: Secondary | ICD-10-CM

## 2022-02-28 ENCOUNTER — Encounter: Payer: Self-pay | Admitting: Internal Medicine

## 2022-02-28 ENCOUNTER — Other Ambulatory Visit: Payer: Self-pay | Admitting: Internal Medicine

## 2022-02-28 DIAGNOSIS — F411 Generalized anxiety disorder: Secondary | ICD-10-CM

## 2022-03-01 ENCOUNTER — Other Ambulatory Visit: Payer: Self-pay | Admitting: Internal Medicine

## 2022-03-01 DIAGNOSIS — G2581 Restless legs syndrome: Secondary | ICD-10-CM

## 2022-03-01 DIAGNOSIS — F1011 Alcohol abuse, in remission: Secondary | ICD-10-CM

## 2022-03-01 MED ORDER — BUSPIRONE HCL 5 MG PO TABS: 5.0000 mg | ORAL_TABLET | Freq: Three times a day (TID) | ORAL | 0 refills | Status: DC

## 2022-03-01 MED ORDER — NALTREXONE HCL 50 MG PO TABS: 50.0000 mg | ORAL_TABLET | Freq: Every day | ORAL | 1 refills | Status: DC

## 2022-03-01 MED ORDER — ROPINIROLE HCL 0.5 MG PO TABS: 0.5000 mg | ORAL_TABLET | Freq: Every evening | ORAL | 1 refills | Status: DC

## 2022-03-02 DIAGNOSIS — C44311 Basal cell carcinoma of skin of nose: Secondary | ICD-10-CM | POA: Diagnosis not present

## 2022-03-02 DIAGNOSIS — Z85828 Personal history of other malignant neoplasm of skin: Secondary | ICD-10-CM | POA: Diagnosis not present

## 2022-03-15 ENCOUNTER — Other Ambulatory Visit: Payer: Self-pay | Admitting: Internal Medicine

## 2022-03-15 ENCOUNTER — Encounter: Payer: Self-pay | Admitting: Internal Medicine

## 2022-03-15 DIAGNOSIS — M545 Low back pain, unspecified: Secondary | ICD-10-CM | POA: Insufficient documentation

## 2022-03-24 ENCOUNTER — Encounter: Payer: Self-pay | Admitting: Internal Medicine

## 2022-03-25 DIAGNOSIS — M545 Low back pain, unspecified: Secondary | ICD-10-CM | POA: Diagnosis not present

## 2022-03-31 DIAGNOSIS — M21962 Unspecified acquired deformity of left lower leg: Secondary | ICD-10-CM | POA: Diagnosis not present

## 2022-03-31 DIAGNOSIS — M792 Neuralgia and neuritis, unspecified: Secondary | ICD-10-CM | POA: Diagnosis not present

## 2022-03-31 DIAGNOSIS — G629 Polyneuropathy, unspecified: Secondary | ICD-10-CM | POA: Diagnosis not present

## 2022-03-31 DIAGNOSIS — S99922A Unspecified injury of left foot, initial encounter: Secondary | ICD-10-CM | POA: Diagnosis not present

## 2022-04-04 ENCOUNTER — Other Ambulatory Visit: Payer: Self-pay | Admitting: Internal Medicine

## 2022-04-08 DIAGNOSIS — M545 Low back pain, unspecified: Secondary | ICD-10-CM | POA: Diagnosis not present

## 2022-04-10 ENCOUNTER — Encounter: Payer: Self-pay | Admitting: Internal Medicine

## 2022-04-10 ENCOUNTER — Encounter: Payer: Self-pay | Admitting: Podiatry

## 2022-04-10 IMAGING — CT CT ANGIO CHEST
3 of 8 series · 18 of 46 positions shown · IV contrast (OMNIPAQUE 350)
Comparison: CTA of the chest from 12/28/2019.

CLINICAL DATA: Ascending aortic aneurysm surveillance.

EXAM:
CT ANGIOGRAPHY CHEST WITH CONTRAST
TECHNIQUE: Multidetector CT imaging of the chest was performed using the
standard protocol during bolus administration of intravenous
contrast. Multiplanar CT image reconstructions and MIPs were
obtained to evaluate the vascular anatomy.
CONTRAST:  100mL OMNIPAQUE IOHEXOL 350 MG/ML SOLN

[Series 4: aorta 3.0 bf37 2 · axial · 0.83mm/px · z∈[-332,-80]mm · 13 of 100 slices shown]
[im 8/100  lung]
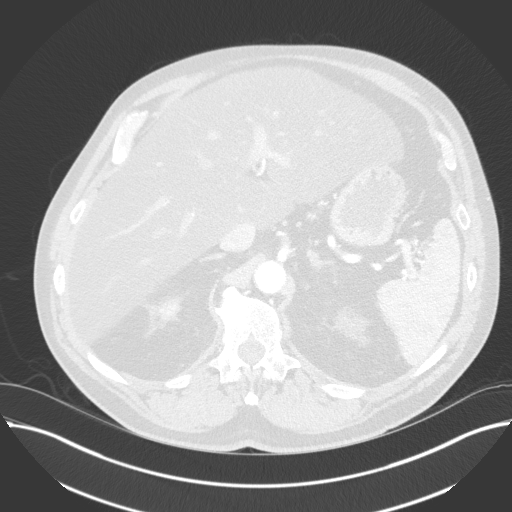
[im 15/100  soft-tissue]
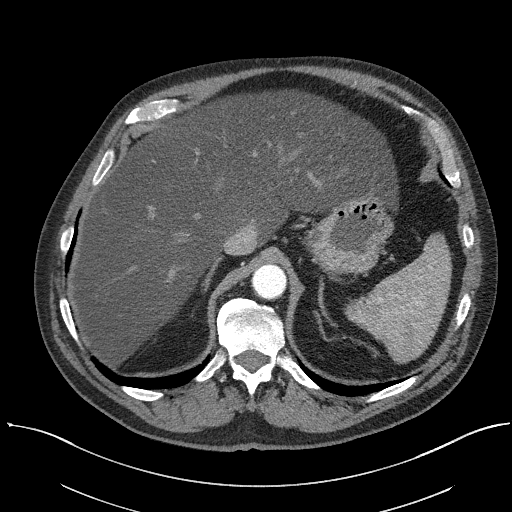
[im 22/100  lung]
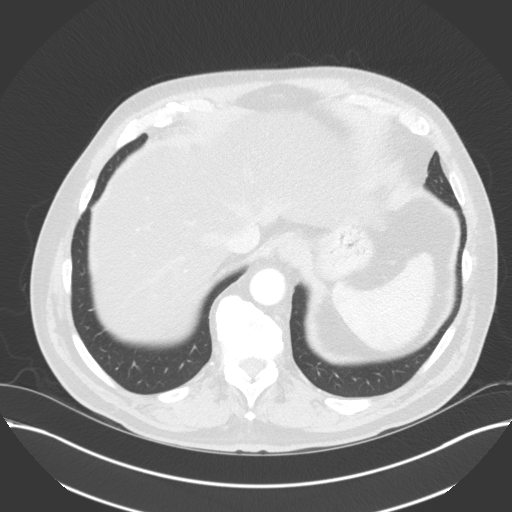
[im 29/100  soft-tissue]
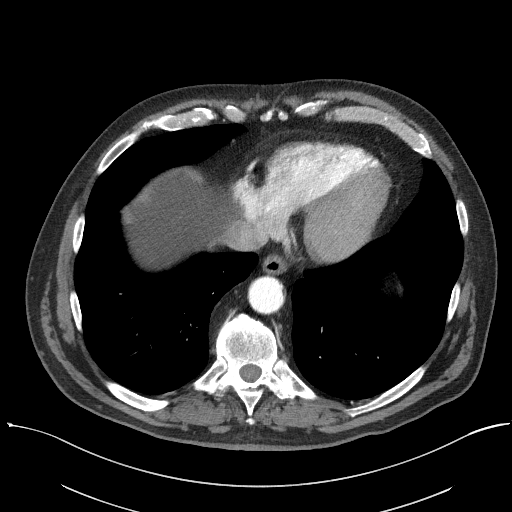
[im 36/100  lung]
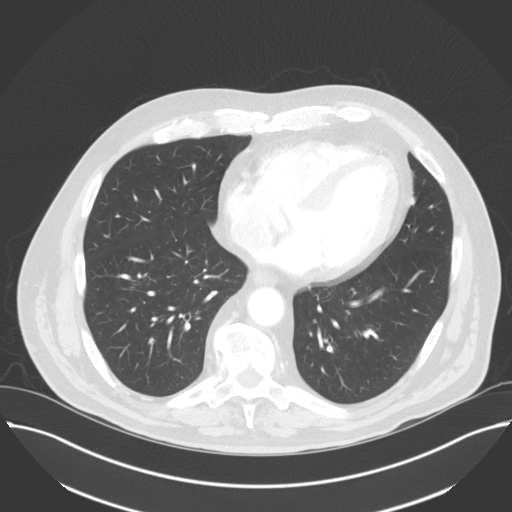
[im 43/100  soft-tissue]
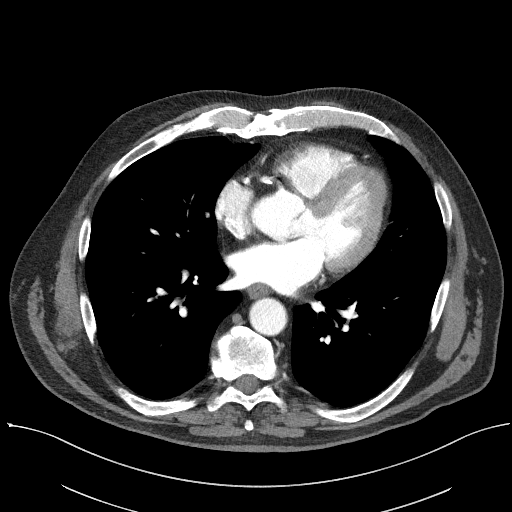
[im 50/100  lung]
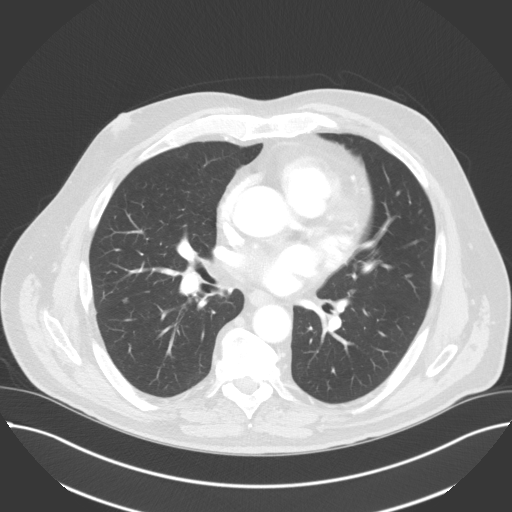
[im 57/100  soft-tissue]
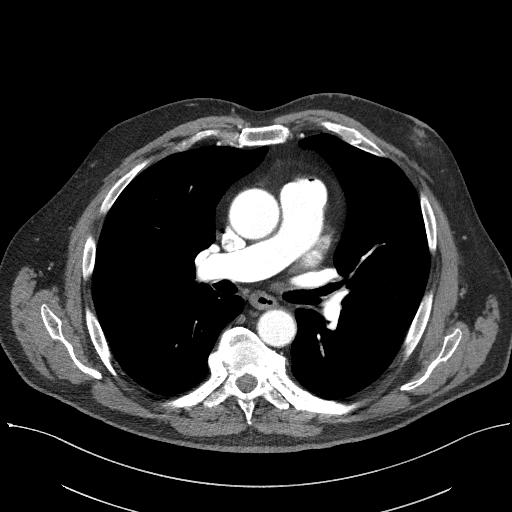
[im 64/100  lung]
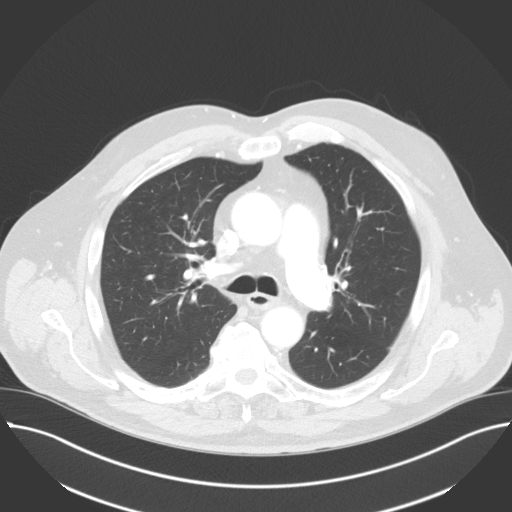
[im 71/100  soft-tissue]
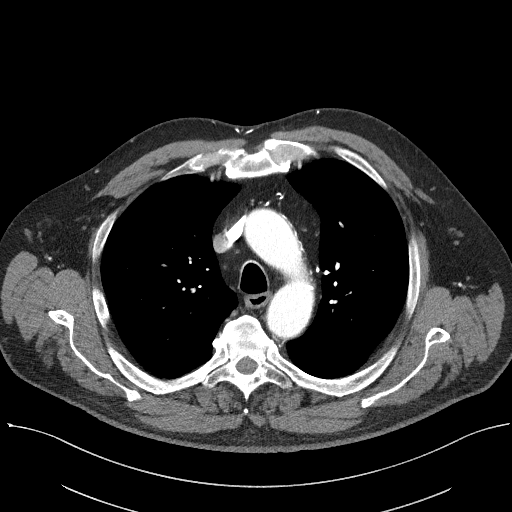
[im 78/100  lung]
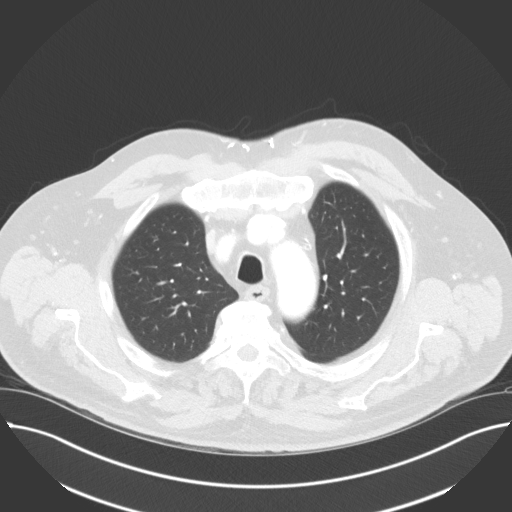
[im 85/100  soft-tissue]
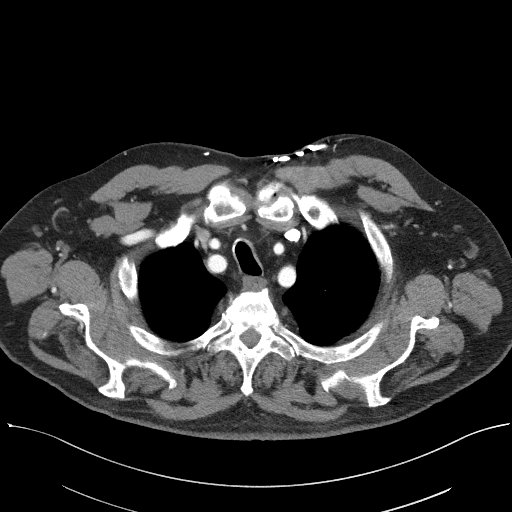
[im 92/100  lung]
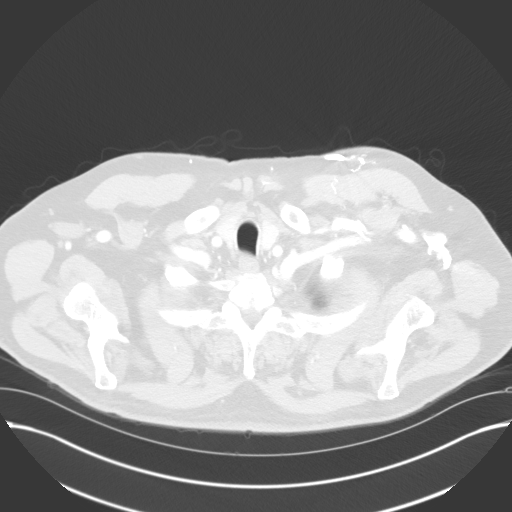

[Series 5: lung · axial · 0.83mm/px · z∈[-332,-290]mm · 2 of 100 slices shown]
[im 8/100  soft-tissue]
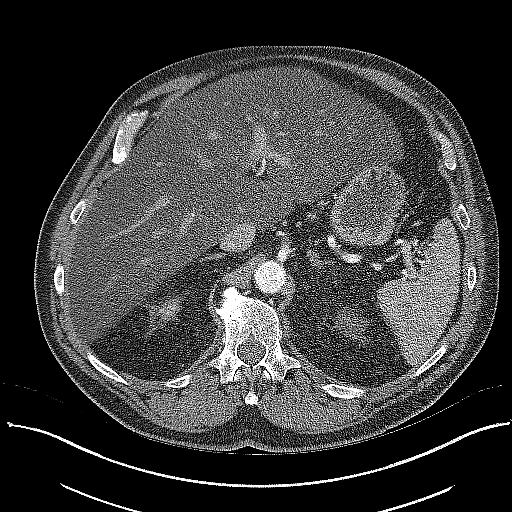
[im 22/100  soft-tissue]
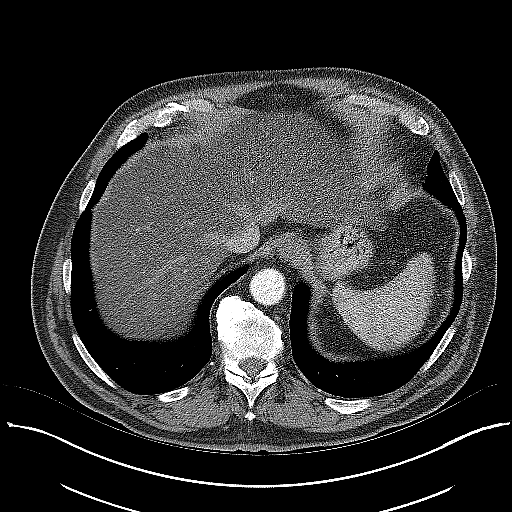

[Series 7: coronals · coronal · 0.60mm/px · 3 of 137 slices shown]
[im 35/137  soft-tissue]
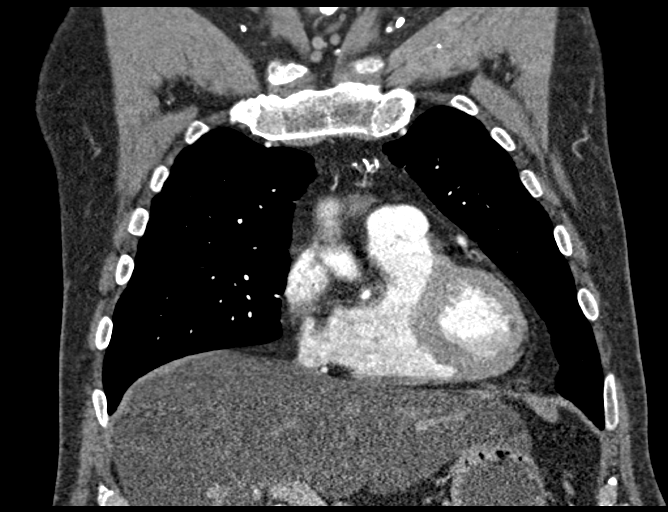
[im 69/137  soft-tissue]
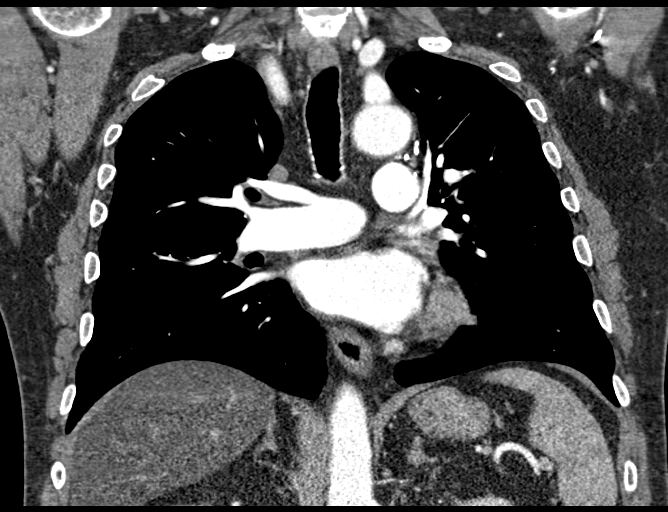
[im 103/137  soft-tissue]
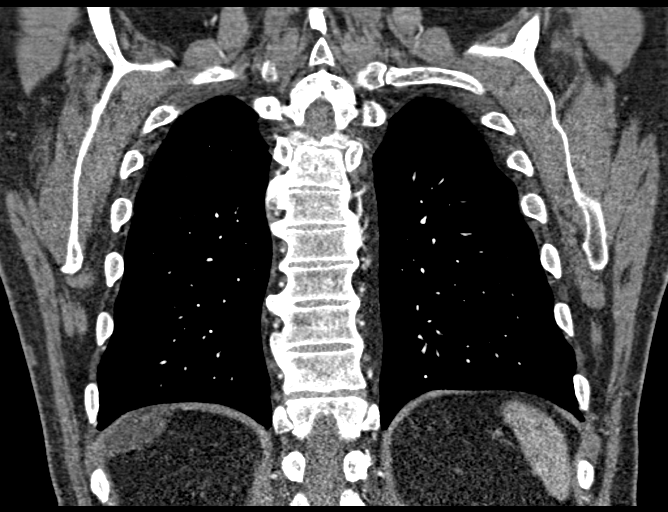

[18 of 46 positions shown; findings below may reference images not displayed]

FINDINGS: Cardiovascular: No change and mild dilation of the ascending aorta
up to 4.2 cm. Mild calcific atherosclerosis of the aorta. Great
vessel origins are patent. No evidence of acute arterial
abnormality. No evidence of central pulmonary embolism. Borderline
enlargement of the heart.

Mediastinum/Nodes: No enlarged mediastinal, hilar, or axillary lymph
nodes. Thyroid gland, trachea, and esophagus demonstrate no
significant findings.

Lungs/Pleura: Lungs are clear. No pleural effusion or pneumothorax.

Upper Abdomen: Redemonstrated hepatic steatosis. No acute
abnormality.

Musculoskeletal: No acute abnormality. Flowing anterior osteophytes
throughout the thoracic spine, compatible with diffuse idiopathic
skeletal hyperostosis.

Review of the MIP images confirms the above findings.
IMPRESSION: 1. No acute abnormality. Stable mild dilation of the ascending aorta
up to 4.2 cm. Recommend annual imaging followup by CTA or MRA. This
recommendation follows 9404
ACCF/AHA/AATS/ACR/ASA/SCA/MACINTYRE/BOMBY/DOSSOU/OPOKU Guidelines for the
Diagnosis and Management of Patients with Thoracic Aortic Disease.
Circulation. 9404; 121: E266-e369. Aortic aneurysm NOS (KBB1V-LL8.9)
2. Hepatic steatosis.

## 2022-04-12 ENCOUNTER — Other Ambulatory Visit: Payer: Self-pay | Admitting: Podiatry

## 2022-04-12 ENCOUNTER — Telehealth: Payer: Self-pay | Admitting: Podiatry

## 2022-04-12 MED ORDER — PREGABALIN 75 MG PO CAPS: 75.0000 mg | ORAL_CAPSULE | Freq: Two times a day (BID) | ORAL | 2 refills | Status: DC

## 2022-04-12 NOTE — Telephone Encounter (Signed)
Pt called stating he was returning your call.  ?I explained you were in clinic today with pts and asked if you could call him back this afternoon after 5 and he said that would be fine. ?

## 2022-04-13 ENCOUNTER — Telehealth: Payer: Self-pay

## 2022-04-13 ENCOUNTER — Ambulatory Visit (INDEPENDENT_AMBULATORY_CARE_PROVIDER_SITE_OTHER): Payer: PPO | Admitting: Internal Medicine

## 2022-04-13 ENCOUNTER — Encounter: Payer: Self-pay | Admitting: Internal Medicine

## 2022-04-13 VITALS — BP 118/78 | HR 59 | Resp 18 | Ht 71.0 in | Wt 237.2 lb

## 2022-04-13 DIAGNOSIS — E538 Deficiency of other specified B group vitamins: Secondary | ICD-10-CM

## 2022-04-13 DIAGNOSIS — F1010 Alcohol abuse, uncomplicated: Secondary | ICD-10-CM

## 2022-04-13 DIAGNOSIS — R350 Frequency of micturition: Secondary | ICD-10-CM

## 2022-04-13 DIAGNOSIS — D539 Nutritional anemia, unspecified: Secondary | ICD-10-CM | POA: Diagnosis not present

## 2022-04-13 DIAGNOSIS — R202 Paresthesia of skin: Secondary | ICD-10-CM

## 2022-04-13 DIAGNOSIS — I10 Essential (primary) hypertension: Secondary | ICD-10-CM | POA: Diagnosis not present

## 2022-04-13 LAB — COMPREHENSIVE METABOLIC PANEL
ALT: 17 U/L (ref 0–53)
AST: 23 U/L (ref 0–37)
Albumin: 3.7 g/dL (ref 3.5–5.2)
Alkaline Phosphatase: 55 U/L (ref 39–117)
BUN: 27 mg/dL — ABNORMAL HIGH (ref 6–23)
CO2: 29 mEq/L (ref 19–32)
Calcium: 9.2 mg/dL (ref 8.4–10.5)
Chloride: 104 mEq/L (ref 96–112)
Creatinine, Ser: 1.04 mg/dL (ref 0.40–1.50)
GFR: 73.35 mL/min (ref >60.00)
Glucose, Bld: 74 mg/dL (ref 70–99)
Potassium: 4.1 mEq/L (ref 3.5–5.1)
Sodium: 140 mEq/L (ref 135–145)
Total Bilirubin: 0.4 mg/dL (ref 0.2–1.2)
Total Protein: 6.3 g/dL (ref 6.0–8.3)

## 2022-04-13 LAB — CBC
HCT: 23.6 % — CL (ref 39.0–52.0)
Hemoglobin: 7.9 g/dL — CL (ref 13.0–17.0)
MCHC: 33.5 g/dL (ref 30.0–36.0)
MCV: 96.7 fl (ref 78.0–100.0)
Platelets: 177 10*3/uL (ref 150.0–400.0)
RBC: 2.44 Mil/uL — ABNORMAL LOW (ref 4.22–5.81)
RDW: 13.8 % (ref 11.5–15.5)
WBC: 7.4 10*3/uL (ref 4.0–10.5)

## 2022-04-13 LAB — VITAMIN B12: Vitamin B-12: 1249 pg/mL — ABNORMAL HIGH (ref 211–911)

## 2022-04-13 LAB — FERRITIN: Ferritin: 8.2 ng/mL — ABNORMAL LOW (ref 22.0–322.0)

## 2022-04-13 LAB — TSH: TSH: 0.75 u[IU]/mL (ref 0.35–5.50)

## 2022-04-13 LAB — VITAMIN D 25 HYDROXY (VIT D DEFICIENCY, FRACTURES): VITD: 74.88 ng/mL (ref 30.00–100.00)

## 2022-04-13 LAB — BRAIN NATRIURETIC PEPTIDE: Pro B Natriuretic peptide (BNP): 296 pg/mL — ABNORMAL HIGH (ref 0.0–100.0)

## 2022-04-13 NOTE — Patient Instructions (Signed)
We will have you cut the indapamide in half and take 1/2 pill daily. This may reduce the urinary urgency. ? ?We are checking the labs for the swelling and the tingling in the feet.  ?

## 2022-04-13 NOTE — Telephone Encounter (Signed)
CRITICAL VALUE STICKER ? ?CRITICAL VALUE: 7.9 Hemoglobin and 23.6 Hematocrit  ? ?RECEIVER (on-site recipient of call): Verdis Frederickson ? ?DATE & TIME NOTIFIED: 04/13/2022, 4:55PM ? ?MESSENGER (representative from lab): Santiago Glad ? ?MD NOTIFIED: Yes ? ?TIME OF NOTIFICATION: 4:56pm ? ?RESPONSE: Will contact pt. ?

## 2022-04-13 NOTE — Progress Notes (Signed)
   Subjective:   Patient ID: Lee Lee, male    DOB: 12/31/1950, 71 y.o.   MRN: [id number]  HPI The patient is a 71 YO man coming in for multiple concerns including urinary urgency/frequency. Also numbness in the legs for months. Has seen podiatry and they said it was a neuroma versus neuropathy. 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 Also is worried about BP being lower since he stopped drinking and wants to see if we can adjust this to help.  Review of Systems  Constitutional: Negative.   HENT: Negative.    Eyes: Negative.   Respiratory:  Negative for cough, chest tightness and shortness of breath.   Cardiovascular:  Negative for chest pain, palpitations and leg swelling.  Gastrointestinal:  Negative for abdominal distention, abdominal pain, constipation, diarrhea, nausea and vomiting.  Genitourinary:  Positive for frequency and urgency.  Musculoskeletal:  Positive for arthralgias, back pain and myalgias.  Skin: Negative.   Neurological:  Positive for numbness.  Psychiatric/Behavioral: Negative.     Objective:  Physical Exam Constitutional:      Appearance: He is well-developed.  HENT:     Head: Normocephalic and atraumatic.  Cardiovascular:     Rate and Rhythm: Normal rate and regular rhythm.  Pulmonary:     Effort: Pulmonary effort is normal. No respiratory distress.     Breath sounds: Normal breath sounds. No wheezing or rales.  Abdominal:     General: Bowel sounds are normal. There is no distension.     Palpations: Abdomen is soft.     Tenderness: There is no abdominal tenderness. There is no rebound.  Musculoskeletal:        General: Tenderness present.     Cervical back: Normal range of motion.  Skin:    General: Skin is warm and dry.  Neurological:     Mental Status: He is alert and oriented to person, place, and time.     Sensory: Sensory deficit present.     Coordination: Coordination normal.    Vitals:   04/13/22  1359  BP: 118/78  Pulse: (!) 59  Resp: 18  SpO2: 98%  Weight: 237 lb 3.2 oz (107.6 kg)  Height: 5' 11"  (1.803 m)    Assessment & Plan:

## 2022-04-14 ENCOUNTER — Telehealth: Payer: Self-pay

## 2022-04-14 ENCOUNTER — Other Ambulatory Visit (INDEPENDENT_AMBULATORY_CARE_PROVIDER_SITE_OTHER): Payer: PPO

## 2022-04-14 ENCOUNTER — Other Ambulatory Visit: Payer: Self-pay | Admitting: Internal Medicine

## 2022-04-14 DIAGNOSIS — M545 Low back pain, unspecified: Secondary | ICD-10-CM | POA: Diagnosis not present

## 2022-04-14 DIAGNOSIS — D509 Iron deficiency anemia, unspecified: Secondary | ICD-10-CM

## 2022-04-14 LAB — CBC
HCT: 23.9 % — CL (ref 39.0–52.0)
Hemoglobin: 8.1 g/dL — ABNORMAL LOW (ref 13.0–17.0)
MCHC: 34 g/dL (ref 30.0–36.0)
MCV: 96.8 fl (ref 78.0–100.0)
Platelets: 169 10*3/uL (ref 150.0–400.0)
RBC: 2.47 Mil/uL — ABNORMAL LOW (ref 4.22–5.81)
RDW: 13.7 % (ref 11.5–15.5)
WBC: 5.6 10*3/uL (ref 4.0–10.5)

## 2022-04-14 NOTE — Telephone Encounter (Signed)
CRITICAL VALUE STICKER ? ?CRITICAL VALUE: Hemoglobin 8.2 and Hematocrit 23.9 ? ?RECEIVER (on-site recipient of call): [place], [place] ? ?DATE & TIME NOTIFIED: 04/14/2022 at 11:03 am ? ?MESSENGER (representative from lab): Santiago Glad  ? ?MD NOTIFIED: Ronnald Ramp  ? ?TIME OF NOTIFICATION: 11:04 am  ? ?RESPONSE:  ? ?

## 2022-04-15 ENCOUNTER — Telehealth: Payer: Self-pay | Admitting: Pharmacy Technician

## 2022-04-15 ENCOUNTER — Telehealth: Payer: Self-pay | Admitting: Internal Medicine

## 2022-04-15 DIAGNOSIS — R202 Paresthesia of skin: Secondary | ICD-10-CM | POA: Insufficient documentation

## 2022-04-15 DIAGNOSIS — R350 Frequency of micturition: Secondary | ICD-10-CM | POA: Insufficient documentation

## 2022-04-15 NOTE — Telephone Encounter (Signed)
PT calls today in regards to their iron infusion appointment. PT was told to call back and let [place] know if he hadn't received a call to make this appointment by Thursday-Friday.   Looks like there were some issues scheduling it when I looked at Chart encounters.  CB: [phone]

## 2022-04-15 NOTE — Assessment & Plan Note (Signed)
Prior B vitamin deficiency and prior alcohol usage. Checking CBC, CMP, B12, TSH, vitamin d, ferritin to assess for metabolic causes. If no source identified he should likely have nerve conduction test. He has significant low back pain which could be contributing as well.

## 2022-04-15 NOTE — Telephone Encounter (Signed)
Auth Submission: NO AUTH NEEDED Payer: [employer] Medication & CPT/J Code(s) submitted: Feraheme (ferumoxytol) [id number] Route of submission (phone, fax, portal): PHONE Auth type: Buy/Bill Units/visits requested: X2 DOSES Reference number: [id number] Approval from: 04/15/22 to 11/15/22

## 2022-04-15 NOTE — Assessment & Plan Note (Signed)
Checking CBC, ferritin, B12 today.

## 2022-04-15 NOTE — Assessment & Plan Note (Signed)
Checking B12 level and adjust as needed.

## 2022-04-15 NOTE — Assessment & Plan Note (Signed)
Given BP lower than prior will cut back dose of indapamide to 1.25 mg daily (from 2.5 mg daily) to see if this helps reduce his urgency and frequency.

## 2022-04-15 NOTE — Assessment & Plan Note (Signed)
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 Checking CBC and CMP.

## 2022-04-15 NOTE — Assessment & Plan Note (Signed)
BP lower than prior. Likely due to stopping alcohol abuse. Will cut back indapamide to 1.25 mg daily and keep coreg 6.25 mg BID. Follow up 1 month for BP check and labs.Checking CMP today.

## 2022-04-17 ENCOUNTER — Encounter: Payer: Self-pay | Admitting: Internal Medicine

## 2022-04-19 ENCOUNTER — Ambulatory Visit (INDEPENDENT_AMBULATORY_CARE_PROVIDER_SITE_OTHER): Payer: PPO

## 2022-04-19 VITALS — BP 129/73 | HR 55 | Temp 97.8°F | Resp 18 | Ht 71.0 in | Wt 239.2 lb

## 2022-04-19 DIAGNOSIS — D539 Nutritional anemia, unspecified: Secondary | ICD-10-CM | POA: Diagnosis not present

## 2022-04-19 MED ORDER — SODIUM CHLORIDE 0.9 % IV SOLN
510.0000 mg | Freq: Once | INTRAVENOUS | Status: AC
  Administered 2022-04-19: 510 mg via INTRAVENOUS
  Filled 2022-04-19: qty 17

## 2022-04-19 NOTE — Progress Notes (Signed)
Diagnosis: Iron Deficiency Anemia  Provider:  Marshell Garfinkel, MD  Procedure: Infusion  IV Type: Peripheral, IV Location: L Forearm  Feraheme (Ferumoxytol), Dose: 510 mg  Infusion Start Time: 0906  Infusion Stop Time: 0923  Post Infusion IV Care: Observation period completed and Peripheral IV Discontinued  Discharge: Condition: Good, Destination: Home . AVS provided to patient.   Performed by:  Cleophus Molt, RN

## 2022-04-19 NOTE — Patient Instructions (Signed)

## 2022-04-21 ENCOUNTER — Ambulatory Visit: Payer: PPO | Admitting: Internal Medicine

## 2022-04-21 DIAGNOSIS — M545 Low back pain, unspecified: Secondary | ICD-10-CM | POA: Diagnosis not present

## 2022-04-28 ENCOUNTER — Ambulatory Visit (INDEPENDENT_AMBULATORY_CARE_PROVIDER_SITE_OTHER): Payer: PPO

## 2022-04-28 VITALS — BP 110/65 | HR 56 | Temp 98.2°F | Resp 18 | Ht 71.0 in | Wt 238.2 lb

## 2022-04-28 DIAGNOSIS — D539 Nutritional anemia, unspecified: Secondary | ICD-10-CM

## 2022-04-28 MED ORDER — SODIUM CHLORIDE 0.9 % IV SOLN
510.0000 mg | Freq: Once | INTRAVENOUS | Status: AC
  Administered 2022-04-28: 510 mg via INTRAVENOUS
  Filled 2022-04-28: qty 17

## 2022-04-28 NOTE — Progress Notes (Signed)
Diagnosis: Iron Deficiency Anemia  Provider:  Marshell Garfinkel, MD  Procedure: Infusion  IV Type: Peripheral, IV Location: L Forearm  Feraheme (Ferumoxytol), Dose: 510 mg  Infusion Start Time: 0904  Infusion Stop Time: 0922  Post Infusion IV Care: Peripheral IV Discontinued  Discharge: Condition: Good, Destination: Home . AVS provided to patient.   Performed by:  Cleophus Molt, RN

## 2022-04-29 ENCOUNTER — Ambulatory Visit (INDEPENDENT_AMBULATORY_CARE_PROVIDER_SITE_OTHER): Payer: PPO | Admitting: Internal Medicine

## 2022-04-29 ENCOUNTER — Encounter: Payer: Self-pay | Admitting: Internal Medicine

## 2022-04-29 VITALS — BP 104/68 | HR 56 | Temp 97.8°F | Resp 16 | Ht 71.0 in | Wt 239.0 lb

## 2022-04-29 DIAGNOSIS — D539 Nutritional anemia, unspecified: Secondary | ICD-10-CM

## 2022-04-29 DIAGNOSIS — I10 Essential (primary) hypertension: Secondary | ICD-10-CM | POA: Diagnosis not present

## 2022-04-29 DIAGNOSIS — R972 Elevated prostate specific antigen [PSA]: Secondary | ICD-10-CM | POA: Diagnosis not present

## 2022-04-29 DIAGNOSIS — E519 Thiamine deficiency, unspecified: Secondary | ICD-10-CM

## 2022-04-29 DIAGNOSIS — I959 Hypotension, unspecified: Secondary | ICD-10-CM | POA: Diagnosis not present

## 2022-04-29 DIAGNOSIS — R739 Hyperglycemia, unspecified: Secondary | ICD-10-CM | POA: Diagnosis not present

## 2022-04-29 DIAGNOSIS — R600 Localized edema: Secondary | ICD-10-CM | POA: Diagnosis not present

## 2022-04-29 DIAGNOSIS — E538 Deficiency of other specified B group vitamins: Secondary | ICD-10-CM

## 2022-04-29 LAB — CBC WITH DIFFERENTIAL/PLATELET
Basophils Absolute: 0 10*3/uL (ref 0.0–0.1)
Basophils Relative: 0.4 % (ref 0.0–3.0)
Eosinophils Absolute: 0.1 10*3/uL (ref 0.0–0.7)
Eosinophils Relative: 2.9 % (ref 0.0–5.0)
HCT: 31.3 % — ABNORMAL LOW (ref 39.0–52.0)
Hemoglobin: 10.5 g/dL — ABNORMAL LOW (ref 13.0–17.0)
Lymphocytes Relative: 23.4 % (ref 12.0–46.0)
Lymphs Abs: 1.2 10*3/uL (ref 0.7–4.0)
MCHC: 33.7 g/dL (ref 30.0–36.0)
MCV: 101.9 fl — ABNORMAL HIGH (ref 78.0–100.0)
Monocytes Absolute: 0.5 10*3/uL (ref 0.1–1.0)
Monocytes Relative: 10.3 % (ref 3.0–12.0)
Neutro Abs: 3.2 10*3/uL (ref 1.4–7.7)
Neutrophils Relative %: 63 % (ref 43.0–77.0)
Platelets: 157 10*3/uL (ref 150.0–400.0)
RBC: 3.07 Mil/uL — ABNORMAL LOW (ref 4.22–5.81)
RDW: 19.4 % — ABNORMAL HIGH (ref 11.5–15.5)
WBC: 5.1 10*3/uL (ref 4.0–10.5)

## 2022-04-29 LAB — HEMOGLOBIN A1C: Hgb A1c MFr Bld: 4.5 % — ABNORMAL LOW (ref 4.6–6.5)

## 2022-04-29 LAB — CORTISOL: Cortisol, Plasma: 9.2 ug/dL

## 2022-04-29 LAB — IBC + FERRITIN
Ferritin: 207.4 ng/mL (ref 22.0–322.0)
Iron: 569 ug/dL — ABNORMAL HIGH (ref 42–165)
Saturation Ratios: 176.7 % — ABNORMAL HIGH (ref 20.0–50.0)
TIBC: 322 ug/dL (ref 250.0–450.0)
Transferrin: 230 mg/dL (ref 212.0–360.0)

## 2022-04-29 LAB — URINALYSIS, ROUTINE W REFLEX MICROSCOPIC
Bilirubin Urine: NEGATIVE
Hgb urine dipstick: NEGATIVE
Ketones, ur: NEGATIVE
Leukocytes,Ua: NEGATIVE
Nitrite: NEGATIVE
Specific Gravity, Urine: 1.02 (ref 1.000–1.030)
Total Protein, Urine: NEGATIVE
Urine Glucose: >=1000 — AB
Urobilinogen, UA: 0.2 (ref 0.0–1.0)
pH: 6 (ref 5.0–8.0)

## 2022-04-29 LAB — BASIC METABOLIC PANEL
BUN: 19 mg/dL (ref 6–23)
CO2: 31 mEq/L (ref 19–32)
Calcium: 9.6 mg/dL (ref 8.4–10.5)
Chloride: 98 mEq/L (ref 96–112)
Creatinine, Ser: 0.9 mg/dL (ref 0.40–1.50)
GFR: 87.22 mL/min (ref >60.00)
Glucose, Bld: 236 mg/dL — ABNORMAL HIGH (ref 70–99)
Potassium: 4.1 mEq/L (ref 3.5–5.1)
Sodium: 138 mEq/L (ref 135–145)

## 2022-04-29 LAB — BRAIN NATRIURETIC PEPTIDE: Pro B Natriuretic peptide (BNP): 151 pg/mL — ABNORMAL HIGH (ref 0.0–100.0)

## 2022-04-29 LAB — FOLATE: Folate: >24.2 ng/mL (ref >5.9)

## 2022-04-29 LAB — PSA: PSA: 2.36 ng/mL (ref 0.10–4.00)

## 2022-04-29 LAB — TROPONIN I (HIGH SENSITIVITY): High Sens Troponin I: 5 ng/L (ref 2–17)

## 2022-04-29 MED ORDER — CARVEDILOL 3.125 MG PO TABS: 3.1250 mg | ORAL_TABLET | Freq: Two times a day (BID) | ORAL | 0 refills | Status: DC

## 2022-04-29 NOTE — Patient Instructions (Signed)
Anemia  Anemia is a condition in which there is not enough red blood cells or hemoglobin in the blood. Hemoglobin is a substance in red blood cells that carries oxygen. When you do not have enough red blood cells or hemoglobin (are anemic), your body cannot get enough oxygen and your organs may not work properly. As a result, you may feel very tired or have other problems. What are the causes? Common causes of anemia include: Excessive bleeding. Anemia can be caused by excessive bleeding inside or outside the body, including bleeding from the intestines or from heavy menstrual periods in females. Poor nutrition. Long-lasting (chronic) kidney, thyroid, and liver disease. Bone marrow disorders, spleen problems, and blood disorders. Cancer and treatments for cancer. HIV (human immunodeficiency virus) and AIDS (acquired immunodeficiency syndrome). Infections, medicines, and autoimmune disorders that destroy red blood cells. What are the signs or symptoms? Symptoms of this condition include: Minor weakness. Dizziness. Headache, or difficulties concentrating and sleeping. Heartbeats that feel irregular or faster than normal (palpitations). Shortness of breath, especially with exercise. Pale skin, lips, and nails, or cold hands and feet. Indigestion and nausea. Symptoms may occur suddenly or develop slowly. If your anemia is mild, you may not have symptoms. How is this diagnosed? This condition is diagnosed based on blood tests, your medical history, and a physical exam. In some cases, a test may be needed in which cells are removed from the soft tissue inside of a bone and looked at under a microscope (bone marrow biopsy). Your health care provider may also check your stool (feces) for blood and may do additional testing to look for the cause of your bleeding. Other tests may include: Imaging tests, such as a CT scan or MRI. A procedure to see inside your esophagus and stomach (endoscopy). A  procedure to see inside your colon and rectum (colonoscopy). How is this treated? Treatment for this condition depends on the cause. If you continue to lose a lot of blood, you may need to be treated at a hospital. Treatment may include: Taking supplements of iron, vitamin B12, or folic acid. Taking a hormone medicine (erythropoietin) that can help to stimulate red blood cell growth. Having a blood transfusion. This may be needed if you lose a lot of blood. Making changes to your diet. Having surgery to remove your spleen. Follow these instructions at home: Take over-the-counter and prescription medicines only as told by your health care provider. Take supplements only as told by your health care provider. Follow any diet instructions that you were given by your health care provider. Keep all follow-up visits as told by your health care provider. This is important. Contact a health care provider if: You develop new bleeding anywhere in the body. Get help right away if: You are very weak. You are short of breath. You have pain in your abdomen or chest. You are dizzy or feel faint. You have trouble concentrating. You have bloody stools, black stools, or tarry stools. You vomit repeatedly or you vomit up blood. These symptoms may represent a serious problem that is an emergency. Do not wait to see if the symptoms will go away. Get medical help right away. Call your local emergency services (911 in the [place] Do not drive yourself to the hospital. Summary Anemia is a condition in which you do not have enough red blood cells or enough of a substance in your red blood cells that carries oxygen (hemoglobin). Symptoms may occur suddenly or develop slowly. If your anemia   is mild, you may not have symptoms. This condition is diagnosed with blood tests, a medical history, and a physical exam. Other tests may be needed. Treatment for this condition depends on the cause of the anemia. This  information is not intended to replace advice given to you by your health care provider. Make sure you discuss any questions you have with your health care provider. Document Revised: 09/29/2021 Document Reviewed: 10/23/2019 Elsevier Patient Education  [place].

## 2022-04-29 NOTE — Progress Notes (Unsigned)
Subjective:  Patient ID: Lee Lee, male    DOB: 12/23/1950  Age: 71 y.o. MRN: [id number]  CC: Anemia and Hypertension   HPI Lee Lee presents for f/up -   He recently saw another physician for anemia. He is not taking thiamine but he is status post 2 iron infusions.  He complains of a several week history of lower extremity edema.  He complains of weakness, fatigue, and shortness of breath.  He tells me he continues to abstain from alcohol intake.  Outpatient Medications Prior to Visit  Medication Sig Dispense Refill   b complex vitamins capsule Take 1 capsule by mouth daily.     busPIRone (BUSPAR) 5 MG tablet Take 1 tablet (5 mg total) by mouth 3 (three) times daily. 270 tablet 0   desvenlafaxine (PRISTIQ) 50 MG 24 hr tablet TAKE ONE TABLET BY MOUTH DAILY 90 tablet 0   midodrine (PROAMATINE) 10 MG tablet Take 1 tablet (10 mg total) by mouth 3 (three) times daily as needed (While awake and on your feet to prevent dizziness). 240 tablet 3   naltrexone (DEPADE) 50 MG tablet Take 1 tablet (50 mg total) by mouth daily. 90 tablet 1   rOPINIRole (REQUIP) 0.5 MG tablet Take 1 tablet (0.5 mg total) by mouth at bedtime. 90 tablet 1   traZODone (DESYREL) 150 MG tablet Take 1 tablet (150 mg total) by mouth at bedtime as needed for sleep. 90 tablet 1   carvedilol (COREG) 6.25 MG tablet Take 1 tablet (6.25 mg total) by mouth 2 (two) times daily with a meal. 60 tablet 2   indapamide (LOZOL) 2.5 MG tablet TAKE ONE TABLET BY MOUTH DAILY 90 tablet 0   irbesartan (AVAPRO) 300 MG tablet TAKE ONE TABLET BY MOUTH DAILY 90 tablet 1   pregabalin (LYRICA) 75 MG capsule Take 1 capsule (75 mg total) by mouth 2 (two) times daily. (Patient not taking: Reported on 04/13/2022) 60 capsule 2   rosuvastatin (CRESTOR) 5 MG tablet Take 1 tablet (5 mg total) by mouth daily. 90 tablet 3   thiamine 100 MG tablet Take 1 tablet (100 mg total) by mouth daily. (Patient not taking: Reported on 04/13/2022) 14 tablet  0   No facility-administered medications prior to visit.    ROS Review of Systems  Constitutional:  Positive for fatigue and unexpected weight change (wt gain). Negative for appetite change, chills, diaphoresis and fever.  HENT: Negative.    Eyes: Negative.   Respiratory:  Positive for shortness of breath. Negative for cough, chest tightness and wheezing.   Cardiovascular:  Positive for leg swelling. Negative for chest pain and palpitations.  Gastrointestinal:  Negative for abdominal pain, constipation, diarrhea, nausea and vomiting.  Endocrine: Negative.   Genitourinary: Negative.  Negative for difficulty urinating and dysuria.  Musculoskeletal: Negative.  Negative for arthralgias and myalgias.  Skin: Negative.   Neurological:  Positive for weakness. Negative for dizziness and light-headedness.  Hematological:  Negative for adenopathy. Does not bruise/bleed easily.  Psychiatric/Behavioral: Negative.     Objective:  BP 104/68 (BP Location: Left Arm, Patient Position: Sitting, Cuff Size: Large)   Pulse (!) 56   Temp 97.8 F (36.6 C) (Oral)   Resp 16   Ht 5' 11"  (1.803 m)   Wt 239 lb (108.4 kg)   SpO2 97%   BMI 33.33 kg/m   BP Readings from Last 3 Encounters:  04/29/22 104/68  04/28/22 110/65  04/19/22 129/73    Wt Readings from Last 3 Encounters:  04/29/22 239 lb (108.4 kg)  04/28/22 238 lb 3.2 oz (108 kg)  04/19/22 239 lb 3.2 oz (108.5 kg)    Physical Exam Vitals reviewed.  HENT:     Nose: Nose normal.     Mouth/Throat:     Mouth: Mucous membranes are moist.  Eyes:     General: No scleral icterus.    Conjunctiva/sclera: Conjunctivae normal.  Cardiovascular:     Rate and Rhythm: Regular rhythm. Bradycardia present.     Pulses:          Dorsalis pedis pulses are 1+ on the right side and 1+ on the left side.       Posterior tibial pulses are 1+ on the right side and 1+ on the left side.     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     Breath sounds: No stridor. No wheezing, rhonchi or rales.  Abdominal:     General: Abdomen is flat.     Palpations: There is no mass.     Tenderness: There is no abdominal tenderness. There is no guarding.     Hernia: No hernia is present.  Musculoskeletal:     Right lower leg: 1+ Pitting Edema present.     Left lower leg: 1+ Pitting Edema present.  Feet:     Right foot:     Skin integrity: Skin integrity normal.     Left foot:     Skin integrity: Skin integrity normal.  Neurological:     General: No focal deficit present.     Mental Status: He is alert.  Psychiatric:        Mood and Affect: Mood normal.        Behavior: Behavior normal.    Lab Results  Component Value Date   WBC 5.1 04/29/2022   HGB 10.5 (L) 04/29/2022   HCT 31.3 (L) 04/29/2022   PLT 157.0 04/29/2022   GLUCOSE 236 (H) 04/29/2022   CHOL 150 08/17/2021   TRIG 71 08/17/2021   HDL 73 08/17/2021   LDLDIRECT 101.0 12/26/2018   LDLCALC 63 08/17/2021   ALT 17 04/13/2022   AST 23 04/13/2022   NA 138 04/29/2022   K 4.1 04/29/2022   CL 98 04/29/2022   CREATININE 0.90 04/29/2022   BUN 19 04/29/2022   CO2 31 04/29/2022   TSH 0.75 04/13/2022   PSA 2.36 04/29/2022   INR 1.1 (H) 05/19/2021   HGBA1C 4.5 (L) 04/29/2022    CT Head Wo Contrast  Result Date: 12/16/2021 CLINICAL DATA:  [place], hit head on floor, abrasion EXAM: CT HEAD WITHOUT CONTRAST TECHNIQUE: Contiguous axial images were obtained from the base of the skull through the vertex without intravenous contrast. RADIATION DOSE REDUCTION: This exam was performed according to the departmental dose-optimization program which includes automated exposure control, adjustment of the mA and/or kV according to patient size and/or use of iterative reconstruction technique. COMPARISON:  12/15/2021 FINDINGS: Brain: No acute infarct or hemorrhage. Lateral ventricles and midline structures are  unremarkable. No acute extra-axial fluid collections. No mass effect. Vascular: No hyperdense vessel or unexpected calcification. Skull: Normal. Negative for fracture or focal lesion. Sinuses/Orbits: No acute finding. Other: None. IMPRESSION: 1. Stable head CT, no acute intracranial process. Electronically Signed   By: Randa Ngo M.D.   On: 12/16/2021 19:11   DG Shoulder Left  Result Date: 12/16/2021 CLINICAL DATA:  fall EXAM: LEFT SHOULDER - 2+ VIEW COMPARISON:  Chest x-ray 09/27/2021. FINDINGS: No evidence of fracture, dislocation, or joint effusion. Acromioclavicular degenerative changes. No aggressive appearing focal bone abnormality. Soft tissues are unremarkable. IMPRESSION: No acute displaced fracture or dislocation. Electronically Signed   By: Iven Finn M.D.   On: 12/16/2021 19:00    Assessment & Plan:   [place] was seen today for anemia and hypertension.  Diagnoses and all orders for this visit:  Deficiency anemia- His H&H have improved but he still anemic.  His iron level is high.  Will monitor for other vitamin deficiencies. -     IBC + Ferritin; Future -     Vitamin B1; Future -     Reticulocytes; Future -     CBC with Differential/Platelet; Future -     Folate; Future -     Folate -     CBC with Differential/Platelet -     Reticulocytes -     Vitamin B1 -     IBC + Ferritin  B12 deficiency- Folate is normal. -     Folate; Future -     Folate  Bilateral leg edema- He may be developing beriberi so I have asked him to start taking thiamine.  His EKG and labs are reassuring.  There is no protein in the urine.  This could be caused by Lyrica. -     Urinalysis, Routine w reflex microscopic; Future -     Troponin I (High Sensitivity); Future -     Brain natriuretic peptide; Future -     D-dimer, quantitative; Future -     EKG 12-Lead -     Basic metabolic panel; Future -     Basic metabolic panel -     D-dimer, quantitative -     Brain natriuretic peptide -      Troponin I (High Sensitivity) -     Urinalysis, Routine w reflex microscopic  Hypotension, unspecified hypotension type- His blood pressure is overcontrolled.  Will discontinue irbesartan and indapamide.  Will lower the dose of carvedilol. -     Cortisol; Future -     Basic metabolic panel; Future -     Basic metabolic panel -     Cortisol  PSA elevation- His PSA is normal now. -     PSA; Future -     PSA  Essential hypertension- His blood pressure is overcontrolled. -     Basic metabolic panel; Future -     carvedilol (COREG) 3.125 MG tablet; Take 1 tablet (3.125 mg total) by mouth 2 (two) times daily with a meal. -     Basic metabolic panel  Hyperglycemia- His random blood sugar is elevated.  His A1c is low at 4.5% (corrected for the anemia it is 4.712%).  Will check a fructosamine. -     Hemoglobin A1c; Future -     Hemoglobin A1c -     Fructosamine; Future  Thiamine deficiency -     thiamine 100 MG tablet; Take 1 tablet (100 mg total) by mouth daily.   I have discontinued Camille Thau. [place]'s rosuvastatin, indapamide, irbesartan, carvedilol, and pregabalin. I am also having him start on carvedilol. Additionally, I am having him maintain his b complex vitamins, midodrine, desvenlafaxine, traZODone, busPIRone, naltrexone, rOPINIRole, and thiamine.  Meds ordered this encounter  Medications   carvedilol (COREG) 3.125 MG tablet    Sig: Take 1 tablet (3.125 mg total) by mouth 2 (two) times daily with a  meal.    Dispense:  60 tablet    Refill:  0   thiamine 100 MG tablet    Sig: Take 1 tablet (100 mg total) by mouth daily.    Dispense:  90 tablet    Refill:  0    I spent 45 minutes in preparing to see the patient by review of recent labs, office notes, obtaining and reviewing separately obtained history, communicating with the patient, , ordering medications and labs, and documenting clinical information in the EHR including the differential Dx, treatment, and any further  evaluation and management of multiple complex medical issues.     Follow-up: Return in about 6 weeks (around 06/10/2022).  Scarlette Calico, MD

## 2022-04-30 ENCOUNTER — Other Ambulatory Visit: Payer: PPO

## 2022-04-30 DIAGNOSIS — R739 Hyperglycemia, unspecified: Secondary | ICD-10-CM

## 2022-05-01 MED ORDER — THIAMINE HCL 100 MG PO TABS: 100.0000 mg | ORAL_TABLET | Freq: Every day | ORAL | 0 refills | Status: DC

## 2022-05-04 LAB — FRUCTOSAMINE: Fructosamine: 243 umol/L (ref 205–285)

## 2022-05-05 DIAGNOSIS — H40023 Open angle with borderline findings, high risk, bilateral: Secondary | ICD-10-CM | POA: Diagnosis not present

## 2022-05-06 DIAGNOSIS — M545 Low back pain, unspecified: Secondary | ICD-10-CM | POA: Diagnosis not present

## 2022-05-06 NOTE — Telephone Encounter (Signed)
Pt first appt was 04/19/22

## 2022-05-09 LAB — D-DIMER, QUANTITATIVE: D-Dimer, Quant: 0.5 mcg/mL FEU — ABNORMAL HIGH (ref <0.50)

## 2022-05-09 LAB — VITAMIN B1: Vitamin B1 (Thiamine): 843 nmol/L — ABNORMAL HIGH (ref 8–30)

## 2022-05-09 LAB — RETICULOCYTES
ABS Retic: 223650 cells/uL — ABNORMAL HIGH (ref 25000–90000)
Retic Ct Pct: 7.1 %

## 2022-05-13 DIAGNOSIS — M545 Low back pain, unspecified: Secondary | ICD-10-CM | POA: Diagnosis not present

## 2022-05-18 ENCOUNTER — Other Ambulatory Visit: Payer: Self-pay | Admitting: Internal Medicine

## 2022-05-18 MED ORDER — DESVENLAFAXINE SUCCINATE ER 50 MG PO TB24: 50.0000 mg | ORAL_TABLET | Freq: Every day | ORAL | 0 refills | Status: DC

## 2022-05-20 ENCOUNTER — Ambulatory Visit: Payer: PPO | Admitting: Internal Medicine

## 2022-05-20 DIAGNOSIS — M545 Low back pain, unspecified: Secondary | ICD-10-CM | POA: Diagnosis not present

## 2022-05-21 ENCOUNTER — Other Ambulatory Visit: Payer: Self-pay | Admitting: Internal Medicine

## 2022-05-21 DIAGNOSIS — I10 Essential (primary) hypertension: Secondary | ICD-10-CM

## 2022-05-24 MED ORDER — CARVEDILOL 3.125 MG PO TABS: 3.1250 mg | ORAL_TABLET | Freq: Two times a day (BID) | ORAL | 0 refills | Status: DC

## 2022-05-25 ENCOUNTER — Other Ambulatory Visit: Payer: Self-pay | Admitting: Internal Medicine

## 2022-05-25 DIAGNOSIS — I10 Essential (primary) hypertension: Secondary | ICD-10-CM

## 2022-05-25 MED ORDER — CARVEDILOL 3.125 MG PO TABS: 3.1250 mg | ORAL_TABLET | Freq: Two times a day (BID) | ORAL | 0 refills | Status: DC

## 2022-05-26 ENCOUNTER — Other Ambulatory Visit: Payer: Self-pay | Admitting: Internal Medicine

## 2022-05-26 DIAGNOSIS — F411 Generalized anxiety disorder: Secondary | ICD-10-CM

## 2022-05-27 ENCOUNTER — Other Ambulatory Visit: Payer: Self-pay | Admitting: Internal Medicine

## 2022-05-27 ENCOUNTER — Encounter: Payer: Self-pay | Admitting: Internal Medicine

## 2022-05-27 DIAGNOSIS — M545 Low back pain, unspecified: Secondary | ICD-10-CM | POA: Diagnosis not present

## 2022-05-27 DIAGNOSIS — R202 Paresthesia of skin: Secondary | ICD-10-CM

## 2022-06-03 DIAGNOSIS — M545 Low back pain, unspecified: Secondary | ICD-10-CM | POA: Diagnosis not present

## 2022-06-10 ENCOUNTER — Ambulatory Visit (HOSPITAL_COMMUNITY)
Admission: RE | Admit: 2022-06-10 | Discharge: 2022-06-10 | Disposition: A | Discharge status: Home / Self Care | Payer: PPO | Source: Ambulatory Visit | Attending: Internal Medicine | Admitting: Internal Medicine

## 2022-06-10 ENCOUNTER — Ambulatory Visit (INDEPENDENT_AMBULATORY_CARE_PROVIDER_SITE_OTHER): Payer: PPO | Admitting: Internal Medicine

## 2022-06-10 ENCOUNTER — Encounter: Payer: Self-pay | Admitting: Internal Medicine

## 2022-06-10 VITALS — BP 134/76 | HR 58 | Temp 98.1°F | Resp 16 | Ht 71.0 in | Wt 244.0 lb

## 2022-06-10 DIAGNOSIS — I739 Peripheral vascular disease, unspecified: Secondary | ICD-10-CM | POA: Diagnosis not present

## 2022-06-10 DIAGNOSIS — R600 Localized edema: Secondary | ICD-10-CM | POA: Diagnosis not present

## 2022-06-10 DIAGNOSIS — I10 Essential (primary) hypertension: Secondary | ICD-10-CM

## 2022-06-10 DIAGNOSIS — D539 Nutritional anemia, unspecified: Secondary | ICD-10-CM

## 2022-06-10 DIAGNOSIS — M545 Low back pain, unspecified: Secondary | ICD-10-CM | POA: Diagnosis not present

## 2022-06-10 DIAGNOSIS — G2581 Restless legs syndrome: Secondary | ICD-10-CM | POA: Diagnosis not present

## 2022-06-10 LAB — BASIC METABOLIC PANEL
BUN: 25 mg/dL — ABNORMAL HIGH (ref 6–23)
CO2: 31 mEq/L (ref 19–32)
Calcium: 9.4 mg/dL (ref 8.4–10.5)
Chloride: 98 mEq/L (ref 96–112)
Creatinine, Ser: 0.88 mg/dL (ref 0.40–1.50)
GFR: 87.74 mL/min (ref >60.00)
Glucose, Bld: 212 mg/dL — ABNORMAL HIGH (ref 70–99)
Potassium: 3.9 mEq/L (ref 3.5–5.1)
Sodium: 135 mEq/L (ref 135–145)

## 2022-06-10 LAB — CBC WITH DIFFERENTIAL/PLATELET
Basophils Absolute: 0 10*3/uL (ref 0.0–0.1)
Basophils Relative: 0.5 % (ref 0.0–3.0)
Eosinophils Absolute: 0.2 10*3/uL (ref 0.0–0.7)
Eosinophils Relative: 2.6 % (ref 0.0–5.0)
HCT: 35.4 % — ABNORMAL LOW (ref 39.0–52.0)
Hemoglobin: 12.1 g/dL — ABNORMAL LOW (ref 13.0–17.0)
Lymphocytes Relative: 19.9 % (ref 12.0–46.0)
Lymphs Abs: 1.2 10*3/uL (ref 0.7–4.0)
MCHC: 34.1 g/dL (ref 30.0–36.0)
MCV: 98.6 fl (ref 78.0–100.0)
Monocytes Absolute: 0.5 10*3/uL (ref 0.1–1.0)
Monocytes Relative: 8.2 % (ref 3.0–12.0)
Neutro Abs: 4.2 10*3/uL (ref 1.4–7.7)
Neutrophils Relative %: 68.8 % (ref 43.0–77.0)
Platelets: 172 10*3/uL (ref 150.0–400.0)
RBC: 3.58 Mil/uL — ABNORMAL LOW (ref 4.22–5.81)
RDW: 14.5 % (ref 11.5–15.5)
WBC: 6.1 10*3/uL (ref 4.0–10.5)

## 2022-06-10 LAB — BRAIN NATRIURETIC PEPTIDE: Pro B Natriuretic peptide (BNP): 114 pg/mL — ABNORMAL HIGH (ref 0.0–100.0)

## 2022-06-10 LAB — D-DIMER, QUANTITATIVE: D-Dimer, Quant: 0.54 mcg/mL FEU — ABNORMAL HIGH (ref <0.50)

## 2022-06-10 MED ORDER — ROPINIROLE HCL 1 MG PO TABS: 1.0000 mg | ORAL_TABLET | Freq: Every day | ORAL | 1 refills | Status: DC

## 2022-06-10 NOTE — Progress Notes (Unsigned)
Subjective:  Patient ID: Lee Lee, male    DOB: [birth year]  Age: 71 y.o. MRN: [id number]  CC: Anemia and Hypertension   HPI Lee Lee presents for f/up -  He continues to complain of numbness, tingling, and edema in his lower extremities.  He denies claudication, chest pain, or shortness of breath.  He complains of weight gain.  He is abstaining from alcohol but he says he now has a sweet tooth.  He complains of worsening restless leg symptoms.  Outpatient Medications Prior to Visit  Medication Sig Dispense Refill   b complex vitamins capsule Take 1 capsule by mouth daily.     busPIRone (BUSPAR) 5 MG tablet TAKE ONE TABLET BY MOUTH THREE TIMES A DAY 270 tablet 0   carvedilol (COREG) 3.125 MG tablet Take 1 tablet (3.125 mg total) by mouth 2 (two) times daily with a meal. 180 tablet 0   desvenlafaxine (PRISTIQ) 50 MG 24 hr tablet Take 1 tablet (50 mg total) by mouth daily. 90 tablet 0   indapamide (LOZOL) 2.5 MG tablet TAKE ONE TABLET BY MOUTH DAILY 90 tablet 0   midodrine (PROAMATINE) 10 MG tablet Take 1 tablet (10 mg total) by mouth 3 (three) times daily as needed (While awake and on your feet to prevent dizziness). 240 tablet 3   naltrexone (DEPADE) 50 MG tablet Take 1 tablet (50 mg total) by mouth daily. 90 tablet 1   thiamine 100 MG tablet Take 1 tablet (100 mg total) by mouth daily. 90 tablet 0   traZODone (DESYREL) 150 MG tablet Take 1 tablet (150 mg total) by mouth at bedtime as needed for sleep. 90 tablet 1   rOPINIRole (REQUIP) 0.5 MG tablet Take 1 tablet (0.5 mg total) by mouth at bedtime. 90 tablet 1   No facility-administered medications prior to visit.    ROS Review of Systems  Constitutional:  Positive for unexpected weight change (wt gain). Negative for chills, diaphoresis and fatigue.  HENT: Negative.    Eyes: Negative.   Respiratory:  Negative for cough, chest tightness, shortness of breath and wheezing.   Cardiovascular:  Positive for leg swelling.  Negative for chest pain and palpitations.  Gastrointestinal:  Negative for abdominal pain, constipation, diarrhea, nausea and vomiting.  Endocrine: Negative.   Genitourinary: Negative.  Negative for difficulty urinating.  Musculoskeletal: Negative.  Negative for arthralgias and myalgias.  Skin: Negative.   Neurological:  Positive for numbness. Negative for dizziness and weakness.  Hematological:  Negative for adenopathy. Does not bruise/bleed easily.  Psychiatric/Behavioral:  Positive for sleep disturbance. Negative for agitation, decreased concentration, dysphoric mood and suicidal ideas. The patient is not nervous/anxious.     Objective:  BP 134/76 (BP Location: Right Arm, Patient Position: Sitting, Cuff Size: Large)   Pulse (!) 58   Temp 98.1 F (36.7 C) (Oral)   Resp 16   Ht 5' 11"  (1.803 m)   Wt 244 lb (110.7 kg)   SpO2 96%   BMI 34.03 kg/m   BP Readings from Last 3 Encounters:  06/10/22 134/76  04/29/22 104/68  04/28/22 110/65    Wt Readings from Last 3 Encounters:  06/10/22 244 lb (110.7 kg)  04/29/22 239 lb (108.4 kg)  04/28/22 238 lb 3.2 oz (108 kg)    Physical Exam Vitals reviewed.  Constitutional:      Appearance: He is not ill-appearing.  HENT:     Nose: Nose normal.     Mouth/Throat:     Mouth: Mucous membranes  are moist.  Eyes:     General: No scleral icterus.    Conjunctiva/sclera: Conjunctivae normal.  Cardiovascular:     Rate and Rhythm: Normal rate and regular rhythm.     Pulses:          Dorsalis pedis pulses are 1+ on the right side and 1+ on the left side.       Posterior tibial pulses are 0 on the right side and 0 on the left side.     Heart sounds: No murmur heard.    No gallop.  Pulmonary:     Effort: Pulmonary effort is normal.     Breath sounds: No stridor. No wheezing, rhonchi or rales.  Abdominal:     General: Abdomen is flat.     Palpations: There is no mass.     Tenderness: There is no abdominal tenderness. There is no guarding.      Hernia: No hernia is present.  Musculoskeletal:        General: No swelling.     Cervical back: Neck supple.     Right lower leg: 1+ Pitting Edema present.     Left lower leg: 1+ Pitting Edema present.  Feet:     Right foot:     Skin integrity: No ulcer, skin breakdown or erythema.     Toenail Condition: Right toenails are normal.     Left foot:     Skin integrity: No ulcer, skin breakdown or erythema.     Toenail Condition: Left toenails are normal.  Lymphadenopathy:     Cervical: No cervical adenopathy.  Skin:    General: Skin is warm and dry.     Findings: No rash.  Neurological:     General: No focal deficit present.     Mental Status: He is alert. Mental status is at baseline.     Gait: Gait normal.  Psychiatric:        Mood and Affect: Mood normal.        Behavior: Behavior normal.     Lab Results  Component Value Date   WBC 6.1 06/10/2022   HGB 12.1 (L) 06/10/2022   HCT 35.4 (L) 06/10/2022   PLT 172.0 06/10/2022   GLUCOSE 212 (H) 06/10/2022   CHOL 150 08/17/2021   TRIG 71 08/17/2021   HDL 73 08/17/2021   LDLDIRECT 101.0 12/26/2018   LDLCALC 63 08/17/2021   ALT 17 04/13/2022   AST 23 04/13/2022   NA 135 06/10/2022   K 3.9 06/10/2022   CL 98 06/10/2022   CREATININE 0.88 06/10/2022   BUN 25 (H) 06/10/2022   CO2 31 06/10/2022   TSH 0.75 04/13/2022   PSA 2.36 04/29/2022   INR 1.1 (H) 05/19/2021   HGBA1C 4.5 (L) 04/29/2022    CT Head Wo Contrast  Result Date: 12/16/2021 CLINICAL DATA:  [place], hit head on floor, abrasion EXAM: CT HEAD WITHOUT CONTRAST TECHNIQUE: Contiguous axial images were obtained from the base of the skull through the vertex without intravenous contrast. RADIATION DOSE REDUCTION: This exam was performed according to the departmental dose-optimization program which includes automated exposure control, adjustment of the mA and/or kV according to patient size and/or use of iterative reconstruction technique. COMPARISON:  12/15/2021  FINDINGS: Brain: No acute infarct or hemorrhage. Lateral ventricles and midline structures are unremarkable. No acute extra-axial fluid collections. No mass effect. Vascular: No hyperdense vessel or unexpected calcification. Skull: Normal. Negative for fracture or focal lesion. Sinuses/Orbits: No acute finding. Other: None. IMPRESSION: 1. Stable  head CT, no acute intracranial process. Electronically Signed   By: Randa Ngo M.D.   On: 12/16/2021 19:11   DG Shoulder Left  Result Date: 12/16/2021 CLINICAL DATA:  fall EXAM: LEFT SHOULDER - 2+ VIEW COMPARISON:  Chest x-ray 09/27/2021. FINDINGS: No evidence of fracture, dislocation, or joint effusion. Acromioclavicular degenerative changes. No aggressive appearing focal bone abnormality. Soft tissues are unremarkable. IMPRESSION: No acute displaced fracture or dislocation. Electronically Signed   By: Iven Finn M.D.   On: 12/16/2021 19:00   VAS [place] ABI WITH/WO TBI  Result Date: 06/11/2022  LOWER EXTREMITY DOPPLER STUDY Patient Name:  Lee Lee  Date of Exam:   06/11/2022 Medical Rec #: [id number]          Accession #:    [id number] Date of Birth: January 07, 1951          Patient Gender: M Patient Age:   87 years Exam Location:  [facility] Procedure:      VAS [place] ABI WITH/WO TBI Referring Phys: Scarlette Calico --------------------------------------------------------------------------------  Indications: Neuropathy. High Risk Factors: No history of smoking. Other Factors: Patient denies claudication.  Performing Technologist: Ralene Cork RVT  Examination Guidelines: A complete evaluation includes at minimum, Doppler waveform signals and systolic blood pressure reading at the level of bilateral brachial, anterior tibial, and posterior tibial arteries, when vessel segments are accessible. Bilateral testing is considered an integral part of a complete examination. Photoelectric Plethysmograph (PPG) waveforms and toe systolic pressure readings  are included as required and additional duplex testing as needed. Limited examinations for reoccurring indications may be performed as noted.  ABI Findings: +---------+------------------+-----+---------+--------+ Right    Rt Pressure (mmHg)IndexWaveform Comment  +---------+------------------+-----+---------+--------+ Brachial 123                                      +---------+------------------+-----+---------+--------+ PTA      163               1.28 triphasic         +---------+------------------+-----+---------+--------+ DP       168               1.32 triphasic         +---------+------------------+-----+---------+--------+ Matthew Folks               1.13                   +---------+------------------+-----+---------+--------+ +---------+------------------+-----+---------+-------+ Left     Lt Pressure (mmHg)IndexWaveform Comment +---------+------------------+-----+---------+-------+ Brachial 127                                     +---------+------------------+-----+---------+-------+ PTA      167               1.31 triphasic        +---------+------------------+-----+---------+-------+ DP       164               1.29 triphasic        +---------+------------------+-----+---------+-------+ Great Toe120               0.94                  +---------+------------------+-----+---------+-------+ +-------+-----------+-----------+------------+------------+ ABI/TBIToday's ABIToday's TBIPrevious ABIPrevious TBI +-------+-----------+-----------+------------+------------+ Right  [place]         1.13                                +-------+-----------+-----------+------------+------------+  Left   [place]         0.94                                +-------+-----------+-----------+------------+------------+  No previous ABI.  Summary: Right: Resting right ankle-brachial index indicates noncompressible right lower extremity arteries. The right toe-brachial  index is normal. Normal pedal waveforms. Left: Resting left ankle-brachial index indicates noncompressible left lower extremity arteries. The left toe-brachial index is normal. Normal pedal waveforms. *See table(s) above for measurements and observations.  Electronically signed by Orlie Pollen on 06/11/2022 at 5:05:31 PM.    Final    VAS [place] LOWER EXTREMITY VENOUS (DVT)  Result Date: 06/11/2022  Lower Venous DVT Study Patient Name:  Lee Lee  Date of Exam:   06/10/2022 Medical Rec #: [id number]          Accession #:    [id number] Date of Birth: 25-Sep-1951          Patient Gender: M Patient Age:   60 years Exam Location:  [facility] Procedure:      VAS [place] LOWER EXTREMITY VENOUS (DVT) Referring Phys: Scarlette Calico --------------------------------------------------------------------------------  Indications: Bilateral edema since February 2023 Other Indications: HTN, Atrial flutter, SVT. Performing Technologist: Alvia Grove RVT  Examination Guidelines: A complete evaluation includes B-mode imaging, spectral Doppler, color Doppler, and power Doppler as needed of all accessible portions of each vessel. Bilateral testing is considered an integral part of a complete examination. Limited examinations for reoccurring indications may be performed as noted. The reflux portion of the exam is performed with the patient in reverse Trendelenburg.  +---------+---------------+---------+-----------+----------+--------------+ RIGHT    CompressibilityPhasicitySpontaneityPropertiesThrombus Aging +---------+---------------+---------+-----------+----------+--------------+ CFV      Full           Yes      Yes                                 +---------+---------------+---------+-----------+----------+--------------+ SFJ      Full           Yes      Yes                                 +---------+---------------+---------+-----------+----------+--------------+ FV Prox  Full           Yes       Yes                                 +---------+---------------+---------+-----------+----------+--------------+ FV Mid   Full           Yes      Yes                                 +---------+---------------+---------+-----------+----------+--------------+ FV DistalFull           Yes      Yes                                 +---------+---------------+---------+-----------+----------+--------------+ PFV      Full           Yes      Yes                                 +---------+---------------+---------+-----------+----------+--------------+  POP      Full           Yes      Yes                                 +---------+---------------+---------+-----------+----------+--------------+ PTV      Full           Yes      Yes                                 +---------+---------------+---------+-----------+----------+--------------+ PERO     Full           Yes      Yes                                 +---------+---------------+---------+-----------+----------+--------------+ Gastroc  Full           Yes      Yes                                 +---------+---------------+---------+-----------+----------+--------------+ GSV      Full           Yes      Yes                                 +---------+---------------+---------+-----------+----------+--------------+ SSV      Full           Yes      Yes                                 +---------+---------------+---------+-----------+----------+--------------+   +---------+---------------+---------+-----------+----------+--------------+ LEFT     CompressibilityPhasicitySpontaneityPropertiesThrombus Aging +---------+---------------+---------+-----------+----------+--------------+ CFV      Full           Yes      Yes                                 +---------+---------------+---------+-----------+----------+--------------+ SFJ      Full           Yes      Yes                                  +---------+---------------+---------+-----------+----------+--------------+ FV Prox  Full           Yes      Yes                                 +---------+---------------+---------+-----------+----------+--------------+ FV Mid   Full           Yes      Yes                                 +---------+---------------+---------+-----------+----------+--------------+ FV DistalFull           Yes      Yes                                 +---------+---------------+---------+-----------+----------+--------------+  PFV      Full           Yes      Yes                                 +---------+---------------+---------+-----------+----------+--------------+ POP      Full           Yes      Yes                                 +---------+---------------+---------+-----------+----------+--------------+ PTV      Full           Yes      Yes                                 +---------+---------------+---------+-----------+----------+--------------+ PERO     Full           Yes      Yes                                 +---------+---------------+---------+-----------+----------+--------------+ Gastroc  Full           Yes      Yes                                 +---------+---------------+---------+-----------+----------+--------------+ GSV      Full           Yes      Yes                                 +---------+---------------+---------+-----------+----------+--------------+ SSV      Full           Yes      Yes                                 +---------+---------------+---------+-----------+----------+--------------+    Findings reported to [place] at 1:45.  Summary: BILATERAL: - No evidence of deep vein thrombosis seen in the lower extremities, bilaterally. - No evidence of superficial venous thrombosis in the lower extremities, bilaterally. -   *See table(s) above for measurements and observations. Electronically signed by Orlie Pollen on 06/11/2022 at 5:02:40 PM.     Final      Assessment & Plan:   Lee Lee was seen today for anemia and hypertension.  Diagnoses and all orders for this visit:  Essential hypertension- His blood pressure is adequately well controlled. 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 -     CBC with Differential/Platelet; Future -     CBC with Differential/Platelet  Restless leg syndrome, familial- Will increase the dose of Requip. -     rOPINIRole (REQUIP) 1 MG tablet; Take 1 tablet (1 mg total) by mouth at bedtime.  Bilateral leg edema- Testing is negative for DVT but I am concerned he may have venous reflux.  I have asked him to see vascular surgery. -     VAS [place] LOWER EXTREMITY VENOUS (DVT); Future -     Brain natriuretic peptide; Future -     D-dimer, quantitative; Future -     D-dimer, quantitative -     Brain natriuretic peptide  PAD (peripheral artery disease) ([place])- ABIs showed noncompressible arteries.  I have asked him to see vascular surgery to see if there are any treatment options for this. -     Cancel: VAS [place] ABI WITH/WO TBI; Future -     Cancel: VAS [place] LE ART SEG MULTI (Segm&LE Reynauds); Future -     VAS [place] ABI WITH/WO TBI; Future   I have discontinued Yitzchok Carriger. Bowler's rOPINIRole. I am also having him start on rOPINIRole. Additionally, I am having him maintain his b complex vitamins, midodrine, traZODone, naltrexone, thiamine, desvenlafaxine, carvedilol, indapamide, and busPIRone.  Meds ordered this encounter  Medications   rOPINIRole (REQUIP) 1 MG tablet    Sig: Take 1 tablet (1 mg total) by mouth at bedtime.    Dispense:  90 tablet    Refill:  1   I spent 50 minutes in preparing to see the patient by review of recent labs and imaging, obtaining and reviewing separately obtained history, communicating with the patient , ordering medications, labs  and procedures, and documenting clinical information in the EHR including the differential Dx, treatment, and any further evaluation and management of multiple complex medical issues.     Follow-up: Return in about 3 months (around 09/10/2022).  Scarlette Calico, MD

## 2022-06-10 NOTE — Patient Instructions (Signed)
Peripheral Edema  Peripheral edema is swelling that is caused by a buildup of fluid. Peripheral edema most often affects the lower legs, ankles, and feet. It can also develop in the arms, hands, and face. The area of the body that has peripheral edema will look swollen. It may also feel heavy or warm. Your clothes may start to feel tight. Pressing on the area may make a temporary dent in your skin (pitting edema). You may not be able to move your swollen arm or leg as much as usual. There are many causes of peripheral edema. It can happen because of a complication of other conditions such as heart failure, kidney disease, or a problem with your circulation. It also can be a side effect of certain medicines or happen because of an infection. It often happens to women during pregnancy. Sometimes, the cause is not known. Follow these instructions at home: Managing pain, stiffness, and swelling  Raise (elevate) your legs while you are sitting or lying down. Move around often to prevent stiffness and to reduce swelling. Do not sit or stand for long periods of time. Do not wear tight clothing. Do not wear garters on your upper legs. Exercise your legs to get your circulation going. This helps to move the fluid back into your blood vessels, and it may help the swelling go down. Wear compression stockings as told by your health care provider. These stockings help to prevent blood clots and reduce swelling in your legs. It is important that these are the correct size. These stockings should be prescribed by your doctor to prevent possible injuries. If elastic bandages or wraps are recommended, use them as told by your health care provider. Medicines Take over-the-counter and prescription medicines only as told by your health care provider. Your health care provider may prescribe medicine to help your body get rid of excess water (diuretic). Take this medicine if you are told to take it. General  instructions Eat a low-salt (low-sodium) diet as told by your health care provider. Sometimes, eating less salt may reduce swelling. Pay attention to any changes in your symptoms. Moisturize your skin daily to help prevent skin from cracking and draining. Keep all follow-up visits. This is important. Contact a health care provider if: You have a fever. You have swelling in only one leg. You have increased swelling, redness, or pain in one or both of your legs. You have drainage or sores at the area where you have edema. Get help right away if: You have edema that starts suddenly or is getting worse, especially if you are pregnant or have a medical condition. You develop shortness of breath, especially when you are lying down. You have pain in your chest or abdomen. You feel weak. You feel like you will faint. These symptoms may be an emergency. Get help right away. Call 911. Do not wait to see if the symptoms will go away. Do not drive yourself to the hospital. Summary Peripheral edema is swelling that is caused by a buildup of fluid. Peripheral edema most often affects the lower legs, ankles, and feet. Move around often to prevent stiffness and to reduce swelling. Do not sit or stand for long periods of time. Pay attention to any changes in your symptoms. Contact a health care provider if you have edema that starts suddenly or is getting worse, especially if you are pregnant or have a medical condition. Get help right away if you develop shortness of breath, especially when lying down.  This information is not intended to replace advice given to you by your health care provider. Make sure you discuss any questions you have with your health care provider. Document Revised: 07/20/2021 Document Reviewed: 07/20/2021 Elsevier Patient Education  [place].

## 2022-06-11 ENCOUNTER — Ambulatory Visit (HOSPITAL_COMMUNITY)
Admission: RE | Admit: 2022-06-11 | Discharge: 2022-06-11 | Disposition: A | Discharge status: Home / Self Care | Payer: PPO | Source: Ambulatory Visit | Attending: Internal Medicine | Admitting: Internal Medicine

## 2022-06-11 DIAGNOSIS — I739 Peripheral vascular disease, unspecified: Secondary | ICD-10-CM | POA: Diagnosis not present

## 2022-06-16 ENCOUNTER — Ambulatory Visit (HOSPITAL_COMMUNITY): Admission: RE | Admit: 2022-06-16 | Payer: PPO | Source: Ambulatory Visit

## 2022-06-16 DIAGNOSIS — M545 Low back pain, unspecified: Secondary | ICD-10-CM | POA: Diagnosis not present

## 2022-07-01 DIAGNOSIS — M545 Low back pain, unspecified: Secondary | ICD-10-CM | POA: Diagnosis not present

## 2022-07-12 ENCOUNTER — Encounter: Payer: Self-pay | Admitting: Neurology

## 2022-07-12 ENCOUNTER — Ambulatory Visit: Payer: PPO | Admitting: Neurology

## 2022-07-12 VITALS — BP 118/69 | HR 64 | Ht 71.0 in | Wt 237.0 lb

## 2022-07-12 DIAGNOSIS — G8929 Other chronic pain: Secondary | ICD-10-CM

## 2022-07-12 DIAGNOSIS — M545 Low back pain, unspecified: Secondary | ICD-10-CM | POA: Diagnosis not present

## 2022-07-12 DIAGNOSIS — R202 Paresthesia of skin: Secondary | ICD-10-CM | POA: Diagnosis not present

## 2022-07-12 DIAGNOSIS — R200 Anesthesia of skin: Secondary | ICD-10-CM | POA: Diagnosis not present

## 2022-07-12 DIAGNOSIS — M4807 Spinal stenosis, lumbosacral region: Secondary | ICD-10-CM

## 2022-07-12 NOTE — Progress Notes (Addendum)
[facility]    Provider:  Dr Jaynee Eagles Requesting Provider: Janith Lima, MD Primary Care Provider:  Janith Lima, MD  CC: peripheral neuropathy  HPI:  Lee Lee is a 71 y.o. male here as requested by Janith Lima, MD for tingling of both feet. Here with his wife who also provides information. PMHx oral artery disease, bilateral leg edema, hypertension, deficiency anemia, alcohol abuse, thiamine deficiency, major depression, remote B12 deficiency, osteoarthritis, insomnia, hyperlipidemia, hypertriglyceridemia, erectile dysfunction.  Patient is here with his wife who provides much information.  Started in January just In the right foot in the pad and he went to a podiatrist and a shot of cortizone did not help then started in left foot as well, the whole bottom of each foot is involved including the heel. He has low back and restless legs he is on ropinerole and that initially helped but its starting to get worse. Tingling and numbness on the bottom of the feet, getting worse progressively slowly worse. The podiatrist gave him gabapentin and lyrica but doesn't help. His mother had restles legs. He had low iron and he had infusions but did not help with restless legs. No shooting pains in low back but he has stiffness and achiness and going to therapy once a week he plays a lot of golf. Still not getting better, he tries to walk a lot, he was drinking wine a bottle a day stopped 8 months ago but at that time he completely didn't have numbness in his feet.   Reviewed notes, labs and imaging from outside physicians, which showed   I reviewed Dr. Ronnald Ramp notes, he has numbness tingling and edema in his lower legs, he has a referral to vascular for peripheral artery disease, he denies claudication chest pain or shortness of breath, he does have weight gain, he is abstaining from alcohol, he has worsening restless leg symptoms and leg pain.  - Reviewed recent labs: 04/29/2022  hgba1c 4.5 B1 843 Folate nml 04/13/22 tsh nm, B12 was elevated but it was deficient to 211 in 2021 which may definitely be a contribution. Ferritin was 207  Review of Systems: Patient complains of symptoms per HPI as well as the following symptoms paresthesias. Pertinent negatives and positives per HPI. All others negative.   Social History   Socioeconomic History   Marital status: Married    Spouse name: Not on file   Number of children: 2   Years of education: Not on file   Highest education level: Not on file  Occupational History   Occupation: Retired  Tobacco Use   Smoking status: Former    Types: Cigars    Quit date: 2002    Years since quitting: 21.6    Passive exposure: Past   Smokeless tobacco: Current    Types: Chew   Tobacco comments:    chews tobacco when playing golf only  Vaping Use   Vaping Use: Never used  Substance and Sexual Activity   Alcohol use: Not Currently   Drug use: No   Sexual activity: Yes    Partners: Female  Other Topics Concern   Not on file  Social History Narrative   Not on file   Social Determinants of Health   Financial Resource Strain: [place]  (09/11/2021)   Overall Financial Resource Strain (CARDIA)    Difficulty of Paying Living Expenses: Not hard at all  Food Insecurity: No Food Insecurity (09/11/2021)   Hunger Vital Sign  Worried About Charity fundraiser in the Last Year: Never true    [place] in the Last Year: Never true  Transportation Needs: No Transportation Needs (09/11/2021)   PRAPARE - Hydrologist (Medical): No    Lack of Transportation (Non-Medical): No  Physical Activity: Sufficiently Active (09/11/2021)   Exercise Vital Sign    Days of Exercise per Week: 5 days    Minutes of Exercise per Session: 30 min  Stress: No Stress Concern Present (09/11/2021)   [place]    Feeling of Stress : Not at all   Social Connections: [place] (09/11/2021)   Social Connection and Isolation Panel [NHANES]    Frequency of Communication with Friends and Family: More than three times a week    Frequency of Social Gatherings with Friends and Family: More than three times a week    Attends Religious Services: 1 to 4 times per year    Active Member of Clubs or Organizations: Yes    Attends Archivist Meetings: More than 4 times per year    Marital Status: Married  Human resources officer Violence: Not At Risk (09/11/2021)   Humiliation, Afraid, Rape, and Kick questionnaire    Fear of Current or Ex-Partner: No    Emotionally Abused: No    Physically Abused: No    Sexually Abused: No    Family History  Problem Relation Age of Onset   Valvular heart disease Mother    Lymphoma Mother    Restless legs syndrome Mother     Past Medical History:  Diagnosis Date   Alcohol abuse, episodic drinking behavior    Anxiety    Arthritis    back & knees   Atrial flutter ([place])    s/p RFCA 01/05/13   Calcium oxalate renal stones    Complication of anesthesia    makes him loopy   Depression    ED (erectile dysfunction)    Hemorrhoids    Hypertension     Patient Active Problem List   Diagnosis Date Noted   PAD (peripheral artery disease) ([place]) 06/10/2022   Bilateral leg edema 04/29/2022   Hypotension 04/29/2022   PSA elevation 04/29/2022   Tingling of both feet 04/15/2022   Deficiency anemia 02/01/2022   Alcohol abuse 10/01/2021   Thiamine deficiency 06/01/2021   Encounter for general adult medical examination with abnormal findings 12/31/2020   Steatohepatitis 06/18/2020   Severe episode of recurrent major depressive disorder, without psychotic features ([place]) 06/18/2020   Abnormal electrocardiogram (ECG) (EKG) 06/18/2020   B12 deficiency 12/24/2019   Thoracic aortic aneurysm without rupture ([place]) 12/27/2018   Seasonal allergic rhinitis due to pollen 12/27/2018   Benign prostatic  hyperplasia without lower urinary tract symptoms 12/26/2018   Unilateral primary osteoarthritis, left knee 03/08/2018   Insomnia due to anxiety and fear 09/30/2016   Restless leg syndrome, familial 09/30/2016   Iron deficiency anemia 09/30/2016   Hypertriglyceridemia 06/15/2016   Hyperlipidemia with target LDL less than 130 06/15/2016   Hyperglycemia 06/15/2016   S/P radiofrequency ablation operation for arrhythmia 07/04/15 07/05/2015   SVT (supraventricular tachycardia) ([place]) 07/04/2015   Melanoma of skin ([place]) 04/16/2015   Atrial flutter ([place]) 12/07/2012   Essential hypertension 11/07/2012   Ascending aortic aneurysm ([place]) 11/07/2012   ED (erectile dysfunction) 11/15/2011    Past Surgical History:  Procedure Laterality Date   ABLATION OF DYSRHYTHMIC FOCUS  01/05/2013   ARTHROSCOPIC REPAIR ACL  ATRIAL FLUTTER ABLATION N/A 01/05/2013   Procedure: ATRIAL FLUTTER ABLATION;  Surgeon: Evans Lance, MD;  Location: [facility] CATH LAB;  Service: Cardiovascular;  Laterality: N/A;   COLONOSCOPY  11/2009   Dr. Benson Norway   ELECTROPHYSIOLOGIC STUDY N/A 07/04/2015   Procedure: A-Flutter Ablation;  Surgeon: Evans Lance, MD;  Location: [facility] CV LAB;  Service: Cardiovascular;  Laterality: N/A;   ROTATOR CUFF REPAIR     TOTAL KNEE ARTHROPLASTY Left 10/30/2019   Procedure: TOTAL KNEE ARTHROPLASTY;  Surgeon: Paralee Cancel, MD;  Location: [facility] ORS;  Service: Orthopedics;  Laterality: Left;  70 mins    Current Outpatient Medications  Medication Sig Dispense Refill   b complex vitamins capsule Take 1 capsule by mouth daily.     busPIRone (BUSPAR) 5 MG tablet TAKE ONE TABLET BY MOUTH THREE TIMES A DAY 270 tablet 0   carvedilol (COREG) 3.125 MG tablet Take 1 tablet (3.125 mg total) by mouth 2 (two) times daily with a meal. 180 tablet 0   desvenlafaxine (PRISTIQ) 50 MG 24 hr tablet Take 1 tablet (50 mg total) by mouth daily. 90 tablet 0   indapamide (LOZOL) 2.5 MG tablet TAKE ONE TABLET BY MOUTH DAILY 90  tablet 0   naltrexone (DEPADE) 50 MG tablet Take 1 tablet (50 mg total) by mouth daily. 90 tablet 1   rOPINIRole (REQUIP) 1 MG tablet Take 1 tablet (1 mg total) by mouth at bedtime. 90 tablet 1   thiamine 100 MG tablet Take 1 tablet (100 mg total) by mouth daily. 90 tablet 0   traZODone (DESYREL) 150 MG tablet Take 1 tablet (150 mg total) by mouth at bedtime as needed for sleep. 90 tablet 1   No current facility-administered medications for this visit.    Allergies as of 07/12/2022   (No Known Allergies)    Vitals: BP 118/69   Pulse 64   Ht _0  (1.803 m)   Wt 237 lb (107.5 kg)   BMI 33.05 kg/m  Last Weight:  Wt Readings from Last 1 Encounters:  07/12/22 237 lb (107.5 kg)   Last Height:   Ht Readings from Last 1 Encounters:  07/12/22 _1  (1.803 m)     Physical exam: Exam: Gen: NAD, conversant, well nourised, obese, well groomed                     CV: RRR, no MRG. No Carotid Bruits. No peripheral edema, warm, nontender Eyes: Conjunctivae clear without exudates or hemorrhage  Neuro: Detailed Neurologic Exam  Speech:    Speech is normal; fluent and spontaneous with normal comprehension.  Cognition:    The patient is oriented to person, place, and time;     recent and remote memory intact;     language fluent;     normal attention, concentration,     fund of knowledge Cranial Nerves:    The pupils are equal, round, and reactive to light.  Attempted pupils too small to visualize fundi. Visual fields are full to finger confrontation. Extraocular movements are intact. Trigeminal sensation is intact and the muscles of mastication are normal. The face is symmetric. The palate elevates in the midline. Hearing intact. Voice is normal. Shoulder shrug is normal. The tongue has normal motion without fasciculations.   Coordination:    Normal finger to nose and heel to shin. Normal rapid alternating movements.   Gait:    Slightly antalgic  Motor Observation:    No  asymmetry, no atrophy, and  no involuntary movements noted. Tone:    Normal muscle tone.    Posture:    Slightly stooped ans slightly antalgic     Strength:    Strength is V/V in the upper and lower limbs.      Sensation: intact to LT, vibration, proprioception, 3 seconds vibration     Reflex Exam:  DTR's:    Deep tendon reflexes in the upper and lower extremities are normal bilaterally.   Toes:    The toes are downgoing bilaterally.   Clonus:    Absent AJs. Trace patellars. Normal biceps    Assessment/Plan: 71 y.o. male here as requested by Janith Lima, MD for tingling of both feet. Here with his wife who also provides information. PMHx oral artery disease, bilateral leg edema, hypertension, deficiency anemia, alcohol abuse, thiamine deficiency, major depression, remote B12 deficiency, osteoarthritis, insomnia, hyperlipidemia, hypertriglyceridemia, erectile dysfunction.  He has numbness and tingling on the bottom of his feet.  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    - His risk factors for peripheral polyneuropathy likely small fiber include remote B12 deficiency, history of alcohol abuse (was drinking a bottle of wine a day stopped 8 months ago), peripheral artery disease, low back pain, central obesity.  - Seeing Dr. Jones Skene on [facility], he has distal rubor in his legs with shiny skin and loss of hair, peripheral artery or vascular disease could be an etiology of his numbness and tingling as well as his restless legs.  I will send my note to Dr. Leland Johns he has an appointment tomorrow.  -Blood work today - MRI lumbar spine for spinal stenosis -Vascular problems? Seeing vascular - If negative and vascular negative can consider the following Genetic panel, emg/ncs, skin biopsy - Reviewed recent labs: 04/29/2022 hgba1c 4.5 B1 843 Folate nml 04/13/22 tsh nm, B12 was elevated but it was deficient to 211 in 2021 which may definitely be a  contribution. Ferritin was 207   Orders Placed This Encounter  Procedures   MR LUMBAR SPINE WO CONTRAST   ANA Comprehensive Panel   Vitamin B6   Heavy metals, blood   Multiple Myeloma Panel (SPEP&IFE w/QIG)   Angiotensin converting enzyme   Sedimentation rate   Sjogren's syndrome antibods(ssa + ssb)   RPR   Hepatitis C antibody   Anti-Hu, Anti-Ri, Anti-Yo IFA   Tissue transglutaminase, IgA   Rheumatoid factor   Gliadin antibodies, serum   Vitamin E   No orders of the defined types were placed in this encounter.   Cc: Janith Lima, MD,  Janith Lima, MD  Sarina Ill, MD  [facility] [address] [place], [place] [address]  Phone [phone] Fax [phone]

## 2022-07-12 NOTE — Patient Instructions (Addendum)
Blood work today Vascular problems? Seeing vascular If negative and vascular negative:  Genetic panels Next steps : emg/ncs, skin biopsy Considerations include mri lumbar spine Mri lumbar spine  Peripheral Neuropathy Peripheral neuropathy is a type of nerve damage. It affects nerves that carry signals between the spinal cord and the arms, legs, and the rest of the body (peripheral nerves). It does not affect nerves in the spinal cord or brain. In peripheral neuropathy, one nerve or a group of nerves may be damaged. Peripheral neuropathy is a broad category that includes many specific nerve disorders, like diabetic neuropathy, hereditary neuropathy, and carpal tunnel syndrome. What are the causes? This condition may be caused by: Certain diseases, such as: Diabetes. This is the most common cause of peripheral neuropathy. Autoimmune diseases, such as rheumatoid arthritis and systemic lupus erythematosus. Nerve diseases that are passed from parent to child (inherited). Kidney disease. Thyroid disease. Other causes may include: Nerve injury. Pressure or stress on a nerve that lasts a long time. Lack (deficiency) of B vitamins. This can result from alcoholism, poor diet, or a restricted diet. Infections. Some medicines, such as cancer medicines (chemotherapy). Poisonous (toxic) substances, such as lead and mercury. Too little blood flowing to the legs. Central obesity In some cases, the cause of this condition is not known. What are the signs or symptoms? Symptoms of this condition depend on which of your nerves is damaged. Symptoms in the legs, hands, and arms can include: Loss of feeling (numbness) in the feet, hands, or both. Tingling in the feet, hands, or both. Burning pain. Very sensitive skin. Weakness. Not being able to move a part of the body (paralysis). Clumsiness or poor coordination. Muscle twitching. Loss of balance. Symptoms in other parts of the body can  include: Not being able to control your bladder. Feeling dizzy. Sexual problems. How is this diagnosed? Diagnosing and finding the cause of peripheral neuropathy can be difficult. Your health care provider will take your medical history and do a physical exam. A neurological exam will also be done. This involves checking things that are affected by your brain, spinal cord, and nerves (nervous system). For example, your health care provider will check your reflexes, how you move, and what you can feel. You may have other tests, such as: Blood tests. Electromyogram (EMG) and nerve conduction tests. These tests check nerve function and how well the nerves are controlling the muscles. Imaging tests, such as a CT scan or MRI, to rule out other causes of your symptoms. Removing a small piece of nerve to be examined in a lab (nerve biopsy). Removing and examining a small amount of the fluid that surrounds the brain and spinal cord (lumbar puncture). How is this treated? Treatment for this condition may involve: Treating the underlying cause of the neuropathy, such as diabetes, kidney disease, or vitamin deficiencies. Stopping medicines that can cause neuropathy, such as chemotherapy. Medicine to help relieve pain. Medicines may include: Prescription or over-the-counter pain medicine. Anti-seizure medicine. Antidepressants. Pain-relieving patches that are applied to painful areas of skin. Surgery to relieve pressure on a nerve or to destroy a nerve that is causing pain. Physical therapy to help improve movement and balance. Devices to help you move around (assistive devices). Follow these instructions at home: Medicines Take over-the-counter and prescription medicines only as told by your health care provider. Do not take any other medicines without first asking your health care provider. Ask your health care provider if the medicine prescribed to you requires you  to avoid driving or using  machinery. Lifestyle  Do not use any products that contain nicotine or tobacco. These products include cigarettes, chewing tobacco, and vaping devices, such as e-cigarettes. Smoking keeps blood from reaching damaged nerves. If you need help quitting, ask your health care provider. Avoid or limit alcohol. Too much alcohol can cause a vitamin B deficiency, and vitamin B is needed for healthy nerves. Eat a healthy diet. This includes: Eating foods that are high in fiber, such as beans, whole grains, and fresh fruits and vegetables. Limiting foods that are high in fat and processed sugars, such as fried or sweet foods. General instructions  If you have diabetes, work closely with your health care provider to keep your blood sugar under control. If you have numbness in your feet: Check every day for signs of injury or infection. Watch for redness, warmth, and swelling. Wear padded socks and comfortable shoes. These help protect your feet. Develop a good support system. Living with peripheral neuropathy can be stressful. Consider talking with a mental health specialist or joining a support group. Use assistive devices and attend physical therapy as told by your health care provider. This may include using a walker or a cane. Keep all follow-up visits. This is important. Where to find more information [employer] of Neurological Disorders: [employer] Contact a health care provider if: You have new signs or symptoms of peripheral neuropathy. You are struggling emotionally from dealing with peripheral neuropathy. Your pain is not well controlled. Get help right away if: You have an injury or infection that is not healing normally. You develop new weakness in an arm or leg. You have fallen or do so frequently. Summary Peripheral neuropathy is when the nerves in the arms or legs are damaged, resulting in numbness, weakness, or pain. There are many causes of peripheral neuropathy,  including diabetes, pinched nerves, vitamin deficiencies, autoimmune disease, and hereditary conditions. Diagnosing and finding the cause of peripheral neuropathy can be difficult. Your health care provider will take your medical history, do a physical exam, and do tests, including blood tests and nerve function tests. Treatment involves treating the underlying cause of the neuropathy and taking medicines to help control pain. Physical therapy and assistive devices may also help. This information is not intended to replace advice given to you by your health care provider. Make sure you discuss any questions you have with your health care provider. Document Revised: 07/21/2021 Document Reviewed: 07/21/2021 Elsevier Patient Education  [place].

## 2022-07-12 NOTE — Addendum Note (Signed)
Addended by: Sarina Ill B on: 07/12/2022 06:39 PM   Modules accepted: Orders

## 2022-07-13 ENCOUNTER — Encounter: Payer: Self-pay | Admitting: Neurology

## 2022-07-13 ENCOUNTER — Telehealth: Payer: Self-pay | Admitting: Neurology

## 2022-07-13 DIAGNOSIS — M545 Low back pain, unspecified: Secondary | ICD-10-CM | POA: Diagnosis not present

## 2022-07-13 DIAGNOSIS — L819 Disorder of pigmentation, unspecified: Secondary | ICD-10-CM | POA: Diagnosis not present

## 2022-07-13 DIAGNOSIS — G2581 Restless legs syndrome: Secondary | ICD-10-CM | POA: Diagnosis not present

## 2022-07-13 DIAGNOSIS — R600 Localized edema: Secondary | ICD-10-CM | POA: Diagnosis not present

## 2022-07-13 NOTE — Telephone Encounter (Signed)
HTA order sent to GI, [facility] they will reach out to the patient to schedule.

## 2022-07-14 ENCOUNTER — Other Ambulatory Visit: Payer: Self-pay | Admitting: Internal Medicine

## 2022-07-14 DIAGNOSIS — G2581 Restless legs syndrome: Secondary | ICD-10-CM

## 2022-07-14 MED ORDER — ROPINIROLE HCL 2 MG PO TABS: 2.0000 mg | ORAL_TABLET | Freq: Every day | ORAL | 1 refills | Status: DC

## 2022-07-15 LAB — ANA COMPREHENSIVE PANEL
Anti JO-1: <0.2 AI (ref 0.0–0.9)
Centromere Ab Screen: <0.2 AI (ref 0.0–0.9)
Chromatin Ab SerPl-aCnc: <0.2 AI (ref 0.0–0.9)
ENA RNP Ab: 2.7 AI — ABNORMAL HIGH (ref 0.0–0.9)
ENA SM Ab Ser-aCnc: <0.2 AI (ref 0.0–0.9)
ENA SSA (RO) Ab: 0.5 AI (ref 0.0–0.9)
ENA SSB (LA) Ab: <0.2 AI (ref 0.0–0.9)
Scleroderma (Scl-70) (ENA) Antibody, IgG: <0.2 AI (ref 0.0–0.9)
dsDNA Ab: 1 IU/mL (ref 0–9)

## 2022-07-15 LAB — MULTIPLE MYELOMA PANEL, SERUM
Albumin SerPl Elph-Mcnc: 3.7 g/dL (ref 2.9–4.4)
Albumin/Glob SerPl: 1.3 (ref 0.7–1.7)
Alpha 1: 0.2 g/dL (ref 0.0–0.4)
Alpha2 Glob SerPl Elph-Mcnc: 0.6 g/dL (ref 0.4–1.0)
B-Globulin SerPl Elph-Mcnc: 0.9 g/dL (ref 0.7–1.3)
Gamma Glob SerPl Elph-Mcnc: 1.2 g/dL (ref 0.4–1.8)
Globulin, Total: 2.9 g/dL (ref 2.2–3.9)
IgA/Immunoglobulin A, Serum: 370 mg/dL (ref 61–437)
IgG (Immunoglobin G), Serum: 1168 mg/dL (ref 603–1613)
IgM (Immunoglobulin M), Srm: 141 mg/dL (ref 20–172)
Total Protein: 6.6 g/dL (ref 6.0–8.5)

## 2022-07-15 LAB — ANTI-HU, ANTI-RI, ANTI-YO IFA
Anti-Hu Ab: NEGATIVE
Anti-Ri Ab: NEGATIVE
Anti-Yo Ab: NEGATIVE

## 2022-07-15 LAB — RPR: RPR Ser Ql: NONREACTIVE

## 2022-07-15 LAB — GLIADIN ANTIBODIES, SERUM
Antigliadin Abs, IgA: 11 units (ref 0–19)
Gliadin IgG: 2 units (ref 0–19)

## 2022-07-15 LAB — VITAMIN E
Vitamin E (Alpha Tocopherol): 8.1 mg/L — ABNORMAL LOW (ref 9.0–29.0)
Vitamin E(Gamma Tocopherol): 0.8 mg/L (ref 0.5–4.9)

## 2022-07-15 LAB — HEAVY METALS, BLOOD
Arsenic: 2 ug/L (ref 0–9)
Lead, Blood: <1 ug/dL (ref 0.0–3.4)
Mercury: 1.4 ug/L (ref 0.0–14.9)

## 2022-07-15 LAB — HEPATITIS C ANTIBODY: Hep C Virus Ab: NONREACTIVE

## 2022-07-15 LAB — VITAMIN B6: Vitamin B6: 40.7 ug/L (ref 3.4–65.2)

## 2022-07-15 LAB — TISSUE TRANSGLUTAMINASE, IGA: Transglutaminase IgA: <2 U/mL (ref 0–3)

## 2022-07-15 LAB — ANGIOTENSIN CONVERTING ENZYME: Angio Convert Enzyme: 54 U/L (ref 14–82)

## 2022-07-15 LAB — SEDIMENTATION RATE: Sed Rate: 11 mm/hr (ref 0–30)

## 2022-07-15 LAB — RHEUMATOID FACTOR: Rheumatoid fact SerPl-aCnc: <10 IU/mL (ref <14.0)

## 2022-07-20 ENCOUNTER — Ambulatory Visit
Admission: RE | Admit: 2022-07-20 | Discharge: 2022-07-20 | Disposition: A | Discharge status: Home / Self Care | Payer: PPO | Source: Ambulatory Visit | Attending: Neurology | Admitting: Neurology

## 2022-07-20 DIAGNOSIS — G8929 Other chronic pain: Secondary | ICD-10-CM

## 2022-07-20 DIAGNOSIS — M545 Low back pain, unspecified: Secondary | ICD-10-CM | POA: Diagnosis not present

## 2022-07-20 DIAGNOSIS — R200 Anesthesia of skin: Secondary | ICD-10-CM | POA: Diagnosis not present

## 2022-07-20 DIAGNOSIS — M4807 Spinal stenosis, lumbosacral region: Secondary | ICD-10-CM | POA: Diagnosis not present

## 2022-07-20 DIAGNOSIS — M48061 Spinal stenosis, lumbar region without neurogenic claudication: Secondary | ICD-10-CM | POA: Diagnosis not present

## 2022-07-21 DIAGNOSIS — M545 Low back pain, unspecified: Secondary | ICD-10-CM | POA: Diagnosis not present

## 2022-07-27 ENCOUNTER — Encounter: Payer: Self-pay | Admitting: Neurology

## 2022-07-29 DIAGNOSIS — M545 Low back pain, unspecified: Secondary | ICD-10-CM | POA: Diagnosis not present

## 2022-08-04 DIAGNOSIS — M545 Low back pain, unspecified: Secondary | ICD-10-CM | POA: Diagnosis not present

## 2022-08-08 ENCOUNTER — Encounter: Payer: Self-pay | Admitting: Neurology

## 2022-08-09 DIAGNOSIS — I83893 Varicose veins of bilateral lower extremities with other complications: Secondary | ICD-10-CM | POA: Diagnosis not present

## 2022-08-11 ENCOUNTER — Ambulatory Visit: Payer: PPO

## 2022-08-11 DIAGNOSIS — I10 Essential (primary) hypertension: Secondary | ICD-10-CM | POA: Diagnosis not present

## 2022-08-11 DIAGNOSIS — I712 Thoracic aortic aneurysm, without rupture, unspecified: Secondary | ICD-10-CM

## 2022-08-11 DIAGNOSIS — M545 Low back pain, unspecified: Secondary | ICD-10-CM | POA: Diagnosis not present

## 2022-08-11 DIAGNOSIS — I4892 Unspecified atrial flutter: Secondary | ICD-10-CM | POA: Diagnosis not present

## 2022-08-15 NOTE — Progress Notes (Signed)
PCV ECHOCARDIOGRAM COMPLETE 08/11/2022  - Narrative - Echocardiogram 08/11/2022: Normal LV systolic function with visual EF 55-60%. Left ventricle cavity is normal in size. Mild concentric hypertrophy of the left ventricle. Normal global wall motion. Normal diastolic filling pattern, normal LAP. Calculated EF 54%. Left atrial cavity is mildly dilated at 4.4 cm. Structurally normal tricuspid valve with no regurgitation. No evidence of pulmonary hypertension. The aortic root is dilated at 4.1 cm. Compared to prior study 07/23/2021, Ao root measured 4.3 cm.

## 2022-08-16 ENCOUNTER — Other Ambulatory Visit: Payer: PPO

## 2022-08-17 ENCOUNTER — Other Ambulatory Visit: Payer: Self-pay | Admitting: Internal Medicine

## 2022-08-20 ENCOUNTER — Other Ambulatory Visit: Payer: Self-pay | Admitting: Internal Medicine

## 2022-08-20 DIAGNOSIS — F411 Generalized anxiety disorder: Secondary | ICD-10-CM

## 2022-08-23 ENCOUNTER — Ambulatory Visit: Payer: PPO | Admitting: Cardiology

## 2022-08-23 ENCOUNTER — Encounter: Payer: Self-pay | Admitting: Cardiology

## 2022-08-23 VITALS — BP 128/80 | HR 62 | Temp 98.3°F | Resp 16 | Ht 71.0 in | Wt 253.6 lb

## 2022-08-23 DIAGNOSIS — I951 Orthostatic hypotension: Secondary | ICD-10-CM

## 2022-08-23 DIAGNOSIS — R0609 Other forms of dyspnea: Secondary | ICD-10-CM

## 2022-08-23 DIAGNOSIS — I77810 Thoracic aortic ectasia: Secondary | ICD-10-CM

## 2022-08-23 DIAGNOSIS — I10 Essential (primary) hypertension: Secondary | ICD-10-CM | POA: Diagnosis not present

## 2022-08-23 DIAGNOSIS — R739 Hyperglycemia, unspecified: Secondary | ICD-10-CM | POA: Diagnosis not present

## 2022-08-23 MED ORDER — LOSARTAN POTASSIUM 25 MG PO TABS: 25.0000 mg | ORAL_TABLET | Freq: Every evening | ORAL | 3 refills | Status: DC

## 2022-08-23 NOTE — Progress Notes (Signed)
Primary Physician/Referring:  Janith Lima, MD  Patient ID: Lee Lee, male    DOB: Jul 06, 1951, 71 y.o.   MRN: [id number]  Chief Complaint  Patient presents with   Orthostatic BP   Follow-up    1 year   HPI:    Lee Lee  is a 70 y.o. 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  Past medical history significant for hypertension, ascending aortic dilatation,, depression since he retired.  He had developed significant depression after he retired, was placed on multiple medications leading to orthostatic hypotension.  He now presents for annual visit.  Except for chronic mild dyspnea which she states is very stable and improved as he has been walking on a daily basis, he has not had any further episodes of dizziness.  .   Past Medical History:  Diagnosis Date   Alcohol abuse, episodic drinking behavior    Anxiety    Arthritis    back & knees   Atrial flutter (HCC)    s/p RFCA 01/05/13   Calcium oxalate renal stones    Complication of anesthesia    makes him loopy   Depression    ED (erectile dysfunction)    Hemorrhoids    Hypertension    Past Surgical History:  Procedure Laterality Date   ABLATION OF DYSRHYTHMIC FOCUS  01/05/2013   ARTHROSCOPIC REPAIR ACL     ATRIAL FLUTTER ABLATION N/A 01/05/2013   Procedure: ATRIAL FLUTTER ABLATION;  Surgeon: Evans Lance, MD;  Location: [facility] C[facility] LAB;  Service: Cardiovascular;  Laterality: N/A;   COLONOSCOPY  11/2009   Dr. Benson Norway   ELECTROPHYSIOLOGIC STUDY N/A 07/04/2015   Procedure: A-Flutter Ablation;  Surgeon: Evans Lance, MD;  Location: [facility];  Service: Cardiovascular;  Laterality: N/A;   ROTATOR CUFF REPAIR     TOTAL KNEE ARTHROPLASTY Left 10/30/2019   Procedure: TOTAL KNEE ARTHROPLASTY;  Surgeon: Paralee Cancel, MD;   Location: [facility] ORS;  Service: Orthopedics;  Laterality: Left;  70 mins   Family History  Problem Relation Age of Onset   Valvular heart disease Mother    Lymphoma Mother    Restless legs syndrome Mother     Social History   Tobacco Use   Smoking status: Former    Types: Cigars    Quit date: 2002    Years since quitting: 21.7    Passive exposure: Past   Smokeless tobacco: Current    Types: Chew   Tobacco comments:    chews tobacco when playing golf only  Substance Use Topics   Alcohol use: Not Currently   Marital Status: Married  ROS  Review of Systems  Cardiovascular:  Positive for dyspnea on exertion. Negative for chest pain and leg swelling.   Objective  Blood pressure 128/80, pulse 62, temperature 98.3 F (36.8 C), temperature source Temporal, resp. rate 16, height 5' 11"  (1.803 m), weight 253 lb 9.6 oz (115 kg), SpO2 98 %. Body mass index is 35.37 kg/m.     08/23/2022    8:26 AM 07/12/2022    7:40 AM 06/10/2022    7:57 AM  Vitals with BMI  Height 5' 11"  5' 11"  5' 11"   Weight 253 lbs 10 oz 237 lbs 244 lbs  BMI 35.39 41.74 08.14  Systolic 481 856 314  Diastolic 80 69 76  Pulse 62 64 58    Orthostatic VS for the past 72 hrs (Last 3 readings):  Orthostatic BP Patient Position BP Location Cuff Size Orthostatic Pulse  08/23/22 0841 139/80 Standing Left Arm Large 63  08/23/22 0840 143/81 Sitting Left Arm Large 62  08/23/22 0839 155/88 Supine Left Arm Large 63   07/17/21 0926 95/59 Standing Left Arm Large 71  07/17/21 0925 106/75 Sitting Left Arm Large 77  07/17/21 0924 120/80 Supine Left Arm Large 67     Physical Exam Neck:     Vascular: No carotid bruit or JVD.  Cardiovascular:     Rate and Rhythm: Normal rate and regular rhythm.     Pulses: Intact distal pulses.     Heart sounds: Normal heart sounds. No murmur heard.    No gallop.  Pulmonary:     Effort: Pulmonary effort is normal.     Breath sounds: Normal breath sounds.  Abdominal:     General: Bowel  sounds are normal.     Palpations: Abdomen is soft.  Musculoskeletal:     Right lower leg: Edema (2 + ankle pitting) present.     Left lower leg: Edema (2 + ankle pitting) present.      Laboratory examination:   Lab Results  Component Value Date   NA 135 06/10/2022   K 3.9 06/10/2022   CO2 31 06/10/2022   GLUCOSE 212 (H) 06/10/2022   BUN 25 (H) 06/10/2022   CREATININE 0.88 06/10/2022   CALCIUM 9.4 06/10/2022   GFRNONAA >60 12/16/2021    Recent Labs    09/27/21 1603 11/03/21 0917 12/15/21 1119 12/16/21 1819 01/29/22 1128 04/13/22 1426 04/29/22 0855 06/10/22 0823  NA 138   < > 135 135   < > 140 138 135  K 3.5   < > 3.8 4.6   < > 4.1 4.1 3.9  CL 100   < > 97* 102   < > 104 98 98  CO2 24   < > 25 20*   < > 29 31 31   GLUCOSE 116*   < > 82 65*   < > 74 236* 212*  BUN 18   < > 10 8   < > 27* 19 25*  CREATININE 0.87   < > 0.77 0.82   < > 1.04 0.90 0.88  CALCIUM 9.1   < > 9.1 9.0   < > 9.2 9.6 9.4  GFRNONAA >60  --  >60 >60  --   --   --   --    < > = values in this interval not displayed.   Lab Results  Component Value Date   ALT 17 04/13/2022   AST 23 04/13/2022   ALKPHOS 55 04/13/2022   BILITOT 0.4 04/13/2022    Lab Results  Component Value Date   WBC 6.1 06/10/2022   HGB 12.1 (L) 06/10/2022   HCT 35.4 (L) 06/10/2022   MCV 98.6 06/10/2022   PLT 172.0 06/10/2022     Lab Results  Component Value Date   CHOL 150 08/17/2021   HDL 73 08/17/2021   LDLCALC 63 08/17/2021   LDLDIRECT 101.0 12/26/2018   TRIG 71 08/17/2021   CHOLHDL 3 12/25/2020    HEMOGLOBIN A1C Lab Results  Component Value Date   HGBA1C 4.5 (L) 04/29/2022   TSH Recent Labs    04/13/22 1426  TSH 0.75   Medications and allergies  No Known Allergies    Medication list after today's encounter  Current Outpatient Medications:    b complex vitamins capsule, Take 1 capsule by mouth daily., Disp: , Rfl:    busPIRone (BUSPAR) 5 MG tablet, TAKE ONE TABLET BY MOUTH THREE TIMES A DAY, Disp:  270 tablet, Rfl: 0   desvenlafaxine (PRISTIQ) 50 MG 24 hr tablet, TAKE 1 TABLET BY MOUTH DAILY, Disp: 90 tablet, Rfl: 0   indapamide (LOZOL) 2.5 MG tablet, TAKE ONE TABLET BY MOUTH DAILY, Disp: 90 tablet, Rfl: 0   losartan (COZAAR) 25 MG tablet, Take 1 tablet (25 mg total) by mouth every evening., Disp: 90 tablet, Rfl: 3   naltrexone (DEPADE) 50 MG tablet, Take 1 tablet (50 mg total) by mouth daily., Disp: 90 tablet, Rfl: 1   rOPINIRole (REQUIP) 2 MG tablet, Take 1 tablet (2 mg total) by mouth at bedtime., Disp: 90 tablet, Rfl: 1   traZODone (DESYREL) 150 MG tablet, Take 1 tablet (150 mg total) by mouth at bedtime as needed for sleep., Disp: 90 tablet, Rfl: 1   Radiology:   CT angiogram of the chest aorta with and without contrast 12/17/2020, compared to 12/28/2019. 1. No acute abnormality. Stable mild dilation of the ascending aorta up to 4.2 cm. Mild calcific atherosclerosis of the aorta. Recommend annual imaging followup by CTA or MRA. This recommendation follows 2010 ACCF/AHA/AATS/ACR/ASA/SCA/SCAI/SIR/STS/SVM Guidelines for the Diagnosis and Management of Patients with Thoracic Aortic Disease. Circulation. 2010; 121: B284-X324. Aortic aneurysm NOS (ICD10-I71.9) 2. Hepatic steatosis.  Cardiac Studies:   Atrial flutter ablation 2014 & 2016 Lee Peru, MD  Treadmill nuclear stress test 07/01/2021: Fair exercise capacity, achieved 7 METS Peak HR 141 bpm, 92% max predicted heart rate. No EKG evidence of ischemia The left ventricular ejection fraction is normal (55-65%). Nuclear stress EF: 57%. The study is normal. This is a low risk study.  No significant change from 07/01/2020.   ABI 06/11/2022: Right: Resting right ankle-brachial index indicates noncompressible right  lower extremity arteries. The right toe-brachial index is normal. Normal  pedal waveforms.   Left: Resting left ankle-brachial index indicates noncompressible left  lower extremity arteries. The left toe-brachial index is  normal. Normal  pedal waveforms.  PCV ECHOCARDIOGRAM COMPLETE 08/11/2022  Narrative Echocardiogram 08/11/2022: Normal LV systolic function with visual EF 55-60%. Left ventricle cavity is normal in size. Mild concentric hypertrophy of the left ventricle. Normal global wall motion. Normal diastolic filling pattern, normal LAP. Calculated EF 54%. Left atrial cavity is mildly dilated at 4.4 cm. Structurally normal tricuspid valve with no regurgitation. No evidence of pulmonary hypertension. The aortic root is dilated at 4.1 cm. Compared to prior study 07/23/2021, Ao root measured 4.3 cm.   EKG:   EKG 08/23/2022: Sinus rhythm with first-degree block at rate of 58 beats minute, left atrial enlargement, incomplete right bundle branch block. No significant change from  EKG 07/17/2021.    Assessment     ICD-10-CM   1. Aortic root dilatation (HCC)  I77.810 EKG 12-Lead    losartan (COZAAR) 25 MG tablet    2. Orthostatic hypotension  M01.0 Basic metabolic panel    3. Supine hypertension  I10     4. Dyspnea on exertion  R06.09     5. Hyperglycemia  R73.9 Hgb A1c w/o eAG      Medications Discontinued During This Encounter  Medication Reason   thiamine 100 MG tablet    carvedilol (COREG) 3.125 MG tablet Change in therapy    Meds ordered this encounter  Medications   losartan (COZAAR) 25 MG tablet  Sig: Take 1 tablet (25 mg total) by mouth every evening.    Dispense:  90 tablet    Refill:  3    Orders Placed This Encounter  Procedures   Basic metabolic panel   Hgb H8I w/o eAG   EKG 12-Lead    Recommendations:   DASHUN BORRE is a 71 y.o. 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  Past medical history significant for hypertension, ascending aortic dilatation,, depression since he retired.  He  had developed significant depression after he retired, was placed on multiple medications leading to orthostatic hypotension.  He now presents for annual visit.  Except for chronic mild dyspnea which she states is very stable and improved as he has been walking on a daily basis, he has not had any further episodes of dizziness.  He remains otherwise asymptomatic except he has developed leg edema since he gained about 20 to 30 pounds in weight over the past 6 months or so.  I suspect his weight gain is related to increased intra-abdominal pressure and venous insufficiency or venous stasis due to moderate obesity.  Weight loss and salt restriction discussed.  Patient is presently on indapamide but has been drinking excessive amounts of fluids which I dissuaded him from doing this.  In view of aortic root dilatation, will discontinue carvedilol and switch him to losartan 25 mg in the evening.  He will obtain BMP and as his blood sugar was in diabetic range although A1c was normal, would like to repeat the BMP in a fasting state along with A1c reputation.  I will see him back in 2 years or sooner if problems.  He is mildly orthostatic today but remains asymptomatic.   Adrian Prows, MD, [facility] 08/23/2022, 9:06 AM Office: [phone]

## 2022-08-25 ENCOUNTER — Other Ambulatory Visit: Payer: Self-pay | Admitting: Internal Medicine

## 2022-08-25 ENCOUNTER — Encounter: Payer: Self-pay | Admitting: Cardiology

## 2022-08-25 DIAGNOSIS — M545 Low back pain, unspecified: Secondary | ICD-10-CM | POA: Diagnosis not present

## 2022-08-26 ENCOUNTER — Other Ambulatory Visit: Payer: Self-pay

## 2022-08-26 DIAGNOSIS — I77810 Thoracic aortic ectasia: Secondary | ICD-10-CM

## 2022-08-26 MED ORDER — LOSARTAN POTASSIUM 25 MG PO TABS: 25.0000 mg | ORAL_TABLET | Freq: Every evening | ORAL | 3 refills | Status: DC

## 2022-08-26 MED ORDER — DESVENLAFAXINE SUCCINATE ER 50 MG PO TB24
50.0000 mg | ORAL_TABLET | Freq: Every day | ORAL | 0 refills | Status: DC
Start: 2022-08-26 — End: 2022-11-30

## 2022-08-30 ENCOUNTER — Encounter: Payer: Self-pay | Admitting: Internal Medicine

## 2022-08-31 ENCOUNTER — Telehealth: Payer: Self-pay

## 2022-08-31 ENCOUNTER — Ambulatory Visit (INDEPENDENT_AMBULATORY_CARE_PROVIDER_SITE_OTHER): Payer: PPO | Admitting: Internal Medicine

## 2022-08-31 ENCOUNTER — Encounter: Payer: Self-pay | Admitting: Internal Medicine

## 2022-08-31 VITALS — BP 130/76 | HR 63 | Temp 97.8°F | Resp 16 | Ht 71.0 in | Wt 247.0 lb

## 2022-08-31 DIAGNOSIS — E519 Thiamine deficiency, unspecified: Secondary | ICD-10-CM | POA: Diagnosis not present

## 2022-08-31 DIAGNOSIS — K7581 Nonalcoholic steatohepatitis (NASH): Secondary | ICD-10-CM

## 2022-08-31 DIAGNOSIS — E118 Type 2 diabetes mellitus with unspecified complications: Secondary | ICD-10-CM

## 2022-08-31 DIAGNOSIS — G2581 Restless legs syndrome: Secondary | ICD-10-CM | POA: Diagnosis not present

## 2022-08-31 DIAGNOSIS — I10 Essential (primary) hypertension: Secondary | ICD-10-CM

## 2022-08-31 DIAGNOSIS — Z23 Encounter for immunization: Secondary | ICD-10-CM

## 2022-08-31 DIAGNOSIS — I872 Venous insufficiency (chronic) (peripheral): Secondary | ICD-10-CM | POA: Diagnosis not present

## 2022-08-31 DIAGNOSIS — R600 Localized edema: Secondary | ICD-10-CM | POA: Diagnosis not present

## 2022-08-31 DIAGNOSIS — E538 Deficiency of other specified B group vitamins: Secondary | ICD-10-CM | POA: Diagnosis not present

## 2022-08-31 DIAGNOSIS — M542 Cervicalgia: Secondary | ICD-10-CM

## 2022-08-31 DIAGNOSIS — D508 Other iron deficiency anemias: Secondary | ICD-10-CM | POA: Diagnosis not present

## 2022-08-31 DIAGNOSIS — Z0001 Encounter for general adult medical examination with abnormal findings: Secondary | ICD-10-CM

## 2022-08-31 DIAGNOSIS — R739 Hyperglycemia, unspecified: Secondary | ICD-10-CM

## 2022-08-31 DIAGNOSIS — L819 Disorder of pigmentation, unspecified: Secondary | ICD-10-CM | POA: Diagnosis not present

## 2022-08-31 DIAGNOSIS — E785 Hyperlipidemia, unspecified: Secondary | ICD-10-CM | POA: Diagnosis not present

## 2022-08-31 LAB — HEPATIC FUNCTION PANEL
ALT: 17 U/L (ref 0–53)
AST: 20 U/L (ref 0–37)
Albumin: 3.9 g/dL (ref 3.5–5.2)
Alkaline Phosphatase: 80 U/L (ref 39–117)
Bilirubin, Direct: 0.1 mg/dL (ref 0.0–0.3)
Total Bilirubin: 0.4 mg/dL (ref 0.2–1.2)
Total Protein: 6.9 g/dL (ref 6.0–8.3)

## 2022-08-31 LAB — LIPID PANEL
Cholesterol: 93 mg/dL (ref 0–200)
HDL: 43.9 mg/dL (ref >39.00)
LDL Cholesterol: 36 mg/dL (ref 0–99)
NonHDL: 49.28
Total CHOL/HDL Ratio: 2
Triglycerides: 66 mg/dL (ref 0.0–149.0)
VLDL: 13.2 mg/dL (ref 0.0–40.0)

## 2022-08-31 LAB — BASIC METABOLIC PANEL
BUN: 21 mg/dL (ref 6–23)
CO2: 29 mEq/L (ref 19–32)
Calcium: 9.2 mg/dL (ref 8.4–10.5)
Chloride: 103 mEq/L (ref 96–112)
Creatinine, Ser: 0.77 mg/dL (ref 0.40–1.50)
GFR: 91.21 mL/min (ref >60.00)
Glucose, Bld: 105 mg/dL — ABNORMAL HIGH (ref 70–99)
Potassium: 4.1 mEq/L (ref 3.5–5.1)
Sodium: 137 mEq/L (ref 135–145)

## 2022-08-31 LAB — CBC WITH DIFFERENTIAL/PLATELET
Basophils Absolute: 0 10*3/uL (ref 0.0–0.1)
Basophils Relative: 0.5 % (ref 0.0–3.0)
Eosinophils Absolute: 0.1 10*3/uL (ref 0.0–0.7)
Eosinophils Relative: 2.2 % (ref 0.0–5.0)
HCT: 34.7 % — ABNORMAL LOW (ref 39.0–52.0)
Hemoglobin: 11.8 g/dL — ABNORMAL LOW (ref 13.0–17.0)
Lymphocytes Relative: 21.6 % (ref 12.0–46.0)
Lymphs Abs: 1.3 10*3/uL (ref 0.7–4.0)
MCHC: 34 g/dL (ref 30.0–36.0)
MCV: 96.4 fl (ref 78.0–100.0)
Monocytes Absolute: 0.7 10*3/uL (ref 0.1–1.0)
Monocytes Relative: 11.2 % (ref 3.0–12.0)
Neutro Abs: 3.9 10*3/uL (ref 1.4–7.7)
Neutrophils Relative %: 64.5 % (ref 43.0–77.0)
Platelets: 173 10*3/uL (ref 150.0–400.0)
RBC: 3.6 Mil/uL — ABNORMAL LOW (ref 4.22–5.81)
RDW: 13.8 % (ref 11.5–15.5)
WBC: 6.1 10*3/uL (ref 4.0–10.5)

## 2022-08-31 LAB — IBC + FERRITIN
Ferritin: 18.7 ng/mL — ABNORMAL LOW (ref 22.0–322.0)
Iron: 129 ug/dL (ref 42–165)
Saturation Ratios: 39.2 % (ref 20.0–50.0)
TIBC: 329 ug/dL (ref 250.0–450.0)
Transferrin: 235 mg/dL (ref 212.0–360.0)

## 2022-08-31 LAB — HEMOGLOBIN A1C: Hgb A1c MFr Bld: 7 % — ABNORMAL HIGH (ref 4.6–6.5)

## 2022-08-31 MED ORDER — CYANOCOBALAMIN 1000 MCG/ML IJ SOLN
1000.0000 ug | Freq: Once | INTRAMUSCULAR | Status: AC
  Administered 2022-08-31: 1000 ug via INTRAMUSCULAR

## 2022-08-31 MED ORDER — TIZANIDINE HCL 2 MG PO TABS: 2.0000 mg | ORAL_TABLET | Freq: Three times a day (TID) | ORAL | 1 refills | Status: DC | PRN

## 2022-08-31 NOTE — Telephone Encounter (Signed)
Patient wants to know what medication Dr.Jones was going to call in for him for the neck pain.

## 2022-08-31 NOTE — Patient Instructions (Signed)
Health Maintenance, Male Adopting a healthy lifestyle and getting preventive care are important in promoting health and wellness. Ask your health care provider about: The right schedule for you to have regular tests and exams. Things you can do on your own to prevent diseases and keep yourself healthy. What should I know about diet, weight, and exercise? Eat a healthy diet  Eat a diet that includes plenty of vegetables, fruits, low-fat dairy products, and lean protein. Do not eat a lot of foods that are high in solid fats, added sugars, or sodium. Maintain a healthy weight Body mass index (BMI) is a measurement that can be used to identify possible weight problems. It estimates body fat based on height and weight. Your health care provider can help determine your BMI and help you achieve or maintain a healthy weight. Get regular exercise Get regular exercise. This is one of the most important things you can do for your health. Most adults should: Exercise for at least 150 minutes each week. The exercise should increase your heart rate and make you sweat (moderate-intensity exercise). Do strengthening exercises at least twice a week. This is in addition to the moderate-intensity exercise. Spend less time sitting. Even light physical activity can be beneficial. Watch cholesterol and blood lipids Have your blood tested for lipids and cholesterol at 69 years of age, then have this test every 5 years. You may need to have your cholesterol levels checked more often if: Your lipid or cholesterol levels are high. You are older than 69 years of age. You are at high risk for heart disease. What should I know about cancer screening? Many types of cancers can be detected early and may often be prevented. Depending on your health history and family history, you may need to have cancer screening at various ages. This may include screening for: Colorectal cancer. Prostate cancer. Skin cancer. Lung  cancer. What should I know about heart disease, diabetes, and high blood pressure? Blood pressure and heart disease High blood pressure causes heart disease and increases the risk of stroke. This is more likely to develop in people who have high blood pressure readings or are overweight. Talk with your health care provider about your target blood pressure readings. Have your blood pressure checked: Every 3-5 years if you are 18-39 years of age. Every year if you are 40 years old or older. If you are between the ages of 65 and 75 and are a current or former smoker, ask your health care provider if you should have a one-time screening for abdominal aortic aneurysm (AAA). Diabetes Have regular diabetes screenings. This checks your fasting blood sugar level. Have the screening done: Once every three years after age 45 if you are at a normal weight and have a low risk for diabetes. More often and at a younger age if you are overweight or have a high risk for diabetes. What should I know about preventing infection? Hepatitis B If you have a higher risk for hepatitis B, you should be screened for this virus. Talk with your health care provider to find out if you are at risk for hepatitis B infection. Hepatitis C Blood testing is recommended for: Everyone born from 1945 through 1965. Anyone with known risk factors for hepatitis C. Sexually transmitted infections (STIs) You should be screened each year for STIs, including gonorrhea and chlamydia, if: You are sexually active and are younger than 69 years of age. You are older than 69 years of age and your   health care provider tells you that you are at risk for this type of infection. Your sexual activity has changed since you were last screened, and you are at increased risk for chlamydia or gonorrhea. Ask your health care provider if you are at risk. Ask your health care provider about whether you are at high risk for HIV. Your health care provider  may recommend a prescription medicine to help prevent HIV infection. If you choose to take medicine to prevent HIV, you should first get tested for HIV. You should then be tested every 3 months for as long as you are taking the medicine. Follow these instructions at home: Alcohol use Do not drink alcohol if your health care provider tells you not to drink. If you drink alcohol: Limit how much you have to 0-2 drinks a day. Know how much alcohol is in your drink. In the [place] one drink equals one 12 oz bottle of beer (355 mL), one 5 oz glass of wine (148 mL), or one 1 oz glass of hard liquor (44 mL). Lifestyle Do not use any products that contain nicotine or tobacco. These products include cigarettes, chewing tobacco, and vaping devices, such as e-cigarettes. If you need help quitting, ask your health care provider. Do not use street drugs. Do not share needles. Ask your health care provider for help if you need support or information about quitting drugs. General instructions Schedule regular health, dental, and eye exams. Stay current with your vaccines. Tell your health care provider if: You often feel depressed. You have ever been abused or do not feel safe at home. Summary Adopting a healthy lifestyle and getting preventive care are important in promoting health and wellness. Follow your health care provider's instructions about healthy diet, exercising, and getting tested or screened for diseases. Follow your health care provider's instructions on monitoring your cholesterol and blood pressure. This information is not intended to replace advice given to you by your health care provider. Make sure you discuss any questions you have with your health care provider. Document Revised: 04/06/2021 Document Reviewed: 04/06/2021 Elsevier Patient Education  2023 [employer]  

## 2022-08-31 NOTE — Progress Notes (Signed)
Subjective:  Patient ID: Lee Lee, male    DOB: 12-Feb-1951  Age: 71 y.o. MRN: [id number]  CC: Annual Exam, Anemia, and Hypertension   HPI Lee Lee presents for a CPX and f/up -   He complains of a 2-day history of sore neck.  He denies trauma or injury.  He has persistent numbness in his fingers.  He is active and denies chest pain or shortness of breath.  He has been told that his lower extremity edema is due to venous insufficiency.  He complains of weight gain.  Outpatient Medications Prior to Visit  Medication Sig Dispense Refill   b complex vitamins capsule Take 1 capsule by mouth daily.     busPIRone (BUSPAR) 5 MG tablet TAKE ONE TABLET BY MOUTH THREE TIMES A DAY 270 tablet 0   desvenlafaxine (PRISTIQ) 50 MG 24 hr tablet Take 1 tablet (50 mg total) by mouth daily. 90 tablet 0   losartan (COZAAR) 25 MG tablet Take 1 tablet (25 mg total) by mouth every evening. 90 tablet 3   rOPINIRole (REQUIP) 2 MG tablet Take 1 tablet (2 mg total) by mouth at bedtime. 90 tablet 1   traZODone (DESYREL) 150 MG tablet Take 1 tablet (150 mg total) by mouth at bedtime as needed for sleep. 90 tablet 1   naltrexone (DEPADE) 50 MG tablet Take 1 tablet (50 mg total) by mouth daily. 90 tablet 1   indapamide (LOZOL) 2.5 MG tablet TAKE ONE TABLET BY MOUTH DAILY 90 tablet 0   No facility-administered medications prior to visit.    ROS Review of Systems  Constitutional:  Positive for unexpected weight change. Negative for chills, diaphoresis and fatigue.  HENT: Negative.    Eyes: Negative.   Respiratory:  Negative for cough, chest tightness, shortness of breath and wheezing.   Cardiovascular:  Positive for leg swelling. Negative for chest pain and palpitations.  Gastrointestinal:  Negative for abdominal pain, diarrhea, nausea and vomiting.  Endocrine: Negative.   Genitourinary: Negative.  Negative for difficulty urinating and dysuria.  Musculoskeletal:  Positive for neck pain. Negative  for arthralgias and myalgias.  Skin: Negative.  Negative for color change.  Neurological:  Positive for numbness. Negative for dizziness and weakness.  Hematological:  Negative for adenopathy. Does not bruise/bleed easily.  Psychiatric/Behavioral: Negative.      Objective:  BP 130/76 (BP Location: Left Arm, Patient Position: Sitting, Cuff Size: Large)   Pulse 63   Temp 97.8 F (36.6 C) (Oral)   Resp 16   Ht 5' 11"  (1.803 m)   Wt 247 lb (112 kg)   SpO2 96%   BMI 34.45 kg/m   BP Readings from Last 3 Encounters:  09/06/22 (!) 174/81  08/31/22 130/76  08/23/22 128/80    Wt Readings from Last 3 Encounters:  09/06/22 248 lb (112.5 kg)  08/31/22 247 lb (112 kg)  08/23/22 253 lb 9.6 oz (115 kg)    Physical Exam Vitals reviewed.  HENT:     Nose: Nose normal.     Mouth/Throat:     Mouth: Mucous membranes are moist.  Eyes:     General: No scleral icterus.    Conjunctiva/sclera: Conjunctivae normal.  Cardiovascular:     Rate and Rhythm: Normal rate and regular rhythm.     Pulses: Normal pulses.     Heart sounds: No murmur heard. Pulmonary:     Effort: Pulmonary effort is normal.     Breath sounds: No stridor. No wheezing, rhonchi or  rales.  Abdominal:     General: Abdomen is protuberant. There is no distension.     Palpations: There is no hepatomegaly, splenomegaly or mass.     Tenderness: There is no abdominal tenderness. There is no guarding.     Hernia: No hernia is present.  Musculoskeletal:     Cervical back: Neck supple.     Right lower leg: 1+ Pitting Edema present.     Left lower leg: 1+ Pitting Edema present.  Lymphadenopathy:     Cervical: No cervical adenopathy.  Skin:    General: Skin is warm and dry.  Neurological:     General: No focal deficit present.     Mental Status: He is alert. Mental status is at baseline.  Psychiatric:        Mood and Affect: Mood normal.        Behavior: Behavior normal.     Lab Results  Component Value Date   WBC 6.1  08/31/2022   HGB 11.8 (L) 08/31/2022   HCT 34.7 (L) 08/31/2022   PLT 173.0 08/31/2022   GLUCOSE 105 (H) 08/31/2022   CHOL 93 08/31/2022   TRIG 66.0 08/31/2022   HDL 43.90 08/31/2022   LDLDIRECT 101.0 12/26/2018   LDLCALC 36 08/31/2022   ALT 17 08/31/2022   AST 20 08/31/2022   NA 137 08/31/2022   K 4.1 08/31/2022   CL 103 08/31/2022   CREATININE 0.77 08/31/2022   BUN 21 08/31/2022   CO2 29 08/31/2022   TSH 0.75 04/13/2022   PSA 2.36 04/29/2022   INR 1.1 (H) 05/19/2021   HGBA1C 7.0 (H) 08/31/2022    MR LUMBAR SPINE WO CONTRAST  Result Date: 07/23/2022 [facility]UR[facility] REPORT STUDY DATE: 07/20/22 PATIENT NAME: Lee Lee DOB: 1951-09-05 MRN: [id number] ORDERING CLINICIAN: Melvenia Beam, MD CLINICAL HISTORY: 71 year old male with low back pain and numbness. EXAM: MR LUMBAR SPINE WO CONTRAST TECHNIQUE: MRI of the lumbar spine was obtained utilizing multiplanar, multiecho pulse sequences. CONTRAST: none COMPARISON: none IMAGING SITE: [facility] [place] FINDINGS: On sagittal views the vertebral bodies have normal height and alignment.  The conus medullaris terminates at the level of L1.  On axial views: L1-2: no spinal stenosis or foraminal narrowing L2-3: no spinal stenosis or foraminal narrowing L3-4: disc bulging with mild biforaminal stenosis L4-5: disc bulging with mild biforaminal stenosis L5-S1: disc bulging and facet hypertrophy with mild-moderate right and mild left foraminal stenosis Limited views of the aorta, kidneys, iliopsoas muscles and sacroiliac joints are unremarkable.   MRI lumbar spine without contrast demonstrating: -Mild degenerative changes with mild foraminal stenosis from L3-4 down to L5-S1 as above. INTERPRETING PHYSICIAN: Penni Bombard, MD Certified in Neurology, Neurophysiology and Neuroimaging [facility] [address], [place], [place] [address] [phone]  PCV ECHOCARDIOGRAM COMPLETE  Result Date: 08/13/2022 Echocardiogram 08/11/2022: Normal LV systolic function with visual EF 55-60%. Left ventricle cavity is normal in size. Mild concentric hypertrophy of the left ventricle. Normal global wall motion. Normal diastolic filling pattern, normal LAP. Calculated EF 54%. Left atrial cavity is mildly dilated at 4.4 cm. Structurally normal tricuspid valve with no regurgitation. No evidence of pulmonary hypertension. The aortic root is dilated. no significant change compared to prior.   MR LUMBAR SPINE WO CONTRAST  Result Date: 07/23/2022 [facility] NEUR[facility] REPORT STUDY DATE: 07/20/22 PATIENT NAME: Lee Lee DOB: 12-Feb-1951 MRN: [id number] ORDERING CLINICIAN: Melvenia Beam, MD  CLINICAL HISTORY: 71 year old male with low back pain and numbness. EXAM: MR LUMBAR SPINE WO CONTRAST TECHNIQUE: MRI of the lumbar spine was obtained utilizing multiplanar, multiecho pulse sequences. CONTRAST: none COMPARISON: none IMAGING SITE: [facility] [place] FINDINGS: On sagittal views the vertebral bodies have normal height and alignment.  The conus medullaris terminates at the level of L1.  On axial views: L1-2: no spinal stenosis or foraminal narrowing L2-3: no spinal stenosis or foraminal narrowing L3-4: disc bulging with mild biforaminal stenosis L4-5: disc bulging with mild biforaminal stenosis L5-S1: disc bulging and facet hypertrophy with mild-moderate right and mild left foraminal stenosis Limited views of the aorta, kidneys, iliopsoas muscles and sacroiliac joints are unremarkable.   MRI lumbar spine without contrast demonstrating: -Mild degenerative changes with mild foraminal stenosis from L3-4 down to L5-S1 as above. INTERPRETING PHYSICIAN: Penni Bombard, MD Certified in Neurology, Neurophysiology and Neuroimaging [facility] [address], [place], [place] [address] [phone]  VAS [place] ABI WITH/WO TBI  Result Date: 06/11/2022  LOWER EXTREMITY DOPPLER STUDY Patient Name:  Lee Lee  Date of Exam:   06/11/2022 Medical Rec #: [id number]          Accession #:    [id number] Date of Birth: 1951-06-24          Patient Gender: M Patient Age:   70 years Exam Location:  [facility] Procedure:      VAS [place] ABI WITH/WO TBI Referring Phys: Scarlette Calico --------------------------------------------------------------------------------  Indications: Neuropathy. High Risk Factors: No history of smoking. Other Factors: Patient denies claudication.  Performing Technologist: Ralene Cork RVT  Examination Guidelines: A complete evaluation includes at minimum, Doppler waveform signals and systolic blood pressure reading at the level of bilateral brachial, anterior tibial, and posterior tibial arteries, when vessel segments are accessible. Bilateral testing is considered an integral part of a complete examination. Photoelectric Plethysmograph (PPG) waveforms and toe systolic pressure readings are included as required and additional duplex testing as needed. Limited examinations for reoccurring indications may be performed as noted.  ABI Findings: +---------+------------------+-----+---------+--------+ Right    Rt Pressure (mmHg)IndexWaveform Comment  +---------+------------------+-----+---------+--------+ Brachial 123                                      +---------+------------------+-----+---------+--------+ PTA      163               1.28 triphasic         +---------+------------------+-----+---------+--------+ DP       168               1.32 triphasic         +---------+------------------+-----+---------+--------+ Great Toe143               1.13                   +---------+------------------+-----+---------+--------+ +---------+------------------+-----+---------+-------+ Left     Lt Pressure (mmHg)IndexWaveform  Comment +---------+------------------+-----+---------+-------+ Brachial 127                                     +---------+------------------+-----+---------+-------+ PTA      167               1.31 triphasic        +---------+------------------+-----+---------+-------+  DP       164               1.29 triphasic        +---------+------------------+-----+---------+-------+ Great Toe120               0.94                  +---------+------------------+-----+---------+-------+ +-------+-----------+-----------+------------+------------+ ABI/TBIToday's ABIToday's TBIPrevious ABIPrevious TBI +-------+-----------+-----------+------------+------------+ Right  [place]         1.13                                +-------+-----------+-----------+------------+------------+ Left   [place]         0.94                                +-------+-----------+-----------+------------+------------+  No previous ABI.  Summary: Right: Resting right ankle-brachial index indicates noncompressible right lower extremity arteries. The right toe-brachial index is normal. Normal pedal waveforms. Left: Resting left ankle-brachial index indicates noncompressible left lower extremity arteries. The left toe-brachial index is normal. Normal pedal waveforms. *See table(s) above for measurements and observations.  Electronically signed by Orlie Pollen on 06/11/2022 at 5:05:31 PM.    Final    VAS [place] LOWER EXTREMITY VENOUS (DVT)  Result Date: 06/11/2022  Lower Venous DVT Study Patient Name:  Lee Lee  Date of Exam:   06/10/2022 Medical Rec #: [id number]          Accession #:    [id number] Date of Birth: 11/01/1951          Patient Gender: M Patient Age:   79 years Exam Location:  [facility] Procedure:      VAS [place] LOWER EXTREMITY VENOUS (DVT) Referring Phys: Scarlette Calico --------------------------------------------------------------------------------  Indications: Bilateral edema since  February 2023 Other Indications: HTN, Atrial flutter, SVT. Performing Technologist: Alvia Grove RVT  Examination Guidelines: A complete evaluation includes B-mode imaging, spectral Doppler, color Doppler, and power Doppler as needed of all accessible portions of each vessel. Bilateral testing is considered an integral part of a complete examination. Limited examinations for reoccurring indications may be performed as noted. The reflux portion of the exam is performed with the patient in reverse Trendelenburg.  +---------+---------------+---------+-----------+----------+--------------+ RIGHT    CompressibilityPhasicitySpontaneityPropertiesThrombus Aging +---------+---------------+---------+-----------+----------+--------------+ CFV      Full           Yes      Yes                                 +---------+---------------+---------+-----------+----------+--------------+ SFJ      Full           Yes      Yes                                 +---------+---------------+---------+-----------+----------+--------------+ FV Prox  Full           Yes      Yes                                 +---------+---------------+---------+-----------+----------+--------------+ FV Mid   Full           Yes  Yes                                 +---------+---------------+---------+-----------+----------+--------------+ FV DistalFull           Yes      Yes                                 +---------+---------------+---------+-----------+----------+--------------+ PFV      Full           Yes      Yes                                 +---------+---------------+---------+-----------+----------+--------------+ POP      Full           Yes      Yes                                 +---------+---------------+---------+-----------+----------+--------------+ PTV      Full           Yes      Yes                                  +---------+---------------+---------+-----------+----------+--------------+ PERO     Full           Yes      Yes                                 +---------+---------------+---------+-----------+----------+--------------+ Gastroc  Full           Yes      Yes                                 +---------+---------------+---------+-----------+----------+--------------+ GSV      Full           Yes      Yes                                 +---------+---------------+---------+-----------+----------+--------------+ SSV      Full           Yes      Yes                                 +---------+---------------+---------+-----------+----------+--------------+   +---------+---------------+---------+-----------+----------+--------------+ LEFT     CompressibilityPhasicitySpontaneityPropertiesThrombus Aging +---------+---------------+---------+-----------+----------+--------------+ CFV      Full           Yes      Yes                                 +---------+---------------+---------+-----------+----------+--------------+ SFJ      Full           Yes      Yes                                 +---------+---------------+---------+-----------+----------+--------------+  FV Prox  Full           Yes      Yes                                 +---------+---------------+---------+-----------+----------+--------------+ FV Mid   Full           Yes      Yes                                 +---------+---------------+---------+-----------+----------+--------------+ FV DistalFull           Yes      Yes                                 +---------+---------------+---------+-----------+----------+--------------+ PFV      Full           Yes      Yes                                 +---------+---------------+---------+-----------+----------+--------------+ POP      Full           Yes      Yes                                  +---------+---------------+---------+-----------+----------+--------------+ PTV      Full           Yes      Yes                                 +---------+---------------+---------+-----------+----------+--------------+ PERO     Full           Yes      Yes                                 +---------+---------------+---------+-----------+----------+--------------+ Gastroc  Full           Yes      Yes                                 +---------+---------------+---------+-----------+----------+--------------+ GSV      Full           Yes      Yes                                 +---------+---------------+---------+-----------+----------+--------------+ SSV      Full           Yes      Yes                                 +---------+---------------+---------+-----------+----------+--------------+    Findings reported to Cobb at 1:45.  Summary: BILATERAL: - No evidence of deep vein thrombosis seen in the lower extremities, bilaterally. - No evidence of superficial venous thrombosis in the lower extremities, bilaterally. -   *See table(s) above for  measurements and observations. Electronically signed by Orlie Pollen on 06/11/2022 at 5:02:40 PM.    Final      Assessment & Plan:   Jimmy was seen today for annual exam, anemia and hypertension.  Diagnoses and all orders for this visit:  B12 deficiency -     CBC with Differential/Platelet; Future -     CBC with Differential/Platelet -     cyanocobalamin (VITAMIN B12) injection 1,000 mcg  Thiamine deficiency -     CBC with Differential/Platelet; Future -     CBC with Differential/Platelet  Flu vaccine need -     Flu Vaccine QUAD High Dose(Fluad)  Essential hypertension- His blood pressure is well controlled. -     Basic metabolic panel; Future -     Basic metabolic panel  Steatohepatitis- His liver enzymes are normal. -     Hepatic function panel; Future -     Hepatic function panel  Hyperglycemia- He has developed  DM 2. -     Hemoglobin A1c; Future -     Hemoglobin A1c  Other iron deficiency anemia -     IBC + Ferritin; Future -     CBC with Differential/Platelet; Future -     CBC with Differential/Platelet -     IBC + Ferritin  Encounter for general adult medical examination with abnormal findings- Exam completed, labs reviewed, vaccines reviewed and updated, cancer screenings are up-to-date, patient education was given.  Hyperlipidemia with target LDL less than 130- LDL goal achieved. Doing well on the statin  -     Lipid panel; Future -     Lipid panel  Acute neck pain- Will treat for wryneck. -     DG Cervical Spine Complete; Future -     tiZANidine (ZANAFLEX) 2 MG tablet; Take 1 tablet (2 mg total) by mouth every 8 (eight) hours as needed for muscle spasms.  Type II diabetes mellitus with manifestations (HCC) -     tirzepatide (MOUNJARO) 2.5 MG/0.5ML Pen; Inject 2.5 mg into the skin once a week.   I have discontinued Adon Gehlhausen. Campanelli's indapamide. I am also having him start on tiZANidine and tirzepatide. Additionally, I am having him maintain his b complex vitamins, traZODone, rOPINIRole, busPIRone, desvenlafaxine, and losartan. We administered cyanocobalamin.  Meds ordered this encounter  Medications   cyanocobalamin (VITAMIN B12) injection 1,000 mcg   tiZANidine (ZANAFLEX) 2 MG tablet    Sig: Take 1 tablet (2 mg total) by mouth every 8 (eight) hours as needed for muscle spasms.    Dispense:  90 tablet    Refill:  1   tirzepatide (MOUNJARO) 2.5 MG/0.5ML Pen    Sig: Inject 2.5 mg into the skin once a week.    Dispense:  2 mL    Refill:  0     Follow-up: Return in about 6 months (around 03/02/2023).  Scarlette Calico, MD

## 2022-09-01 MED ORDER — TIRZEPATIDE 2.5 MG/0.5ML ~~LOC~~ SOAJ: 2.5000 mg | SUBCUTANEOUS | 0 refills | Status: DC

## 2022-09-02 ENCOUNTER — Telehealth: Payer: Self-pay

## 2022-09-02 NOTE — Telephone Encounter (Signed)
Key: [id number]

## 2022-09-02 NOTE — Telephone Encounter (Signed)
Approved  05-OCT-23  - 31-DEC-24

## 2022-09-06 ENCOUNTER — Telehealth: Payer: Self-pay | Admitting: Neurology

## 2022-09-06 ENCOUNTER — Encounter: Payer: Self-pay | Admitting: Neurology

## 2022-09-06 ENCOUNTER — Ambulatory Visit: Payer: PPO | Admitting: Neurology

## 2022-09-06 ENCOUNTER — Other Ambulatory Visit: Payer: Self-pay | Admitting: Internal Medicine

## 2022-09-06 VITALS — BP 174/81 | HR 63 | Ht 71.0 in | Wt 248.0 lb

## 2022-09-06 DIAGNOSIS — G5603 Carpal tunnel syndrome, bilateral upper limbs: Secondary | ICD-10-CM | POA: Diagnosis not present

## 2022-09-06 DIAGNOSIS — G621 Alcoholic polyneuropathy: Secondary | ICD-10-CM | POA: Insufficient documentation

## 2022-09-06 DIAGNOSIS — G63 Polyneuropathy in diseases classified elsewhere: Secondary | ICD-10-CM

## 2022-09-06 DIAGNOSIS — D839 Common variable immunodeficiency, unspecified: Secondary | ICD-10-CM | POA: Diagnosis not present

## 2022-09-06 DIAGNOSIS — E538 Deficiency of other specified B group vitamins: Secondary | ICD-10-CM | POA: Diagnosis not present

## 2022-09-06 DIAGNOSIS — F1011 Alcohol abuse, in remission: Secondary | ICD-10-CM

## 2022-09-06 NOTE — Telephone Encounter (Signed)
Referral for hand surgery sent to [facility]. Phone: [phone], Fax: [phone]

## 2022-09-06 NOTE — Progress Notes (Signed)
[facility]    Provider:  Dr Jaynee Eagles Requesting Provider: Janith Lima, MD Primary Care Provider:  Janith Lima, MD  CC: peripheral neuropathy, trigger finger, CTS  07/20/2022: He saw vascular doctor and he has chronic venous insufficiency. Also likely CTS.   I do think the lower extremities are due to pitting edema, chronic venous insifficiency, prior b12 deficiency and alcohol abuse. But hopefully procedures planned at vascular center will help with the symptoms.  For the hand numbness, think should have an emg/ncs to evaluate for CTS, +Mcphalen's maneuver, he has a trigger finger on the left ring finger, send to [place] [facility] for eveluation of trigger finger and emg/ncs for nerve entrapment like CTS. He has neck pain but no radicular symptoms so unlikely cervical radiculopathy.  MRI lumbar spine showed: FINDINGS: were unremarkable mild degenerative disease. Perosnally reviewed images and agree with the following   On sagittal views the vertebral bodies have normal height and alignment.  The conus medullaris terminates at the level of L1.     On axial views:    L1-2: no spinal stenosis or foraminal narrowing  L2-3: no spinal stenosis or foraminal narrowing  L3-4: disc bulging with mild biforaminal stenosis  L4-5: disc bulging with mild biforaminal stenosis  L5-S1: disc bulging and facet hypertrophy with mild-moderate right and mild left foraminal stenosis   Patient complains of symptoms per HPI as well as the following symptoms: hand numbness . Pertinent negatives and positives per HPI. All others negative     HPI:  Lee Lee is a 71 y.o. male here as requested by Janith Lima, MD for tingling of both feet. Here with his wife who also provides information. PMHx oral artery disease, bilateral leg edema, hypertension, deficiency anemia, alcohol abuse, thiamine deficiency, major depression, remote B12 deficiency, osteoarthritis, insomnia,  hyperlipidemia, hypertriglyceridemia, erectile dysfunction.  Patient is here with his wife who provides much information.  Started in January just In the right foot in the pad and he went to a podiatrist and a shot of cortizone did not help then started in left foot as well, the whole bottom of each foot is involved including the heel. He has low back and restless legs he is on ropinerole and that initially helped but its starting to get worse. Tingling and numbness on the bottom of the feet, getting worse progressively slowly worse. The podiatrist gave him gabapentin and lyrica but doesn't help. His mother had restles legs. He had low iron and he had infusions but did not help with restless legs. No shooting pains in low back but he has stiffness and achiness and going to therapy once a week he plays a lot of golf. Still not getting better, he tries to walk a lot, he was drinking wine a bottle a day stopped 8 months ago but at that time he completely didn't have numbness in his feet.   Reviewed notes, labs and imaging from outside physicians, which showed   I reviewed Dr. Ronnald Ramp notes, he has numbness tingling and edema in his lower legs, he has a referral to vascular for peripheral artery disease, he denies claudication chest pain or shortness of breath, he does have weight gain, he is abstaining from alcohol, he has worsening restless leg symptoms and leg pain.  - Reviewed recent labs: 04/29/2022 hgba1c 4.5 B1 843 Folate nml 04/13/22 tsh nm, B12 was elevated but it was deficient to 211 in 2021 which may definitely be a contribution. Ferritin was  207  Review of Systems: Patient complains of symptoms per HPI as well as the following symptoms paresthesias. Pertinent negatives and positives per HPI. All others negative.   Social History   Socioeconomic History   Marital status: Married    Spouse name: Not on file   Number of children: 2   Years of education: Not on file   Highest education level:  Not on file  Occupational History   Occupation: Retired  Tobacco Use   Smoking status: Former    Types: Cigars    Quit date: 2002    Years since quitting: 21.7    Passive exposure: Past   Smokeless tobacco: Current    Types: Chew   Tobacco comments:    chews tobacco when playing golf only  Vaping Use   Vaping Use: Never used  Substance and Sexual Activity   Alcohol use: Not Currently   Drug use: No   Sexual activity: Yes    Partners: Female  Other Topics Concern   Not on file  Social History Narrative   Not on file   Social Determinants of Health   Financial Resource Strain: Low Risk  (09/11/2021)   Overall Financial Resource Strain (CARDIA)    Difficulty of Paying Living Expenses: Not hard at all  Food Insecurity: No Food Insecurity (09/11/2021)   Hunger Vital Sign    Worried About Running Out of Food in the Last Year: Never true    Moody in the Last Year: Never true  Transportation Needs: No Transportation Needs (09/11/2021)   PRAPARE - Hydrologist (Medical): No    Lack of Transportation (Non-Medical): No  Physical Activity: Sufficiently Active (09/11/2021)   Exercise Vital Sign    Days of Exercise per Week: 5 days    Minutes of Exercise per Session: 30 min  Stress: No Stress Concern Present (09/11/2021)   [place]    Feeling of Stress : Not at all  Social Connections: [place] (09/11/2021)   Social Connection and Isolation Panel [NHANES]    Frequency of Communication with Friends and Family: More than three times a week    Frequency of Social Gatherings with Friends and Family: More than three times a week    Attends Religious Services: 1 to 4 times per year    Active Member of [employer] or Organizations: Yes    Attends Music therapist: More than 4 times per year    Marital Status: Married  Human resources officer Violence: Not At Risk  (09/11/2021)   Humiliation, Afraid, Rape, and Kick questionnaire    Fear of Current or Ex-Partner: No    Emotionally Abused: No    Physically Abused: No    Sexually Abused: No    Family History  Problem Relation Age of Onset   Valvular heart disease Mother    Lymphoma Mother    Restless legs syndrome Mother    Neuropathy Neg Hx     Past Medical History:  Diagnosis Date   Alcohol abuse, episodic drinking behavior    Anxiety    Arthritis    back & knees   Atrial flutter ([place])    s/p RFCA 01/05/13   Calcium oxalate renal stones    Complication of anesthesia    makes him loopy   Depression    ED (erectile dysfunction)    Hemorrhoids    Hypertension     Patient Active Problem List  Diagnosis Date Noted   Bilateral carpal tunnel syndrome 09/06/2022   CVI (common variable immunodeficiency) ([place]) 85/27/7824   Alcoholic peripheral neuropathy ([place]) 09/06/2022   Acute neck pain 08/31/2022   PAD (peripheral artery disease) ([place]) 06/10/2022   Alcohol abuse 10/01/2021   Thiamine deficiency 06/01/2021   Encounter for general adult medical examination with abnormal findings 12/31/2020   Steatohepatitis 06/18/2020   Severe episode of recurrent major depressive disorder, without psychotic features ([place]) 06/18/2020   Abnormal electrocardiogram (ECG) (EKG) 06/18/2020   Vitamin B12 deficiency neuropathy ([place]) 12/24/2019   Thoracic aortic aneurysm without rupture ([place]) 12/27/2018   Seasonal allergic rhinitis due to pollen 12/27/2018   Benign prostatic hyperplasia without lower urinary tract symptoms 12/26/2018   Unilateral primary osteoarthritis, left knee 03/08/2018   Insomnia due to anxiety and fear 09/30/2016   Restless leg syndrome, familial 09/30/2016   Iron deficiency anemia 09/30/2016   Hypertriglyceridemia 06/15/2016   Hyperlipidemia with target LDL less than 130 06/15/2016   Hyperglycemia 06/15/2016   S/P radiofrequency ablation operation for arrhythmia 07/04/15 07/05/2015    SVT (supraventricular tachycardia) 07/04/2015   Melanoma of skin ([place]) 04/16/2015   Atrial flutter ([place]) 12/07/2012   Essential hypertension 11/07/2012   Ascending aortic aneurysm ([place]) 11/07/2012   ED (erectile dysfunction) 11/15/2011    Past Surgical History:  Procedure Laterality Date   ABLATION OF DYSRHYTHMIC FOCUS  01/05/2013   ARTHROSCOPIC REPAIR ACL     ATRIAL FLUTTER ABLATION N/A 01/05/2013   Procedure: ATRIAL FLUTTER ABLATION;  Surgeon: Evans Lance, MD;  [facility] C[facility] LAB;  Service: Cardiovascular;  Laterality: N/A;   COLONOSCOPY  11/2009   Dr. Benson Norway   ELECTROPHYSIOLOGIC STUDY N/A 07/04/2015   Procedure: A-Flutter Ablation;  Surgeon: Evans Lance, MD;  Location: [facility] CV LAB;  Service: Cardiovascular;  Laterality: N/A;   ROTATOR CUFF REPAIR     TOTAL KNEE ARTHROPLASTY Left 10/30/2019   Procedure: TOTAL KNEE ARTHROPLASTY;  Surgeon: Paralee Cancel, MD;  Location: [facility] ORS;  Service: Orthopedics;  Laterality: Left;  70 mins    Current Outpatient Medications  Medication Sig Dispense Refill   b complex vitamins capsule Take 1 capsule by mouth daily.     busPIRone (BUSPAR) 5 MG tablet TAKE ONE TABLET BY MOUTH THREE TIMES A DAY 270 tablet 0   desvenlafaxine (PRISTIQ) 50 MG 24 hr tablet Take 1 tablet (50 mg total) by mouth daily. 90 tablet 0   losartan (COZAAR) 25 MG tablet Take 1 tablet (25 mg total) by mouth every evening. 90 tablet 3   naltrexone (DEPADE) 50 MG tablet Take 1 tablet (50 mg total) by mouth daily. 90 tablet 1   rOPINIRole (REQUIP) 2 MG tablet Take 1 tablet (2 mg total) by mouth at bedtime. 90 tablet 1   tirzepatide (MOUNJARO) 2.5 MG/0.5ML Pen Inject 2.5 mg into the skin once a week. 2 mL 0   tiZANidine (ZANAFLEX) 2 MG tablet Take 1 tablet (2 mg total) by mouth every 8 (eight) hours as needed for muscle spasms. 90 tablet 1   traZODone (DESYREL) 150 MG tablet Take 1 tablet (150 mg total) by mouth at bedtime as needed for sleep. 90 tablet 1   No current  facility-administered medications for this visit.    Allergies as of 09/06/2022   (No Known Allergies)    Vitals: BP (!) 174/81   Pulse 63   Ht 5' 11"  (1.803 m)   Wt 248 lb (112.5 kg)   BMI 34.59 kg/m  Last Weight:  Wt Readings from Last 1 Encounters:  09/06/22 248 lb (112.5 kg)   Last Height:   Ht Readings from Last 1 Encounters:  09/06/22 5' 11"  (1.803 m)     Physical exam: Exam: Gen: NAD, conversant, well nourised, obese, well groomed                     CV: RRR, no MRG. No Carotid Bruits. No peripheral edema, warm, nontender Eyes: Conjunctivae clear without exudates or hemorrhage  Neuro: Detailed Neurologic Exam  Speech:    Speech is normal; fluent and spontaneous with normal comprehension.  Cognition:    The patient is oriented to person, place, and time;     recent and remote memory intact;     language fluent;     normal attention, concentration,     fund of knowledge Cranial Nerves:    The pupils are equal, round, and reactive to light.  Attempted pupils too small to visualize fundi. Visual fields are full to finger confrontation. Extraocular movements are intact. Trigeminal sensation is intact and the muscles of mastication are normal. The face is symmetric. The palate elevates in the midline. Hearing intact. Voice is normal. Shoulder shrug is normal. The tongue has normal motion without fasciculations.   Coordination:    Normal finger to nose and heel to shin. Normal rapid alternating movements.   Gait:    Slightly antalgic  Motor Observation:    No asymmetry, no atrophy, and no involuntary movements noted. Tone:    Normal muscle tone.    Posture:    Slightly stooped ans slightly antalgic     Strength:    Strength is V/V in the upper and lower limbs.      Sensation: intact to LT, vibration, proprioception, 3 seconds vibration     Reflex Exam:  DTR's:    Deep tendon reflexes in the upper and lower extremities are normal bilaterally.    Toes:    The toes are downgoing bilaterally.   Clonus:    Absent AJs. Trace patellars. Normal biceps      Assessment/Plan: 71 y.o. male here as requested by Janith Lima, MD for tingling of both feet and likely CTS. Here with his wife who also provides information. PMHx coronary artery disease, bilateral leg edema, hypertension, deficiency anemia, alcohol abuse, thiamine deficiency, major depression, remote B12 deficiency, osteoarthritis, insomnia, hyperlipidemia, hypertriglyceridemia, erectile dysfunction.  He has numbness and tingling on the bottom of his feet.  Also likely CTS.  Answered all questions.   For the hand numbness, +Mcphalen's maneuver, wakes up with numbness resolves on changing positions, suspect CTS. He also  has a trigger finger on the left ring finger, send to [facility] for evaluation of CTS, trigger finger and emg/ncs for nerve entrapment like CTS. He has neck pain but no radicular symptoms so unlikely cervical radiculopathy.  He saw vascular doctor and he has chronic venous insufficiency.  I do think the lower extremities are due to pitting edema, chronic venous insifficiency, prior b12 deficiency and alcpohol abuse. But hopefully procedures planned at vascular center will help with the symptoms; he has distal rubor in his legs with shiny skin and loss of hair, vascular disease could be an etiology of his numbness and tingling as well as his restless legs.- His risk factors for peripheral polyneuropathy likely small fiber include remote B12 deficiency, history of alcohol abuse (was drinking a bottle of wine a day stopped 8 months ago), CVI, low back pain,  central obesity.  -Blood work: - Reviewed recent labs: 04/29/2022 hgba1c 4.5 B1 843 Folate nml 04/13/22 tsh nm, B12 was elevated but it was deficient to 211 in 2021 which may definitely be a contribution. Ferritin was 207    Orders Placed This Encounter  Procedures   Ambulatory referral to Hand Surgery    No orders of the defined types were placed in this encounter.   Cc: Janith Lima, MD,  Janith Lima, MD  Sarina Ill, MD  [facility] [address] [place], [place] [address]  Phone [phone] Fax [phone]  I spent over 35 minutes of face-to-face and non-face-to-face time with patient on the  1. Bilateral carpal tunnel syndrome   2. CVI (common variable immunodeficiency) ([place])   3. Vitamin B12 deficiency neuropathy ([place])   4. Alcoholic peripheral neuropathy ([facility])    diagnosis.  This included previsit chart review, lab review, study review, order entry, electronic health record documentation, patient education on the different diagnostic and therapeutic options, counseling and coordination of care, risks and benefits of management, compliance, or risk factor reduction

## 2022-09-06 NOTE — Patient Instructions (Signed)

## 2022-09-07 ENCOUNTER — Encounter: Payer: Self-pay | Admitting: Neurology

## 2022-09-08 ENCOUNTER — Encounter: Payer: Self-pay | Admitting: Internal Medicine

## 2022-09-10 ENCOUNTER — Other Ambulatory Visit: Payer: Self-pay | Admitting: Internal Medicine

## 2022-09-10 DIAGNOSIS — G2581 Restless legs syndrome: Secondary | ICD-10-CM

## 2022-09-10 MED ORDER — ROPINIROLE HCL ER 4 MG PO TB24: 4.0000 mg | ORAL_TABLET | Freq: Every day | ORAL | 0 refills | Status: DC

## 2022-09-20 ENCOUNTER — Ambulatory Visit (INDEPENDENT_AMBULATORY_CARE_PROVIDER_SITE_OTHER): Payer: PPO

## 2022-09-20 DIAGNOSIS — Z0000 Encounter for general adult medical examination without abnormal findings: Secondary | ICD-10-CM | POA: Diagnosis not present

## 2022-09-20 NOTE — Progress Notes (Signed)
Virtual Visit via Telephone Note  I connected with  Lee Lee on 09/20/22 at 10:15 AM EDT by telephone and verified that I am speaking with the correct person using two identifiers.  Location: Patient: Home Provider: [place] Persons participating in the virtual visit: [name]   I discussed the limitations, risks, security and privacy concerns of performing an evaluation and management service by telephone and the availability of in person appointments. The patient expressed understanding and agreed to proceed.  Interactive audio and video telecommunications were attempted between this nurse and patient, however failed, due to patient having technical difficulties OR patient did not have access to video capability.  We continued and completed visit with audio only.  Some vital signs may be absent or patient reported.   Sheral Flow, LPN  Subjective:   Lee Lee is a 71 y.o. male who presents for Medicare Annual/Subsequent preventive examination.  Review of Systems     Cardiac Risk Factors include: advanced age (>19mn, >>32women);family history of premature cardiovascular disease;hypertension;male gender;obesity (BMI >30kg/m2)     Objective:    There were no vitals filed for this visit. There is no height or weight on file to calculate BMI.     09/20/2022   10:19 AM 12/16/2021    5:46 PM 09/28/2021    9:04 PM 09/28/2021    7:04 PM 09/27/2021    1:48 PM 09/11/2021   10:28 AM 05/20/2021    9:35 PM  Advanced Directives  Does Patient Have a Medical Advance Directive? Yes No Yes Yes Yes Yes Yes  Type of AParamedicof A[place]resLiving will  [place] will [place] will Living will;Healthcare Power of Attorney Living will;Healthcare Power of [place] will  Does patient want to make changes to medical advance directive?    No - Patient declined  No - Patient declined No - Patient declined   Copy of [place] Chart? No - copy requested  No - copy requested  No - copy requested No - copy requested No - copy requested  Would patient like information on creating a medical advance directive?       No - Patient declined    Current Medications (verified) Outpatient Encounter Medications as of 09/20/2022  Medication Sig   b complex vitamins capsule Take 1 capsule by mouth daily.   busPIRone (BUSPAR) 5 MG tablet TAKE ONE TABLET BY MOUTH THREE TIMES A DAY   desvenlafaxine (PRISTIQ) 50 MG 24 hr tablet Take 1 tablet (50 mg total) by mouth daily.   losartan (COZAAR) 25 MG tablet Take 1 tablet (25 mg total) by mouth every evening.   naltrexone (DEPADE) 50 MG tablet TAKE ONE TABLET BY MOUTH DAILY   rOPINIRole (REQUIP XL) 4 MG 24 hr tablet Take 1 tablet (4 mg total) by mouth at bedtime.   tirzepatide ([facility] 2.5 MG/0.5ML Pen Inject 2.5 mg into the skin once a week.   traZODone (DESYREL) 150 MG tablet Take 1 tablet (150 mg total) by mouth at bedtime as needed for sleep.   tiZANidine (ZANAFLEX) 2 MG tablet Take 1 tablet (2 mg total) by mouth every 8 (eight) hours as needed for muscle spasms. (Patient not taking: Reported on 09/20/2022)   No facility-administered encounter medications on file as of 09/20/2022.    Allergies (verified) Patient has no known allergies.   History: Past Medical History:  Diagnosis Date   Alcohol  abuse, episodic drinking behavior    Anxiety    Arthritis    back & knees   Atrial flutter (HCC)    s/p RFCA 01/05/13   Calcium oxalate renal stones    Complication of anesthesia    makes him loopy   Depression    ED (erectile dysfunction)    Hemorrhoids    Hypertension    Past Surgical History:  Procedure Laterality Date   ABLATION OF DYSRHYTHMIC FOCUS  01/05/2013   ARTHROSCOPIC REPAIR ACL     ATRIAL FLUTTER ABLATION N/A 01/05/2013   Procedure: ATRIAL FLUTTER  ABLATION;  Surgeon: Evans Lance, MD;  Location: [facility] CATH LAB;  Service: Cardiovascular;  Laterality: N/A;   COLONOSCOPY  11/2009   Dr. Benson Norway   ELECTROPHYSIOLOGIC STUDY N/A 07/04/2015   Procedure: A-Flutter Ablation;  Surgeon: Evans Lance, MD;  Location: [facility] CV LAB;  Service: Cardiovascular;  Laterality: N/A;   ROTATOR CUFF REPAIR     TOTAL KNEE ARTHROPLASTY Left 10/30/2019   Procedure: TOTAL KNEE ARTHROPLASTY;  Surgeon: Paralee Cancel, MD;  Location: [facility] ORS;  Service: Orthopedics;  Laterality: Left;  70 mins   Family History  Problem Relation Age of Onset   Valvular heart disease Mother    Lymphoma Mother    Restless legs syndrome Mother    Neuropathy Neg Hx    Social History   Socioeconomic History   Marital status: Married    Spouse name: Not on file   Number of children: 2   Years of education: Not on file   Highest education level: Not on file  Occupational History   Occupation: Retired  Tobacco Use   Smoking status: Former    Types: Cigars    Quit date: 2002    Years since quitting: 21.8    Passive exposure: Past   Smokeless tobacco: Current    Types: Chew   Tobacco comments:    chews tobacco when playing golf only  Vaping Use   Vaping Use: Never used  Substance and Sexual Activity   Alcohol use: Not Currently   Drug use: No   Sexual activity: Yes    Partners: Female  Other Topics Concern   Not on file  Social History Narrative   Not on file   Social Determinants of Health   Financial Resource Strain: [place]  (09/20/2022)   Overall Financial Resource Strain (CARDIA)    Difficulty of Paying Living Expenses: Not hard at all  Food Insecurity: No Food Insecurity (09/20/2022)   Hunger Vital Sign    Worried About Running Out of Food in the Last Year: Never true    [place] in the Last Year: Never true  Transportation Needs: No Transportation Needs (09/20/2022)   PRAPARE - Hydrologist (Medical): No    Lack of  Transportation (Non-Medical): No  Physical Activity: Sufficiently Active (09/20/2022)   Exercise Vital Sign    Days of Exercise per Week: 5 days    Minutes of Exercise per Session: 30 min  Stress: No Stress Concern Present (09/20/2022)   [place]    Feeling of Stress : Not at all  Social Connections: [place] (09/20/2022)   Social Connection and Isolation Panel [NHANES]    Frequency of Communication with Friends and Family: More than three times a week    Frequency of Social Gatherings with Friends and Family: More than three times a week  Attends Religious Services: 1 to 4 times per year    Active Member of Clubs or Organizations: Yes    Attends Archivist Meetings: More than 4 times per year    Marital Status: Married    Tobacco Counseling Ready to quit: Not Answered Counseling given: Not Answered Tobacco comments: chews tobacco when playing golf only   Clinical Intake:  Pre-visit preparation completed: Yes  Pain : No/denies pain     BMI - recorded: 34.6 (09/06/2022) Nutritional Status: BMI > 30  Obese Nutritional Risks: None Diabetes: No  How often do you need to have someone help you when you read instructions, pamphlets, or other written materials from your doctor or pharmacy?: 1 - Never What is the last grade level you completed in school?: HSG  Diabetic? no  Interpreter Needed?: No  Information entered by :: Lisette Abu, LPN.   Activities of Daily Living    09/20/2022   10:30 AM  In your present state of health, do you have any difficulty performing the following activities:  Hearing? 0  Vision? 0  Difficulty concentrating or making decisions? 0  Walking or climbing stairs? 0  Dressing or bathing? 0  Doing errands, shopping? 0  Preparing Food and eating ? N  Using the Toilet? N  In the past six months, have you accidently leaked urine? N  Do you have  problems with loss of bowel control? N  Managing your Medications? N  Managing your Finances? N  Housekeeping or managing your Housekeeping? N    Patient Care Team: Janith Lima, MD as PCP - General (Internal Medicine) Marybelle Killings, MD as Consulting Physician (Orthopedic Surgery) Charlton Haws, [facility] as Pharmacist (Pharmacist) Adrian Prows, MD as Consulting Physician (Cardiology) Marygrace Drought, MD as Consulting Physician (Ophthalmology)  Indicate any recent Medical Services you may have received from other than [facility] providers in the past year (date may be approximate).     Assessment:   This is a routine wellness examination for Lee Lee.  Hearing/Vision screen Hearing Screening - Comments:: Denies hearing difficulties   Vision Screening - Comments:: Wears rx glasses - up to date with routine eye exams with Marygrace Drought, MD.   Dietary issues and exercise activities discussed: Current Exercise Habits: Home exercise routine, Type of exercise: walking, Time (Minutes): 30, Frequency (Times/Week): 5, Weekly Exercise (Minutes/Week): 150, Intensity: Moderate, Exercise limited by: orthopedic condition(s)   Goals Addressed             This Visit's Progress    My goal is to continue to lose weight.  My weight goal is 210 pounds.        Depression Screen    09/20/2022   10:22 AM 06/10/2022    8:03 AM 02/01/2022    9:19 AM 01/29/2022   11:01 AM 09/11/2021   10:27 AM 12/25/2020    8:32 AM 05/26/2020   11:11 AM  PHQ 2/9 Scores  PHQ - 2 Score 0 0 0 0 0 0 3  PHQ- 9 Score  3 0   0 8    Fall Risk    09/20/2022   10:20 AM 01/29/2022   11:00 AM 09/11/2021   10:29 AM 05/19/2021    3:31 PM 05/26/2020   11:10 AM  Fall Risk   Falls in the past year? 0 1 0 1 0  Number falls in past yr: 0 0 0 0 0  Injury with Fall? 0 0 0 1 0  Risk for fall due  to : No Fall Risks  No Fall Risks  Orthopedic patient  Follow up Falls prevention discussed  Falls evaluation completed  Falls evaluation  completed;Education provided    FALL RISK PREVENTION PERTAINING TO THE HOME:  Any stairs in or around the home? Yes  If so, are there any without handrails? No  Home free of loose throw rugs in walkways, pet beds, electrical cords, etc? Yes  Adequate lighting in your home to reduce risk of falls? Yes   ASSISTIVE DEVICES UTILIZED TO PREVENT FALLS:  Life alert? No  Use of a cane, walker or w/c? No  Grab bars in the bathroom? Yes  Shower chair or bench in shower? Yes  Elevated toilet seat or a handicapped toilet? No   TIMED UP AND GO:  Was the test performed? No . Phone Visit   Cognitive Function:    12/26/2018   12:31 PM  MMSE - Mini Mental State Exam  Orientation to time 5  Orientation to Place 5  Registration 3  Attention/ Calculation 5  Recall 2  Language- name 2 objects 2  Language- repeat 1  Language- follow 3 step command 3  Language- read & follow direction 1  Write a sentence 1  Copy design 1  Total score 29        09/20/2022   10:31 AM  6CIT Screen  What Year? 0 points  What month? 0 points  What time? 0 points  Count back from 20 0 points  Months in reverse 0 points  Repeat phrase 0 points  Total Score 0 points    Immunizations Immunization History  Administered Date(s) Administered   Fluad Quad(high Dose 65+) 08/07/2021, 08/31/2022   Hepatitis A, Adult 07/17/2019   Influenza, High Dose Seasonal PF 07/27/2018, 07/17/2019   Influenza,inj,Quad PF,6+ Mos 10/31/2017   Influenza-Unspecified 07/19/2019, 09/29/2020, 08/13/2021   Moderna Sars-Covid-2 Vaccination 01/15/2020, 02/14/2020, 08/07/2021   Pneumococcal Conjugate-13 04/28/2017   Pneumococcal Polysaccharide-23 12/26/2018   Tdap 04/28/2017   Zoster Recombinat (Shingrix) 08/23/2018, 10/10/2018    TDAP status: Up to date  Flu Vaccine status: Up to date  Pneumococcal vaccine status: Up to date  Covid-19 vaccine status: Completed vaccines  Qualifies for Shingles Vaccine? Yes   Zostavax  completed Yes   Shingrix Completed?: Yes  Screening Tests Health Maintenance  Topic Date Due   Diabetic kidney evaluation - Urine ACR  Never done   Diabetic kidney evaluation - GFR measurement  09/01/2023   COLONOSCOPY (Pts 45-21yr Insurance coverage will need to be confirmed)  04/13/2026   TETANUS/TDAP  04/29/2027   Pneumonia Vaccine 71 Years old  Completed   INFLUENZA VACCINE  Completed   Hepatitis C Screening  Completed   Zoster Vaccines- Shingrix  Completed   HPV VACCINES  Aged Out   COVID-19 Vaccine  Discontinued    Health Maintenance  Health Maintenance Due  Topic Date Due   Diabetic kidney evaluation - Urine ACR  Never done    Colorectal cancer screening: Type of screening: Colonoscopy. Completed 04/13/2016. Repeat every 10 years  Lung Cancer Screening: (Low Dose CT Chest recommended if Age 69-80years, 30 pack-year currently smoking OR have quit w/in 15years.) does not qualify.   Lung Cancer Screening Referral: no  Additional Screening:  Hepatitis C Screening: does qualify; Completed 07/12/2022  Vision Screening: Recommended annual ophthalmology exams for early detection of glaucoma and other disorders of the eye. Is the patient up to date with their annual eye exam?  Yes  Who is the  provider or what is the name of the office in which the patient attends annual eye exams? Marygrace Drought, MD. If pt is not established with a provider, would they like to be referred to a provider to establish care? No .   Dental Screening: Recommended annual dental exams for proper oral hygiene  Community Resource Referral / Chronic Care Management: CRR required this visit?  No   CCM required this visit?  No      Plan:     I have personally reviewed and noted the following in the patient's chart:   Medical and social history Use of alcohol, tobacco or illicit drugs  Current medications and supplements including opioid prescriptions. Patient is not currently taking opioid  prescriptions. Functional ability and status Nutritional status Physical activity Advanced directives List of other physicians Hospitalizations, surgeries, and ER visits in previous 12 months Vitals Screenings to include cognitive, depression, and falls Referrals and appointments  In addition, I have reviewed and discussed with patient certain preventive protocols, quality metrics, and best practice recommendations. A written personalized care plan for preventive services as well as general preventive health recommendations were provided to patient.     Sheral Flow, LPN   [id number]   Nurse Notes: N/A

## 2022-09-20 NOTE — Patient Instructions (Signed)
[name] , Thank you for taking time to come for your Medicare Wellness Visit. I appreciate your ongoing commitment to your health goals. Please review the following plan we discussed and let me know if I can assist you in the future.   These are the goals we discussed:  Goals      My goal is to continue to lose weight.  My weight goal is 210 pounds.        This is a list of the screening recommended for you and due dates:  Health Maintenance  Topic Date Due   Yearly kidney health urinalysis for diabetes  Never done   Yearly kidney function blood test for diabetes  09/01/2023   Colon Cancer Screening  04/13/2026   Tetanus Vaccine  04/29/2027   Pneumonia Vaccine  Completed   Flu Shot  Completed   Hepatitis C Screening: USPSTF Recommendation to screen - Ages 18-79 yo.  Completed   Zoster (Shingles) Vaccine  Completed   HPV Vaccine  Aged Out   COVID-19 Vaccine  Discontinued    Advanced directives: Yes; Please bring a copy of your health care power of attorney and living will to the office at your convenience.  Conditions/risks identified: Yes  Next appointment: Follow up in one year for your annual wellness visit.   Preventive Care 20 Years and Older, Male  Preventive care refers to lifestyle choices and visits with your health care provider that can promote health and wellness. What does preventive care include? A yearly physical exam. This is also called an annual well check. Dental exams once or twice a year. Routine eye exams. Ask your health care provider how often you should have your eyes checked. Personal lifestyle choices, including: Daily care of your teeth and gums. Regular physical activity. Eating a healthy diet. Avoiding tobacco and drug use. Limiting alcohol use. Practicing safe sex. Taking low doses of aspirin every day. Taking vitamin and mineral supplements as recommended by your health care provider. What happens during an annual well check? The  services and screenings done by your health care provider during your annual well check will depend on your age, overall health, lifestyle risk factors, and family history of disease. Counseling  Your health care provider may ask you questions about your: Alcohol use. Tobacco use. Drug use. Emotional well-being. Home and relationship well-being. Sexual activity. Eating habits. History of falls. Memory and ability to understand (cognition). Work and work Statistician. Screening  You may have the following tests or measurements: Height, weight, and BMI. Blood pressure. Lipid and cholesterol levels. These may be checked every 5 years, or more frequently if you are over 60 years old. Skin check. Lung cancer screening. You may have this screening every year starting at age 60 if you have a 30-pack-year history of smoking and currently smoke or have quit within the past 15 years. Fecal occult blood test (FOBT) of the stool. You may have this test every year starting at age 71. Flexible sigmoidoscopy or colonoscopy. You may have a sigmoidoscopy every 5 years or a colonoscopy every 10 years starting at age 83. Prostate cancer screening. Recommendations will vary depending on your family history and other risks. Hepatitis C blood test. Hepatitis B blood test. Sexually transmitted disease (STD) testing. Diabetes screening. This is done by checking your blood sugar (glucose) after you have not eaten for a while (fasting). You may have this done every 1-3 years. Abdominal aortic aneurysm (AAA) screening. You may need this if you  are a current or former smoker. Osteoporosis. You may be screened starting at age 37 if you are at high risk. Talk with your health care provider about your test results, treatment options, and if necessary, the need for more tests. Vaccines  Your health care provider may recommend certain vaccines, such as: Influenza vaccine. This is recommended every year. Tetanus,  diphtheria, and acellular pertussis (Tdap, Td) vaccine. You may need a Td booster every 10 years. Zoster vaccine. You may need this after age 26. Pneumococcal 13-valent conjugate (PCV13) vaccine. One dose is recommended after age 3. Pneumococcal polysaccharide (PPSV23) vaccine. One dose is recommended after age 58. Talk to your health care provider about which screenings and vaccines you need and how often you need them. This information is not intended to replace advice given to you by your health care provider. Make sure you discuss any questions you have with your health care provider. Document Released: 12/12/2015 Document Revised: 08/04/2016 Document Reviewed: 09/16/2015 Elsevier Interactive Patient Education  2017 [place] Prevention in the Home Falls can cause injuries. They can happen to people of all ages. There are many things you can do to make your home safe and to help prevent falls. What can I do on the outside of my home? Regularly fix the edges of walkways and driveways and fix any cracks. Remove anything that might make you trip as you walk through a door, such as a raised step or threshold. Trim any bushes or trees on the path to your home. Use bright outdoor lighting. Clear any walking paths of anything that might make someone trip, such as rocks or tools. Regularly check to see if handrails are loose or broken. Make sure that both sides of any steps have handrails. Any raised decks and porches should have guardrails on the edges. Have any leaves, snow, or ice cleared regularly. Use sand or salt on walking paths during winter. Clean up any spills in your garage right away. This includes oil or grease spills. What can I do in the bathroom? Use night lights. Install grab bars by the toilet and in the tub and shower. Do not use towel bars as grab bars. Use non-skid mats or decals in the tub or shower. If you need to sit down in the shower, use a plastic,  non-slip stool. Keep the floor dry. Clean up any water that spills on the floor as soon as it happens. Remove soap buildup in the tub or shower regularly. Attach bath mats securely with double-sided non-slip rug tape. Do not have throw rugs and other things on the floor that can make you trip. What can I do in the bedroom? Use night lights. Make sure that you have a light by your bed that is easy to reach. Do not use any sheets or blankets that are too big for your bed. They should not hang down onto the floor. Have a firm chair that has side arms. You can use this for support while you get dressed. Do not have throw rugs and other things on the floor that can make you trip. What can I do in the kitchen? Clean up any spills right away. Avoid walking on wet floors. Keep items that you use a lot in easy-to-reach places. If you need to reach something above you, use a strong step stool that has a grab bar. Keep electrical cords out of the way. Do not use floor polish or wax that makes floors slippery. If you  must use wax, use non-skid floor wax. Do not have throw rugs and other things on the floor that can make you trip. What can I do with my stairs? Do not leave any items on the stairs. Make sure that there are handrails on both sides of the stairs and use them. Fix handrails that are broken or loose. Make sure that handrails are as long as the stairways. Check any carpeting to make sure that it is firmly attached to the stairs. Fix any carpet that is loose or worn. Avoid having throw rugs at the top or bottom of the stairs. If you do have throw rugs, attach them to the floor with carpet tape. Make sure that you have a light switch at the top of the stairs and the bottom of the stairs. If you do not have them, ask someone to add them for you. What else can I do to help prevent falls? Wear shoes that: Do not have high heels. Have rubber bottoms. Are comfortable and fit you well. Are closed  at the toe. Do not wear sandals. If you use a stepladder: Make sure that it is fully opened. Do not climb a closed stepladder. Make sure that both sides of the stepladder are locked into place. Ask someone to hold it for you, if possible. Clearly mark and make sure that you can see: Any grab bars or handrails. First and last steps. Where the edge of each step is. Use tools that help you move around (mobility aids) if they are needed. These include: Canes. Walkers. Scooters. Crutches. Turn on the lights when you go into a dark area. Replace any light bulbs as soon as they burn out. Set up your furniture so you have a clear path. Avoid moving your furniture around. If any of your floors are uneven, fix them. If there are any pets around you, be aware of where they are. Review your medicines with your doctor. Some medicines can make you feel dizzy. This can increase your chance of falling. Ask your doctor what other things that you can do to help prevent falls. This information is not intended to replace advice given to you by your health care provider. Make sure you discuss any questions you have with your health care provider. Document Released: 09/11/2009 Document Revised: 04/22/2016 Document Reviewed: 12/20/2014 Elsevier Interactive Patient Education  2017 [employer].

## 2022-09-21 ENCOUNTER — Other Ambulatory Visit: Payer: Self-pay | Admitting: Internal Medicine

## 2022-09-21 DIAGNOSIS — M79642 Pain in left hand: Secondary | ICD-10-CM | POA: Diagnosis not present

## 2022-09-21 DIAGNOSIS — I83892 Varicose veins of left lower extremities with other complications: Secondary | ICD-10-CM | POA: Diagnosis not present

## 2022-09-21 DIAGNOSIS — M79641 Pain in right hand: Secondary | ICD-10-CM | POA: Diagnosis not present

## 2022-09-21 DIAGNOSIS — M65342 Trigger finger, left ring finger: Secondary | ICD-10-CM | POA: Diagnosis not present

## 2022-09-21 DIAGNOSIS — E118 Type 2 diabetes mellitus with unspecified complications: Secondary | ICD-10-CM

## 2022-09-22 ENCOUNTER — Other Ambulatory Visit: Payer: Self-pay | Admitting: Internal Medicine

## 2022-09-22 ENCOUNTER — Encounter: Payer: Self-pay | Admitting: Internal Medicine

## 2022-09-22 DIAGNOSIS — E118 Type 2 diabetes mellitus with unspecified complications: Secondary | ICD-10-CM | POA: Insufficient documentation

## 2022-09-22 DIAGNOSIS — I10 Essential (primary) hypertension: Secondary | ICD-10-CM

## 2022-09-22 MED ORDER — TIRZEPATIDE 5 MG/0.5ML ~~LOC~~ SOAJ: 5.0000 mg | SUBCUTANEOUS | 0 refills | Status: DC

## 2022-10-01 DIAGNOSIS — Z09 Encounter for follow-up examination after completed treatment for conditions other than malignant neoplasm: Secondary | ICD-10-CM | POA: Diagnosis not present

## 2022-10-01 DIAGNOSIS — I83812 Varicose veins of left lower extremities with pain: Secondary | ICD-10-CM | POA: Diagnosis not present

## 2022-10-06 ENCOUNTER — Encounter: Payer: Self-pay | Admitting: Internal Medicine

## 2022-10-06 DIAGNOSIS — M545 Low back pain, unspecified: Secondary | ICD-10-CM | POA: Diagnosis not present

## 2022-10-06 DIAGNOSIS — G5613 Other lesions of median nerve, bilateral upper limbs: Secondary | ICD-10-CM | POA: Diagnosis not present

## 2022-10-11 DIAGNOSIS — H401131 Primary open-angle glaucoma, bilateral, mild stage: Secondary | ICD-10-CM | POA: Diagnosis not present

## 2022-10-11 DIAGNOSIS — H2513 Age-related nuclear cataract, bilateral: Secondary | ICD-10-CM | POA: Diagnosis not present

## 2022-10-11 DIAGNOSIS — H524 Presbyopia: Secondary | ICD-10-CM | POA: Diagnosis not present

## 2022-10-11 DIAGNOSIS — H43813 Vitreous degeneration, bilateral: Secondary | ICD-10-CM | POA: Diagnosis not present

## 2022-10-11 DIAGNOSIS — H25013 Cortical age-related cataract, bilateral: Secondary | ICD-10-CM | POA: Diagnosis not present

## 2022-10-12 DIAGNOSIS — G5603 Carpal tunnel syndrome, bilateral upper limbs: Secondary | ICD-10-CM | POA: Diagnosis not present

## 2022-10-13 DIAGNOSIS — M545 Low back pain, unspecified: Secondary | ICD-10-CM | POA: Diagnosis not present

## 2022-10-13 DIAGNOSIS — I87392 Chronic venous hypertension (idiopathic) with other complications of left lower extremity: Secondary | ICD-10-CM | POA: Diagnosis not present

## 2022-10-13 DIAGNOSIS — I83892 Varicose veins of left lower extremities with other complications: Secondary | ICD-10-CM | POA: Diagnosis not present

## 2022-10-25 ENCOUNTER — Other Ambulatory Visit: Payer: Self-pay | Admitting: Internal Medicine

## 2022-10-25 DIAGNOSIS — E118 Type 2 diabetes mellitus with unspecified complications: Secondary | ICD-10-CM

## 2022-10-25 DIAGNOSIS — G5603 Carpal tunnel syndrome, bilateral upper limbs: Secondary | ICD-10-CM | POA: Diagnosis not present

## 2022-10-25 DIAGNOSIS — M545 Low back pain, unspecified: Secondary | ICD-10-CM | POA: Diagnosis not present

## 2022-10-25 MED ORDER — TIRZEPATIDE 7.5 MG/0.5ML ~~LOC~~ SOAJ: 7.5000 mg | SUBCUTANEOUS | 0 refills | Status: DC

## 2022-10-27 DIAGNOSIS — M7989 Other specified soft tissue disorders: Secondary | ICD-10-CM | POA: Diagnosis not present

## 2022-10-27 DIAGNOSIS — I83812 Varicose veins of left lower extremities with pain: Secondary | ICD-10-CM | POA: Diagnosis not present

## 2022-10-27 DIAGNOSIS — I83892 Varicose veins of left lower extremities with other complications: Secondary | ICD-10-CM | POA: Diagnosis not present

## 2022-11-03 ENCOUNTER — Ambulatory Visit: Payer: PPO | Admitting: Podiatry

## 2022-11-03 ENCOUNTER — Encounter: Payer: Self-pay | Admitting: Podiatry

## 2022-11-03 DIAGNOSIS — G629 Polyneuropathy, unspecified: Secondary | ICD-10-CM | POA: Diagnosis not present

## 2022-11-03 DIAGNOSIS — M545 Low back pain, unspecified: Secondary | ICD-10-CM | POA: Diagnosis not present

## 2022-11-03 NOTE — Progress Notes (Signed)
Subjective:   Patient ID: Lee Lee, male   DOB: 71 y.o.   MRN: [id number]   HPI Patient states the tingling and burning in his feet are getting worse and also has had some instability issues with some falls.  He states he has been to neurologist has been to vascular doctors no one can identify a cause of this and it is getting worse and affecting his balance along with the pain.  He has tried Lyrica and gabapentin in the past   ROS      Objective:  Physical Exam  Vascular status was found to be intact neurological diminished sharp dull vibratory bilateral with range of motion adequate.  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  At this point I do think Qutenza could be a good option for him and he may also have to consider a spinal implant and stimulator and I discussed that with him.  Will try to get him approved for Qutenza which I do think offers the best chance and also I am sending him for a hide vitamin and alpha lipoic acid combination

## 2022-11-08 ENCOUNTER — Encounter: Payer: Self-pay | Admitting: Podiatry

## 2022-11-08 DIAGNOSIS — H401131 Primary open-angle glaucoma, bilateral, mild stage: Secondary | ICD-10-CM | POA: Diagnosis not present

## 2022-11-10 DIAGNOSIS — I83892 Varicose veins of left lower extremities with other complications: Secondary | ICD-10-CM | POA: Diagnosis not present

## 2022-11-10 DIAGNOSIS — M545 Low back pain, unspecified: Secondary | ICD-10-CM | POA: Diagnosis not present

## 2022-11-10 DIAGNOSIS — I83812 Varicose veins of left lower extremities with pain: Secondary | ICD-10-CM | POA: Diagnosis not present

## 2022-11-16 ENCOUNTER — Telehealth: Payer: Self-pay | Admitting: *Deleted

## 2022-11-16 NOTE — Telephone Encounter (Signed)
Patient is calling to speak with Caryl Pina, was given the information that his insurance will not cover the [place], verbalized understanding,wants to know the cost out of pocket, please call.

## 2022-11-17 DIAGNOSIS — M545 Low back pain, unspecified: Secondary | ICD-10-CM | POA: Diagnosis not present

## 2022-11-17 NOTE — Telephone Encounter (Signed)
Mychart message was sent to the patient about the cost of the Qutenza

## 2022-11-18 DIAGNOSIS — M79641 Pain in right hand: Secondary | ICD-10-CM | POA: Diagnosis not present

## 2022-11-18 DIAGNOSIS — M65342 Trigger finger, left ring finger: Secondary | ICD-10-CM | POA: Diagnosis not present

## 2022-11-18 DIAGNOSIS — M79642 Pain in left hand: Secondary | ICD-10-CM | POA: Diagnosis not present

## 2022-11-18 DIAGNOSIS — G5603 Carpal tunnel syndrome, bilateral upper limbs: Secondary | ICD-10-CM | POA: Diagnosis not present

## 2022-11-25 ENCOUNTER — Other Ambulatory Visit: Payer: Self-pay | Admitting: Internal Medicine

## 2022-11-25 DIAGNOSIS — F411 Generalized anxiety disorder: Secondary | ICD-10-CM

## 2022-11-30 ENCOUNTER — Other Ambulatory Visit: Payer: Self-pay | Admitting: Internal Medicine

## 2022-11-30 ENCOUNTER — Encounter: Payer: Self-pay | Admitting: Internal Medicine

## 2022-11-30 DIAGNOSIS — E118 Type 2 diabetes mellitus with unspecified complications: Secondary | ICD-10-CM

## 2022-12-01 ENCOUNTER — Other Ambulatory Visit: Payer: PPO

## 2022-12-01 ENCOUNTER — Encounter: Payer: Self-pay | Admitting: Internal Medicine

## 2022-12-01 DIAGNOSIS — M545 Low back pain, unspecified: Secondary | ICD-10-CM | POA: Diagnosis not present

## 2022-12-01 MED ORDER — DESVENLAFAXINE SUCCINATE ER 50 MG PO TB24: 50.0000 mg | ORAL_TABLET | Freq: Every day | ORAL | 0 refills | Status: DC

## 2022-12-01 MED ORDER — TIRZEPATIDE 7.5 MG/0.5ML ~~LOC~~ SOAJ: 7.5000 mg | SUBCUTANEOUS | 0 refills | Status: DC

## 2022-12-04 ENCOUNTER — Other Ambulatory Visit: Payer: Self-pay | Admitting: Internal Medicine

## 2022-12-04 DIAGNOSIS — G2581 Restless legs syndrome: Secondary | ICD-10-CM

## 2022-12-07 ENCOUNTER — Other Ambulatory Visit: Payer: Self-pay | Admitting: Internal Medicine

## 2022-12-07 DIAGNOSIS — I83891 Varicose veins of right lower extremities with other complications: Secondary | ICD-10-CM | POA: Diagnosis not present

## 2022-12-07 DIAGNOSIS — I87391 Chronic venous hypertension (idiopathic) with other complications of right lower extremity: Secondary | ICD-10-CM | POA: Diagnosis not present

## 2022-12-07 MED ORDER — ROPINIROLE HCL ER 4 MG PO TB24: 4.0000 mg | ORAL_TABLET | Freq: Every day | ORAL | 1 refills | Status: DC

## 2022-12-08 ENCOUNTER — Ambulatory Visit: Payer: PPO | Attending: Family Medicine

## 2022-12-08 ENCOUNTER — Encounter: Payer: Self-pay | Admitting: Family Medicine

## 2022-12-08 ENCOUNTER — Ambulatory Visit (INDEPENDENT_AMBULATORY_CARE_PROVIDER_SITE_OTHER): Payer: PPO | Admitting: Family Medicine

## 2022-12-08 VITALS — BP 142/80 | HR 88 | Temp 97.6°F | Ht 71.0 in | Wt 223.0 lb

## 2022-12-08 DIAGNOSIS — E118 Type 2 diabetes mellitus with unspecified complications: Secondary | ICD-10-CM | POA: Diagnosis not present

## 2022-12-08 DIAGNOSIS — R002 Palpitations: Secondary | ICD-10-CM

## 2022-12-08 DIAGNOSIS — R634 Abnormal weight loss: Secondary | ICD-10-CM | POA: Diagnosis not present

## 2022-12-08 DIAGNOSIS — T50905A Adverse effect of unspecified drugs, medicaments and biological substances, initial encounter: Secondary | ICD-10-CM | POA: Diagnosis not present

## 2022-12-08 DIAGNOSIS — R9431 Abnormal electrocardiogram [ECG] [EKG]: Secondary | ICD-10-CM

## 2022-12-08 DIAGNOSIS — D508 Other iron deficiency anemias: Secondary | ICD-10-CM

## 2022-12-08 DIAGNOSIS — Z8679 Personal history of other diseases of the circulatory system: Secondary | ICD-10-CM

## 2022-12-08 DIAGNOSIS — M545 Low back pain, unspecified: Secondary | ICD-10-CM | POA: Diagnosis not present

## 2022-12-08 LAB — CBC WITH DIFFERENTIAL/PLATELET
Basophils Absolute: 0 10*3/uL (ref 0.0–0.1)
Basophils Relative: 0.6 % (ref 0.0–3.0)
Eosinophils Absolute: 0.1 10*3/uL (ref 0.0–0.7)
Eosinophils Relative: 0.9 % (ref 0.0–5.0)
HCT: 51 % (ref 39.0–52.0)
Hemoglobin: 17.1 g/dL — ABNORMAL HIGH (ref 13.0–17.0)
Lymphocytes Relative: 11.1 % — ABNORMAL LOW (ref 12.0–46.0)
Lymphs Abs: 0.8 10*3/uL (ref 0.7–4.0)
MCHC: 33.5 g/dL (ref 30.0–36.0)
MCV: 99.1 fl (ref 78.0–100.0)
Monocytes Absolute: 0.5 10*3/uL (ref 0.1–1.0)
Monocytes Relative: 7.5 % (ref 3.0–12.0)
Neutro Abs: 5.7 10*3/uL (ref 1.4–7.7)
Neutrophils Relative %: 79.9 % — ABNORMAL HIGH (ref 43.0–77.0)
Platelets: 146 10*3/uL — ABNORMAL LOW (ref 150.0–400.0)
RBC: 5.15 Mil/uL (ref 4.22–5.81)
RDW: 15.9 % — ABNORMAL HIGH (ref 11.5–15.5)
WBC: 7.1 10*3/uL (ref 4.0–10.5)

## 2022-12-08 LAB — COMPREHENSIVE METABOLIC PANEL
ALT: 27 U/L (ref 0–53)
AST: 29 U/L (ref 0–37)
Albumin: 4.5 g/dL (ref 3.5–5.2)
Alkaline Phosphatase: 84 U/L (ref 39–117)
BUN: 17 mg/dL (ref 6–23)
CO2: 29 mEq/L (ref 19–32)
Calcium: 10.2 mg/dL (ref 8.4–10.5)
Chloride: 103 mEq/L (ref 96–112)
Creatinine, Ser: 0.92 mg/dL (ref 0.40–1.50)
GFR: 84.59 mL/min (ref >60.00)
Glucose, Bld: 115 mg/dL — ABNORMAL HIGH (ref 70–99)
Potassium: 4.8 mEq/L (ref 3.5–5.1)
Sodium: 141 mEq/L (ref 135–145)
Total Bilirubin: 1.7 mg/dL — ABNORMAL HIGH (ref 0.2–1.2)
Total Protein: 7.6 g/dL (ref 6.0–8.3)

## 2022-12-08 LAB — HEMOGLOBIN A1C: Hgb A1c MFr Bld: 5.7 % (ref 4.6–6.5)

## 2022-12-08 LAB — TSH: TSH: 0.95 u[IU]/mL (ref 0.35–5.50)

## 2022-12-08 LAB — T4, FREE: Free T4: 0.92 ng/dL (ref 0.60–1.60)

## 2022-12-08 NOTE — Assessment & Plan Note (Signed)
EKG shows NSR, rate 79, incomplete RBBB which is not new.  3 day cardiac event monitor ordered.  Check labs and follow up with PCP

## 2022-12-08 NOTE — Patient Instructions (Signed)
Please go downstairs for labs before you leave today.  You will receive a call from [facility] in regards to the 3-day admit event monitor.  We will be in touch with your results and recommendations.

## 2022-12-08 NOTE — Assessment & Plan Note (Signed)
Taking daily oral iron. Check CBC, iron studies and follow up

## 2022-12-08 NOTE — Progress Notes (Unsigned)
Enrolled for [employer] to mail a ZIO XT long term holter monitor to the patients address on file.   DOD to read. 

## 2022-12-08 NOTE — Assessment & Plan Note (Signed)
Continue Mounjaro. Consider reducing dose. He has lost >30 lbs since October since starting this medication.  Check A1c and follow up.

## 2022-12-08 NOTE — Progress Notes (Signed)
Subjective:     Patient ID: Lee Lee, male    DOB: 1950/12/11, 72 y.o.   MRN: [id number]  Chief Complaint  Patient presents with   Follow-up    3 month f/u, bloodwork (A1c?)    HPI Patient is in today for follow up on DM. Normally sees Dr. Ronnald Ramp.   Traveling to [facility] home the end of the month until May.   Last A1c 7.0% 08/31/2022. States he had a 40 lb weight gain after stopping alcohol in January last year and developed a sweet tooth.  Started on [place] in October and has lost 32 lbs.  Stopped naltrexone 1 wk ago.   States he has been feeling a faster heart beat than usual x 2 wks. No DOE or chest pain.   2 ablations in the past for atrial flutter.  Cardiologist is Dr. Einar Gip.   Hx of IDA. Taking oral iron daily.   Denies fever, chills, dizziness, abdominal pain, N/V/D, urinary symptoms, LE edema.   Echo in 07/2022 1. Normal LV systolic function with visual EF 55-60%. Left ventricle cavity is normal in size. Mild concentric hypertrophy of the left ventricle. Normal global wall motion. Normal diastolic filling pattern, normal LAP. Calculated EF 54%. 2. Left atrial cavity is mildly dilated at 4.4 cm. 3. Structurally normal tricuspid valve with no regurgitation. No evidence of pulmonary hypertension. 4. The aortic root is dilated. 5. no significant change compared to prior.   Health Maintenance Due  Topic Date Due   FOOT EXAM  Never done   Diabetic kidney evaluation - Urine ACR  Never done    Past Medical History:  Diagnosis Date   Alcohol abuse, episodic drinking behavior    Anxiety    Arthritis    back & knees   Atrial flutter ([place])    s/p RFCA 01/05/13   Calcium oxalate renal stones    Complication of anesthesia    makes him loopy   Depression    ED (erectile dysfunction)    Hemorrhoids    Hypertension     Past Surgical History:  Procedure Laterality Date   ABLATION OF DYSRHYTHMIC FOCUS  01/05/2013   ARTHROSCOPIC REPAIR ACL     ATRIAL  FLUTTER ABLATION N/A 01/05/2013   Procedure: ATRIAL FLUTTER ABLATION;  Surgeon: Evans Lance, MD;  Location: [facility] CATH LAB;  Service: Cardiovascular;  Laterality: N/A;   COLONOSCOPY  11/2009   Dr. Benson Norway   ELECTROPHYSIOLOGIC STUDY N/A 07/04/2015   Procedure: A-Flutter Ablation;  Surgeon: Evans Lance, MD;  Location: [facility] CV LAB;  Service: Cardiovascular;  Laterality: N/A;   ROTATOR CUFF REPAIR     TOTAL KNEE ARTHROPLASTY Left 10/30/2019   Procedure: TOTAL KNEE ARTHROPLASTY;  Surgeon: Paralee Cancel, MD;  Location: [facility] ORS;  Service: Orthopedics;  Laterality: Left;  70 mins    Family History  Problem Relation Age of Onset   Valvular heart disease Mother    Lymphoma Mother    Restless legs syndrome Mother    Neuropathy Neg Hx     Social History   Socioeconomic History   Marital status: Married    Spouse name: Not on file   Number of children: 2   Years of education: Not on file   Highest education level: Not on file  Occupational History   Occupation: Retired  Tobacco Use   Smoking status: Former    Types: Cigars    Quit date: 2002    Years since quitting: 2.0  Passive exposure: Past   Smokeless tobacco: Current    Types: Chew   Tobacco comments:    chews tobacco when playing golf only  Vaping Use   Vaping Use: Never used  Substance and Sexual Activity   Alcohol use: Not Currently   Drug use: No   Sexual activity: Yes    Partners: Female  Other Topics Concern   Not on file  Social History Narrative   Not on file   Social Determinants of Health   Financial Resource Strain: Low Risk  (09/20/2022)   Overall Financial Resource Strain (CARDIA)    Difficulty of Paying Living Expenses: Not hard at all  Food Insecurity: No Food Insecurity (09/20/2022)   Hunger Vital Sign    Worried About Running Out of Food in the Last Year: Never true    Ran Out of Food in the Last Year: Never true  Transportation Needs: No Transportation Needs (09/20/2022)   PRAPARE -  Hydrologist (Medical): No    Lack of Transportation (Non-Medical): No  Physical Activity: Sufficiently Active (09/20/2022)   Exercise Vital Sign    Days of Exercise per Week: 5 days    Minutes of Exercise per Session: 30 min  Stress: No Stress Concern Present (09/20/2022)   Eads    Feeling of Stress : Not at all  Social Connections: Young (09/20/2022)   Social Connection and Isolation Panel [NHANES]    Frequency of Communication with Friends and Family: More than three times a week    Frequency of Social Gatherings with Friends and Family: More than three times a week    Attends Religious Services: 1 to 4 times per year    Active Member of [employer] or Organizations: Yes    Attends Music therapist: More than 4 times per year    Marital Status: Married  Human resources officer Violence: Not At Risk (09/11/2021)   Humiliation, Afraid, Rape, and Kick questionnaire    Fear of Current or Ex-Partner: No    Emotionally Abused: No    Physically Abused: No    Sexually Abused: No    Outpatient Medications Prior to Visit  Medication Sig Dispense Refill   b complex vitamins capsule Take 1 capsule by mouth daily.     busPIRone (BUSPAR) 5 MG tablet TAKE ONE TABLET BY MOUTH THREE TIMES A DAY 270 tablet 0   desvenlafaxine (PRISTIQ) 50 MG 24 hr tablet Take 1 tablet (50 mg total) by mouth daily. 90 tablet 0   losartan (COZAAR) 25 MG tablet Take 1 tablet (25 mg total) by mouth every evening. 90 tablet 3   rOPINIRole (REQUIP XL) 4 MG 24 hr tablet Take 1 tablet (4 mg total) by mouth at bedtime. 90 tablet 1   tirzepatide (MOUNJARO) 7.5 MG/0.5ML Pen Inject 7.5 mg into the skin once a week. 6 mL 0   traZODone (DESYREL) 150 MG tablet Take 1 tablet (150 mg total) by mouth at bedtime as needed for sleep. 90 tablet 1   naltrexone (DEPADE) 50 MG tablet TAKE ONE TABLET BY MOUTH DAILY (Patient  not taking: Reported on 12/08/2022) 90 tablet 1   No facility-administered medications prior to visit.    No Known Allergies  ROS     Objective:    Physical Exam Constitutional:      General: He is not in acute distress.    Appearance: He is not ill-appearing.  Eyes:  Conjunctiva/sclera: Conjunctivae normal.  Cardiovascular:     Rate and Rhythm: Normal rate and regular rhythm.  Pulmonary:     Effort: Pulmonary effort is normal.     Breath sounds: Normal breath sounds.  Musculoskeletal:     Cervical back: Normal range of motion.     Right lower leg: No edema.     Left lower leg: No edema.  Skin:    General: Skin is warm and dry.  Neurological:     General: No focal deficit present.     Mental Status: He is alert and oriented to person, place, and time.  Psychiatric:        Mood and Affect: Mood normal.        Behavior: Behavior normal.        Thought Content: Thought content normal.     BP (!) 142/80 (BP Location: Left Arm, Patient Position: Sitting, Cuff Size: Large)   Pulse 88   Temp 97.6 F (36.4 C) (Temporal)   Ht '5\' 11"'$  (1.803 m)   Wt 223 lb (101.2 kg)   SpO2 97%   BMI 31.10 kg/m  Wt Readings from Last 3 Encounters:  12/08/22 223 lb (101.2 kg)  09/06/22 248 lb (112.5 kg)  08/31/22 247 lb (112 kg)       Assessment & Plan:   Problem List Items Addressed This Visit       Endocrine   Type II diabetes mellitus with manifestations ([place]) - Primary    Continue Mounjaro. Consider reducing dose. He has lost >30 lbs since October since starting this medication.  Check A1c and follow up.       Relevant Orders   CBC with Differential/Platelet (Completed)   Comprehensive metabolic panel (Completed)   Hemoglobin A1c (Completed)     Other   Abnormal electrocardiogram (ECG) (EKG)    EKG shows NSR, rate 79, incomplete RBBB which is not new.  3 day cardiac event monitor ordered.  Check labs and follow up with PCP      Relevant Orders   LONG TERM  MONITOR (3-14 DAYS)   Iron deficiency anemia    Taking daily oral iron. Check CBC, iron studies and follow up      Relevant Orders   CBC with Differential/Platelet (Completed)   Iron, TIBC and Ferritin Panel   Palpitations    EKG shows NSR, rate 79, incomplete RBBB which is not new.  3 day cardiac event monitor ordered.  Check labs and follow up with PCP      Relevant Orders   CBC with Differential/Platelet (Completed)   Comprehensive metabolic panel (Completed)   TSH (Completed)   T4, free (Completed)   EKG 12-Lead   LONG TERM MONITOR (3-14 DAYS)   Other Visit Diagnoses     Weight loss due to medication       History of atrial flutter       Relevant Orders   LONG TERM MONITOR (3-14 DAYS)        I have discontinued Lillette Boxer. [place]'s naltrexone. I am also having him maintain his b complex vitamins, traZODone, losartan, busPIRone, tirzepatide, desvenlafaxine, and rOPINIRole.  No orders of the defined types were placed in this encounter.

## 2022-12-09 ENCOUNTER — Telehealth: Payer: Self-pay

## 2022-12-09 LAB — IRON,TIBC AND FERRITIN PANEL
%SAT: 97 % (calc) — ABNORMAL HIGH (ref 20–48)
Ferritin: 170 ng/mL (ref 24–380)
Iron: 295 ug/dL — ABNORMAL HIGH (ref 50–180)
TIBC: 305 mcg/dL (calc) (ref 250–425)

## 2022-12-09 NOTE — Progress Notes (Signed)
Please make sure he receives the following information:  Your iron level is now elevated. Please stop taking the oral iron supplement. Your iron level will need to be rechecked in approximately 4 weeks to make sure this is trending back to normal. Also, your bilirubin is elevated and your platelets are low suggesting you may have underlying liver disease. I recommend also having these labs rechecked in 4 weeks or so. Your hemoglobin A1c (diabetes test) is now back in prediabetes range and looks good. If you have any questions, let us know.

## 2022-12-09 NOTE — Telephone Encounter (Signed)
Spoke with the pt and he has sched an appointment with the lab for 12/27/2022 before he leaves for 3 months. Pt asked that you order labs so when he come in he can have them done.

## 2022-12-10 ENCOUNTER — Ambulatory Visit: Payer: PPO | Admitting: Podiatry

## 2022-12-10 ENCOUNTER — Encounter: Payer: Self-pay | Admitting: Neurology

## 2022-12-10 DIAGNOSIS — Z8679 Personal history of other diseases of the circulatory system: Secondary | ICD-10-CM | POA: Diagnosis not present

## 2022-12-10 DIAGNOSIS — R002 Palpitations: Secondary | ICD-10-CM | POA: Diagnosis not present

## 2022-12-10 DIAGNOSIS — R9431 Abnormal electrocardiogram [ECG] [EKG]: Secondary | ICD-10-CM | POA: Diagnosis not present

## 2022-12-14 DIAGNOSIS — M79641 Pain in right hand: Secondary | ICD-10-CM | POA: Diagnosis not present

## 2022-12-14 DIAGNOSIS — G5603 Carpal tunnel syndrome, bilateral upper limbs: Secondary | ICD-10-CM | POA: Diagnosis not present

## 2022-12-14 DIAGNOSIS — M79642 Pain in left hand: Secondary | ICD-10-CM | POA: Diagnosis not present

## 2022-12-14 DIAGNOSIS — M65342 Trigger finger, left ring finger: Secondary | ICD-10-CM | POA: Diagnosis not present

## 2022-12-17 DIAGNOSIS — R002 Palpitations: Secondary | ICD-10-CM | POA: Diagnosis not present

## 2022-12-17 DIAGNOSIS — R9431 Abnormal electrocardiogram [ECG] [EKG]: Secondary | ICD-10-CM | POA: Diagnosis not present

## 2022-12-20 NOTE — Progress Notes (Signed)
Please reach out to him and ask him to follow up with his cardiologist regarding the findings of this study. Let me know if he has any questions.

## 2022-12-23 DIAGNOSIS — M545 Low back pain, unspecified: Secondary | ICD-10-CM | POA: Diagnosis not present

## 2022-12-27 ENCOUNTER — Other Ambulatory Visit: Payer: Self-pay | Admitting: Internal Medicine

## 2022-12-27 ENCOUNTER — Other Ambulatory Visit: Payer: PPO

## 2022-12-27 DIAGNOSIS — D508 Other iron deficiency anemias: Secondary | ICD-10-CM

## 2022-12-27 DIAGNOSIS — D696 Thrombocytopenia, unspecified: Secondary | ICD-10-CM | POA: Insufficient documentation

## 2022-12-31 ENCOUNTER — Other Ambulatory Visit: Payer: Self-pay | Admitting: Internal Medicine

## 2022-12-31 ENCOUNTER — Telehealth: Payer: Self-pay | Admitting: Internal Medicine

## 2022-12-31 DIAGNOSIS — F332 Major depressive disorder, recurrent severe without psychotic features: Secondary | ICD-10-CM

## 2022-12-31 DIAGNOSIS — I77810 Thoracic aortic ectasia: Secondary | ICD-10-CM

## 2022-12-31 DIAGNOSIS — F411 Generalized anxiety disorder: Secondary | ICD-10-CM

## 2022-12-31 DIAGNOSIS — E118 Type 2 diabetes mellitus with unspecified complications: Secondary | ICD-10-CM

## 2022-12-31 DIAGNOSIS — G2581 Restless legs syndrome: Secondary | ICD-10-CM

## 2022-12-31 MED ORDER — TIRZEPATIDE 7.5 MG/0.5ML ~~LOC~~ SOAJ: 7.5000 mg | SUBCUTANEOUS | 0 refills | Status: DC

## 2022-12-31 MED ORDER — DESVENLAFAXINE SUCCINATE ER 50 MG PO TB24: 50.0000 mg | ORAL_TABLET | Freq: Every day | ORAL | 0 refills | Status: DC

## 2022-12-31 MED ORDER — ROPINIROLE HCL ER 4 MG PO TB24: 4.0000 mg | ORAL_TABLET | Freq: Every day | ORAL | 0 refills | Status: DC

## 2022-12-31 MED ORDER — BUSPIRONE HCL 5 MG PO TABS: 5.0000 mg | ORAL_TABLET | Freq: Three times a day (TID) | ORAL | 0 refills | Status: DC

## 2022-12-31 MED ORDER — TRAZODONE HCL 150 MG PO TABS: 150.0000 mg | ORAL_TABLET | Freq: Every evening | ORAL | 0 refills | Status: DC | PRN

## 2022-12-31 NOTE — Telephone Encounter (Signed)
Patient called he needs a refill on his mounjaro, this prescription and all others should be sent to the [facility] at [place], [place], [place] for the next 3 months, until May 1st.

## 2023-01-12 ENCOUNTER — Other Ambulatory Visit: Payer: Self-pay | Admitting: Neurology

## 2023-01-12 DIAGNOSIS — M545 Low back pain, unspecified: Secondary | ICD-10-CM

## 2023-01-12 DIAGNOSIS — G6289 Other specified polyneuropathies: Secondary | ICD-10-CM

## 2023-01-12 DIAGNOSIS — R200 Anesthesia of skin: Secondary | ICD-10-CM

## 2023-01-12 DIAGNOSIS — R202 Paresthesia of skin: Secondary | ICD-10-CM

## 2023-01-17 ENCOUNTER — Telehealth: Payer: Self-pay | Admitting: Neurology

## 2023-01-17 NOTE — Telephone Encounter (Signed)
Referral for pain clinic fax to [facility] in [place], [place] to see Dr., Ezequiel Ganser. Instructed fax attention to [facility]. Phone: [phone], Fax: [phone].

## 2023-01-19 IMAGING — DX DG CHEST 2V
2 series · 2 of 2 positions shown · non-contrast
Comparison: May 20, 2021.

CLINICAL DATA: Cough.

EXAM:
CHEST - 2 VIEW

[chest lat]
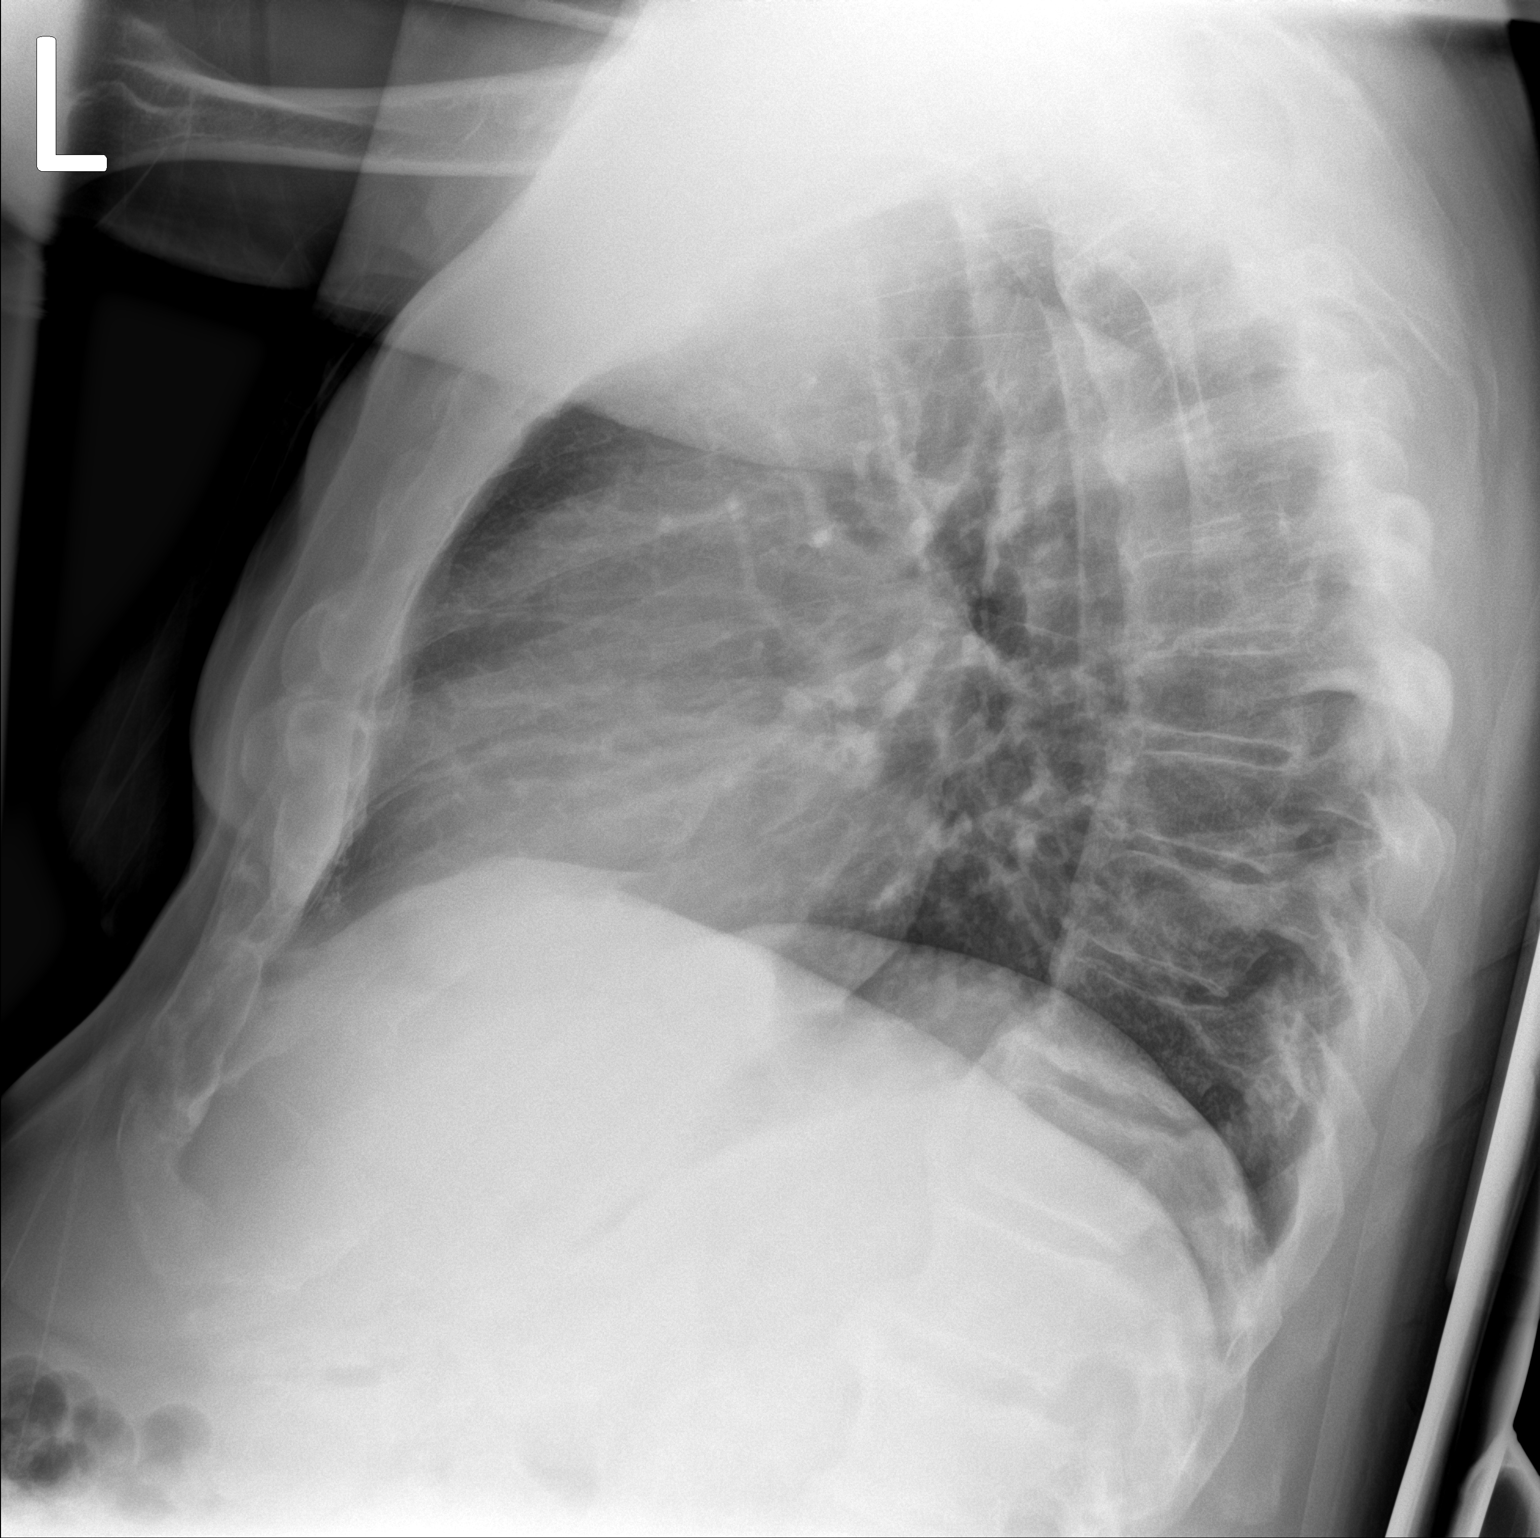

[chest ap]
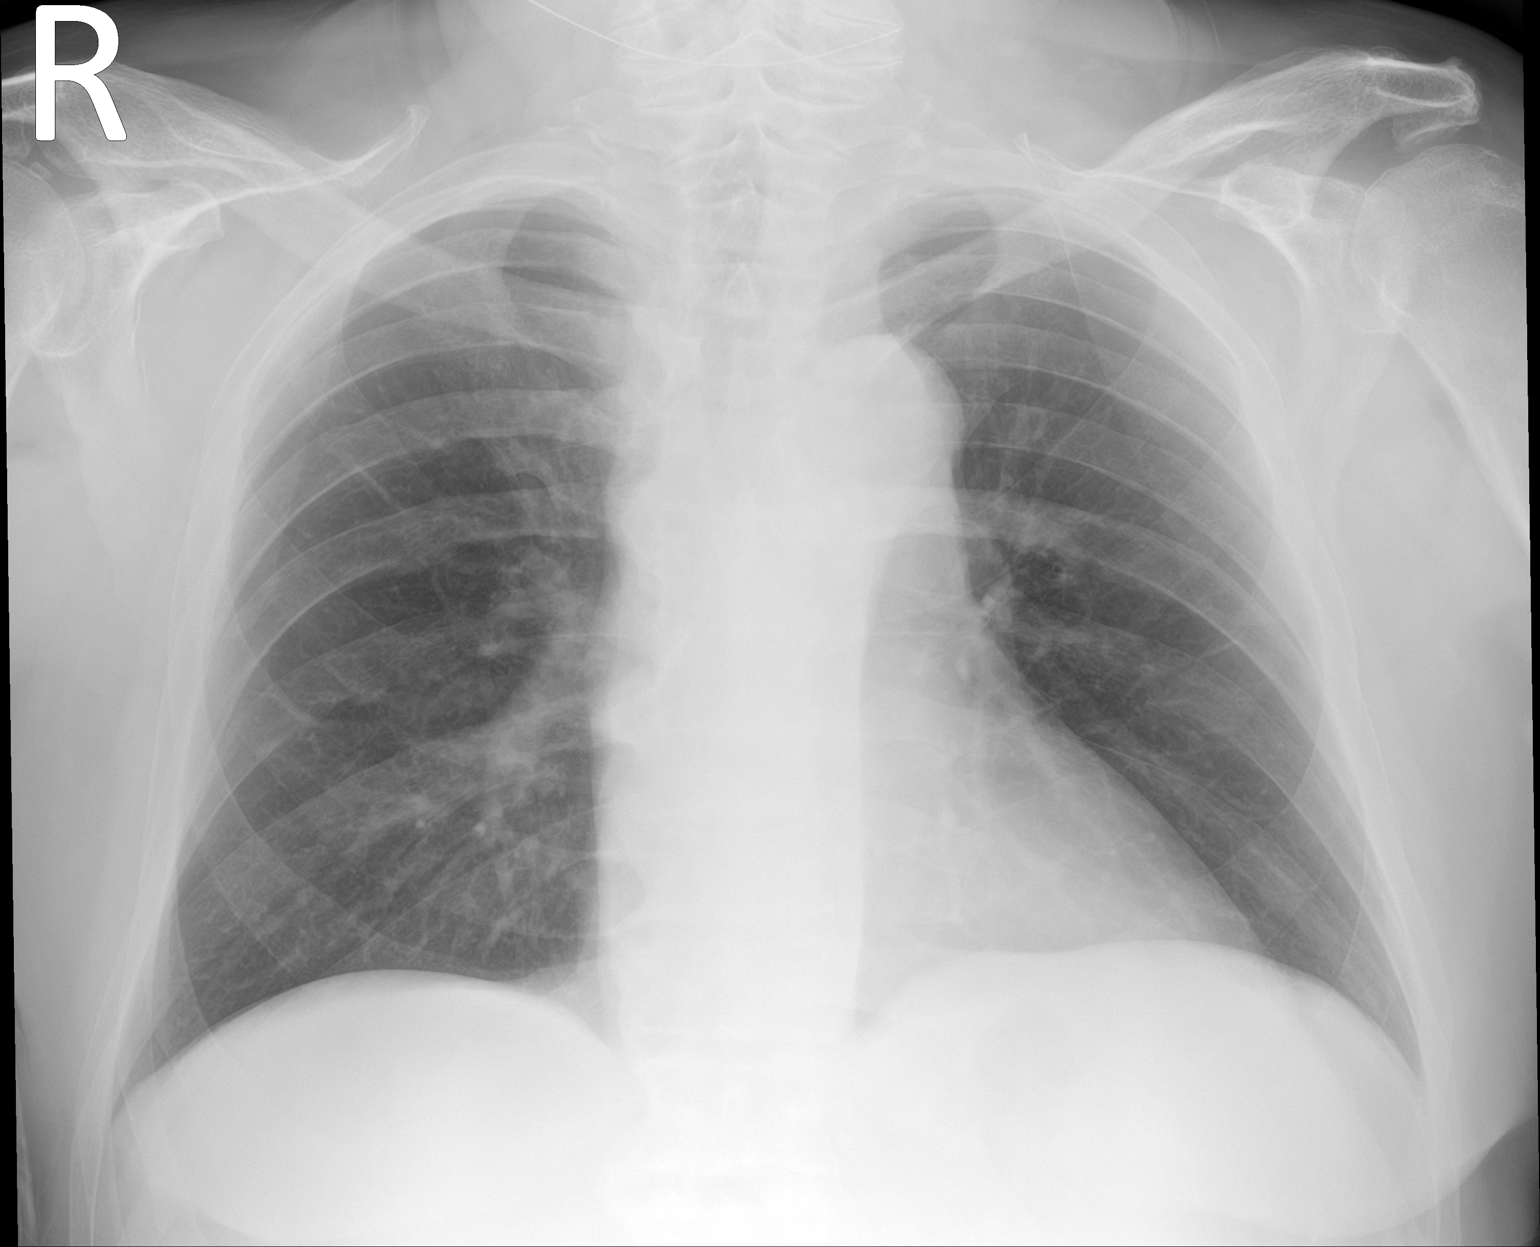

[2 of 2 positions shown; findings below may reference images not displayed]

FINDINGS: The heart size and mediastinal contours are within normal limits.
Both lungs are clear. The visualized skeletal structures are
unremarkable.
IMPRESSION: No active cardiopulmonary disease.

## 2023-01-24 ENCOUNTER — Encounter: Payer: Self-pay | Admitting: Internal Medicine

## 2023-01-25 ENCOUNTER — Other Ambulatory Visit: Payer: Self-pay | Admitting: Internal Medicine

## 2023-01-25 DIAGNOSIS — E118 Type 2 diabetes mellitus with unspecified complications: Secondary | ICD-10-CM

## 2023-01-25 MED ORDER — TIRZEPATIDE 7.5 MG/0.5ML ~~LOC~~ SOAJ: 7.5000 mg | SUBCUTANEOUS | 0 refills | Status: DC

## 2023-02-24 ENCOUNTER — Other Ambulatory Visit: Payer: Self-pay | Admitting: Internal Medicine

## 2023-02-24 DIAGNOSIS — E118 Type 2 diabetes mellitus with unspecified complications: Secondary | ICD-10-CM

## 2023-02-28 ENCOUNTER — Other Ambulatory Visit: Payer: Self-pay | Admitting: Internal Medicine

## 2023-02-28 DIAGNOSIS — F411 Generalized anxiety disorder: Secondary | ICD-10-CM

## 2023-02-28 MED ORDER — TIRZEPATIDE 7.5 MG/0.5ML ~~LOC~~ SOAJ: 7.5000 mg | SUBCUTANEOUS | 0 refills | Status: DC

## 2023-03-08 DIAGNOSIS — M5416 Radiculopathy, lumbar region: Secondary | ICD-10-CM | POA: Diagnosis not present

## 2023-03-08 DIAGNOSIS — E1140 Type 2 diabetes mellitus with diabetic neuropathy, unspecified: Secondary | ICD-10-CM | POA: Diagnosis not present

## 2023-03-22 ENCOUNTER — Encounter: Payer: Self-pay | Admitting: Neurology

## 2023-03-22 ENCOUNTER — Other Ambulatory Visit: Payer: Self-pay | Admitting: Internal Medicine

## 2023-03-22 DIAGNOSIS — G2581 Restless legs syndrome: Secondary | ICD-10-CM

## 2023-03-22 DIAGNOSIS — F332 Major depressive disorder, recurrent severe without psychotic features: Secondary | ICD-10-CM

## 2023-03-22 MED ORDER — ROPINIROLE HCL ER 4 MG PO TB24: 4.0000 mg | ORAL_TABLET | Freq: Every day | ORAL | 0 refills | Status: DC

## 2023-03-22 MED ORDER — DESVENLAFAXINE SUCCINATE ER 50 MG PO TB24: 50.0000 mg | ORAL_TABLET | Freq: Every day | ORAL | 0 refills | Status: DC

## 2023-03-30 ENCOUNTER — Ambulatory Visit: Payer: PPO | Admitting: Internal Medicine

## 2023-04-05 ENCOUNTER — Encounter: Payer: Self-pay | Admitting: Internal Medicine

## 2023-04-06 DIAGNOSIS — M545 Low back pain, unspecified: Secondary | ICD-10-CM | POA: Diagnosis not present

## 2023-04-11 DIAGNOSIS — H401131 Primary open-angle glaucoma, bilateral, mild stage: Secondary | ICD-10-CM | POA: Diagnosis not present

## 2023-04-11 LAB — HM DIABETES EYE EXAM

## 2023-04-14 DIAGNOSIS — M545 Low back pain, unspecified: Secondary | ICD-10-CM | POA: Diagnosis not present

## 2023-04-28 DIAGNOSIS — M545 Low back pain, unspecified: Secondary | ICD-10-CM | POA: Diagnosis not present

## 2023-05-02 ENCOUNTER — Encounter: Payer: Self-pay | Admitting: Internal Medicine

## 2023-05-02 ENCOUNTER — Other Ambulatory Visit: Payer: Self-pay | Admitting: Internal Medicine

## 2023-05-02 DIAGNOSIS — E118 Type 2 diabetes mellitus with unspecified complications: Secondary | ICD-10-CM

## 2023-05-10 ENCOUNTER — Ambulatory Visit (INDEPENDENT_AMBULATORY_CARE_PROVIDER_SITE_OTHER): Payer: PPO | Admitting: Internal Medicine

## 2023-05-10 ENCOUNTER — Encounter: Payer: Self-pay | Admitting: Internal Medicine

## 2023-05-10 VITALS — BP 126/76 | HR 72 | Temp 98.2°F | Resp 16 | Ht 71.0 in | Wt 221.0 lb

## 2023-05-10 DIAGNOSIS — D508 Other iron deficiency anemias: Secondary | ICD-10-CM | POA: Diagnosis not present

## 2023-05-10 DIAGNOSIS — R0609 Other forms of dyspnea: Secondary | ICD-10-CM | POA: Insufficient documentation

## 2023-05-10 DIAGNOSIS — I10 Essential (primary) hypertension: Secondary | ICD-10-CM

## 2023-05-10 DIAGNOSIS — K7581 Nonalcoholic steatohepatitis (NASH): Secondary | ICD-10-CM | POA: Diagnosis not present

## 2023-05-10 DIAGNOSIS — E538 Deficiency of other specified B group vitamins: Secondary | ICD-10-CM

## 2023-05-10 DIAGNOSIS — G2581 Restless legs syndrome: Secondary | ICD-10-CM | POA: Diagnosis not present

## 2023-05-10 DIAGNOSIS — G63 Polyneuropathy in diseases classified elsewhere: Secondary | ICD-10-CM

## 2023-05-10 DIAGNOSIS — D696 Thrombocytopenia, unspecified: Secondary | ICD-10-CM

## 2023-05-10 DIAGNOSIS — R972 Elevated prostate specific antigen [PSA]: Secondary | ICD-10-CM

## 2023-05-10 DIAGNOSIS — N400 Enlarged prostate without lower urinary tract symptoms: Secondary | ICD-10-CM | POA: Diagnosis not present

## 2023-05-10 DIAGNOSIS — E118 Type 2 diabetes mellitus with unspecified complications: Secondary | ICD-10-CM | POA: Diagnosis not present

## 2023-05-10 LAB — HEPATIC FUNCTION PANEL
ALT: 59 U/L — ABNORMAL HIGH (ref 0–53)
AST: 56 U/L — ABNORMAL HIGH (ref 0–37)
Albumin: 4.1 g/dL (ref 3.5–5.2)
Alkaline Phosphatase: 66 U/L (ref 39–117)
Bilirubin, Direct: 0.2 mg/dL (ref 0.0–0.3)
Total Bilirubin: 0.6 mg/dL (ref 0.2–1.2)
Total Protein: 6.9 g/dL (ref 6.0–8.3)

## 2023-05-10 LAB — VITAMIN B12: Vitamin B-12: 546 pg/mL (ref 211–911)

## 2023-05-10 LAB — PSA: PSA: 7.01 ng/mL — ABNORMAL HIGH (ref 0.10–4.00)

## 2023-05-10 LAB — PROTIME-INR
INR: 1.1 ratio — ABNORMAL HIGH (ref 0.8–1.0)
Prothrombin Time: 11.3 s (ref 9.6–13.1)

## 2023-05-10 LAB — IBC + FERRITIN
Ferritin: 378.1 ng/mL — ABNORMAL HIGH (ref 22.0–322.0)
Iron: 129 ug/dL (ref 42–165)
Saturation Ratios: 48 % (ref 20.0–50.0)
TIBC: 268.8 ug/dL (ref 250.0–450.0)
Transferrin: 192 mg/dL — ABNORMAL LOW (ref 212.0–360.0)

## 2023-05-10 LAB — BRAIN NATRIURETIC PEPTIDE: Pro B Natriuretic peptide (BNP): 90 pg/mL (ref 0.0–100.0)

## 2023-05-10 LAB — CBC WITH DIFFERENTIAL/PLATELET
Basophils Absolute: 0 10*3/uL (ref 0.0–0.1)
Basophils Relative: 0.5 % (ref 0.0–3.0)
Eosinophils Absolute: 0.1 10*3/uL (ref 0.0–0.7)
Eosinophils Relative: 1.6 % (ref 0.0–5.0)
HCT: 41.6 % (ref 39.0–52.0)
Hemoglobin: 13.9 g/dL (ref 13.0–17.0)
Lymphocytes Relative: 15.3 % (ref 12.0–46.0)
Lymphs Abs: 0.7 10*3/uL (ref 0.7–4.0)
MCHC: 33.5 g/dL (ref 30.0–36.0)
MCV: 106.2 fl — ABNORMAL HIGH (ref 78.0–100.0)
Monocytes Absolute: 0.4 10*3/uL (ref 0.1–1.0)
Monocytes Relative: 8.6 % (ref 3.0–12.0)
Neutro Abs: 3.5 10*3/uL (ref 1.4–7.7)
Neutrophils Relative %: 74 % (ref 43.0–77.0)
Platelets: 176 10*3/uL (ref 150.0–400.0)
RBC: 3.92 Mil/uL — ABNORMAL LOW (ref 4.22–5.81)
RDW: 13.4 % (ref 11.5–15.5)
WBC: 4.8 10*3/uL (ref 4.0–10.5)

## 2023-05-10 LAB — BASIC METABOLIC PANEL
BUN: 14 mg/dL (ref 6–23)
CO2: 28 mEq/L (ref 19–32)
Calcium: 9.2 mg/dL (ref 8.4–10.5)
Chloride: 104 mEq/L (ref 96–112)
Creatinine, Ser: 0.8 mg/dL (ref 0.40–1.50)
GFR: 89.73 mL/min (ref >60.00)
Glucose, Bld: 88 mg/dL (ref 70–99)
Potassium: 4.5 mEq/L (ref 3.5–5.1)
Sodium: 139 mEq/L (ref 135–145)

## 2023-05-10 LAB — HEMOGLOBIN A1C: Hgb A1c MFr Bld: 4.9 % (ref 4.6–6.5)

## 2023-05-10 LAB — FOLATE: Folate: 8.1 ng/mL (ref >5.9)

## 2023-05-10 LAB — TROPONIN I (HIGH SENSITIVITY): High Sens Troponin I: 5 ng/L (ref 2–17)

## 2023-05-10 MED ORDER — B COMPLEX VITAMINS PO CAPS: 1.0000 | ORAL_CAPSULE | Freq: Every day | ORAL | 1 refills | Status: AC

## 2023-05-10 MED ORDER — TIRZEPATIDE 7.5 MG/0.5ML ~~LOC~~ SOAJ: 7.5000 mg | SUBCUTANEOUS | 0 refills | Status: DC

## 2023-05-10 NOTE — Progress Notes (Signed)
Subjective:  Patient ID: Lee Lee, male    DOB: September 18, 1951  Age: 72 y.o. MRN: [id number]  CC: Hypertension and Diabetes   HPI MIKIE CAVENY presents for f/up ---  He complains of a 3-week history of intermittent DOE.  He denies chest pain, diaphoresis, palpitations, or edema.  Outpatient Medications Prior to Visit  Medication Sig Dispense Refill   busPIRone (BUSPAR) 5 MG tablet TAKE ONE TABLET BY MOUTH THREE TIMES A DAY 270 tablet 0   desvenlafaxine (PRISTIQ) 50 MG 24 hr tablet Take 1 tablet (50 mg total) by mouth daily. 90 tablet 0   losartan (COZAAR) 25 MG tablet Take 1 tablet (25 mg total) by mouth every evening. 90 tablet 3   rOPINIRole (REQUIP XL) 4 MG 24 hr tablet Take 1 tablet (4 mg total) by mouth at bedtime. 90 tablet 0   traZODone (DESYREL) 150 MG tablet Take 1 tablet (150 mg total) by mouth at bedtime as needed for sleep. 90 tablet 0   b complex vitamins capsule Take 1 capsule by mouth daily.     tirzepatide (MOUNJARO) 7.5 MG/0.5ML Pen Inject 7.5 mg into the skin once a week. 6 mL 0   No facility-administered medications prior to visit.    ROS Review of Systems  Objective:  BP 126/76 (BP Location: Right Arm, Patient Position: Sitting, Cuff Size: Large)   Pulse 72   Temp 98.2 F (36.8 C) (Oral)   Resp 16   Ht 5\' 11"  (1.803 m)   Wt 221 lb (100.2 kg)   SpO2 97%   BMI 30.82 kg/m   BP Readings from Last 3 Encounters:  05/10/23 126/76  12/08/22 (!) 142/80  09/06/22 (!) 174/81    Wt Readings from Last 3 Encounters:  05/10/23 221 lb (100.2 kg)  12/08/22 223 lb (101.2 kg)  09/06/22 248 lb (112.5 kg)    Physical Exam Cardiovascular:     Rate and Rhythm: Normal rate and regular rhythm.     Heart sounds: Normal heart sounds, S1 normal and S2 normal.     Comments: EKG- NSR, 66 bpm T wave changes in III, V1, V2 are not new No LVH or Q waves Abdominal:     Hernia: There is no hernia in the left inguinal area or right inguinal area.   Genitourinary:    Pubic Area: No rash.      Penis: Normal and circumcised.      Testes: Normal.     Epididymis:     Right: Normal.     Left: Normal.     Prostate: Enlarged. Not tender and no nodules present.     Rectum: Normal. Guaiac result negative. No mass, tenderness, anal fissure, external hemorrhoid or internal hemorrhoid. Normal anal tone.  Musculoskeletal:     Right lower leg: No edema.     Left lower leg: No edema.  Lymphadenopathy:     Lower Body: No right inguinal adenopathy. No left inguinal adenopathy.     Lab Results  Component Value Date   WBC 4.8 05/10/2023   HGB 13.9 05/10/2023   HCT 41.6 05/10/2023   PLT 176.0 05/10/2023   GLUCOSE 88 05/10/2023   CHOL 93 08/31/2022   TRIG 66.0 08/31/2022   HDL 43.90 08/31/2022   LDLDIRECT 101.0 12/26/2018   LDLCALC 36 08/31/2022   ALT 59 (H) 05/10/2023   AST 56 (H) 05/10/2023   NA 139 05/10/2023   K 4.5 05/10/2023   CL 104 05/10/2023   CREATININE 0.80 05/10/2023  BUN 14 05/10/2023   CO2 28 05/10/2023   TSH 0.95 12/08/2022   PSA 7.01 (H) 05/10/2023   INR 1.1 (H) 05/10/2023   HGBA1C 4.9 05/10/2023    MR LUMBAR SPINE WO CONTRAST  Result Date: 07/23/2022 [facility] NEUROIMAG[facility] REPORT STUDY DATE: 07/20/22 PATIENT NAME: DEMARCUS KINDIG DOB: 03-23-1951 MRN: [id number] ORDERING CLINICIAN: Anson Fret, MD CLINICAL HISTORY: 72 year old male with low back pain and numbness. EXAM: MR LUMBAR SPINE WO CONTRAST TECHNIQUE: MRI of the lumbar spine was obtained utilizing multiplanar, multiecho pulse sequences. CONTRAST: none COMPARISON: none IMAGING SITE: [facility] [address] [place] FINDINGS: On sagittal views the vertebral bodies have normal height and alignment.  The conus medullaris terminates at the level of L1.  On axial views: L1-2: no spinal stenosis or foraminal narrowing L2-3: no spinal stenosis or foraminal narrowing L3-4: disc bulging with mild biforaminal  stenosis L4-5: disc bulging with mild biforaminal stenosis L5-S1: disc bulging and facet hypertrophy with mild-moderate right and mild left foraminal stenosis Limited views of the aorta, kidneys, iliopsoas muscles and sacroiliac joints are unremarkable.   MRI lumbar spine without contrast demonstrating: -Mild degenerative changes with mild foraminal stenosis from L3-4 down to L5-S1 as above. INTERPRETING PHYSICIAN: Suanne Marker, MD Certified in Neurology, Neurophysiology and Neuroimaging [facility] [address] [place], [place] [address] [phone]   Assessment & Plan:   Restless leg syndrome, familial -     B Complex Vitamins; Take 1 capsule by mouth daily.  Dispense: 90 capsule; Refill: 1 -     IBC + Ferritin; Future  Type II diabetes mellitus with manifestations (HCC)- His blood sugar is very well-controlled. -     Tirzepatide; Inject 7.5 mg into the skin once a week.  Dispense: 6 mL; Refill: 0 -     Hemoglobin A1c; Future -     Basic metabolic panel; Future -     HM Diabetes Foot Exam  Benign prostatic hyperplasia without lower urinary tract symptoms -     PSA; Future  Essential hypertension- His blood pressure is adequately well-controlled. -     Basic metabolic panel; Future  Thrombocytopenia (HCC)- His platelets are normal now. -     Vitamin B12; Future -     CBC with Differential/Platelet; Future -     Folate; Future  Vitamin B12 deficiency neuropathy (HCC) -     Vitamin B12; Future -     CBC with Differential/Platelet; Future -     Folate; Future  Steatohepatitis -     Hepatic function panel; Future -     Protime-INR; Future  DOE (dyspnea on exertion)- Labs and EKG are reassuring. -     Troponin I (High Sensitivity); Future -     Brain natriuretic peptide; Future -     EKG 12-Lead  Other iron deficiency anemia -     IBC + Ferritin; Future  PSA elevation -     Ambulatory referral to Urology     Follow-up: Return in about  6 months (around 11/09/2023).  Sanda Linger, MD

## 2023-05-10 NOTE — Patient Instructions (Signed)

## 2023-05-12 DIAGNOSIS — M545 Low back pain, unspecified: Secondary | ICD-10-CM | POA: Diagnosis not present

## 2023-05-15 ENCOUNTER — Encounter: Payer: Self-pay | Admitting: Internal Medicine

## 2023-05-16 DIAGNOSIS — M545 Low back pain, unspecified: Secondary | ICD-10-CM | POA: Diagnosis not present

## 2023-05-19 DIAGNOSIS — M545 Low back pain, unspecified: Secondary | ICD-10-CM | POA: Diagnosis not present

## 2023-05-27 DIAGNOSIS — M545 Low back pain, unspecified: Secondary | ICD-10-CM | POA: Diagnosis not present

## 2023-06-04 ENCOUNTER — Other Ambulatory Visit: Payer: Self-pay | Admitting: Internal Medicine

## 2023-06-04 DIAGNOSIS — F332 Major depressive disorder, recurrent severe without psychotic features: Secondary | ICD-10-CM

## 2023-06-04 DIAGNOSIS — F411 Generalized anxiety disorder: Secondary | ICD-10-CM

## 2023-06-06 MED ORDER — DESVENLAFAXINE SUCCINATE ER 50 MG PO TB24
50.00 mg | ORAL_TABLET | Freq: Every day | ORAL | 0 refills | Status: AC
Start: 2023-06-06 — End: ?
  Filled 2023-06-06: qty 90, 90d supply, fill #0

## 2023-06-06 MED ORDER — TRAZODONE HCL 150 MG PO TABS
150.00 mg | ORAL_TABLET | Freq: Every evening | ORAL | 0 refills | Status: AC | PRN
Start: 2023-06-06 — End: ?
  Filled 2023-06-06: qty 90, 90d supply, fill #0

## 2023-06-07 ENCOUNTER — Encounter: Payer: Self-pay | Admitting: Internal Medicine

## 2023-06-07 ENCOUNTER — Encounter: Payer: Self-pay | Admitting: Pharmacist

## 2023-06-07 ENCOUNTER — Other Ambulatory Visit: Payer: Self-pay

## 2023-06-07 ENCOUNTER — Other Ambulatory Visit (HOSPITAL_COMMUNITY): Payer: Self-pay

## 2023-06-07 DIAGNOSIS — M545 Low back pain, unspecified: Secondary | ICD-10-CM | POA: Diagnosis not present

## 2023-06-08 ENCOUNTER — Other Ambulatory Visit: Payer: Self-pay | Admitting: Internal Medicine

## 2023-06-08 ENCOUNTER — Other Ambulatory Visit: Payer: Self-pay

## 2023-06-08 DIAGNOSIS — F332 Major depressive disorder, recurrent severe without psychotic features: Secondary | ICD-10-CM

## 2023-06-08 DIAGNOSIS — F411 Generalized anxiety disorder: Secondary | ICD-10-CM

## 2023-06-08 MED ORDER — TRAZODONE HCL 150 MG PO TABS
150.0000 mg | ORAL_TABLET | Freq: Every evening | ORAL | 0 refills | Status: DC | PRN
Start: 2023-06-08 — End: 2023-07-11

## 2023-06-08 MED ORDER — DESVENLAFAXINE SUCCINATE ER 50 MG PO TB24: 50.0000 mg | ORAL_TABLET | Freq: Every day | ORAL | 0 refills | Status: DC

## 2023-06-09 ENCOUNTER — Other Ambulatory Visit (HOSPITAL_COMMUNITY): Payer: Self-pay

## 2023-06-09 DIAGNOSIS — M545 Low back pain, unspecified: Secondary | ICD-10-CM | POA: Diagnosis not present

## 2023-06-13 DIAGNOSIS — M545 Low back pain, unspecified: Secondary | ICD-10-CM | POA: Diagnosis not present

## 2023-06-22 ENCOUNTER — Ambulatory Visit: Payer: PPO | Admitting: Cardiology

## 2023-06-22 DIAGNOSIS — H401131 Primary open-angle glaucoma, bilateral, mild stage: Secondary | ICD-10-CM | POA: Diagnosis not present

## 2023-06-23 ENCOUNTER — Telehealth: Payer: Self-pay | Admitting: Internal Medicine

## 2023-06-23 NOTE — Telephone Encounter (Signed)
LVM informing pt that PCP is still working on forms and that he would receive a call once they are ready.

## 2023-06-23 NOTE — Telephone Encounter (Signed)
Pt called checking on the status of his paper work from the [facility]. Please advise.

## 2023-06-23 NOTE — Telephone Encounter (Signed)
I am working on this

## 2023-06-24 NOTE — Telephone Encounter (Signed)
Forms are ready for pick up at the front desk.   LVM informing pt.

## 2023-07-10 ENCOUNTER — Other Ambulatory Visit: Payer: Self-pay | Admitting: Internal Medicine

## 2023-07-10 DIAGNOSIS — F332 Major depressive disorder, recurrent severe without psychotic features: Secondary | ICD-10-CM

## 2023-07-11 ENCOUNTER — Other Ambulatory Visit: Payer: Self-pay | Admitting: Internal Medicine

## 2023-07-11 DIAGNOSIS — F411 Generalized anxiety disorder: Secondary | ICD-10-CM

## 2023-07-11 DIAGNOSIS — F332 Major depressive disorder, recurrent severe without psychotic features: Secondary | ICD-10-CM

## 2023-07-11 DIAGNOSIS — G2581 Restless legs syndrome: Secondary | ICD-10-CM

## 2023-07-11 MED ORDER — DESVENLAFAXINE SUCCINATE ER 50 MG PO TB24
50.0000 mg | ORAL_TABLET | Freq: Every day | ORAL | 0 refills | Status: DC
Start: 2023-07-11 — End: 2024-01-03

## 2023-07-11 MED ORDER — ROPINIROLE HCL ER 4 MG PO TB24
4.0000 mg | ORAL_TABLET | Freq: Every day | ORAL | 0 refills | Status: DC
Start: 2023-07-11 — End: 2023-09-24

## 2023-07-16 ENCOUNTER — Encounter: Payer: Self-pay | Admitting: Cardiology

## 2023-07-19 ENCOUNTER — Encounter: Payer: Self-pay | Admitting: Cardiology

## 2023-07-20 ENCOUNTER — Encounter: Payer: Self-pay | Admitting: Internal Medicine

## 2023-08-04 ENCOUNTER — Ambulatory Visit: Payer: PPO | Admitting: Cardiology

## 2023-08-10 NOTE — Telephone Encounter (Signed)
Form has been given to PCP to review and sign.

## 2023-08-10 NOTE — Telephone Encounter (Signed)
Patient dropped off document DMV Form, to be filled out by provider. Patient requested to send it back via Call Patient to pick up within 7-days. Document is located in providers tray at front office.Please advise at Mobile [phone] (mobile)

## 2023-08-12 NOTE — Telephone Encounter (Signed)
Pt's wife has picked up th forms

## 2023-08-23 DIAGNOSIS — M545 Low back pain, unspecified: Secondary | ICD-10-CM | POA: Diagnosis not present

## 2023-08-28 ENCOUNTER — Encounter: Payer: Self-pay | Admitting: Internal Medicine

## 2023-08-29 ENCOUNTER — Other Ambulatory Visit: Payer: Self-pay | Admitting: Internal Medicine

## 2023-08-29 ENCOUNTER — Ambulatory Visit: Payer: PPO | Admitting: Cardiology

## 2023-08-29 DIAGNOSIS — E118 Type 2 diabetes mellitus with unspecified complications: Secondary | ICD-10-CM

## 2023-08-29 MED ORDER — TIRZEPATIDE 7.5 MG/0.5ML ~~LOC~~ SOAJ
7.5000 mg | SUBCUTANEOUS | 0 refills | Status: DC
Start: 2023-08-29 — End: 2023-12-19

## 2023-09-01 ENCOUNTER — Ambulatory Visit: Payer: PPO | Admitting: Podiatry

## 2023-09-01 ENCOUNTER — Encounter: Payer: Self-pay | Admitting: Podiatry

## 2023-09-01 DIAGNOSIS — L600 Ingrowing nail: Secondary | ICD-10-CM

## 2023-09-02 NOTE — Progress Notes (Signed)
Subjective:   Patient ID: Lee Lee, male   DOB: 72 y.o.   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  Reappoint to recheck as needed

## 2023-09-09 ENCOUNTER — Other Ambulatory Visit: Payer: Self-pay | Admitting: Cardiology

## 2023-09-09 DIAGNOSIS — I77810 Thoracic aortic ectasia: Secondary | ICD-10-CM

## 2023-09-10 ENCOUNTER — Encounter: Payer: Self-pay | Admitting: Internal Medicine

## 2023-09-12 ENCOUNTER — Ambulatory Visit (INDEPENDENT_AMBULATORY_CARE_PROVIDER_SITE_OTHER): Payer: PPO

## 2023-09-12 ENCOUNTER — Other Ambulatory Visit: Payer: Self-pay | Admitting: Internal Medicine

## 2023-09-12 VITALS — BP 140/88 | HR 75 | Ht 70.5 in | Wt 223.0 lb

## 2023-09-12 DIAGNOSIS — Z23 Encounter for immunization: Secondary | ICD-10-CM

## 2023-09-12 DIAGNOSIS — Z0000 Encounter for general adult medical examination without abnormal findings: Secondary | ICD-10-CM

## 2023-09-12 NOTE — Patient Instructions (Signed)
Mr. Bastyr , Thank you for taking time to come for your Medicare Wellness Visit. I appreciate your ongoing commitment to your health goals. Please review the following plan we discussed and let me know if I can assist you in the future.   Referrals/Orders/Follow-Ups/Clinician Recommendations: Keep up the good work.  This is a list of the screening recommended for you and due dates:  Health Maintenance  Topic Date Due   Yearly kidney health urinalysis for diabetes  Never done   Flu Shot  06/30/2023   Hemoglobin A1C  11/09/2023   Eye exam for diabetics  04/10/2024   Yearly kidney function blood test for diabetes  05/09/2024   Complete foot exam   05/14/2024   Medicare Annual Wellness Visit  09/11/2024   Colon Cancer Screening  04/13/2026   DTaP/Tdap/Td vaccine (2 - Td or Tdap) 04/29/2027   Pneumonia Vaccine  Completed   Hepatitis C Screening  Completed   Zoster (Shingles) Vaccine  Completed   HPV Vaccine  Aged Out   COVID-19 Vaccine  Discontinued    Advanced directives: (Copy Requested) Please bring a copy of your health care power of attorney and living will to the office to be added to your chart at your convenience.  Next Medicare Annual Wellness Visit scheduled for next year: Yes

## 2023-09-12 NOTE — Progress Notes (Signed)
Subjective:   Lee Lee is a 72 y.o. male who presents for Medicare Annual/Subsequent preventive examination.  Visit Complete: In person   Cardiac Risk Factors include: advanced age (>18men, >24 women);male gender;hypertension;Other (see comment);diabetes mellitus;dyslipidemia, Risk factor comments: Atrial flutter, SVT, PAD,     Objective:    Today's Vitals   09/12/23 0818  BP: (!) 140/88  Pulse: 75  SpO2: 99%  Weight: 223 lb (101.2 kg)  Height: 5' 10.5" (1.791 m)  PainSc: 7    Body mass index is 31.54 kg/m.     09/12/2023    8:23 AM 09/20/2022   10:19 AM 12/16/2021    5:46 PM 09/28/2021    9:04 PM 09/28/2021    7:04 PM 09/27/2021    1:48 PM 09/11/2021   10:28 AM  Advanced Directives  Does Patient Have a Medical Advance Directive? Yes Yes No Yes Yes Yes Yes  Type of Special educational needs teacher of [place];Living will  Healthcare Power of [place];Living will Healthcare Power of [place];Living will Living will;Healthcare Power of Attorney Living will;Healthcare Power of Attorney  Does patient want to make changes to medical advance directive?    No - Patient declined  No - Patient declined No - Patient declined  Copy of Healthcare Power of Attorney in Chart?  No - copy requested  No - copy requested  No - copy requested No - copy requested    Current Medications (verified) Outpatient Encounter Medications as of 09/12/2023  Medication Sig   b complex vitamins capsule Take 1 capsule by mouth daily.   busPIRone (BUSPAR) 5 MG tablet TAKE ONE TABLET BY MOUTH THREE TIMES A DAY   desvenlafaxine (PRISTIQ) 50 MG 24 hr tablet Take 1 tablet (50 mg total) by mouth daily.   rOPINIRole (REQUIP XL) 4 MG 24 hr tablet Take 1 tablet (4 mg total) by mouth at bedtime. Per MD return in about 6 months (around 11/09/2023).   tirzepatide (MOUNJARO) 7.5 MG/0.5ML Pen Inject 7.5 mg into the skin once a week.   traZODone (DESYREL) 150 MG tablet TAKE 1 TABLET BY MOUTH EVERY NIGHT  AT BEDTIME AS NEEDED FOR SLEEP   losartan (COZAAR) 25 MG tablet Take 1 tablet (25 mg total) by mouth every evening.   No facility-administered encounter medications on file as of 09/12/2023.    Allergies (verified) Patient has no allergy information on record.   History: Past Medical History:  Diagnosis Date   Alcohol abuse, episodic drinking behavior    Anxiety    Arthritis    back & knees   Atrial flutter (HCC)    s/p RFCA 01/05/13   Calcium oxalate renal stones    Complication of anesthesia    makes him loopy   Depression    ED (erectile dysfunction)    Hemorrhoids    Hypertension    Past Surgical History:  Procedure Laterality Date   ABLATION OF DYSRHYTHMIC FOCUS  01/05/2013   ARTHROSCOPIC REPAIR ACL     ATRIAL FLUTTER ABLATION N/A 01/05/2013   Procedure: ATRIAL FLUTTER ABLATION;  Surgeon: Marinus Maw, MD;  Location[facility] CATH LAB;  Service: Cardiovascular;  Laterality: N/A;   COLONOSCOPY  11/2009   Dr. Elnoria Howard   ELECTROPHYSIOLOGIC STUDY N/A 07/04/2015   Procedure: A-Flutter Ablation;  Surgeon: Marinus Maw, MD;  Location: [facility] INVASIVE CV LAB;  Service: Cardiovascular;  Laterality: N/A;   ROTATOR CUFF REPAIR     TOTAL KNEE ARTHROPLASTY Left 10/30/2019   Procedure: TOTAL KNEE ARTHROPLASTY;  Surgeon: Durene Romans, MD;  Location: [facility] ORS;  Service: Orthopedics;  Laterality: Left;  70 mins   Family History  Problem Relation Age of Onset   Valvular heart disease Mother    Lymphoma Mother    Restless legs syndrome Mother    Neuropathy Neg Hx    Social History   Socioeconomic History   Marital status: Married    Spouse name: Clydie Braun   Number of children: 2   Years of education: Not on file   Highest education level: Not on file  Occupational History   Occupation: Retired  Tobacco Use   Smoking status: Former    Types: Cigars    Quit date: 2002    Years since quitting: 22.8    Passive exposure: Past   Smokeless tobacco: Current    Types: Chew   Tobacco comments:     chews tobacco when playing golf only  Vaping Use   Vaping status: Never Used  Substance and Sexual Activity   Alcohol use: Not Currently   Drug use: No   Sexual activity: Yes    Partners: Female  Other Topics Concern   Not on file  Social History Narrative   Lives with wife   Social Determinants of Health   Financial Resource Strain: Low Risk  (09/12/2023)   Overall Financial Resource Strain (CARDIA)    Difficulty of Paying Living Expenses: Not hard at all  Food Insecurity: No Food Insecurity (09/12/2023)   Hunger Vital Sign    Worried About Running Out of Food in the Last Year: Never true    Ran Out of Food in the Last Year: Never true  Transportation Needs: No Transportation Needs (09/12/2023)   PRAPARE - Administrator, Civil Service (Medical): No    Lack of Transportation (Non-Medical): No  Physical Activity: Sufficiently Active (09/12/2023)   Exercise Vital Sign    Days of Exercise per Week: 7 days    Minutes of Exercise per Session: 30 min  Stress: No Stress Concern Present (09/12/2023)   [employer] of Occupational Health - Occupational Stress Questionnaire    Feeling of Stress : Not at all  Social Connections: Moderately Integrated (09/12/2023)   Social Connection and Isolation Panel [NHANES]    Frequency of Communication with Friends and Family: More than three times a week    Frequency of Social Gatherings with Friends and Family: More than three times a week    Attends Religious Services: More than 4 times per year    Active Member of [employer] or Organizations: No    Attends Engineer, structural: Never    Marital Status: Married    Tobacco Counseling Ready to quit: Not Answered Counseling given: Not Answered Tobacco comments: chews tobacco when playing golf only   Clinical Intake:  Pre-visit preparation completed: Yes  Pain : 0-10 Pain Score: 7  Pain Location: Foot (Neuropathy in Both feet) Pain Descriptors / Indicators:  Numbness, Discomfort Pain Onset: More than a month ago Pain Frequency: Constant Pain Relieving Factors: None  Pain Relieving Factors: None  BMI - recorded: 31.54 Nutritional Status: BMI > 30  Obese Nutritional Risks: None Diabetes: Yes CBG done?: No Did pt. bring in CBG monitor from home?: No  How often do you need to have someone help you when you read instructions, pamphlets, or other written materials from your doctor or pharmacy?: 1 - Never  Interpreter Needed?: No  Information entered by :: Aliz Meritt, RMA   Activities of Daily  Living    09/12/2023    8:20 AM 09/20/2022   10:30 AM  In your present state of health, do you have any difficulty performing the following activities:  Hearing? 0 0  Vision? 0 0  Difficulty concentrating or making decisions? 0 0  Walking or climbing stairs? 0 0  Dressing or bathing? 0 0  Doing errands, shopping? 0 0  Preparing Food and eating ? N N  Using the Toilet? N N  In the past six months, have you accidently leaked urine? Y N  Do you have problems with loss of bowel control? N N  Managing your Medications? N N  Managing your Finances? N N  Housekeeping or managing your Housekeeping? N N    Patient Care Team: Etta Grandchild, MD as PCP - General (Internal Medicine) Eldred Manges, MD as Consulting Physician (Orthopedic Surgery) Kathyrn Sheriff, [facility] (Inactive) as Pharmacist (Pharmacist) Yates Decamp, MD as Consulting Physician (Cardiology) Janet Berlin, MD as Consulting Physician (Ophthalmology) [place], [facility]  Indicate any recent Medical Services you may have received from other than [facility] providers in the past year (date may be approximate).     Assessment:   This is a routine wellness examination for Jose.  Hearing/Vision screen Hearing Screening - Comments:: Denies hearing difficulties     Goals Addressed             This Visit's Progress    My goal is to continue to lose weight.   My weight goal is 210 pounds.   On track     Depression Screen    09/12/2023    8:28 AM 05/10/2023    9:30 AM 12/08/2022    9:31 AM 09/20/2022   10:22 AM 06/10/2022    8:03 AM 02/01/2022    9:19 AM 01/29/2022   11:01 AM  PHQ 2/9 Scores  PHQ - 2 Score 2 0 1 0 0 0 0  PHQ- 9 Score 2 0 3  3 0     Fall Risk    09/12/2023    8:24 AM 12/08/2022    9:32 AM 09/20/2022   10:20 AM 01/29/2022   11:00 AM 09/11/2021   10:29 AM  Fall Risk   Falls in the past year? 0 0 0 1 0  Number falls in past yr: 0 0 0 0 0  Injury with Fall? 0 0 0 0 0  Risk for fall due to : No Fall Risks No Fall Risks No Fall Risks  No Fall Risks  Follow up Falls prevention discussed;Falls evaluation completed Falls evaluation completed Falls prevention discussed  Falls evaluation completed    MEDICARE RISK AT HOME: Medicare Risk at Home Any stairs in or around the home?: Yes If so, are there any without handrails?: Yes Home free of loose throw rugs in walkways, pet beds, electrical cords, etc?: Yes Adequate lighting in your home to reduce risk of falls?: Yes Life alert?: No Use of a cane, walker or w/c?: No Grab bars in the bathroom?: Yes Shower chair or bench in shower?: Yes Elevated toilet seat or a handicapped toilet?: Yes  TIMED UP AND GO:  Was the test performed?  Yes  Length of time to ambulate 10 feet: 10 sec Gait slow and steady without use of assistive device    Cognitive Function:    12/26/2018   12:31 PM  MMSE - Mini Mental State Exam  Orientation to time 5  Orientation to Place 5  Registration 3  Attention/ Calculation 5  Recall 2  Language- name 2 objects 2  Language- repeat 1  Language- follow 3 step command 3  Language- read & follow direction 1  Write a sentence 1  Copy design 1  Total score 29        09/12/2023    8:24 AM 09/20/2022   10:31 AM  6CIT Screen  What Year? 0 points 0 points  What month? 0 points 0 points  What time? 0 points 0 points  Count back from 20 0 points 0  points  Months in reverse 0 points 0 points  Repeat phrase 0 points 0 points  Total Score 0 points 0 points    Immunizations Immunization History  Administered Date(s) Administered   Fluad Quad(high Dose 65+) 08/07/2021, 08/31/2022   Hepatitis A, Adult 07/17/2019   Influenza, High Dose Seasonal PF 07/27/2018, 07/17/2019   Influenza,inj,Quad PF,6+ Mos 10/31/2017   Influenza-Unspecified 07/19/2019, 09/29/2020, 08/13/2021   Moderna Sars-Covid-2 Vaccination 01/15/2020, 02/14/2020, 08/07/2021   Pneumococcal Conjugate-13 04/28/2017   Pneumococcal Polysaccharide-23 12/26/2018   Tdap 04/28/2017   Zoster Recombinant(Shingrix) 08/23/2018, 10/10/2018    TDAP status: Up to date  Flu Vaccine status: Completed at today's visit  Pneumococcal vaccine status: Up to date  Covid-19 vaccine status: Information provided on how to obtain vaccines.   Qualifies for Shingles Vaccine? Yes   Zostavax completed Yes   Shingrix Completed?: Yes  Screening Tests Health Maintenance  Topic Date Due   Diabetic kidney evaluation - Urine ACR  Never done   INFLUENZA VACCINE  06/30/2023   HEMOGLOBIN A1C  11/09/2023   OPHTHALMOLOGY EXAM  04/10/2024   Diabetic kidney evaluation - eGFR measurement  05/09/2024   FOOT EXAM  05/14/2024   Medicare Annual Wellness (AWV)  09/11/2024   Colonoscopy  04/13/2026   DTaP/Tdap/Td (2 - Td or Tdap) 04/29/2027   Pneumonia Vaccine 18+ Years old  Completed   Hepatitis C Screening  Completed   Zoster Vaccines- Shingrix  Completed   HPV VACCINES  Aged Out   COVID-19 Vaccine  Discontinued    Health Maintenance  Health Maintenance Due  Topic Date Due   Diabetic kidney evaluation - Urine ACR  Never done   INFLUENZA VACCINE  06/30/2023    Colorectal cancer screening: Type of screening: Colonoscopy. Completed 04/13/2016. Repeat every 10 years  Lung Cancer Screening: (Low Dose CT Chest recommended if Age 56-80 years, 20 pack-year currently smoking OR have quit w/in  15years.) does not qualify.   Lung Cancer Screening Referral: n/a  Additional Screening:  Hepatitis C Screening: does qualify; Completed 07/12/2022  Vision Screening: Recommended annual ophthalmology exams for early detection of glaucoma and other disorders of the eye. Is the patient up to date with their annual eye exam?  Yes  Who is the provider or what is the name of the office in which the patient attends annual eye exams? Dr. Burgess Estelle If pt is not established with a provider, would they like to be referred to a provider to establish care? No .   Dental Screening: Recommended annual dental exams for proper oral hygiene  Diabetic Foot Exam: Diabetic Foot Exam: Completed 05/15/2023  Community Resource Referral / Chronic Care Management: CRR required this visit?  No   CCM required this visit?  No     Plan:     I have personally reviewed and noted the following in the patient's chart:   Medical and social history Use of alcohol, tobacco or illicit drugs  Current medications and  supplements including opioid prescriptions. Patient is not currently taking opioid prescriptions. Functional ability and status Nutritional status Physical activity Advanced directives List of other physicians Hospitalizations, surgeries, and ER visits in previous 12 months Vitals Screenings to include cognitive, depression, and falls Referrals and appointments  In addition, I have reviewed and discussed with patient certain preventive protocols, quality metrics, and best practice recommendations. A written personalized care plan for preventive services as well as general preventive health recommendations were provided to patient.     Zyeir Dymek L Remington Skalsky, CMA   09/12/2023   After Visit Summary: (MyChart) Due to this being a telephonic visit, the after visit summary with patients personalized plan was offered to patient via MyChart   Nurse Notes: Patient is up to date on all Health maintenance.  He  does present issues with the Neuropathy in his feet.  Patient complains of pain and being off balance because of it and also not being able to walk with out any shoes on.  Patient is wanting to know if PCP can  recommend something else for the pain.  He is also asking for a refill on his Losartan 25 mg.  He has no other concerns to address today.

## 2023-09-15 ENCOUNTER — Other Ambulatory Visit: Payer: Self-pay | Admitting: Internal Medicine

## 2023-09-15 DIAGNOSIS — G63 Polyneuropathy in diseases classified elsewhere: Secondary | ICD-10-CM

## 2023-09-15 DIAGNOSIS — G621 Alcoholic polyneuropathy: Secondary | ICD-10-CM

## 2023-09-15 DIAGNOSIS — E538 Deficiency of other specified B group vitamins: Secondary | ICD-10-CM

## 2023-09-19 DIAGNOSIS — M545 Low back pain, unspecified: Secondary | ICD-10-CM | POA: Diagnosis not present

## 2023-09-24 ENCOUNTER — Other Ambulatory Visit: Payer: Self-pay | Admitting: Internal Medicine

## 2023-09-24 DIAGNOSIS — G2581 Restless legs syndrome: Secondary | ICD-10-CM

## 2023-09-24 DIAGNOSIS — F332 Major depressive disorder, recurrent severe without psychotic features: Secondary | ICD-10-CM

## 2023-09-24 DIAGNOSIS — F411 Generalized anxiety disorder: Secondary | ICD-10-CM

## 2023-09-26 MED ORDER — TRAZODONE HCL 150 MG PO TABS
150.0000 mg | ORAL_TABLET | Freq: Every evening | ORAL | 0 refills | Status: DC | PRN
Start: 2023-09-26 — End: 2024-03-22

## 2023-09-26 MED ORDER — ROPINIROLE HCL ER 4 MG PO TB24
4.0000 mg | ORAL_TABLET | Freq: Every day | ORAL | 0 refills | Status: DC
Start: 2023-09-26 — End: 2023-10-03

## 2023-09-28 DIAGNOSIS — M545 Low back pain, unspecified: Secondary | ICD-10-CM | POA: Diagnosis not present

## 2023-10-03 ENCOUNTER — Other Ambulatory Visit: Payer: Self-pay | Admitting: Internal Medicine

## 2023-10-03 DIAGNOSIS — G2581 Restless legs syndrome: Secondary | ICD-10-CM

## 2023-10-04 DIAGNOSIS — M545 Low back pain, unspecified: Secondary | ICD-10-CM | POA: Diagnosis not present

## 2023-10-11 DIAGNOSIS — M545 Low back pain, unspecified: Secondary | ICD-10-CM | POA: Diagnosis not present

## 2023-10-14 ENCOUNTER — Other Ambulatory Visit: Payer: Self-pay | Admitting: Cardiology

## 2023-10-14 ENCOUNTER — Encounter: Payer: Self-pay | Admitting: Pharmacist

## 2023-10-14 DIAGNOSIS — I77810 Thoracic aortic ectasia: Secondary | ICD-10-CM

## 2023-10-14 MED ORDER — LOSARTAN POTASSIUM 25 MG PO TABS: 25.0000 mg | ORAL_TABLET | Freq: Every evening | ORAL | 3 refills | Status: DC

## 2023-10-14 NOTE — Progress Notes (Signed)
Pharmacy Quality Measure Review  This patient is appearing on a report for being at risk of failing the adherence measure for diabetes medications this calendar year.   Medication: Mounjaro 7.5 mg Last fill date: 10/03/23 for 28 day supply  Insurance report was not up to date. No action needed at this time.   Arbutus Leas, PharmD, BCPS Clinical Pharmacist [facility] [phone]

## 2023-11-10 DIAGNOSIS — N401 Enlarged prostate with lower urinary tract symptoms: Secondary | ICD-10-CM | POA: Diagnosis not present

## 2023-11-10 DIAGNOSIS — R972 Elevated prostate specific antigen [PSA]: Secondary | ICD-10-CM | POA: Diagnosis not present

## 2023-11-10 DIAGNOSIS — R3915 Urgency of urination: Secondary | ICD-10-CM | POA: Diagnosis not present

## 2023-11-10 DIAGNOSIS — R3121 Asymptomatic microscopic hematuria: Secondary | ICD-10-CM | POA: Diagnosis not present

## 2023-11-17 ENCOUNTER — Other Ambulatory Visit: Payer: Self-pay | Admitting: Urology

## 2023-11-17 DIAGNOSIS — R972 Elevated prostate specific antigen [PSA]: Secondary | ICD-10-CM

## 2023-11-18 DIAGNOSIS — M545 Low back pain, unspecified: Secondary | ICD-10-CM | POA: Diagnosis not present

## 2023-11-20 ENCOUNTER — Other Ambulatory Visit: Payer: Self-pay | Admitting: Pharmacist

## 2023-11-20 NOTE — Progress Notes (Signed)
Pharmacy Quality Measure Review  This patient is appearing on a report for being at risk of failing the adherence measure for diabetes medications this calendar year.   Medication: Mounjaro 7.5 mg  Last fill date: 10/03/23 for 28 day supply  Contacted patient to discuss adherence. He reports filling Mounjaro last week and confirmed he has medication on hand at home. Report was not up to date. No further action needed at this time.  Jarrett Ables, PharmD PGY-1 Pharmacy Resident

## 2023-12-02 ENCOUNTER — Other Ambulatory Visit: Payer: Self-pay | Admitting: Internal Medicine

## 2023-12-02 ENCOUNTER — Encounter: Payer: Self-pay | Admitting: Internal Medicine

## 2023-12-02 DIAGNOSIS — E538 Deficiency of other specified B group vitamins: Secondary | ICD-10-CM

## 2023-12-02 DIAGNOSIS — G621 Alcoholic polyneuropathy: Secondary | ICD-10-CM

## 2023-12-05 ENCOUNTER — Telehealth: Payer: Self-pay | Admitting: Internal Medicine

## 2023-12-05 NOTE — Telephone Encounter (Signed)
 Unable to reach patient, [facility]. This is being handled via mychart. He needs an appointment with Dr. Yetta Barre so that we can place his referral.

## 2023-12-05 NOTE — Telephone Encounter (Signed)
 Copied from CRM [id number]. Topic: Referral - Question >> Dec 02, 2023  3:57 PM Adaysia C wrote: Reason for CRM: Patient received a message from the nurse about scheduling a referral appointment to see a Neuropathy specialist. Please follow up with patient [phone]

## 2023-12-06 DIAGNOSIS — M545 Low back pain, unspecified: Secondary | ICD-10-CM | POA: Diagnosis not present

## 2023-12-13 DIAGNOSIS — M542 Cervicalgia: Secondary | ICD-10-CM | POA: Diagnosis not present

## 2023-12-13 DIAGNOSIS — M545 Low back pain, unspecified: Secondary | ICD-10-CM | POA: Diagnosis not present

## 2023-12-15 ENCOUNTER — Ambulatory Visit: Payer: PPO | Admitting: Internal Medicine

## 2023-12-15 DIAGNOSIS — R3121 Asymptomatic microscopic hematuria: Secondary | ICD-10-CM | POA: Diagnosis not present

## 2023-12-15 DIAGNOSIS — R972 Elevated prostate specific antigen [PSA]: Secondary | ICD-10-CM | POA: Diagnosis not present

## 2023-12-15 LAB — PSA: PSA: 4.5

## 2023-12-16 DIAGNOSIS — R3129 Other microscopic hematuria: Secondary | ICD-10-CM | POA: Diagnosis not present

## 2023-12-16 DIAGNOSIS — R932 Abnormal findings on diagnostic imaging of liver and biliary tract: Secondary | ICD-10-CM | POA: Diagnosis not present

## 2023-12-16 DIAGNOSIS — N400 Enlarged prostate without lower urinary tract symptoms: Secondary | ICD-10-CM | POA: Diagnosis not present

## 2023-12-16 DIAGNOSIS — R3121 Asymptomatic microscopic hematuria: Secondary | ICD-10-CM | POA: Diagnosis not present

## 2023-12-19 ENCOUNTER — Other Ambulatory Visit: Payer: Self-pay | Admitting: Internal Medicine

## 2023-12-19 ENCOUNTER — Other Ambulatory Visit: Payer: Self-pay

## 2023-12-19 ENCOUNTER — Encounter: Payer: Self-pay | Admitting: Internal Medicine

## 2023-12-19 DIAGNOSIS — F332 Major depressive disorder, recurrent severe without psychotic features: Secondary | ICD-10-CM

## 2023-12-19 DIAGNOSIS — E118 Type 2 diabetes mellitus with unspecified complications: Secondary | ICD-10-CM

## 2023-12-19 DIAGNOSIS — F411 Generalized anxiety disorder: Secondary | ICD-10-CM

## 2023-12-19 MED ORDER — TIRZEPATIDE 7.5 MG/0.5ML ~~LOC~~ SOAJ: 7.5000 mg | SUBCUTANEOUS | 1 refills | Status: DC

## 2023-12-24 ENCOUNTER — Encounter: Payer: Self-pay | Admitting: Internal Medicine

## 2023-12-27 ENCOUNTER — Other Ambulatory Visit: Payer: Self-pay | Admitting: Family

## 2023-12-27 DIAGNOSIS — G5600 Carpal tunnel syndrome, unspecified upper limb: Secondary | ICD-10-CM | POA: Diagnosis not present

## 2023-12-27 DIAGNOSIS — M545 Low back pain, unspecified: Secondary | ICD-10-CM | POA: Diagnosis not present

## 2023-12-27 DIAGNOSIS — Z6831 Body mass index (BMI) 31.0-31.9, adult: Secondary | ICD-10-CM | POA: Diagnosis not present

## 2023-12-27 DIAGNOSIS — G609 Hereditary and idiopathic neuropathy, unspecified: Secondary | ICD-10-CM | POA: Diagnosis not present

## 2023-12-27 DIAGNOSIS — M542 Cervicalgia: Secondary | ICD-10-CM | POA: Diagnosis not present

## 2023-12-27 DIAGNOSIS — G629 Polyneuropathy, unspecified: Secondary | ICD-10-CM | POA: Diagnosis not present

## 2023-12-27 MED ORDER — GABAPENTIN 100 MG PO CAPS: 100.0000 mg | ORAL_CAPSULE | Freq: Two times a day (BID) | ORAL | 0 refills | Status: DC

## 2023-12-29 ENCOUNTER — Telehealth: Payer: Self-pay

## 2023-12-29 NOTE — Telephone Encounter (Signed)
Pharmacy Patient Advocate Encounter  Received notification from [facility] ADVANTAGE/RX ADVANCE that Prior Authorization for [facility] 7.5MG /0.5ML auto-injectors has been APPROVED from 12/29/2023 to 12/28/2024   PA #/Case ID/Reference #: [id number] KEY: [id number]

## 2024-01-02 ENCOUNTER — Other Ambulatory Visit: Payer: PPO

## 2024-01-03 ENCOUNTER — Other Ambulatory Visit: Payer: Self-pay | Admitting: Internal Medicine

## 2024-01-03 DIAGNOSIS — G2581 Restless legs syndrome: Secondary | ICD-10-CM

## 2024-01-03 DIAGNOSIS — F332 Major depressive disorder, recurrent severe without psychotic features: Secondary | ICD-10-CM

## 2024-01-06 ENCOUNTER — Encounter: Payer: Self-pay | Admitting: Cardiology

## 2024-01-06 ENCOUNTER — Ambulatory Visit: Payer: Self-pay | Admitting: Surgery

## 2024-01-06 ENCOUNTER — Ambulatory Visit: Payer: PPO | Attending: Cardiology | Admitting: Cardiology

## 2024-01-06 ENCOUNTER — Telehealth: Payer: Self-pay | Admitting: *Deleted

## 2024-01-06 VITALS — BP 132/78 | HR 76 | Ht 71.0 in | Wt 234.4 lb

## 2024-01-06 DIAGNOSIS — R002 Palpitations: Secondary | ICD-10-CM | POA: Diagnosis not present

## 2024-01-06 DIAGNOSIS — K6389 Other specified diseases of intestine: Secondary | ICD-10-CM | POA: Diagnosis not present

## 2024-01-06 DIAGNOSIS — I10 Essential (primary) hypertension: Secondary | ICD-10-CM | POA: Diagnosis not present

## 2024-01-06 DIAGNOSIS — I4892 Unspecified atrial flutter: Secondary | ICD-10-CM | POA: Diagnosis not present

## 2024-01-06 DIAGNOSIS — Z01818 Encounter for other preprocedural examination: Secondary | ICD-10-CM

## 2024-01-06 NOTE — H&P (Signed)
 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  He is on Mounjaro. As part of a workup for urinary urgency with [facility], he had a CT scan done on January 17 which demonstrates "enhancing nodules in the right hemiabdominal mesentery associated with rounding enhancing foci within adjacent loops of right lower quadrant small bowel with a differential including carcinoid tumor or just ".  Additional findings included hepatic steatosis and a small hepatic cyst, mildly enlarged prostate, aortic atherosclerosis, coronary artery calcifications, and a small nonobstructing left kidney stone.  1 enhancing nodule in the right hemiabdominal central mesentery measures 3.8 x 2.8 cm and was noted to be present on a CT scan in August 2016 at that time measuring 3.0 x 1.9 cm  He denies any specific GI complaints.  No nausea, vomiting, bloating, abdominal pain, or change in bowel movements.  1 episode last week where he felt flushed, but this was limited and has not recurred.  He and his wife are planning to go down to [place] for the month of March.    Review of Systems: A complete review of systems was obtained from the patient.  I have reviewed this information and discussed as appropriate with the patient.  See HPI as well for other ROS.   Medical History: Past Medical History:  Diagnosis Date   Anxiety    Diabetes mellitus without complication (CMS/HHS-HCC)    Hypertension    Substance abuse (CMS/HHS-HCC)     There is no problem list  on file for this patient.   Past Surgical History:  Procedure Laterality Date   Ablation of dysrhythmic focus     ARTHROSCOPIC REPAIR ACL     Atrial flutter ablation (N/A)     Rotator cuff repair surgery     Total knee arthroplasty (Left)       No Known Allergies  Current Outpatient Medications on File Prior to Visit  Medication Sig Dispense Refill   busPIRone (BUSPAR) 5 MG tablet Take 5 mg by mouth 3 (three) times daily     desvenlafaxine succinate (PRISTIQ) 50 MG ER tablet Take 50 mg by mouth once daily     gabapentin (NEURONTIN) 300 MG capsule Take 300 mg by mouth at bedtime     losartan (COZAAR) 25 MG tablet Take 25 mg by mouth once daily     MOUNJARO 7.5 mg/0.5 mL pen injector Inject 7.5 mg subcutaneously     rOPINIRole (REQUIP XL) 4 MG XL tablet Take 4 mg by mouth once daily     No current facility-administered medications on file prior to visit.    Family History  Problem Relation Age of Onset   Coronary Artery Disease (Blocked arteries around heart) Mother    Breast cancer Mother    High blood pressure (Hypertension) Father    Hyperlipidemia (Elevated cholesterol) Father    Colon cancer Father      Social History   Tobacco Use  Smoking Status Former   Types: Cigarettes  Smokeless Tobacco Never     Social History   Socioeconomic History  Marital status: Married  Tobacco Use   Smoking status: Former    Types: Cigarettes   Smokeless tobacco: Never  Vaping Use   Vaping status: Never Used  Substance and Sexual Activity   Alcohol use: Not Currently   Drug use: Never   Social Drivers of Corporate Investment Banker Strain: Low Risk  (09/12/2023)   Received from [facility]   Overall Financial Resource Strain (CARDIA)    Difficulty of Paying Living Expenses: Not hard at all  Food Insecurity: No Food Insecurity (09/12/2023)   Received from [facility]   Hunger Vital Sign    Worried About Running Out of Food in the Last Year: Never true    Ran Out of  Food in the Last Year: Never true  Transportation Needs: No Transportation Needs (09/12/2023)   Received from [facility] - Transportation    Lack of Transportation (Medical): No    Lack of Transportation (Non-Medical): No  Physical Activity: Sufficiently Active (09/12/2023)   Received from [facility]   Exercise Vital Sign    Days of Exercise per Week: 7 days    Minutes of Exercise per Session: 30 min  Stress: No Stress Concern Present (09/12/2023)   Received from [facility] of Occupational Health - Occupational Stress Questionnaire    Feeling of Stress : Not at all  Social Connections: Moderately Integrated (09/12/2023)   Received from [facility]   Social Connection and Isolation Panel [NHANES]    Frequency of Communication with Friends and Family: More than three times a week    Frequency of Social Gatherings with Friends and Family: More than three times a week    Attends Religious Services: More than 4 times per year    Active Member of Clubs or Organizations: No    Attends Banker Meetings: Never    Marital Status: Married  Housing Stability: Unknown (01/06/2024)   Housing Stability Vital Sign    Homeless in the Last Year: No    Objective:    Vitals:   01/06/24 1012  BP: (!) 148/88  Pulse: 78  Temp: 36.7 C (98.1 F)  SpO2: 99%  Weight: (!) 105.9 kg (233 lb 6.4 oz)  Height: 180.3 cm (5' 11)  PainSc: 0-No pain    Body mass index is 32.55 kg/m.  Gen: A&Ox3, no distress  Unlabored respirations  Assessment and Plan:  Diagnoses and all orders for this visit:  Small bowel mass -     Cancer Antigen 19-9 (CA 19-9); Future -     Carcinoembryonic Antigen (CEA); Future -     Complete Blood Count (CBC) -     Comprehensive Metabolic Panel (CMP)    We discussed unclear what this is, but likely slow-growing given that the largest lesion was present and is very slightly increased in size over the last 9 years.  I recommend  proceeding with a laparoscopic assisted small bowel resection and resection of the mesenteric mass for pathological examination.  We discussed the procedure and risks including bleeding, infection, pain, scarring, injury to other intra-abdominal structures, anastomotic leak/bleed/stricture, obstruction, incomplete resection and need for additional procedures.  Pending pathology results further plans can be made.  He would like to proceed to soon as possible.  We will check tumor markers and plan to proceed in the next few weeks.  Lee Lee ALAN FREUND, MD

## 2024-01-06 NOTE — Patient Instructions (Signed)
 Medication Instructions:  Continue current medications *If you need a refill on your cardiac medications before your next appointment, please call your pharmacy*   Lab Work: none If you have labs (blood work) drawn today and your tests are completely normal, you will receive your results only by: MyChart Message (if you have MyChart) OR A paper copy in the mail If you have any lab test that is abnormal or we need to change your treatment, we will call you to review the results.   Testing/Procedures: none   Follow-Up: At [facility] [facility], you and your health needs are our priority.  As part of our continuing mission to provide you with exceptional heart care, we have created designated Provider Care Teams.  These Care Teams include your primary Cardiologist (physician) and Advanced Practice Providers (APPs -  Physician Assistants and Nurse Practitioners) who all work together to provide you with the care you need, when you need it.  We recommend signing up for the patient portal called MyChart.  Sign up information is provided on this After Visit Summary.  MyChart is used to connect with patients for Virtual Visits (Telemedicine).  Patients are able to view lab/test results, encounter notes, upcoming appointments, etc.  Non-urgent messages can be sent to your provider as well.   To learn more about what you can do with MyChart, go to [email or web address].    Your next appointment:   6 month(s)  Provider:   Dr. Ladona  Other Instructions none

## 2024-01-06 NOTE — Telephone Encounter (Signed)
   Pre-operative Risk Assessment    Patient Name: Lee Lee  DOB: Nov 13, 1951 MRN: [id number]   Date of last office visit: 08/23/2022 Date of next office visit: I added pt to DOD Dr. Kate today, as surgeon's marked URGENT   Request for Surgical Clearance    Procedure:   URGENT SMALL BOWEL RESECTION  Date of Surgery:  Clearance TBD                                Surgeon:  MITZIE PEERS, MD Surgeon's Group or Practice Name:  [facility] Phone number:  [phone] Fax number:  [phone]   Type of Clearance Requested:   - Medical    Type of Anesthesia:  General    Additional requests/questions:    [facility]   01/06/2024, 1:47 PM

## 2024-01-06 NOTE — H&P (View-Only) (Signed)
 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  He is on Mounjaro . As part of a workup for urinary urgency with [facility], he had a CT scan done on January 17 which demonstrates enhancing nodules in the right hemiabdominal mesentery associated with rounding enhancing foci within adjacent loops of right lower quadrant small bowel with a differential including carcinoid tumor or just .  Additional findings included hepatic steatosis and a small hepatic cyst, mildly enlarged prostate, aortic atherosclerosis, coronary artery calcifications, and a small nonobstructing left kidney stone.  1 enhancing nodule in the right hemiabdominal central mesentery measures 3.8 x 2.8 cm and was noted to be present on a CT scan in August 2016 at that time measuring 3.0 x 1.9 cm  He denies any specific GI complaints.  No nausea, vomiting, bloating, abdominal pain, or change in bowel movements.  1 episode last week where he felt flushed, but this was limited and has not recurred.  He and his wife are planning to go down to [place]  for the month of March.    Review of Systems: A complete review of systems was obtained from the patient.  I have reviewed this information and discussed as appropriate with the patient.  See HPI as well for other ROS.   Medical History: Past Medical History:  Diagnosis Date   Anxiety    Diabetes mellitus without complication (CMS/HHS-HCC)    Hypertension    Substance abuse (CMS/HHS-HCC)     There is no problem list  on file for this patient.   Past Surgical History:  Procedure Laterality Date   Ablation of dysrhythmic focus     ARTHROSCOPIC REPAIR ACL     Atrial flutter ablation (N/A)     Rotator cuff repair surgery     Total knee arthroplasty (Left)       No Known Allergies  Current Outpatient Medications on File Prior to Visit  Medication Sig Dispense Refill   busPIRone  (BUSPAR ) 5 MG tablet Take 5 mg by mouth 3 (three) times daily     desvenlafaxine  succinate (PRISTIQ ) 50 MG ER tablet Take 50 mg by mouth once daily     gabapentin  (NEURONTIN ) 300 MG capsule Take 300 mg by mouth at bedtime     losartan  (COZAAR ) 25 MG tablet Take 25 mg by mouth once daily     MOUNJARO  7.5 mg/0.5 mL pen injector Inject 7.5 mg subcutaneously     rOPINIRole  (REQUIP  XL) 4 MG XL tablet Take 4 mg by mouth once daily     No current facility-administered medications on file prior to visit.    Family History  Problem Relation Age of Onset   Coronary Artery Disease (Blocked arteries around heart) Mother    Breast cancer Mother    High blood pressure (Hypertension) Father    Hyperlipidemia (Elevated cholesterol) Father    Colon cancer Father      Social History   Tobacco Use  Smoking Status Former   Types: Cigarettes  Smokeless Tobacco Never     Social History   Socioeconomic History  Marital status: Married  Tobacco Use   Smoking status: Former    Types: Cigarettes   Smokeless tobacco: Never  Vaping Use   Vaping status: Never Used  Substance and Sexual Activity   Alcohol use: Not Currently   Drug use: Never   Social Drivers of Corporate Investment Banker Strain: Low Risk  (09/12/2023)   Received from [facility]   Overall Financial Resource Strain (CARDIA)    Difficulty of Paying Living Expenses: Not hard at all  Food Insecurity: No Food Insecurity (09/12/2023)   [facility] from [facility]   Hunger Vital Sign    Worried About Running Out of Food in the Last Year: Never true    Ran Out of  Food in the Last Year: Never true  Transportation Needs: No Transportation Needs (09/12/2023)   Received from [facility] - Transportation    Lack of Transportation (Medical): No    Lack of Transportation (Non-Medical): No  Physical Activity: Sufficiently Active (09/12/2023)   Received from [facility]   Exercise Vital Sign    Days of Exercise per Week: 7 days    Minutes of Exercise per Session: 30 min  Stress: No Stress Concern Present (09/12/2023)   Received from [facility] - Occupational Stress Questionnaire    Feeling of Stress : Not at all  Social Connections: Moderately Integrated (09/12/2023)   Received from [facility]   Social Connection and Isolation Panel [NHANES]    Frequency of Communication with Friends and Family: More than three times a week    Frequency of Social Gatherings with Friends and Family: More than three times a week    Attends Religious Services: More than 4 times per year    Active Member of Clubs or Organizations: No    Attends Banker Meetings: Never    Marital Status: Married  Housing Stability: Unknown (01/06/2024)   Housing Stability Vital Sign    Homeless in the Last Year: No    Objective:    Vitals:   01/06/24 1012  BP: (!) 148/88  Pulse: 78  Temp: 36.7 C (98.1 F)  SpO2: 99%  Weight: (!) 105.9 kg (233 lb 6.4 oz)  Height: 180.3 cm (5' 11)  PainSc: 0-No pain    Body mass index is 32.55 kg/m.  Gen: A&Ox3, no distress  Unlabored respirations  Assessment and Plan:  Diagnoses and all orders for this visit:  Small bowel mass -     Cancer Antigen 19-9 (CA 19-9); Future -     Carcinoembryonic Antigen (CEA); Future -     Complete Blood Count (CBC) -     Comprehensive Metabolic Panel (CMP)    We discussed unclear what this is, but likely slow-growing given that the largest lesion was present and is very slightly increased in size over the last 9 years.  I recommend  proceeding with a laparoscopic assisted small bowel resection and resection of the mesenteric mass for pathological examination.  We discussed the procedure and risks including bleeding, infection, pain, scarring, injury to other intra-abdominal structures, anastomotic leak/bleed/stricture, obstruction, incomplete resection and need for additional procedures.  Pending pathology results further plans can be made.  He would like to proceed to soon as possible.  We will check tumor markers and plan to proceed in the next few weeks.  Yardley Beltran ALAN FREUND, MD

## 2024-01-06 NOTE — Progress Notes (Signed)
 Cardiology Office Note:    Date:  01/07/2024   ID:  Lee Lee, DOB 08-12-1951, MRN [id number]  PCP:  Joshua Debby CROME, MD  Cardiologist:  None  Electrophysiologist:  None   Referring MD: Joshua Debby CROME, MD   Chief Complaint  Patient presents with   Pre-op Exam         History of Present Illness:    Lee Lee is a 73 y.o. male with a hx of atrial flutter status post ablation, atrial tachycardia, hypertension who presents for DOD appointment for preop evaluation.  He follows with Dr. Ladona, last seen 07/2022.    Exercise Myoview  06/2021 showed fair exercise capacity (7 METS), normal perfusion, EF 57%.  Echocardiogram 07/2022 showed EF 55 to 60%, dilated aortic root measuring 41 mm.  Zio patch x 3 days 11/2022 showed frequent PACs (5% of beats), 1 episode of NSVT lasting 4 beats, 5 episodes of SVT with longest lasting 7 beats.  He was found to have small bowel mass and was seen by surgery who recommended small bowel resection.  He was referred for urgent preop evaluation today  Denies any chest pain, dyspnea, lightheadedness, syncope, lower extremity edema, or palpitations.  He is active, reports he plays 54 holes of golf per week and will walk the course.  He denies any exertional symptoms.  Denies any recurrent A-fib/flutter on his Apple Watch.  Past Medical History:  Diagnosis Date   Alcohol abuse, episodic drinking behavior    Anxiety    Arthritis    back & knees   Atrial flutter (HCC)    s/p RFCA 01/05/13   Calcium  oxalate renal stones    Complication of anesthesia    makes him loopy   Depression    ED (erectile dysfunction)    Hemorrhoids    Hypertension     Past Surgical History:  Procedure Laterality Date   ABLATION OF DYSRHYTHMIC FOCUS  01/05/2013   ARTHROSCOPIC REPAIR ACL     ATRIAL FLUTTER ABLATION N/A 01/05/2013   Procedure: ATRIAL FLUTTER ABLATION;  Surgeon: Danelle LELON Birmingham, MD;  Location: [facility] CATH LAB;  Service: Cardiovascular;  Laterality: N/A;    COLONOSCOPY  11/2009   Dr. Rollin   ELECTROPHYSIOLOGIC STUDY N/A 07/04/2015   Procedure: A-Flutter Ablation;  Surgeon: Danelle LELON Birmingham, MD;  Location: [facility] INVASIVE CV LAB;  Service: Cardiovascular;  Laterality: N/A;   ROTATOR CUFF REPAIR     TOTAL KNEE ARTHROPLASTY Left 10/30/2019   Procedure: TOTAL KNEE ARTHROPLASTY;  Surgeon: Ernie Cough, MD;  Location: [facility] ORS;  Service: Orthopedics;  Laterality: Left;  70 mins    Current Medications: Current Meds  Medication Sig   b complex vitamins capsule Take 1 capsule by mouth daily.   busPIRone  (BUSPAR ) 5 MG tablet TAKE ONE TABLET BY MOUTH THREE TIMES A DAY   desvenlafaxine  (PRISTIQ ) 50 MG 24 hr tablet TAKE 1 TABLET BY MOUTH DAILY   gabapentin  (NEURONTIN ) 100 MG capsule Take 1 capsule (100 mg total) by mouth 2 (two) times daily.   losartan  (COZAAR ) 25 MG tablet Take 1 tablet (25 mg total) by mouth every evening.   rOPINIRole  (REQUIP  XL) 4 MG 24 hr tablet TAKE 1 TABLET (4MG  TOTAL) BY MOUTH AT BEDTIME. PER MD RETURN IN ABOUT 6 MONTHS (AROUND 11/09/2023).   tirzepatide  (MOUNJARO ) 7.5 MG/0.5ML Pen Inject 7.5 mg into the skin once a week.   traZODone  (DESYREL ) 150 MG tablet Take 1 tablet (150 mg total) by mouth at bedtime as needed. for  sleep     Allergies:   Patient has no allergy information on record.   Social History   Socioeconomic History   Marital status: Married    Spouse name: Darice   Number of children: 2   Years of education: Not on file   Highest education level: Not on file  Occupational History   Occupation: Retired  Tobacco Use   Smoking status: Former    Types: Cigars    Quit date: 2002    Years since quitting: 23.1    Passive exposure: Past   Smokeless tobacco: Current    Types: Chew   Tobacco comments:    chews tobacco when playing golf only  Vaping Use   Vaping status: Never Used  Substance and Sexual Activity   Alcohol use: Not Currently   Drug use: No   Sexual activity: Yes    Partners: Female    Comment: married   Other Topics Concern   Not on file  Social History Narrative   Lives with wife   Social Drivers of Health   Financial Resource Strain: Low Risk  (09/12/2023)   Overall Financial Resource Strain (CARDIA)    Difficulty of Paying Living Expenses: Not hard at all  Food Insecurity: No Food Insecurity (09/12/2023)   Hunger Vital Sign    Worried About Running Out of Food in the Last Year: Never true    Ran Out of Food in the Last Year: Never true  Transportation Needs: No Transportation Needs (09/12/2023)   PRAPARE - Administrator, Civil Service (Medical): No    Lack of Transportation (Non-Medical): No  Physical Activity: Sufficiently Active (09/12/2023)   Exercise Vital Sign    Days of Exercise per Week: 7 days    Minutes of Exercise per Session: 30 min  Stress: No Stress Concern Present (09/12/2023)   [employer] of Occupational Health - Occupational Stress Questionnaire    Feeling of Stress : Not at all  Social Connections: Moderately Integrated (09/12/2023)   Social Connection and Isolation Panel [NHANES]    Frequency of Communication with Friends and Family: More than three times a week    Frequency of Social Gatherings with Friends and Family: More than three times a week    Attends Religious Services: More than 4 times per year    Active Member of [employer] or Organizations: No    Attends Banker Meetings: Never    Marital Status: Married     Family History: The patient's family history includes Lymphoma in his mother; Restless legs syndrome in his mother; Valvular heart disease in his mother. There is no history of Neuropathy.  ROS:   Please see the history of present illness.     All other systems reviewed and are negative.  EKGs/Labs/Other Studies Reviewed:    The following studies were reviewed today:   EKG:   01/06/2024: Normal sinus rhythm, rate 76, no ST abnormalities  Recent Labs: 05/10/2023: ALT 59; BUN 14; Creatinine, Ser 0.80;  Hemoglobin 13.9; Platelets 176.0; Potassium 4.5; Pro B Natriuretic peptide (BNP) 90.0; Sodium 139  Recent Lipid Panel    Component Value Date/Time   CHOL 93 08/31/2022 1157   CHOL 150 08/17/2021 0833   TRIG 66.0 08/31/2022 1157   HDL 43.90 08/31/2022 1157   HDL 73 08/17/2021 0833   CHOLHDL 2 08/31/2022 1157   VLDL 13.2 08/31/2022 1157   LDLCALC 36 08/31/2022 1157   LDLCALC 63 08/17/2021 0833   LDLDIRECT 101.0 12/26/2018 1342  Physical Exam:    VS:  BP 132/78   Pulse 76   Ht 5' 11 (1.803 m)   Wt 234 lb 6.4 oz (106.3 kg)   SpO2 94%   BMI 32.69 kg/m     Wt Readings from Last 3 Encounters:  01/06/24 234 lb 6.4 oz (106.3 kg)  09/12/23 223 lb (101.2 kg)  05/10/23 221 lb (100.2 kg)     GEN:  Well nourished, well developed in no acute distress HEENT: Normal NECK: No JVD; No carotid bruits LYMPHATICS: No lymphadenopathy CARDIAC: RRR, no murmurs, rubs, gallops RESPIRATORY:  Clear to auscultation without rales, wheezing or rhonchi  ABDOMEN: Soft, non-tender, non-distended MUSCULOSKELETAL:  No edema; No deformity  SKIN: Warm and dry NEUROLOGIC:  Alert and oriented x 3 PSYCHIATRIC:  Normal affect   ASSESSMENT:    1. Pre-op evaluation   2. Atrial flutter, unspecified type (HCC)   3. Essential hypertension    PLAN:    Preop evaluation: Prior to small bowel mass resection.  Good functional capacity, greater than 4 METS.  Denies any exertional symptoms.  RCRI score 0.  Overall would classify as low risk for intermediate risk surgery and no further cardiac workup recommended  Atrial flutter: Status post ablation x 2.  Monitors with Apple Watch, no evidence of recurrence.  Not on anticoagulation  Hypertension: On losartan  25 mg daily.  Appears controlled.  Follow-up in 6 months with Dr. Ganji  Medication Adjustments/Labs and Tests Ordered: Current medicines are reviewed at length with the patient today.  Concerns regarding medicines are outlined above.  Orders Placed  This Encounter  Procedures   EKG 12-Lead   No orders of the defined types were placed in this encounter.   Patient Instructions  Medication Instructions:  Continue current medications *If you need a refill on your cardiac medications before your next appointment, please call your pharmacy*   Lab Work: none If you have labs (blood work) drawn today and your tests are completely normal, you will receive your results only by: MyChart Message (if you have MyChart) OR A paper copy in the mail If you have any lab test that is abnormal or we need to change your treatment, we will call you to review the results.   Testing/Procedures: none   Follow-Up: At [facility], you and your health needs are our priority.  As part of our continuing mission to provide you with exceptional heart care, we have created designated Provider Care Teams.  These Care Teams include your primary Cardiologist (physician) and Advanced Practice Providers (APPs -  Physician Assistants and Nurse Practitioners) who all work together to provide you with the care you need, when you need it.  We recommend signing up for the patient portal called MyChart.  Sign up information is provided on this After Visit Summary.  MyChart is used to connect with patients for Virtual Visits (Telemedicine).  Patients are able to view lab/test results, encounter notes, upcoming appointments, etc.  Non-urgent messages can be sent to your provider as well.   To learn more about what you can do with MyChart, go to [email or web address].    Your next appointment:   6 month(s)  Provider:   Dr. Ladona  Other Instructions none        Signed, Lonni LITTIE Nanas, MD  01/07/2024 8:22 PM    [facility]

## 2024-01-09 DIAGNOSIS — K6389 Other specified diseases of intestine: Secondary | ICD-10-CM | POA: Diagnosis not present

## 2024-01-12 DIAGNOSIS — R200 Anesthesia of skin: Secondary | ICD-10-CM | POA: Diagnosis not present

## 2024-01-12 DIAGNOSIS — R202 Paresthesia of skin: Secondary | ICD-10-CM | POA: Diagnosis not present

## 2024-01-16 NOTE — Progress Notes (Signed)
Anesthesia Review:  PCP: Marcello Moores LOV 05/10/23  Cardiologist : Bjorn Pippin proep 01/06/24  Chest x-ray : EKG :01/06/24  Echo :08/11/22  Stress test: Cardiac Cath :  Activity level:  Sleep Study/ CPAP : Fasting Blood Sugar :      / Checks Blood Sugar -- times a day:   Blood Thinner/ Instructions /Last Dose: ASA / Instructions/ Last Dose :

## 2024-01-17 ENCOUNTER — Other Ambulatory Visit: Payer: PPO

## 2024-01-17 DIAGNOSIS — M545 Low back pain, unspecified: Secondary | ICD-10-CM | POA: Diagnosis not present

## 2024-01-17 DIAGNOSIS — M542 Cervicalgia: Secondary | ICD-10-CM | POA: Diagnosis not present

## 2024-01-17 NOTE — Patient Instructions (Addendum)
SURGICAL WAITING ROOM VISITATION  Patients having surgery or a procedure may have no more than 2 support people in the waiting area - these visitors may rotate.    Children under the age of 37 must have an adult with them who is not the patient.  Due to an increase in RSV and influenza rates and associated hospitalizations, children ages 46 and under may not visit patients in [facility] hospitals.  Visitors with respiratory illnesses are discouraged from visiting and should remain at home.  If the patient needs to stay at the hospital during part of their recovery, the visitor guidelines for inpatient rooms apply. Pre-op nurse will coordinate an appropriate time for 1 support person to accompany patient in pre-op.  This support person may not rotate.    Please refer to the [facility] website for the visitor guidelines for Inpatients (after your surgery is over and you are in a regular room).       Your procedure is scheduled on:  01/27/2024    Report to [facility] Main Entrance    Report to admitting at 0730 AM   Call this number if you have problems the morning of surgery [phone] Follow Bowel Prep Instructions per MD>     After Midnight you may have the following liquids until __ 0630____ AM DAY OF SURGERY  Water Non-Citrus Juices (without pulp, NO RED-Apple, White grape, White cranberry) Black Coffee (NO MILK/CREAM OR CREAMERS, sugar ok)  Clear Tea (NO MILK/CREAM OR CREAMERS, sugar ok) regular and decaf                             Plain Jell-O (NO RED)                                           Fruit ices (not with fruit pulp, NO RED)                                     Popsicles (NO RED)                                                               Sports drinks like Gatorade (NO RED)              Drink 2 Ensure/G2 drinks AT 10:00 PM the night before surgery.        The day of surgery:  Drink ONE (1) Pre-Surgery Clear Ensure or G2 at 0630 AM  ( have  completed by ) the morning of surgery. Drink in one sitting. Do not sip.  This drink was given to you during your hospital  pre-op appointment visit. Nothing else to drink after completing the  Pre-Surgery Clear Ensure or G2.          If you have questions, please contact your surgeon's office.   FOLLOW BOWEL PREP AND ANY ADDITIONAL PRE OP INSTRUCTIONS YOU RECEIVED FROM YOUR SURGEON'S OFFICE!!!     Oral Hygiene is also important to reduce your risk of infection.  Remember - BRUSH YOUR TEETH THE MORNING OF SURGERY WITH YOUR REGULAR TOOTHPASTE  DENTURES WILL BE REMOVED PRIOR TO SURGERY PLEASE DO NOT APPLY "Poly grip" OR ADHESIVES!!!   Do NOT smoke after Midnight   Stop all vitamins and herbal supplements 7 days before surgery.   Take these medicines the morning of surgery with A SIP OF WATER:   Buspar, Pristiq gabapentin               Moounjaro- Last dose on 01/15/2024   DO NOT TAKE ANY ORAL DIABETIC MEDICATIONS DAY OF YOUR SURGERY  Bring CPAP mask and tubing day of surgery.                              You may not have any metal on your body including hair pins, jewelry, and body piercing             Do not wear make-up, lotions, powders, perfumes/cologne, or deodorant  Do not wear nail polish including gel and S&S, artificial/acrylic nails, or any other type of covering on natural nails including finger and toenails. If you have artificial nails, gel coating, etc. that needs to be removed by a nail salon please have this removed prior to surgery or surgery may need to be canceled/ delayed if the surgeon/ anesthesia feels like they are unable to be safely monitored.   Do not shave  48 hours prior to surgery.               Men may shave face and neck.   Do not bring valuables to the hospital. [facility] IS NOT             RESPONSIBLE   FOR VALUABLES.   Contacts, glasses, dentures or bridgework may not be worn into surgery.   Bring small  overnight bag day of surgery.   DO NOT BRING YOUR HOME MEDICATIONS TO THE HOSPITAL. PHARMACY WILL DISPENSE MEDICATIONS LISTED ON YOUR MEDICATION LIST TO YOU DURING YOUR ADMISSION IN THE HOSPITAL!    Patients discharged on the day of surgery will not be allowed to drive home.  Someone NEEDS to stay with you for the first 24 hours after anesthesia.   Special Instructions: Bring a copy of your healthcare power of attorney and living will documents the day of surgery if you haven't scanned them before.              Please read over the following fact sheets you were given: IF YOU HAVE QUESTIONS ABOUT YOUR PRE-OP INSTRUCTIONS PLEASE CALL [phone]   If you received a COVID test during your pre-op visit  it is requested that you wear a mask when out in public, stay away from anyone that may not be feeling well and notify your surgeon if you develop symptoms. If you test positive for Covid or have been in contact with anyone that has tested positive in the last 10 days please notify you surgeon.    [facility] - Preparing for Surgery Before surgery, you can play an important role.  Because skin is not sterile, your skin needs to be as free of germs as possible.  You can reduce the number of germs on your skin by washing with CHG (chlorahexidine gluconate) soap before surgery.  CHG is an antiseptic cleaner which kills germs and bonds with the skin to continue killing germs even after washing. Please DO NOT use if you have an  allergy to CHG or antibacterial soaps.  If your skin becomes reddened/irritated stop using the CHG and inform your nurse when you arrive at Short Stay. Do not shave (including legs and underarms) for at least 48 hours prior to the first CHG shower.  You may shave your face/neck. Please follow these instructions carefully:  1.  Shower with CHG Soap the night before surgery and the  morning of Surgery.  2.  If you choose to wash your hair, wash your hair first as usual with your   normal  shampoo.  3.  After you shampoo, rinse your hair and body thoroughly to remove the  shampoo.                           4.  Use CHG as you would any other liquid soap.  You can apply chg directly  to the skin and wash                       Gently with a scrungie or clean washcloth.  5.  Apply the CHG Soap to your body ONLY FROM THE NECK DOWN.   Do not use on face/ open                           Wound or open sores. Avoid contact with eyes, ears mouth and genitals (private parts).                       Wash face,  Genitals (private parts) with your normal soap.             6.  Wash thoroughly, paying special attention to the area where your surgery  will be performed.  7.  Thoroughly rinse your body with warm water from the neck down.  8.  DO NOT shower/wash with your normal soap after using and rinsing off  the CHG Soap.                9.  Pat yourself dry with a clean towel.            10.  Wear clean pajamas.            11.  Place clean sheets on your bed the night of your first shower and do not  sleep with pets. Day of Surgery : Do not apply any lotions/deodorants the morning of surgery.  Please wear clean clothes to the hospital/surgery center.  FAILURE TO FOLLOW THESE INSTRUCTIONS MAY RESULT IN THE CANCELLATION OF YOUR SURGERY PATIENT SIGNATURE_________________________________  NURSE SIGNATURE__________________________________  ________________________________________________________________________

## 2024-01-18 ENCOUNTER — Other Ambulatory Visit: Payer: Self-pay

## 2024-01-18 ENCOUNTER — Encounter (HOSPITAL_COMMUNITY)
Admission: RE | Admit: 2024-01-18 | Discharge: 2024-01-18 | Disposition: A | Discharge status: Home / Self Care | Payer: PPO | Source: Ambulatory Visit | Attending: Surgery | Admitting: Surgery

## 2024-01-18 ENCOUNTER — Encounter (HOSPITAL_COMMUNITY): Payer: Self-pay

## 2024-01-18 VITALS — BP 126/85 | HR 72 | Temp 98.2°F | Resp 16 | Ht 71.0 in | Wt 220.0 lb

## 2024-01-18 DIAGNOSIS — Z87891 Personal history of nicotine dependence: Secondary | ICD-10-CM | POA: Insufficient documentation

## 2024-01-18 DIAGNOSIS — Z01818 Encounter for other preprocedural examination: Secondary | ICD-10-CM | POA: Diagnosis not present

## 2024-01-18 HISTORY — DX: Personal history of urinary calculi: Z87.442

## 2024-01-18 HISTORY — DX: Prediabetes: R73.03

## 2024-01-18 HISTORY — DX: Malignant (primary) neoplasm, unspecified: C80.1

## 2024-01-18 LAB — CBC
HCT: 46.8 % (ref 39.0–52.0)
Hemoglobin: 15.9 g/dL (ref 13.0–17.0)
MCH: 35.8 pg — ABNORMAL HIGH (ref 26.0–34.0)
MCHC: 34 g/dL (ref 30.0–36.0)
MCV: 105.4 fL — ABNORMAL HIGH (ref 80.0–100.0)
Platelets: 134 10*3/uL — ABNORMAL LOW (ref 150–400)
RBC: 4.44 MIL/uL (ref 4.22–5.81)
RDW: 12.9 % (ref 11.5–15.5)
WBC: 7.2 10*3/uL (ref 4.0–10.5)
nRBC: 0 % (ref 0.0–0.2)

## 2024-01-18 LAB — BASIC METABOLIC PANEL
Anion gap: 9 (ref 5–15)
BUN: 15 mg/dL (ref 8–23)
CO2: 28 mmol/L (ref 22–32)
Calcium: 9.4 mg/dL (ref 8.9–10.3)
Chloride: 100 mmol/L (ref 98–111)
Creatinine, Ser: 0.74 mg/dL (ref 0.61–1.24)
GFR, Estimated: >60 mL/min (ref >60)
Glucose, Bld: 114 mg/dL — ABNORMAL HIGH (ref 70–99)
Potassium: 3.9 mmol/L (ref 3.5–5.1)
Sodium: 137 mmol/L (ref 135–145)

## 2024-01-19 ENCOUNTER — Encounter (HOSPITAL_COMMUNITY): Payer: Self-pay

## 2024-01-19 NOTE — Progress Notes (Signed)
Case: [id number] Date/Time: 01/27/24 0900   Procedure: LAPARASCOPIC ASSISTED SMALL BOWEL RESECTION - 90 MINUTES   Anesthesia type: General   Pre-op diagnosis: SMALL BOWEL, MESENTERIC MASS   Location[facility] ROOM 01 / [facility]   Surgeons: Berna Bue, MD       DISCUSSION: Lee Lee is a 73 yo male who presents to PAT prior to surgery above. PMH of former smoking, HTN, A.flutter s/p ablation in 2014, pre diabetes, hx of ETOH abuse, anxiety, depression.  Patient follows with Cardiology for hx of A.flutter s/p ablation. He is off anticoagulation. He was seen for pre op eval on 01/06/24. Prior w/u includes Exercise Myoview 06/2021 showed fair exercise capacity (7 METS), normal perfusion, EF 57%.  Echocardiogram 07/2022 showed EF 55 to 60%, dilated aortic root measuring 41 mm.  Zio patch x 3 days 11/2022 showed frequent PACs (5% of beats), 1 episode of NSVT lasting 4 beats, 5 episodes of SVT with longest lasting 7 beats. Cleared for surgery:  "Preop evaluation: Prior to small bowel mass resection.  Good functional capacity, greater than 4 METS.  Denies any exertional symptoms.  RCRI score 0.  Overall would classify as low risk for intermediate risk surgery and no further cardiac workup recommended"  Mounjaro- Last dose on 01/15/24   VS: BP 126/85   Pulse 72   Temp 36.8 C (Oral)   Resp 16   Ht 5\' 11"  (1.803 m)   Wt 99.8 kg   SpO2 99%   BMI 30.68 kg/m   PROVIDERS: Etta Grandchild, MD   LABS: Labs reviewed: Acceptable for surgery. (all labs ordered are listed, but only abnormal results are displayed)  Labs Reviewed  CBC - Abnormal; Notable for the following components:      Result Value   MCV 105.4 (*)    MCH 35.8 (*)    Platelets 134 (*)    All other components within normal limits  BASIC METABOLIC PANEL - Abnormal; Notable for the following components:   Glucose, Bld 114 (*)    All other components within normal limits  TYPE AND SCREEN     IMAGES:   EKG 01/06/24  Normal  sinus rhythm, rate 76 Cannot rule out Inferior infarct , age undetermined  CV:  Zio patch 12/17/2022:  Rhythm  Sinus rhythm  Rate 58 to 116 bpm   Average HR 79 bpm   Rare PVC, frequent  PAC (5%_   SHort bursts of SVT, fastest for 6 beats at 207 bpm, longest 7 beats at 139 bpm.  One 4 beat NSVT at 128 bpm Triggered events correlated with SR with PACs SR with PAC and PVC.     Patient had a min HR of 58 bpm, max HR of 207 bpm, and avg HR of 79 bpm. Predominant underlying rhythm was Sinus Rhythm. 1 run of Ventricular Tachycardia occurred lasting 4 beats with a max rate of 128 bpm (avg 120 bpm). 5 Supraventricular Tachycardia  runs occurred, the run with the fastest interval lasting 6 beats with a max rate of 207 bpm, the longest lasting 7 beats with an avg rate of 139 bpm. Isolated SVEs were frequent (5.0%, 20009), SVE Couplets were rare (<1.0%, 138), and SVE Triplets were  rare (<1.0%, 17). Isolated VEs were rare (<1.0%, 3447), VE Couplets were rare (<1.0%, 90), and VE Triplets were rare (<1.0%, 3). Ventricular Bigeminy and Trigeminy were present.    Echocardiogram 08/11/2022:  Normal LV systolic function with visual EF 55-60%. Left ventricle cavity is  normal in size. Mild concentric hypertrophy of the left ventricle. Normal global wall motion. Normal diastolic filling pattern, normal LAP. Calculated EF 54%. Left atrial cavity is mildly dilated at 4.4 cm. Structurally normal tricuspid valve with no regurgitation. No evidence of pulmonary hypertension. The aortic root is dilated. no significant change compared to prior.  Stress test 07/01/2021:  Fair exercise capacity, achieved 7 METS Peak HR 141 bpm, 92% max predicted heart rate No EKG evidence of ischemia The left ventricular ejection fraction is normal (55-65%). Nuclear stress EF: 57%. The study is normal. This is a low risk study.   Past Medical History:  Diagnosis Date   Alcohol abuse, episodic drinking behavior    Anxiety     Arthritis    back & knees   Atrial flutter (HCC)    s/p RFCA 01/05/13   Cancer (HCC)    skin cancer on nose   Complication of anesthesia    makes him loopy   Depression    ED (erectile dysfunction)    Hemorrhoids    History of kidney stones    Hypertension    Pre-diabetes     Past Surgical History:  Procedure Laterality Date   ABLATION OF DYSRHYTHMIC FOCUS  01/05/2013   ARTHROSCOPIC REPAIR ACL     ATRIAL FLUTTER ABLATION N/A 01/05/2013   Procedure: ATRIAL FLUTTER ABLATION;  Surgeon: Marinus Maw, MD;  Location[facility] C[facility] LAB;  Service: Cardiovascular;  Laterality: N/A;   COLONOSCOPY  11/2009   Dr. Elnoria Howard   ELECTROPHYSIOLOGIC STUDY N/A 07/04/2015   Procedure: A-Flutter Ablation;  Surgeon: Marinus Maw, MD;  Location: [facility] INVASIVE CV LAB;  Service: Cardiovascular;  Laterality: N/A;   right knee reconstruction     ROTATOR CUFF REPAIR     TOTAL KNEE ARTHROPLASTY Left 10/30/2019   Procedure: TOTAL KNEE ARTHROPLASTY;  Surgeon: Durene Romans, MD;  Location: [facility];  Service: Orthopedics;  Laterality: Left;  70 mins    MEDICATIONS:  acetaminophen (TYLENOL) 500 MG tablet   b complex vitamins capsule   busPIRone (BUSPAR) 5 MG tablet   desvenlafaxine (PRISTIQ) 50 MG 24 hr tablet   gabapentin (NEURONTIN) 100 MG capsule   gabapentin (NEURONTIN) 300 MG capsule   losartan (COZAAR) 25 MG tablet   rOPINIRole (REQUIP XL) 4 MG 24 hr tablet   tirzepatide (MOUNJARO) 7.5 MG/0.5ML Pen   traZODone (DESYREL) 150 MG tablet   No current facility-administered medications for this encounter.   Marcille Blanco [facility] Short Stay/Anesthesiology [facility] Phone [phone] 01/19/2024 2:30 PM

## 2024-01-24 DIAGNOSIS — M542 Cervicalgia: Secondary | ICD-10-CM | POA: Diagnosis not present

## 2024-01-24 DIAGNOSIS — M545 Low back pain, unspecified: Secondary | ICD-10-CM | POA: Diagnosis not present

## 2024-01-26 NOTE — Anesthesia Preprocedure Evaluation (Signed)
 Anesthesia Evaluation  Patient identified by MRN, date of birth, ID band Patient awake    Reviewed: Allergy & Precautions, NPO status , Patient's Chart, lab work & pertinent test results  Airway Mallampati: IV  TM Distance: >3 FB Neck ROM: Full    Dental  (+) Teeth Intact, Dental Advisory Given   Pulmonary former smoker Per pt negative sleep study 5 years ago   Pulmonary exam normal breath sounds clear to auscultation       Cardiovascular hypertension (170/99 preop), Pt. on medications + Peripheral Vascular Disease  Normal cardiovascular exam+ dysrhythmias (no a/c s/p ablation 2014) Atrial Fibrillation  Rhythm:Regular Rate:Normal  Echo 2020  1. The left ventricle has normal systolic function with an ejection  fraction of 60-65%. The cavity size was normal. Left ventricular diastolic  Doppler parameters are consistent with pseudonormalization.   2. The right ventricle has normal systolic function. The cavity was  normal. There is no increase in right ventricular wall thickness.   3. Left atrial size was mildly dilated.   4. There is mild mitral annular calcification present.   5. There is mild dilatation of the ascending aorta measuring 44 mm.     Neuro/Psych  PSYCHIATRIC DISORDERS Anxiety Depression    negative neurological ROS     GI/Hepatic negative GI ROS, Neg liver ROS,,,  Endo/Other  diabetes (prediabetic)  BMI 30  Renal/GU negative Renal ROS  negative genitourinary   Musculoskeletal  (+) Arthritis , Osteoarthritis,    Abdominal  (+) + obese  Peds  Hematology negative hematology ROS (+) Hb 15.9, plt 134   Anesthesia Other Findings Mounjaro LD: 2/16 (>7d)  Reproductive/Obstetrics negative OB ROS                             Anesthesia Physical Anesthesia Plan  ASA: 2  Anesthesia Plan: General   Post-op Pain Management: Tylenol PO (pre-op)* and Ketamine IV*   Induction:  Intravenous  PONV Risk Score and Plan: 2 and Ondansetron, Dexamethasone, Treatment may vary due to age or medical condition and Midazolam  Airway Management Planned: Oral ETT and Video Laryngoscope Planned  Additional Equipment: None  Intra-op Plan:   Post-operative Plan: Extubation in OR  Informed Consent: I have reviewed the patients History and Physical, chart, labs and discussed the procedure including the risks, benefits and alternatives for the proposed anesthesia with the patient or authorized representative who has indicated his/her understanding and acceptance.     Dental advisory given  Plan Discussed with: CRNA  Anesthesia Plan Comments: (No prior intubations on record, non-reassuring airway- will go directly to glidescope)        Anesthesia Quick Evaluation

## 2024-01-27 ENCOUNTER — Other Ambulatory Visit: Payer: Self-pay

## 2024-01-27 ENCOUNTER — Encounter (HOSPITAL_COMMUNITY): Payer: Self-pay | Admitting: Surgery

## 2024-01-27 ENCOUNTER — Inpatient Hospital Stay (HOSPITAL_COMMUNITY)
Admission: RE | Admit: 2024-01-27 | Discharge: 2024-01-31 | DRG: 330 | Disposition: A | Discharge status: Home / Self Care | Payer: PPO | Source: Ambulatory Visit | Attending: Surgery | Admitting: Surgery

## 2024-01-27 ENCOUNTER — Inpatient Hospital Stay (HOSPITAL_COMMUNITY): Payer: Self-pay | Admitting: Anesthesiology

## 2024-01-27 ENCOUNTER — Inpatient Hospital Stay (HOSPITAL_COMMUNITY): Payer: PPO | Admitting: Medical

## 2024-01-27 ENCOUNTER — Encounter (HOSPITAL_COMMUNITY)
Admission: RE | Disposition: A | Discharge status: Home / Self Care | Payer: Self-pay | Source: Ambulatory Visit | Attending: Surgery

## 2024-01-27 DIAGNOSIS — Z8249 Family history of ischemic heart disease and other diseases of the circulatory system: Secondary | ICD-10-CM | POA: Diagnosis not present

## 2024-01-27 DIAGNOSIS — C260 Malignant neoplasm of intestinal tract, part unspecified: Secondary | ICD-10-CM | POA: Diagnosis not present

## 2024-01-27 DIAGNOSIS — Z803 Family history of malignant neoplasm of breast: Secondary | ICD-10-CM

## 2024-01-27 DIAGNOSIS — N400 Enlarged prostate without lower urinary tract symptoms: Secondary | ICD-10-CM | POA: Diagnosis present

## 2024-01-27 DIAGNOSIS — Z79899 Other long term (current) drug therapy: Secondary | ICD-10-CM | POA: Diagnosis not present

## 2024-01-27 DIAGNOSIS — K639 Disease of intestine, unspecified: Secondary | ICD-10-CM | POA: Diagnosis not present

## 2024-01-27 DIAGNOSIS — Z8349 Family history of other endocrine, nutritional and metabolic diseases: Secondary | ICD-10-CM

## 2024-01-27 DIAGNOSIS — D3A00 Benign carcinoid tumor of unspecified site: Secondary | ICD-10-CM | POA: Diagnosis present

## 2024-01-27 DIAGNOSIS — K760 Fatty (change of) liver, not elsewhere classified: Secondary | ICD-10-CM | POA: Diagnosis present

## 2024-01-27 DIAGNOSIS — C7A8 Other malignant neuroendocrine tumors: Secondary | ICD-10-CM | POA: Diagnosis not present

## 2024-01-27 DIAGNOSIS — I10 Essential (primary) hypertension: Secondary | ICD-10-CM | POA: Diagnosis present

## 2024-01-27 DIAGNOSIS — Z7985 Long-term (current) use of injectable non-insulin antidiabetic drugs: Secondary | ICD-10-CM | POA: Diagnosis not present

## 2024-01-27 DIAGNOSIS — C7B01 Secondary carcinoid tumors of distant lymph nodes: Secondary | ICD-10-CM | POA: Diagnosis not present

## 2024-01-27 DIAGNOSIS — I7121 Aneurysm of the ascending aorta, without rupture: Secondary | ICD-10-CM | POA: Diagnosis present

## 2024-01-27 DIAGNOSIS — Z87891 Personal history of nicotine dependence: Secondary | ICD-10-CM

## 2024-01-27 DIAGNOSIS — I700 Atherosclerosis of aorta: Secondary | ICD-10-CM | POA: Diagnosis not present

## 2024-01-27 DIAGNOSIS — E1151 Type 2 diabetes mellitus with diabetic peripheral angiopathy without gangrene: Secondary | ICD-10-CM | POA: Diagnosis present

## 2024-01-27 DIAGNOSIS — G2581 Restless legs syndrome: Secondary | ICD-10-CM | POA: Diagnosis present

## 2024-01-27 DIAGNOSIS — I2510 Atherosclerotic heart disease of native coronary artery without angina pectoris: Secondary | ICD-10-CM | POA: Diagnosis not present

## 2024-01-27 DIAGNOSIS — C7A012 Malignant carcinoid tumor of the ileum: Principal | ICD-10-CM | POA: Diagnosis present

## 2024-01-27 DIAGNOSIS — Z9049 Acquired absence of other specified parts of digestive tract: Principal | ICD-10-CM

## 2024-01-27 DIAGNOSIS — Z800 Family history of malignant neoplasm of digestive organs: Secondary | ICD-10-CM

## 2024-01-27 DIAGNOSIS — E781 Pure hyperglyceridemia: Secondary | ICD-10-CM | POA: Diagnosis present

## 2024-01-27 DIAGNOSIS — I4891 Unspecified atrial fibrillation: Secondary | ICD-10-CM

## 2024-01-27 DIAGNOSIS — E119 Type 2 diabetes mellitus without complications: Secondary | ICD-10-CM | POA: Diagnosis not present

## 2024-01-27 DIAGNOSIS — Z96652 Presence of left artificial knee joint: Secondary | ICD-10-CM | POA: Diagnosis not present

## 2024-01-27 DIAGNOSIS — D62 Acute posthemorrhagic anemia: Secondary | ICD-10-CM | POA: Diagnosis not present

## 2024-01-27 DIAGNOSIS — Z01818 Encounter for other preprocedural examination: Secondary | ICD-10-CM

## 2024-01-27 DIAGNOSIS — K654 Sclerosing mesenteritis: Secondary | ICD-10-CM | POA: Diagnosis not present

## 2024-01-27 HISTORY — PX: XI ROBOTIC ASSISTED SMALL BOWEL RESECTION: SHX6872

## 2024-01-27 LAB — TYPE AND SCREEN
ABO/RH(D): A NEG
Antibody Screen: NEGATIVE

## 2024-01-27 LAB — GLUCOSE, CAPILLARY: Glucose-Capillary: 189 mg/dL — ABNORMAL HIGH (ref 70–99)

## 2024-01-27 SURGERY — EXCISION, SMALL INTESTINE, ROBOT-ASSISTED
Anesthesia: General

## 2024-01-27 MED ORDER — TRAZODONE HCL 50 MG PO TABS
150.0000 mg | ORAL_TABLET | Freq: Every evening | ORAL | Status: DC | PRN
  Administered 2024-01-27 – 2024-01-30 (×4): 150 mg via ORAL
  Filled 2024-01-27 (×4): qty 1

## 2024-01-27 MED ORDER — HYDRALAZINE HCL 20 MG/ML IJ SOLN
INTRAMUSCULAR | Status: DC | PRN
  Administered 2024-01-27: 4 mg via INTRAVENOUS

## 2024-01-27 MED ORDER — ACETAMINOPHEN 500 MG PO TABS
1000.0000 mg | ORAL_TABLET | Freq: Four times a day (QID) | ORAL | Status: DC
  Administered 2024-01-27 – 2024-01-31 (×12): 1000 mg via ORAL
  Filled 2024-01-27 (×14): qty 2

## 2024-01-27 MED ORDER — ONDANSETRON HCL 4 MG/2ML IJ SOLN
INTRAMUSCULAR | Status: DC | PRN
  Administered 2024-01-27: 4 mg via INTRAVENOUS

## 2024-01-27 MED ORDER — HEPARIN SODIUM (PORCINE) 5000 UNIT/ML IJ SOLN
5000.0000 [IU] | Freq: Three times a day (TID) | INTRAMUSCULAR | Status: DC
  Administered 2024-01-27 – 2024-01-29 (×5): 5000 [IU] via SUBCUTANEOUS
  Filled 2024-01-27 (×5): qty 1

## 2024-01-27 MED ORDER — MIDAZOLAM HCL 2 MG/2ML IJ SOLN
INTRAMUSCULAR | Status: AC
  Filled 2024-01-27: qty 2

## 2024-01-27 MED ORDER — CHLORHEXIDINE GLUCONATE 4 % EX SOLN: 60.0000 mL | Freq: Once | CUTANEOUS | Status: DC

## 2024-01-27 MED ORDER — HYDROMORPHONE HCL 1 MG/ML IJ SOLN
0.5000 mg | INTRAMUSCULAR | Status: DC | PRN
  Administered 2024-01-27 – 2024-01-28 (×3): 0.5 mg via INTRAVENOUS
  Filled 2024-01-27 (×3): qty 0.5

## 2024-01-27 MED ORDER — ONDANSETRON HCL 4 MG/2ML IJ SOLN
INTRAMUSCULAR | Status: AC
Start: 2024-01-27 — End: 2024-01-27
  Filled 2024-01-27: qty 2

## 2024-01-27 MED ORDER — INSULIN ASPART 100 UNIT/ML IJ SOLN: 0.0000 [IU] | Freq: Every day | INTRAMUSCULAR | Status: DC

## 2024-01-27 MED ORDER — KETAMINE HCL 50 MG/5ML IJ SOSY
PREFILLED_SYRINGE | INTRAMUSCULAR | Status: AC
  Filled 2024-01-27: qty 5

## 2024-01-27 MED ORDER — HYDROMORPHONE HCL 2 MG/ML IJ SOLN
INTRAMUSCULAR | Status: AC
  Filled 2024-01-27: qty 1

## 2024-01-27 MED ORDER — DEXAMETHASONE SODIUM PHOSPHATE 4 MG/ML IJ SOLN
INTRAMUSCULAR | Status: DC | PRN
Start: 2024-01-27 — End: 2024-01-27
  Administered 2024-01-27: 5 mg via INTRAVENOUS

## 2024-01-27 MED ORDER — GABAPENTIN 300 MG PO CAPS
300.0000 mg | ORAL_CAPSULE | ORAL | Status: AC
  Administered 2024-01-27: 300 mg via ORAL
  Filled 2024-01-27: qty 1

## 2024-01-27 MED ORDER — OXYCODONE HCL 5 MG PO TABS: 5.0000 mg | ORAL_TABLET | Freq: Three times a day (TID) | ORAL | 0 refills | Status: AC | PRN

## 2024-01-27 MED ORDER — TRAMADOL HCL 50 MG PO TABS
50.0000 mg | ORAL_TABLET | Freq: Four times a day (QID) | ORAL | Status: DC | PRN
  Administered 2024-01-28 – 2024-01-29 (×3): 50 mg via ORAL
  Filled 2024-01-27 (×3): qty 1

## 2024-01-27 MED ORDER — LACTATED RINGERS IV SOLN
INTRAVENOUS | Status: DC
Start: 2024-01-27 — End: 2024-01-27

## 2024-01-27 MED ORDER — BUPIVACAINE LIPOSOME 1.3 % IJ SUSP: 20.0000 mL | Freq: Once | INTRAMUSCULAR | Status: DC

## 2024-01-27 MED ORDER — ONDANSETRON HCL 4 MG/2ML IJ SOLN
INTRAMUSCULAR | Status: AC
  Filled 2024-01-27: qty 2

## 2024-01-27 MED ORDER — GABAPENTIN 100 MG PO CAPS
300.0000 mg | ORAL_CAPSULE | Freq: Three times a day (TID) | ORAL | Status: DC
  Administered 2024-01-27 – 2024-01-31 (×12): 300 mg via ORAL
  Filled 2024-01-27 (×12): qty 3

## 2024-01-27 MED ORDER — MIDAZOLAM HCL 5 MG/5ML IJ SOLN
INTRAMUSCULAR | Status: DC | PRN
  Administered 2024-01-27: 2 mg via INTRAVENOUS

## 2024-01-27 MED ORDER — ALVIMOPAN 12 MG PO CAPS
12.0000 mg | ORAL_CAPSULE | ORAL | Status: AC
  Administered 2024-01-27: 12 mg via ORAL
  Filled 2024-01-27: qty 1

## 2024-01-27 MED ORDER — ACETAMINOPHEN 500 MG PO TABS
1000.0000 mg | ORAL_TABLET | Freq: Once | ORAL | Status: AC
  Administered 2024-01-27: 1000 mg via ORAL
  Filled 2024-01-27: qty 2

## 2024-01-27 MED ORDER — HYDRALAZINE HCL 20 MG/ML IJ SOLN
INTRAMUSCULAR | Status: AC
  Filled 2024-01-27: qty 1

## 2024-01-27 MED ORDER — HYDROMORPHONE HCL 1 MG/ML IJ SOLN
INTRAMUSCULAR | Status: AC
Start: 2024-01-27 — End: 2024-01-27
  Filled 2024-01-27: qty 1

## 2024-01-27 MED ORDER — HEMOSTATIC AGENTS (NO CHARGE) OPTIME
TOPICAL | Status: DC | PRN
  Administered 2024-01-27: 1 via TOPICAL

## 2024-01-27 MED ORDER — ONDANSETRON HCL 4 MG/2ML IJ SOLN
4.0000 mg | Freq: Once | INTRAMUSCULAR | Status: AC | PRN
  Administered 2024-01-27: 4 mg via INTRAVENOUS

## 2024-01-27 MED ORDER — DOCUSATE SODIUM 100 MG PO CAPS: 100.0000 mg | ORAL_CAPSULE | Freq: Two times a day (BID) | ORAL | 0 refills | Status: AC

## 2024-01-27 MED ORDER — DEXMEDETOMIDINE HCL IN NACL 80 MCG/20ML IV SOLN
INTRAVENOUS | Status: AC
  Filled 2024-01-27: qty 20

## 2024-01-27 MED ORDER — PROPOFOL 1000 MG/100ML IV EMUL
INTRAVENOUS | Status: AC
  Filled 2024-01-27: qty 100

## 2024-01-27 MED ORDER — HYDROMORPHONE HCL 1 MG/ML IJ SOLN
0.2500 mg | INTRAMUSCULAR | Status: DC | PRN
  Administered 2024-01-27: 0.5 mg via INTRAVENOUS
  Administered 2024-01-27: 0.25 mg via INTRAVENOUS

## 2024-01-27 MED ORDER — DOCUSATE SODIUM 100 MG PO CAPS
100.0000 mg | ORAL_CAPSULE | Freq: Two times a day (BID) | ORAL | Status: DC
  Administered 2024-01-27 – 2024-01-29 (×3): 100 mg via ORAL
  Filled 2024-01-27 (×4): qty 1

## 2024-01-27 MED ORDER — PROPOFOL 10 MG/ML IV BOLUS
INTRAVENOUS | Status: DC | PRN
  Administered 2024-01-27: 200 mg via INTRAVENOUS

## 2024-01-27 MED ORDER — OXYCODONE HCL 5 MG PO TABS
5.0000 mg | ORAL_TABLET | Freq: Once | ORAL | Status: AC | PRN
  Administered 2024-01-27: 5 mg via ORAL

## 2024-01-27 MED ORDER — BUSPIRONE HCL 5 MG PO TABS
5.0000 mg | ORAL_TABLET | Freq: Three times a day (TID) | ORAL | Status: DC
  Administered 2024-01-27 – 2024-01-31 (×12): 5 mg via ORAL
  Filled 2024-01-27 (×12): qty 1

## 2024-01-27 MED ORDER — FENTANYL CITRATE (PF) 100 MCG/2ML IJ SOLN
INTRAMUSCULAR | Status: DC | PRN
  Administered 2024-01-27 (×2): 50 ug via INTRAVENOUS

## 2024-01-27 MED ORDER — KETAMINE HCL 10 MG/ML IJ SOLN
INTRAMUSCULAR | Status: DC | PRN
  Administered 2024-01-27: 25 mg via INTRAVENOUS
  Administered 2024-01-27: 15 mg via INTRAVENOUS

## 2024-01-27 MED ORDER — METHOCARBAMOL 1000 MG/10ML IJ SOLN
500.0000 mg | Freq: Four times a day (QID) | INTRAMUSCULAR | Status: DC | PRN
  Administered 2024-01-28: 500 mg via INTRAVENOUS
  Filled 2024-01-27: qty 10

## 2024-01-27 MED ORDER — BISACODYL 10 MG RE SUPP: 10.0000 mg | Freq: Every day | RECTAL | Status: DC | PRN

## 2024-01-27 MED ORDER — ROCURONIUM BROMIDE 10 MG/ML (PF) SYRINGE
PREFILLED_SYRINGE | INTRAVENOUS | Status: AC
  Filled 2024-01-27: qty 10

## 2024-01-27 MED ORDER — BUPIVACAINE-EPINEPHRINE (PF) 0.25% -1:200000 IJ SOLN
INTRAMUSCULAR | Status: AC
  Filled 2024-01-27: qty 30

## 2024-01-27 MED ORDER — ONDANSETRON HCL 4 MG/2ML IJ SOLN: 4.0000 mg | Freq: Four times a day (QID) | INTRAMUSCULAR | Status: DC | PRN

## 2024-01-27 MED ORDER — SODIUM CHLORIDE 0.9 % IV SOLN: INTRAVENOUS | Status: DC

## 2024-01-27 MED ORDER — ONDANSETRON 4 MG PO TBDP: 4.0000 mg | ORAL_TABLET | Freq: Four times a day (QID) | ORAL | Status: DC | PRN

## 2024-01-27 MED ORDER — LOSARTAN POTASSIUM 25 MG PO TABS
25.0000 mg | ORAL_TABLET | Freq: Every evening | ORAL | Status: DC
  Administered 2024-01-28 – 2024-01-30 (×3): 25 mg via ORAL
  Filled 2024-01-27 (×3): qty 1

## 2024-01-27 MED ORDER — METOPROLOL TARTRATE 5 MG/5ML IV SOLN: 5.0000 mg | Freq: Four times a day (QID) | INTRAVENOUS | Status: DC | PRN

## 2024-01-27 MED ORDER — LIDOCAINE HCL (PF) 2 % IJ SOLN
INTRAMUSCULAR | Status: AC
  Filled 2024-01-27: qty 5

## 2024-01-27 MED ORDER — OXYCODONE HCL 5 MG/5ML PO SOLN: 5.0000 mg | Freq: Once | ORAL | Status: AC | PRN

## 2024-01-27 MED ORDER — ESMOLOL HCL 100 MG/10ML IV SOLN
INTRAVENOUS | Status: AC
  Filled 2024-01-27: qty 10

## 2024-01-27 MED ORDER — INSULIN ASPART 100 UNIT/ML IJ SOLN
0.0000 [IU] | Freq: Three times a day (TID) | INTRAMUSCULAR | Status: DC
  Administered 2024-01-29 (×2): 2 [IU] via SUBCUTANEOUS

## 2024-01-27 MED ORDER — AMISULPRIDE (ANTIEMETIC) 5 MG/2ML IV SOLN: 10.0000 mg | Freq: Once | INTRAVENOUS | Status: DC | PRN

## 2024-01-27 MED ORDER — SUGAMMADEX SODIUM 200 MG/2ML IV SOLN
INTRAVENOUS | Status: DC | PRN
Start: 2024-01-27 — End: 2024-01-27
  Administered 2024-01-27: 200 mg via INTRAVENOUS

## 2024-01-27 MED ORDER — DIPHENHYDRAMINE HCL 50 MG/ML IJ SOLN: 12.5000 mg | Freq: Four times a day (QID) | INTRAMUSCULAR | Status: DC | PRN

## 2024-01-27 MED ORDER — PROCHLORPERAZINE EDISYLATE 10 MG/2ML IJ SOLN: 5.0000 mg | Freq: Four times a day (QID) | INTRAMUSCULAR | Status: DC | PRN

## 2024-01-27 MED ORDER — FENTANYL CITRATE (PF) 100 MCG/2ML IJ SOLN
INTRAMUSCULAR | Status: AC
  Filled 2024-01-27: qty 2

## 2024-01-27 MED ORDER — PROCHLORPERAZINE MALEATE 10 MG PO TABS: 10.0000 mg | ORAL_TABLET | Freq: Four times a day (QID) | ORAL | Status: DC | PRN

## 2024-01-27 MED ORDER — SIMETHICONE 80 MG PO CHEW
40.0000 mg | CHEWABLE_TABLET | Freq: Four times a day (QID) | ORAL | Status: DC | PRN
  Filled 2024-01-27: qty 1

## 2024-01-27 MED ORDER — LACTATED RINGERS IR SOLN
Status: DC | PRN
  Administered 2024-01-27: 1000 mL

## 2024-01-27 MED ORDER — KETOROLAC TROMETHAMINE 30 MG/ML IJ SOLN
INTRAMUSCULAR | Status: AC
  Filled 2024-01-27: qty 1

## 2024-01-27 MED ORDER — ROPINIROLE HCL ER 4 MG PO TB24
4.0000 mg | ORAL_TABLET | Freq: Every day | ORAL | Status: DC
  Administered 2024-01-27 – 2024-01-30 (×4): 4 mg via ORAL
  Filled 2024-01-27 (×4): qty 1

## 2024-01-27 MED ORDER — SUGAMMADEX SODIUM 200 MG/2ML IV SOLN
INTRAVENOUS | Status: AC
  Filled 2024-01-27: qty 2

## 2024-01-27 MED ORDER — OXYCODONE HCL 5 MG PO TABS
ORAL_TABLET | ORAL | Status: AC
  Filled 2024-01-27: qty 1

## 2024-01-27 MED ORDER — 0.9 % SODIUM CHLORIDE (POUR BTL) OPTIME
TOPICAL | Status: DC | PRN
  Administered 2024-01-27: 1000 mL

## 2024-01-27 MED ORDER — ACETAMINOPHEN 500 MG PO TABS: 1000.0000 mg | ORAL_TABLET | ORAL | Status: DC

## 2024-01-27 MED ORDER — STERILE WATER FOR IRRIGATION IR SOLN
Status: DC | PRN
  Administered 2024-01-27: 1000 mL

## 2024-01-27 MED ORDER — ESMOLOL HCL 100 MG/10ML IV SOLN
INTRAVENOUS | Status: DC | PRN
  Administered 2024-01-27 (×2): 10 mg via INTRAVENOUS

## 2024-01-27 MED ORDER — HYDRALAZINE HCL 20 MG/ML IJ SOLN: 10.0000 mg | INTRAMUSCULAR | Status: DC | PRN

## 2024-01-27 MED ORDER — BUPIVACAINE-EPINEPHRINE 0.25% -1:200000 IJ SOLN
INTRAMUSCULAR | Status: DC | PRN
  Administered 2024-01-27: 30 mL

## 2024-01-27 MED ORDER — CEFAZOLIN SODIUM-DEXTROSE 2-4 GM/100ML-% IV SOLN
2.0000 g | INTRAVENOUS | Status: AC
  Administered 2024-01-27: 2 g via INTRAVENOUS
  Filled 2024-01-27: qty 100

## 2024-01-27 MED ORDER — LIDOCAINE HCL (CARDIAC) PF 100 MG/5ML IV SOSY
PREFILLED_SYRINGE | INTRAVENOUS | Status: DC | PRN
  Administered 2024-01-27: 100 mg via INTRAVENOUS

## 2024-01-27 MED ORDER — ALBUMIN HUMAN 5 % IV SOLN: INTRAVENOUS | Status: DC | PRN

## 2024-01-27 MED ORDER — DIPHENHYDRAMINE HCL 12.5 MG/5ML PO ELIX: 12.5000 mg | ORAL_SOLUTION | Freq: Four times a day (QID) | ORAL | Status: DC | PRN

## 2024-01-27 MED ORDER — DEXAMETHASONE SODIUM PHOSPHATE 10 MG/ML IJ SOLN
INTRAMUSCULAR | Status: AC
  Filled 2024-01-27: qty 1

## 2024-01-27 MED ORDER — DEXMEDETOMIDINE HCL IN NACL 80 MCG/20ML IV SOLN
INTRAVENOUS | Status: DC | PRN
  Administered 2024-01-27: 4 ug via INTRAVENOUS
  Administered 2024-01-27 (×2): 8 ug via INTRAVENOUS

## 2024-01-27 MED ORDER — OXYCODONE HCL 5 MG PO TABS
5.0000 mg | ORAL_TABLET | Freq: Four times a day (QID) | ORAL | Status: DC | PRN
  Administered 2024-01-27 – 2024-01-30 (×10): 5 mg via ORAL
  Filled 2024-01-27 (×10): qty 1

## 2024-01-27 MED ORDER — GABAPENTIN 100 MG PO CAPS
100.0000 mg | ORAL_CAPSULE | Freq: Two times a day (BID) | ORAL | Status: DC
Start: 2024-01-27 — End: 2024-01-27

## 2024-01-27 MED ORDER — HYDROMORPHONE HCL 1 MG/ML IJ SOLN
INTRAMUSCULAR | Status: DC | PRN
Start: 2024-01-27 — End: 2024-01-27
  Administered 2024-01-27: .6 mg via INTRAVENOUS

## 2024-01-27 MED ORDER — CHLORHEXIDINE GLUCONATE 0.12 % MT SOLN
15.0000 mL | Freq: Once | OROMUCOSAL | Status: AC
  Administered 2024-01-27: 15 mL via OROMUCOSAL

## 2024-01-27 MED ORDER — ROCURONIUM BROMIDE 100 MG/10ML IV SOLN
INTRAVENOUS | Status: DC | PRN
  Administered 2024-01-27: 100 mg via INTRAVENOUS

## 2024-01-27 MED ORDER — ORAL CARE MOUTH RINSE: 15.0000 mL | Freq: Once | OROMUCOSAL | Status: AC

## 2024-01-27 MED ORDER — VENLAFAXINE HCL ER 37.5 MG PO CP24
75.0000 mg | ORAL_CAPSULE | Freq: Every day | ORAL | Status: DC
  Administered 2024-01-27 – 2024-01-31 (×5): 75 mg via ORAL
  Filled 2024-01-27 (×5): qty 2

## 2024-01-27 SURGICAL SUPPLY — 58 items
APPLIER CLIP 5 13 M/L LIGAMAX5 (MISCELLANEOUS) IMPLANT
APPLIER CLIP ROT 10 11.4 M/L (STAPLE) IMPLANT
BAG COUNTER SPONGE SURGICOUNT (BAG) IMPLANT
BENZOIN TINCTURE PRP APPL 2/3 (GAUZE/BANDAGES/DRESSINGS) IMPLANT
BLADE EXTENDED COATED 6.5IN (ELECTRODE) IMPLANT
BLADE SURG SZ10 CARB STEEL (BLADE) IMPLANT
CHLORAPREP W/TINT 26 (MISCELLANEOUS) ×1 IMPLANT
CLIP APPLIE 5 13 M/L LIGAMAX5 (MISCELLANEOUS) IMPLANT
CLIP APPLIE ROT 10 11.4 M/L (STAPLE) IMPLANT
COVER MAYO STAND STRL (DRAPES) IMPLANT
COVER SURGICAL LIGHT HANDLE (MISCELLANEOUS) ×1 IMPLANT
DERMABOND ADVANCED .7 DNX12 (GAUZE/BANDAGES/DRESSINGS) IMPLANT
DRAPE WARM FLUID 44X44 (DRAPES) IMPLANT
DRSG OPSITE POSTOP 4X8 (GAUZE/BANDAGES/DRESSINGS) IMPLANT
ELECT PENCIL ROCKER SW 15FT (MISCELLANEOUS) IMPLANT
ELECT REM PT RETURN 15FT ADLT (MISCELLANEOUS) ×1 IMPLANT
GAUZE SPONGE 4X4 12PLY STRL (GAUZE/BANDAGES/DRESSINGS) ×1 IMPLANT
GOWN STRL REUS W/ TWL LRG LVL3 (GOWN DISPOSABLE) ×1 IMPLANT
GOWN STRL REUS W/ TWL XL LVL3 (GOWN DISPOSABLE) ×1 IMPLANT
HANDLE SUCTION POOLE (INSTRUMENTS) IMPLANT
IRRIG SUCT STRYKERFLOW 2 WTIP (MISCELLANEOUS) IMPLANT
IRRIGATION SUCT STRKRFLW 2 WTP (MISCELLANEOUS) IMPLANT
KIT BASIN OR (CUSTOM PROCEDURE TRAY) ×1 IMPLANT
KIT TURNOVER KIT A (KITS) IMPLANT
RELOAD PROXIMATE 75MM BLUE (ENDOMECHANICALS) ×2 IMPLANT
RELOAD STAPLE 75 3.8 BLU REG (ENDOMECHANICALS) IMPLANT
RETRACTOR WND ALEXIS 18 MED (MISCELLANEOUS) IMPLANT
RETRACTOR WND ALEXIS 25 LRG (MISCELLANEOUS) IMPLANT
RTRCTR WOUND ALEXIS 18CM MED (MISCELLANEOUS) IMPLANT
RTRCTR WOUND ALEXIS 25CM LRG (MISCELLANEOUS) ×1 IMPLANT
SCISSORS LAP 5X35 DISP (ENDOMECHANICALS) ×1 IMPLANT
SHEARS HARMONIC 36 ACE (MISCELLANEOUS) IMPLANT
SLEEVE Z-THREAD 5X100MM (TROCAR) IMPLANT
SPIKE FLUID TRANSFER (MISCELLANEOUS) ×1 IMPLANT
STAPLER GUN LINEAR PROX 60 (STAPLE) IMPLANT
STAPLER PROXIMATE 75MM BLUE (STAPLE) IMPLANT
STRIP CLOSURE SKIN 1/2X4 (GAUZE/BANDAGES/DRESSINGS) IMPLANT
SUCTION POOLE HANDLE (INSTRUMENTS) IMPLANT
SUT MNCRL AB 4-0 PS2 18 (SUTURE) IMPLANT
SUT PDS AB 1 CT1 27 (SUTURE) IMPLANT
SUT PDS AB 1 TP1 96 (SUTURE) IMPLANT
SUT PROLENE 2 0 SH DA (SUTURE) IMPLANT
SUT SILK 2 0 SH CR/8 (SUTURE) IMPLANT
SUT SILK 2-0 18XBRD TIE 12 (SUTURE) IMPLANT
SUT SILK 3 0 SH CR/8 (SUTURE) IMPLANT
SUT SILK 3-0 18XBRD TIE 12 (SUTURE) IMPLANT
SYR BULB IRRIG 60ML STRL (SYRINGE) IMPLANT
SYS LAPSCP GELPORT 120MM (MISCELLANEOUS) IMPLANT
SYSTEM LAPSCP GELPORT 120MM (MISCELLANEOUS) IMPLANT
TOWEL OR 17X26 10 PK STRL BLUE (TOWEL DISPOSABLE) ×1 IMPLANT
TRAY FOLEY MTR SLVR 16FR STAT (SET/KITS/TRAYS/PACK) ×1 IMPLANT
TRAY LAPAROSCOPIC (CUSTOM PROCEDURE TRAY) ×1 IMPLANT
TROCAR 11X100 Z THREAD (TROCAR) IMPLANT
TROCAR XCEL NON-BLD 5MMX100MML (ENDOMECHANICALS) IMPLANT
TROCAR Z THREAD OPTICAL 12X100 (TROCAR) IMPLANT
TROCAR Z-THREAD OPTICAL 5X100M (TROCAR) ×1 IMPLANT
YANKAUER SUCT BULB TIP 10FT TU (MISCELLANEOUS) IMPLANT
YANKAUER SUCT BULB TIP NO VENT (SUCTIONS) IMPLANT

## 2024-01-27 NOTE — Anesthesia Postprocedure Evaluation (Signed)
 Anesthesia Post Note  Patient: Lee Lee  Procedure(s) Performed: LAPARASCOPIC ASSISTED SMALL BOWEL RESECTION     Patient location during evaluation: PACU Anesthesia Type: General Level of consciousness: awake and alert, oriented and patient cooperative Pain management: pain level controlled Vital Signs Assessment: post-procedure vital signs reviewed and stable Respiratory status: spontaneous breathing, nonlabored ventilation and respiratory function stable Cardiovascular status: blood pressure returned to baseline and stable Postop Assessment: no apparent nausea or vomiting Anesthetic complications: no   No notable events documented.  Last Vitals:  Vitals:   01/27/24 1130 01/27/24 1146  BP: 123/66   Pulse: 81   Resp: 20   Temp:    SpO2: 92% 91%    Last Pain:  Vitals:   01/27/24 1135  TempSrc:   PainSc: 6                  Lannie Fields

## 2024-01-27 NOTE — Transfer of Care (Signed)
 Immediate Anesthesia Transfer of Care Note  Patient: Lee Lee  Procedure(s) Performed: LAPARASCOPIC ASSISTED SMALL BOWEL RESECTION  Patient Location: PACU  Anesthesia Type:General  Level of Consciousness: awake, alert , and oriented  Airway & Oxygen Therapy: Patient Spontanous Breathing and Patient connected to face mask oxygen  Post-op Assessment: Report given to RN and Post -op Vital signs reviewed and stable  Post vital signs: Reviewed and stable  Last Vitals:  Vitals Value Taken Time  BP 146/74 01/27/24 1038  Temp    Pulse 83 01/27/24 1040  Resp 19 01/27/24 1040  SpO2 100 % 01/27/24 1040  Vitals shown include unfiled device data.  Last Pain:  Vitals:   01/27/24 0700  TempSrc:   PainSc: 0-No pain         Complications: No notable events documented.

## 2024-01-27 NOTE — Op Note (Addendum)
 Operative Note  DIJON COSENS  [id number]  [id number]  01/27/2024   Surgeon: Phylliss Blakes MD FACS   Assistant: Darnell Level MD FACS   Procedures performed:  Diagnostic laparoscopy Laparoscopic assisted small bowel resection Biopsy of mesenteric mass   Preop diagnosis: Multiple small bowel nodules and mesenteric mass.  Preop CA 19-9 was 13, preop CEA was 2.3 Post-op diagnosis/intraop findings: same   Specimens:  Small bowel resection, stitch marks distal Biopsy of large mesenteric mass  Retained items: no  EBL: 30cc Complications: none   Description of procedure: After confirming informed consent the patient was taken to the operating room and placed supine on operating room table where general endotracheal anesthesia was initiated, preoperative antibiotics were administered, SCDs applied, and a formal timeout was performed.  The abdomen was prepped and draped in usual sterile fashion.  Peritoneal access was gained with a left subcostal optical entry followed by uneventful insufflation to 15 mmHg.  On gross inspection there is no injury from entry.  No obvious peritoneal or omental studding and no overt masses along the visible surface of the liver.  2 additional 5 mm trocars were placed in the left hemiabdomen under direct visualization and after infiltration with local.  The patient was then placed in Trendelenburg and rotated to the left and the small bowel was examined beginning at the ileocecal valve.  The small bowel was followed proximally and we identified at least 5 small bowel nodules which appeared to be tethering the serosa.  This occupied an approximately 50 cm span of small bowel in the proximal ileum.  We followed the bowel proximally all the way to the ligament of Treitz and there were no other overt lesions identified.  The larger mesenteric mass was not immediately visible. At this point a small vertical midline incision was made and the Alexis wound protector was  inserted.  The small bowel segment containing the masses was brought out through the wound and reexamined, confirming several tethering lesions along the small bowel with palpable nodules along the mesentery.  An approximately 60 cm segment of small bowel was excised to include all of the grossly palpable/visible lesions.  The bowel was divided proximally and distally with a 75 mm Endo GIA stapler and the intervening mesentery divided with the LigaSure.  Bleeding points along the mesentery were oversewn with figure-of-eight 3-0 silk sutures.  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  This will undergo further pathological examination.  The deeper mesenteric mass which was noted on his CT scan was actually quite deep within the mesentery and adjacent to the larger mesenteric vessels.  This actually felt somewhat soft but was definitely tethering the mesentery toward itself.  I did take 2 superficial biopsies of this which were sent as additional specimens but did not get into the deeper portions of the mass due to concern for vascular injury and threatening the vascular supply to the remaining small bowel.  Hemostasis was ensured with cautery and Surgicel snow.  We then created our small bowel anastomosis with a 75 mm Endo GIA stapler.  This common enterotomy was closed with a TX 60 blue load.  The corners of this latter staple line were imbricated with seromuscular 3-0 silks and bleeding points along the staple line were addressed with figure-of-eight 3-0 silks.  An additional seromuscular 3-0 silk was placed at the apex of the staple line.  On completion  the anastomosis appears well-perfused, widely patent and viable.  The mesenteric defect was closed with interrupted figure-of-eight 3-0 silk sutures.  The bowel was then reduced into the abdomen.  The abdomen was irrigated with warm sterile saline and the  effluent was clear.  Hemostasis was ensured.  The omentum was brought down over the small bowel and the wound protector removed.  The fascia was closed with running #1 PDS starting at either end and tying centrally.  Hemostasis was ensured and the soft tissues and additional local was infiltrated around the incision.  Skin incisions were all closed with subcuticular 4-0 Monocryl.  Steri-Strips and sterile dressings were applied.  The patient was then awakened, extubated and taken to PACU in stable condition.    All counts were correct at the completion of the case.

## 2024-01-27 NOTE — Interval H&P Note (Signed)
 History and Physical Interval Note:  01/27/2024 7:07 AM  Lee Lee  has presented today for surgery, with the diagnosis of SMALL BOWEL, MESENTERIC MASS.  The various methods of treatment have been discussed with the patient and family. After consideration of risks, benefits and other options for treatment, the patient has consented to  Procedure(s) with comments: LAPARASCOPIC ASSISTED SMALL BOWEL RESECTION (N/A) - 90 MINUTES as a surgical intervention.  The patient's history has been reviewed, patient examined, no change in status, stable for surgery.  I have reviewed the patient's chart and labs.  Questions were answered to the patient's satisfaction.     Rosene Pilling Lollie Sails

## 2024-01-27 NOTE — Discharge Instructions (Signed)
 ABDOMINAL SURGERY: POST OP INSTRUCTIONS  DIET: Follow a light bland diet the first 24 hours after arrival home, such as soup, liquids, crackers, etc.  Be sure to include lots of fluids daily.  Avoid fast food or heavy meals as you are more likely to get nauseated.  Do not eat any uncooked fruits or vegetables for the next 2 weeks as your bowel heals. Take your usually prescribed home medications unless otherwise directed. PAIN CONTROL: Pain is best controlled by a usual combination of three different methods TOGETHER: Ice/Heat Over the counter pain medication Prescription pain medication Most patients will experience some swelling and bruising around the incisions.  Ice packs or heating pads (30-60 minutes up to 6 times a day) will help. Use ice for the first few days to help decrease swelling and bruising, then switch to heat to help relax tight/sore spots and speed recovery.  Some people prefer to use ice alone, heat alone, alternating between ice & heat.  Experiment to what works for you.  Swelling and bruising can take several weeks to resolve.   It is helpful to take an over-the-counter pain medication regularly for the first few days: Naproxen (Aleve, etc)  Two 220mg  tabs twice a day OR Ibuprofen (Advil, etc) Three 200mg  tabs four times a day (every meal & bedtime) AND Acetaminophen (Tylenol, etc) 500-650mg  four times a day (every meal & bedtime) A  prescription for pain medication (such as oxycodone, hydrocodone, etc) should be given to you upon discharge.  Take your pain medication as prescribed.  If you are having problems/concerns with the prescription medicine (does not control pain, nausea, vomiting, rash, itching, etc), please call us [phone] to see if we need to switch you to a different pain medicine that will work better for you and/or control your side effect better. If you need a refill on your pain medication, please contact your pharmacy.  They will contact our office to  request authorization. Prescriptions will not be filled after 5 pm or on week-ends. Avoid getting constipated.  Between the surgery and the pain medications, it is common to experience some constipation.  Increasing fluid intake and taking a fiber supplement (such as Metamucil, Citrucel, FiberCon, MiraLax, etc) 1-2 times a day regularly will usually help prevent this problem from occurring.  A mild laxative (prune juice, Milk of Magnesia, MiraLax, etc) should be taken according to package directions if there are no bowel movements after 48 hours.   Watch out for diarrhea.  If you have many loose bowel movements, simplify your diet to bland foods & liquids for a few days.  Stop any stool softeners and decrease your fiber supplement.  Switching to mild anti-diarrheal medications (Kayopectate, Pepto Bismol) can help.  If this worsens or does not improve, please call us. Wash / shower every day.  You may shower over the incision / wound.  Avoid baths until the skin is fully healed.  Continue to shower over incision(s) after the dressing is off. Remove your waterproof bandage 5 days after surgery. The white strips will peel off on their own after 1-2 weeks. You may leave the incision open to air.  You may replace a dressing/Band-Aid to cover the incision for comfort if you wish. ACTIVITIES as tolerated:   You may resume regular (light) daily activities beginning the next day--such as daily self-care, walking, climbing stairs--gradually increasing activities as tolerated.  If you can walk 30 minutes without difficulty, it is safe to try more intense activity such  as jogging, treadmill, bicycling, low-impact aerobics, swimming, etc. Save the most intensive and strenuous activity for last such as sit-ups, heavy lifting, contact sports, etc  Refrain from any heavy lifting or straining until about 6 weeks after surgery.   DO NOT PUSH THROUGH PAIN.  Let pain be your guide: If it hurts to do something, don't do it.   Pain is your body warning you to avoid that activity for another week until the pain goes down. You may drive when you are no longer taking prescription pain medication, you can comfortably wear a seatbelt, and you can safely maneuver your car and apply brakes. You may have sexual intercourse when it is comfortable.  FOLLOW UP in our office Please call [facility] at [phone] to set up an appointment to see your surgeon in the office for a follow-up appointment approximately 1-2 weeks after your surgery. Make sure that you call for this appointment the day you arrive home to insure a convenient appointment time. 10. IF YOU HAVE DISABILITY OR FAMILY LEAVE FORMS, BRING THEM TO THE OFFICE FOR PROCESSING.  DO NOT GIVE THEM TO YOUR DOCTOR.   WHEN TO CALL us [phone]: Poor pain control Reactions / problems with new medications (rash/itching, nausea, etc)  Fever over 101.5 F (38.5 C) Inability to urinate Nausea and/or vomiting Worsening swelling or bruising Continued bleeding from incision. Increased pain, redness, or drainage from the incision  The clinic staff is available to answer your questions during regular business hours (8:30am-5pm).  Please don't hesitate to call and ask to speak to one of our nurses for clinical concerns.   A surgeon from [facility] is always on call at the hospitals   If you have a medical emergency, go to the nearest emergency room or call 911.    [facility] [address], [place], [place]  [address] ? MAIN: [phone] ? TOLL FREE: [phone] ? FAX [phone] [email or web address]

## 2024-01-27 NOTE — Anesthesia Procedure Notes (Signed)
 Procedure Name: Intubation Date/Time: 01/27/2024 8:38 AM  Performed by: Floydene Flock, CRNAPre-anesthesia Checklist: Patient identified, Emergency Drugs available, Suction available and Patient being monitored Patient Re-evaluated:Patient Re-evaluated prior to induction Oxygen Delivery Method: Circle system utilized Preoxygenation: Pre-oxygenation with 100% oxygen Induction Type: IV induction Ventilation: Oral airway inserted - appropriate to patient size and Mask ventilation without difficulty Laryngoscope Size: Mac and 3 Grade View: Grade I Tube type: Oral Tube size: 7.5 mm Number of attempts: 1 Airway Equipment and Method: Stylet and Video-laryngoscopy Secured at: 22 cm Tube secured with: Tape Dental Injury: Teeth and Oropharynx as per pre-operative assessment

## 2024-01-28 ENCOUNTER — Encounter (HOSPITAL_COMMUNITY): Payer: Self-pay | Admitting: Surgery

## 2024-01-28 LAB — MAGNESIUM: Magnesium: 2.1 mg/dL (ref 1.7–2.4)

## 2024-01-28 LAB — CBC
HCT: 40.8 % (ref 39.0–52.0)
Hemoglobin: 13.3 g/dL (ref 13.0–17.0)
MCH: 36 pg — ABNORMAL HIGH (ref 26.0–34.0)
MCHC: 32.6 g/dL (ref 30.0–36.0)
MCV: 110.6 fL — ABNORMAL HIGH (ref 80.0–100.0)
Platelets: 132 10*3/uL — ABNORMAL LOW (ref 150–400)
RBC: 3.69 MIL/uL — ABNORMAL LOW (ref 4.22–5.81)
RDW: 12.3 % (ref 11.5–15.5)
WBC: 11.7 10*3/uL — ABNORMAL HIGH (ref 4.0–10.5)
nRBC: 0 % (ref 0.0–0.2)

## 2024-01-28 LAB — BASIC METABOLIC PANEL
Anion gap: 7 (ref 5–15)
BUN: 14 mg/dL (ref 8–23)
CO2: 28 mmol/L (ref 22–32)
Calcium: 8.5 mg/dL — ABNORMAL LOW (ref 8.9–10.3)
Chloride: 103 mmol/L (ref 98–111)
Creatinine, Ser: 0.65 mg/dL (ref 0.61–1.24)
GFR, Estimated: >60 mL/min (ref >60)
Glucose, Bld: 142 mg/dL — ABNORMAL HIGH (ref 70–99)
Potassium: 4.1 mmol/L (ref 3.5–5.1)
Sodium: 138 mmol/L (ref 135–145)

## 2024-01-28 LAB — GLUCOSE, CAPILLARY
Glucose-Capillary: 97 mg/dL (ref 70–99)
Glucose-Capillary: 99 mg/dL (ref 70–99)
Glucose-Capillary: 87 mg/dL (ref 70–99)

## 2024-01-28 NOTE — Progress Notes (Signed)
 1 Day Post-Op  Subjective: No issues overnight, pain controlled, no nausea, tolerating fulls, no flatus  Objective: Vital signs in last 24 hours: Temp:  [97.4 F (36.3 C)-98.6 F (37 C)] 97.4 F (36.3 C) (03/01 0634) Pulse Rate:  [62-81] 62 (03/01 0634) Resp:  [10-22] 17 (03/01 0634) BP: (106-166)/(63-102) 137/78 (03/01 0634) SpO2:  [89 %-100 %] 98 % (03/01 0634) Weight:  [104.8 kg] 104.8 kg (03/01 0515)   Intake/Output from previous day: 02/28 0701 - 03/01 0700 In: 2209.1 [P.O.:840; I.V.:69.1; IV Piggyback:1300] Out: 270 [Urine:250; Blood:20] Intake/Output this shift: No intake/output data recorded.   General appearance: alert and cooperative GI: soft, mildly distended  Incision: no significant drainage  Lab Results:  Recent Labs    01/28/24 0525  WBC 11.7*  HGB 13.3  HCT 40.8  PLT 132*   BMET Recent Labs    01/28/24 0525  NA 138  K 4.1  CL 103  CO2 28  GLUCOSE 142*  BUN 14  CREATININE 0.65  CALCIUM 8.5*   PT/INR No results for input(s): "LABPROT", "INR" in the last 72 hours. ABG No results for input(s): "PHART", "HCO3" in the last 72 hours.  Invalid input(s): "PCO2", "PO2"  MEDS, Scheduled  acetaminophen  1,000 mg Oral Q6H   busPIRone  5 mg Oral TID   docusate sodium  100 mg Oral BID   gabapentin  300 mg Oral TID   heparin  5,000 Units Subcutaneous Q8H   insulin aspart  0-15 Units Subcutaneous TID WC   insulin aspart  0-5 Units Subcutaneous QHS   losartan  25 mg Oral QPM   rOPINIRole  4 mg Oral QHS   venlafaxine XR  75 mg Oral Q breakfast    Studies/Results: No results found.  Assessment: s/p Procedure(s): LAPARASCOPIC ASSISTED SMALL BOWEL RESECTION Patient Active Problem List   Diagnosis Date Noted   S/P small bowel resection 01/27/2024   PSA elevation 05/10/2023   Thrombocytopenia (HCC) 12/27/2022   Type II diabetes mellitus with manifestations (HCC) 09/22/2022   Bilateral carpal tunnel syndrome 09/06/2022   Alcoholic peripheral  neuropathy (HCC) 09/06/2022   PAD (peripheral artery disease) (HCC) 06/10/2022   Alcohol abuse 10/01/2021   Thiamine deficiency 06/01/2021   Encounter for general adult medical examination with abnormal findings 12/31/2020   Steatohepatitis 06/18/2020   Severe episode of recurrent major depressive disorder, without psychotic features (HCC) 06/18/2020   Abnormal electrocardiogram (ECG) (EKG) 06/18/2020   Vitamin B12 deficiency neuropathy (HCC) 12/24/2019   Thoracic aortic aneurysm without rupture (HCC) 12/27/2018   Seasonal allergic rhinitis due to pollen 12/27/2018   Benign prostatic hyperplasia without lower urinary tract symptoms 12/26/2018   Unilateral primary osteoarthritis, left knee 03/08/2018   Insomnia due to anxiety and fear 09/30/2016   Restless leg syndrome, familial 09/30/2016   Iron deficiency anemia 09/30/2016   Hypertriglyceridemia 06/15/2016   Hyperlipidemia with target LDL less than 130 06/15/2016   S/P radiofrequency ablation operation for arrhythmia 07/04/15 07/05/2015   SVT (supraventricular tachycardia) (HCC) 07/04/2015   Melanoma of skin (HCC) 04/16/2015   Atrial flutter (HCC) 12/07/2012   Essential hypertension 11/07/2012   Ascending aortic aneurysm (HCC) 11/07/2012   ED (erectile dysfunction) 11/15/2011    Expected post op course  Plan: Cont fulls Can advance to reg diet if bowel function returns Ambulate in hall SL IVF's    LOS: 1 day     .Vanita Panda, MD [facility], [place]    01/28/2024 9:09 AM

## 2024-01-29 LAB — CBC
HCT: 31.6 % — ABNORMAL LOW (ref 39.0–52.0)
Hemoglobin: 10 g/dL — ABNORMAL LOW (ref 13.0–17.0)
MCH: 35.8 pg — ABNORMAL HIGH (ref 26.0–34.0)
MCHC: 31.6 g/dL (ref 30.0–36.0)
MCV: 113.3 fL — ABNORMAL HIGH (ref 80.0–100.0)
Platelets: 102 10*3/uL — ABNORMAL LOW (ref 150–400)
RBC: 2.79 MIL/uL — ABNORMAL LOW (ref 4.22–5.81)
RDW: 12.8 % (ref 11.5–15.5)
WBC: 6.3 10*3/uL (ref 4.0–10.5)
nRBC: 0 % (ref 0.0–0.2)

## 2024-01-29 LAB — BASIC METABOLIC PANEL
Anion gap: 4 — ABNORMAL LOW (ref 5–15)
BUN: 21 mg/dL (ref 8–23)
CO2: 28 mmol/L (ref 22–32)
Calcium: 8.1 mg/dL — ABNORMAL LOW (ref 8.9–10.3)
Chloride: 105 mmol/L (ref 98–111)
Creatinine, Ser: 0.76 mg/dL (ref 0.61–1.24)
GFR, Estimated: >60 mL/min (ref >60)
Glucose, Bld: 116 mg/dL — ABNORMAL HIGH (ref 70–99)
Potassium: 4 mmol/L (ref 3.5–5.1)
Sodium: 137 mmol/L (ref 135–145)

## 2024-01-29 LAB — GLUCOSE, CAPILLARY
Glucose-Capillary: 101 mg/dL — ABNORMAL HIGH (ref 70–99)
Glucose-Capillary: 133 mg/dL — ABNORMAL HIGH (ref 70–99)
Glucose-Capillary: 131 mg/dL — ABNORMAL HIGH (ref 70–99)
Glucose-Capillary: 93 mg/dL (ref 70–99)

## 2024-01-29 MED ORDER — CALCIUM POLYCARBOPHIL 625 MG PO TABS
625.0000 mg | ORAL_TABLET | Freq: Two times a day (BID) | ORAL | Status: DC
  Administered 2024-01-29 – 2024-01-31 (×4): 625 mg via ORAL
  Filled 2024-01-29 (×4): qty 1

## 2024-01-29 MED ORDER — SODIUM CHLORIDE 0.9% FLUSH
3.0000 mL | Freq: Two times a day (BID) | INTRAVENOUS | Status: DC
  Administered 2024-01-29 – 2024-01-31 (×5): 3 mL via INTRAVENOUS

## 2024-01-29 MED ORDER — SALINE SPRAY 0.65 % NA SOLN
1.0000 | Freq: Four times a day (QID) | NASAL | Status: DC | PRN
Start: 2024-01-29 — End: 2024-01-31

## 2024-01-29 MED ORDER — ALUM & MAG HYDROXIDE-SIMETH 200-200-20 MG/5ML PO SUSP: 30.0000 mL | Freq: Four times a day (QID) | ORAL | Status: DC | PRN

## 2024-01-29 MED ORDER — METHOCARBAMOL 1000 MG/10ML IJ SOLN: 1000.0000 mg | Freq: Four times a day (QID) | INTRAMUSCULAR | Status: DC | PRN

## 2024-01-29 MED ORDER — SODIUM CHLORIDE 0.9% FLUSH: 3.0000 mL | INTRAVENOUS | Status: DC | PRN

## 2024-01-29 MED ORDER — SIMETHICONE 40 MG/0.6ML PO SUSP: 80.0000 mg | Freq: Four times a day (QID) | ORAL | Status: DC | PRN

## 2024-01-29 MED ORDER — NAPHAZOLINE-GLYCERIN 0.012-0.25 % OP SOLN: 1.0000 [drp] | Freq: Four times a day (QID) | OPHTHALMIC | Status: DC | PRN

## 2024-01-29 MED ORDER — MAGIC MOUTHWASH: 15.0000 mL | Freq: Four times a day (QID) | ORAL | Status: DC | PRN

## 2024-01-29 MED ORDER — SODIUM CHLORIDE 0.9 % IV SOLN: 250.0000 mL | INTRAVENOUS | Status: DC | PRN

## 2024-01-29 MED ORDER — LACTATED RINGERS IV BOLUS: 1000.0000 mL | Freq: Three times a day (TID) | INTRAVENOUS | Status: DC | PRN

## 2024-01-29 MED ORDER — METHOCARBAMOL 500 MG PO TABS
1000.0000 mg | ORAL_TABLET | Freq: Four times a day (QID) | ORAL | Status: DC | PRN
  Administered 2024-01-29: 1000 mg via ORAL
  Filled 2024-01-29: qty 2

## 2024-01-29 MED ORDER — PHENOL 1.4 % MT LIQD: 2.0000 | OROMUCOSAL | Status: DC | PRN

## 2024-01-29 MED ORDER — MENTHOL 3 MG MT LOZG: 1.0000 | LOZENGE | OROMUCOSAL | Status: DC | PRN

## 2024-01-29 NOTE — Plan of Care (Signed)

## 2024-01-29 NOTE — Progress Notes (Signed)
 2 Days Post-Op  Subjective: Had a couple bloody BMs.  Tolerating a diet  Objective: Vital signs in last 24 hours: Temp:  [97.5 F (36.4 C)-98.6 F (37 C)] 97.5 F (36.4 C) (03/02 0628) Pulse Rate:  [65-71] 66 (03/02 0628) Resp:  [17-18] 17 (03/02 0628) BP: (126-164)/(76-90) 126/76 (03/02 0628) SpO2:  [98 %-99 %] 98 % (03/02 0628)   Intake/Output from previous day: 03/01 0701 - 03/02 0700 In: 2760 [P.O.:2760] Out: -  Intake/Output this shift: No intake/output data recorded.   General appearance: alert and cooperative GI: soft, mildly distended  Incision: some bloody drainage noted  Lab Results:  Recent Labs    01/28/24 0525 01/29/24 0455  WBC 11.7* 6.3  HGB 13.3 10.0*  HCT 40.8 31.6*  PLT 132* 102*   BMET Recent Labs    01/28/24 0525 01/29/24 0455  NA 138 137  K 4.1 4.0  CL 103 105  CO2 28 28  GLUCOSE 142* 116*  BUN 14 21  CREATININE 0.65 0.76  CALCIUM 8.5* 8.1*   PT/INR No results for input(s): "LABPROT", "INR" in the last 72 hours. ABG No results for input(s): "PHART", "HCO3" in the last 72 hours.  Invalid input(s): "PCO2", "PO2"  MEDS, Scheduled  acetaminophen  1,000 mg Oral Q6H   busPIRone  5 mg Oral TID   docusate sodium  100 mg Oral BID   gabapentin  300 mg Oral TID   heparin  5,000 Units Subcutaneous Q8H   insulin aspart  0-15 Units Subcutaneous TID WC   insulin aspart  0-5 Units Subcutaneous QHS   losartan  25 mg Oral QPM   rOPINIRole  4 mg Oral QHS   venlafaxine XR  75 mg Oral Q breakfast    Studies/Results: No results found.  Assessment: s/p Procedure(s): LAPARASCOPIC ASSISTED SMALL BOWEL RESECTION Patient Active Problem List   Diagnosis Date Noted   S/P small bowel resection 01/27/2024   PSA elevation 05/10/2023   Thrombocytopenia (HCC) 12/27/2022   Type II diabetes mellitus with manifestations (HCC) 09/22/2022   Bilateral carpal tunnel syndrome 09/06/2022   Alcoholic peripheral neuropathy (HCC) 09/06/2022   PAD  (peripheral artery disease) (HCC) 06/10/2022   Alcohol abuse 10/01/2021   Thiamine deficiency 06/01/2021   Encounter for general adult medical examination with abnormal findings 12/31/2020   Steatohepatitis 06/18/2020   Severe episode of recurrent major depressive disorder, without psychotic features (HCC) 06/18/2020   Abnormal electrocardiogram (ECG) (EKG) 06/18/2020   Vitamin B12 deficiency neuropathy (HCC) 12/24/2019   Thoracic aortic aneurysm without rupture (HCC) 12/27/2018   Seasonal allergic rhinitis due to pollen 12/27/2018   Benign prostatic hyperplasia without lower urinary tract symptoms 12/26/2018   Unilateral primary osteoarthritis, left knee 03/08/2018   Insomnia due to anxiety and fear 09/30/2016   Restless leg syndrome, familial 09/30/2016   Iron deficiency anemia 09/30/2016   Hypertriglyceridemia 06/15/2016   Hyperlipidemia with target LDL less than 130 06/15/2016   S/P radiofrequency ablation operation for arrhythmia 07/04/15 07/05/2015   SVT (supraventricular tachycardia) (HCC) 07/04/2015   Melanoma of skin (HCC) 04/16/2015   Atrial flutter (HCC) 12/07/2012   Essential hypertension 11/07/2012   Ascending aortic aneurysm (HCC) 11/07/2012   ED (erectile dysfunction) 11/15/2011    Expected post op course  Plan: Acute blood loss anemia: recheck in AM Cont reg diet as tolerated Ambulate in hall SL IVF's    LOS: 2 days     .Vanita Panda, MD [facility], [place]    01/29/2024 8:28 AM

## 2024-01-30 LAB — GLUCOSE, CAPILLARY
Glucose-Capillary: 113 mg/dL — ABNORMAL HIGH (ref 70–99)
Glucose-Capillary: 110 mg/dL — ABNORMAL HIGH (ref 70–99)
Glucose-Capillary: 96 mg/dL (ref 70–99)
Glucose-Capillary: 99 mg/dL (ref 70–99)
Glucose-Capillary: 96 mg/dL (ref 70–99)

## 2024-01-30 LAB — CBC
HCT: 27.7 % — ABNORMAL LOW (ref 39.0–52.0)
Hemoglobin: 9 g/dL — ABNORMAL LOW (ref 13.0–17.0)
MCH: 35.9 pg — ABNORMAL HIGH (ref 26.0–34.0)
MCHC: 32.5 g/dL (ref 30.0–36.0)
MCV: 110.4 fL — ABNORMAL HIGH (ref 80.0–100.0)
Platelets: 109 10*3/uL — ABNORMAL LOW (ref 150–400)
RBC: 2.51 MIL/uL — ABNORMAL LOW (ref 4.22–5.81)
RDW: 12.5 % (ref 11.5–15.5)
WBC: 5.3 10*3/uL (ref 4.0–10.5)
nRBC: 0 % (ref 0.0–0.2)

## 2024-01-30 MED ORDER — DOCUSATE SODIUM 100 MG PO CAPS
100.0000 mg | ORAL_CAPSULE | Freq: Two times a day (BID) | ORAL | Status: DC
  Administered 2024-01-30 – 2024-01-31 (×2): 100 mg via ORAL
  Filled 2024-01-30 (×2): qty 1

## 2024-01-30 NOTE — Progress Notes (Signed)
 S: Uneventful night; no nausea or bloating and pain is well controlled. Continues to have maroon-colored bowel movements as recently as this morning. No lightheadedness/dizziness.   O: Vitals, labs, intake/output, and orders reviewed at this time. Afebrile, no tachycardia; BP 97/64 this AM (lower than last 24h baseline), sat 99% RA, RR 16  Prn meds last 24h: robaxin x 1, oxycodone x 3, tramadol x 2  Gen: A&Ox3, no distress  H&N: EOMI, atraumatic, neck supple Chest: unlabored respirations, RRR Abd: soft, appropriately tender, nondistended, incision(s) c/d/i with steris, no cellulitis or hematoma. Old dried blood at inferior aspect of midline incision.  Ext: warm, no edema Neuro: grossly normal  Lines/tubes/drains: PIV  A/P: POD 3 s/p lap-assisted small bowel resection and biopsy of mesenteric mass.   Acute blood loss anemia secondary to likely anastomotic bleeding: hgb still trending down but slowing (preop 15.9-->13.3-->10.0-->9.0). Still with hematochezia. Monitor today. BP on low side but voiding well and no tachycardia. Monitor today, may need to hold losartan this PM  Otherwise he is doing well. Discussed intra-op findings. Awaiting pathology.     Phylliss Blakes, MD [facility], [place]

## 2024-01-30 NOTE — Progress Notes (Signed)
   01/30/24 1429  TOC Brief Assessment  Insurance and Status Reviewed  Patient has primary care physician Yes  Home environment has been reviewed Resides in private residence with spouse  Prior level of function: Independent  Prior/Current Home Services No current home services  Social Drivers of Health Review SDOH reviewed no interventions necessary  Readmission risk has been reviewed Yes  Transition of care needs no transition of care needs at this time

## 2024-01-31 LAB — CBC
HCT: 26.5 % — ABNORMAL LOW (ref 39.0–52.0)
Hemoglobin: 8.8 g/dL — ABNORMAL LOW (ref 13.0–17.0)
MCH: 36.4 pg — ABNORMAL HIGH (ref 26.0–34.0)
MCHC: 33.2 g/dL (ref 30.0–36.0)
MCV: 109.5 fL — ABNORMAL HIGH (ref 80.0–100.0)
Platelets: 104 10*3/uL — ABNORMAL LOW (ref 150–400)
RBC: 2.42 MIL/uL — ABNORMAL LOW (ref 4.22–5.81)
RDW: 12.6 % (ref 11.5–15.5)
WBC: 5.3 10*3/uL (ref 4.0–10.5)
nRBC: 0 % (ref 0.0–0.2)

## 2024-01-31 LAB — GLUCOSE, CAPILLARY: Glucose-Capillary: 103 mg/dL — ABNORMAL HIGH (ref 70–99)

## 2024-01-31 NOTE — Discharge Summary (Signed)
 Physician Discharge Summary  Patient ID: Lee Lee MRN: [id number] DOB/AGE: 71-May-1952 71 y.o.  Admit date: 01/27/2024 Discharge date: 01/31/2024  Admission Diagnoses: small bowel masses, mesenteric mass  Discharge Diagnoses:  Principal Problem:   S/P small bowel resection   Discharged Condition: good  Hospital Course: He was admitted for routine postoperative care following laparoscopic assisted small bowel resection and biopsy of mesenteric mass.  He progressed well.  He did have some hematochezia and a drop in hemoglobin on postop day 2 and was monitored for an additional 2 days following this.  His hemoglobin stabilized.  On the day of discharge.  He is tolerating a diet, pain is well-controlled, he is having bowel movements and ambulating without assistance.  Discharge Exam: Blood pressure 122/78, pulse 73, temperature 97.8 F (36.6 C), temperature source Oral, resp. rate 16, height 5\' 11"  (1.803 m), weight 104.8 kg, SpO2 99%. General appearance: alert and comfortable appearing Resp: unlabored respirations Cardio: regular rate and rhythm GI: soft, minimally appropriately tender, nondistended. Skin: Skin color, texture, turgor normal. No rashes or lesions Neurologic: Grossly normal Incision/Wound: c/d/I with steri strips, no cellulitis or hematoma  Disposition: Discharge disposition: 01-Home or Self Care      Allergies as of 01/31/2024   No Known Allergies      Medication List     TAKE these medications    acetaminophen 500 MG tablet Commonly known as: TYLENOL Take 1,500 mg by mouth every 8 (eight) hours as needed for moderate pain (pain score 4-6).   b complex vitamins capsule Take 1 capsule by mouth daily.   busPIRone 5 MG tablet Commonly known as: BUSPAR TAKE ONE TABLET BY MOUTH THREE TIMES A DAY   desvenlafaxine 50 MG 24 hr tablet Commonly known as: PRISTIQ TAKE 1 TABLET BY MOUTH DAILY   docusate sodium 100 MG capsule Commonly known as:  Colace Take 1 capsule (100 mg total) by mouth 2 (two) times daily. Okay to decrease to once daily or stop taking if having loose bowel movements   gabapentin 300 MG capsule Commonly known as: NEURONTIN Take 300 mg by mouth 3 (three) times daily.   gabapentin 100 MG capsule Commonly known as: NEURONTIN Take 1 capsule (100 mg total) by mouth 2 (two) times daily.   losartan 25 MG tablet Commonly known as: COZAAR Take 1 tablet (25 mg total) by mouth every evening.   oxyCODONE 5 MG immediate release tablet Commonly known as: Roxicodone Take 1 tablet (5 mg total) by mouth every 8 (eight) hours as needed for up to 5 days. Alternate tylenol and ibuprofen for the first few days. Take narcotic pain medication only if needed for severe/ breakthrough pain.   rOPINIRole 4 MG 24 hr tablet Commonly known as: REQUIP XL TAKE 1 TABLET (4MG  TOTAL) BY MOUTH AT BEDTIME. PER MD RETURN IN ABOUT 6 MONTHS (AROUND 11/09/2023).   tirzepatide 7.5 MG/0.5ML Pen Commonly known as: MOUNJARO Inject 7.5 mg into the skin once a week.   traZODone 150 MG tablet Commonly known as: DESYREL Take 1 tablet (150 mg total) by mouth at bedtime as needed. for sleep        Follow-up Information     Berna Bue, MD Follow up in 3 week(s).   Specialty: General Surgery Contact information: [address] [place] [address] [phone]                 Signed: Berna Bue 01/31/2024, 9:33 AM

## 2024-01-31 NOTE — Progress Notes (Signed)
 Discharge instructions discussed with patient, verbalized agreement and understanding

## 2024-01-31 NOTE — Plan of Care (Signed)
   Problem: Clinical Measurements: Goal: Diagnostic test results will improve Outcome: Progressing

## 2024-02-01 ENCOUNTER — Telehealth: Payer: Self-pay | Admitting: *Deleted

## 2024-02-01 DIAGNOSIS — G5603 Carpal tunnel syndrome, bilateral upper limbs: Secondary | ICD-10-CM | POA: Diagnosis not present

## 2024-02-01 DIAGNOSIS — G629 Polyneuropathy, unspecified: Secondary | ICD-10-CM | POA: Diagnosis not present

## 2024-02-01 LAB — SURGICAL PATHOLOGY

## 2024-02-01 NOTE — Transitions of Care (Post Inpatient/ED Visit) (Signed)
   02/01/2024  Name: Lee Lee MRN: [id number] DOB: 1951-02-11  Today's TOC FU Call Status: Today's TOC FU Call Status:: Unsuccessful Call (1st Attempt) Unsuccessful Call (1st Attempt) Date: 02/01/24  Attempted to reach the patient regarding the most recent Inpatient visit; left HIPAA compliant voice message requesting call back  Follow Up Plan: Additional outreach attempts will be made to reach the patient to complete the Transitions of Care (Post Inpatient visit) call.   Pls call/ message for questions,  Caryl Pina, RN, BSN, CCRN Alumnus RN Care Manager  Transitions of Care  [facility] - [facility] [phone]: direct office

## 2024-02-02 ENCOUNTER — Other Ambulatory Visit: Payer: Self-pay

## 2024-02-02 ENCOUNTER — Telehealth: Payer: Self-pay | Admitting: *Deleted

## 2024-02-02 ENCOUNTER — Inpatient Hospital Stay
Admission: RE | Admit: 2024-02-02 | Discharge: 2024-02-02 | Disposition: A | Discharge status: Home / Self Care | Payer: Self-pay | Source: Ambulatory Visit | Attending: Hematology | Admitting: Hematology

## 2024-02-02 DIAGNOSIS — Z9049 Acquired absence of other specified parts of digestive tract: Secondary | ICD-10-CM

## 2024-02-02 DIAGNOSIS — G5603 Carpal tunnel syndrome, bilateral upper limbs: Secondary | ICD-10-CM | POA: Diagnosis not present

## 2024-02-02 NOTE — Transitions of Care (Post Inpatient/ED Visit) (Signed)
 02/02/2024  Name: Lee Lee MRN: [id number] DOB: 05-30-1951  Today's TOC FU Call Status: Today's TOC FU Call Status:: Successful TOC FU Call Completed TOC FU Call Complete Date: 02/02/24 Patient's Name and Date of Birth confirmed.  Transition Care Management Follow-up Telephone Call Date of Discharge: 01/31/24 Discharge Facility: [facility]) Type of Discharge: Inpatient Admission Primary Inpatient Discharge Diagnosis:: Surgical Small Bowel Obstruction How have you been since you were released from the hospital?: Better ("I am doing fine; no problems- I am independent and heading out of town- I'm going to be recuperating in [place]") Any questions or concerns?: No  Items Reviewed: Did you receive and understand the discharge instructions provided?: No Medications obtained,verified, and reconciled?: Partial Review Completed Reason for Partial Mediation Review: Patient declined- he is not at home- he is out running errands; confirms he obtained medications prescribed at time of hospital discharge; confirms self-manages medications, denies concerns around medications Any new allergies since your discharge?: No Dietary orders reviewed?: Yes Type of Diet Ordered:: "Healthy" Do you have support at home?: Yes People in Home: spouse Name of Support/Comfort Primary Source: Reports independent in self-care activities; supportive spouse assists as/ if needed/ indicated  Medications Reviewed Today: Medications Reviewed Today     Reviewed by Michaela Corner, RN (Registered Nurse) on 02/02/24 at 1545  Med List Status: <None>   Medication Order Taking? Sig Documenting Provider Last Dose Status Informant  acetaminophen (TYLENOL) 500 MG tablet [id number]  Take 1,500 mg by mouth every 8 (eight) hours as needed for moderate pain (pain score 4-6). [provider]  Active Self  b complex vitamins capsule [id number]  Take 1 capsule by mouth daily. Etta Grandchild, MD  Active Self   busPIRone (BUSPAR) 5 MG tablet [id number]  TAKE ONE TABLET BY MOUTH THREE TIMES A DAY Etta Grandchild, MD  Active Self  desvenlafaxine (PRISTIQ) 50 MG 24 hr tablet [id number]  TAKE 1 TABLET BY MOUTH DAILY Etta Grandchild, MD  Active Self  docusate sodium (COLACE) 100 MG capsule [id number] Yes Take 1 capsule (100 mg total) by mouth 2 (two) times daily. Okay to decrease to once daily or stop taking if having loose bowel movements Berna Bue, MD Taking Active   gabapentin (NEURONTIN) 100 MG capsule [id number]  Take 1 capsule (100 mg total) by mouth 2 (two) times daily. Worthy Rancher B, FNP  Active Self  gabapentin (NEURONTIN) 300 MG capsule [id number]  Take 300 mg by mouth 3 (three) times daily. [provider]  Active Self  losartan (COZAAR) 25 MG tablet [id number]  Take 1 tablet (25 mg total) by mouth every evening. Yates Decamp, MD  Active Self  rOPINIRole (REQUIP XL) 4 MG 24 hr tablet [id number]  TAKE 1 TABLET (4MG  TOTAL) BY MOUTH AT BEDTIME. PER MD RETURN IN ABOUT 6 MONTHS (AROUND 11/09/2023). Etta Grandchild, MD  Active Self  tirzepatide [facility]) 7.5 MG/0.5ML Pen [id number]  Inject 7.5 mg into the skin once a week. Etta Grandchild, MD  Active Self           Med Note Sherrie Mustache, [place] A   Thu Jan 12, 2024 10:56 AM) Sundays  traZODone (DESYREL) 150 MG tablet [id number]  Take 1 tablet (150 mg total) by mouth at bedtime as needed. for sleep Etta Grandchild, MD  Active Self           Home Care and Equipment/Supplies: Were Home Health Services Ordered?: No Any new equipment or medical supplies ordered?:  No  Functional Questionnaire: Do you need assistance with bathing/showering or dressing?: No Do you need assistance with meal preparation?: No Do you need assistance with eating?: No Do you have difficulty maintaining continence: No Do you need assistance with getting out of bed/getting out of a chair/moving?: No Do you have difficulty managing or taking your medications?: No  Follow up  appointments reviewed: PCP Follow-up appointment confirmed?: NA (verified not indicated per hospital discharging provider discharge notes) Specialist Hospital Follow-up appointment confirmed?: Yes Date of Specialist follow-up appointment?:  ("I can't remember the sepcific date-- but it's all scheduled within 3 weeks when I get back home from [place], like they told me to") Follow-Up Specialty Provider:: Surgical provider Do you need transportation to your follow-up appointment?: No Do you understand care options if your condition(s) worsen?: Yes-patient verbalized understanding  SDOH Interventions Today    Flowsheet Row Most Recent Value  SDOH Interventions   Food Insecurity Interventions Intervention Not Indicated  Housing Interventions Intervention Not Indicated  Transportation Interventions Intervention Not Indicated  [drives self]  Utilities Interventions Intervention Not Indicated      Interventions Today    Flowsheet Row Most Recent Value  Chronic Disease   Chronic disease during today's visit Other  [Surgical small bowel resection]  General Interventions   General Interventions Discussed/Reviewed General Interventions Discussed, Durable Medical Equipment (DME), Doctor Visits  Doctor Visits Discussed/Reviewed Doctor Visits Discussed, Specialist  Durable Medical Equipment (DME) Other  [confirmed not currently requiring/ using assistive devices for ambulation]  PCP/Specialist Visits Compliance with follow-up visit  Education Interventions   Education Provided Provided Education  Provided Verbal Education On When to see the doctor  Nutrition Interventions   Nutrition Discussed/Reviewed Nutrition Discussed  Pharmacy Interventions   Pharmacy Dicussed/Reviewed Pharmacy Topics Discussed      TOC Interventions Today    Flowsheet Row Most Recent Value  TOC Interventions   TOC Interventions Discussed/Reviewed TOC Interventions Discussed  [Patient declines need for ongoing/  further care management/ coordination outreach,  declines enrollment in 30-day TOC program- declines taking my direct phone number should needs/ concerns arise post-TOC call]      Total time spent from review to signing of note/ including any care coordination interventions:  22 minutes  Pls call/ message for questions,  Caryl Pina, RN, BSN, Media planner  Transitions of Care  [facility] Population [facility]  [place] [phone]: direct office

## 2024-02-07 ENCOUNTER — Other Ambulatory Visit: Payer: Self-pay | Admitting: Internal Medicine

## 2024-02-08 ENCOUNTER — Inpatient Hospital Stay (HOSPITAL_BASED_OUTPATIENT_CLINIC_OR_DEPARTMENT_OTHER): Admitting: Nurse Practitioner

## 2024-02-08 ENCOUNTER — Inpatient Hospital Stay: Attending: Nurse Practitioner

## 2024-02-08 VITALS — BP 120/66 | HR 68 | Temp 98.6°F | Resp 17 | Wt 214.9 lb

## 2024-02-08 DIAGNOSIS — D3A8 Other benign neuroendocrine tumors: Secondary | ICD-10-CM | POA: Insufficient documentation

## 2024-02-08 DIAGNOSIS — C7A8 Other malignant neuroendocrine tumors: Secondary | ICD-10-CM | POA: Diagnosis not present

## 2024-02-08 LAB — CMP (CANCER CENTER ONLY)
ALT: 25 U/L (ref 0–44)
AST: 25 U/L (ref 15–41)
Albumin: 4.2 g/dL (ref 3.5–5.0)
Alkaline Phosphatase: 63 U/L (ref 38–126)
Anion gap: 5 (ref 5–15)
BUN: 19 mg/dL (ref 8–23)
CO2: 29 mmol/L (ref 22–32)
Calcium: 9.2 mg/dL (ref 8.9–10.3)
Chloride: 106 mmol/L (ref 98–111)
Creatinine: 0.95 mg/dL (ref 0.61–1.24)
GFR, Estimated: >60 mL/min (ref >60)
Glucose, Bld: 105 mg/dL — ABNORMAL HIGH (ref 70–99)
Potassium: 4 mmol/L (ref 3.5–5.1)
Sodium: 140 mmol/L (ref 135–145)
Total Bilirubin: 0.4 mg/dL (ref 0.0–1.2)
Total Protein: 6.9 g/dL (ref 6.5–8.1)

## 2024-02-08 LAB — CBC WITH DIFFERENTIAL (CANCER CENTER ONLY)
Abs Immature Granulocytes: 0.04 10*3/uL (ref 0.00–0.07)
Basophils Absolute: 0 10*3/uL (ref 0.0–0.1)
Basophils Relative: 0 %
Eosinophils Absolute: 0.1 10*3/uL (ref 0.0–0.5)
Eosinophils Relative: 1 %
HCT: 32.5 % — ABNORMAL LOW (ref 39.0–52.0)
Hemoglobin: 10.9 g/dL — ABNORMAL LOW (ref 13.0–17.0)
Immature Granulocytes: 1 %
Lymphocytes Relative: 13 %
Lymphs Abs: 1 10*3/uL (ref 0.7–4.0)
MCH: 35.7 pg — ABNORMAL HIGH (ref 26.0–34.0)
MCHC: 33.5 g/dL (ref 30.0–36.0)
MCV: 106.6 fL — ABNORMAL HIGH (ref 80.0–100.0)
Monocytes Absolute: 0.5 10*3/uL (ref 0.1–1.0)
Monocytes Relative: 7 %
Neutro Abs: 6.1 10*3/uL (ref 1.7–7.7)
Neutrophils Relative %: 78 %
Platelet Count: 243 10*3/uL (ref 150–400)
RBC: 3.05 MIL/uL — ABNORMAL LOW (ref 4.22–5.81)
RDW: 13.4 % (ref 11.5–15.5)
WBC Count: 7.8 10*3/uL (ref 4.0–10.5)
nRBC: 0 % (ref 0.0–0.2)

## 2024-02-08 NOTE — Assessment & Plan Note (Signed)
 Small bowel mass found incidentally as part of a workup for urinary urgency with [facility].  A CT scan done on December 16, 2023 demonstrated "enhancing nodules in the right hemiabdominal mesentery associated with rounding enhancing foci within adjacent loops of right lower quadrant small bowel with a differential including carcinoid tumor or gist."  Additional findings on the CT included hepatic steatosis and a small hepatic cyst, mildly enlarged prostate, aortic atherosclerosis, coronary artery calcifications, and a small, nonobstructing left kidney stone.  The CT showed 1 enhancing nodule in the right Hemi abdominal central mesentery measuring 3.8 x 2.8 cm.  This was noted to be present on a CT scan in August 2016, previously measuring 3.0 x 1.9 cm.   On 01/27/2024, he did undergo laparoscopic assisted small bowel resection and biopsy of mesenteric mass.  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  Tumor invades visceral peritoneum/serosa.  Lymphovascular invasion was identified.  Surgical margins of resection are negative for tumor.  12 out of 14 lymph nodes positive for neuroendocrine tumor.  Pathology of mesentery mass shows benign fibrous tissue with chronic inflammation.  Pathology of masses from jejunum and ileum neuroendocrine tumor show multiple sites and multiple sizes.  He was labeled as multifocal histologic type and grade, well-differentiated neuroendocrine tumor, WHO grade 1.  Mitotic rate <1 mitosis/2 mm.  Ki-67 <1%.  Tumor invades visceral peritoneum with lymphovascular invasion present.  All margins are negative for tumor.  His case with discussed this morning during multidisciplinary GI tumor board.  General consensus is to closely monitor the patient with labs and baseline dotatate PET scan. -CBC, CMP, ferritin, and chromogranin A levels ordered today. -Dotatate PET scan ordered today and will be scheduled after patient returns from  [place] around Apr 02, 2024. -Will discuss results with patient when they are available.  Plan to see him back in office after 6 months.

## 2024-02-08 NOTE — Progress Notes (Addendum)
 [facility]  [phone]   HEMATOLOGY ONCOLOGY CONSULTATION   Lee Lee  DOB: 1951-07-03  MR#: [id number]  CSN#: [id number]    Requesting Physician: Dr. Doylene Canard, surgery  Patient Care Team: Etta Grandchild, MD as PCP - General (Internal Medicine) Eldred Manges, MD as Consulting Physician (Orthopedic Surgery) Kathyrn Sheriff, [facility] (Inactive) as Pharmacist (Pharmacist) Yates Decamp, MD as Consulting Physician (Cardiology) Janet Berlin, MD as Consulting Physician (Ophthalmology) [facility], [facility]  Reason for consult: small bowel masses, mesenteric mass   History of present illness:   Patient has medical history of depression, diabetes, a flutter, and hypertension.  He was referred to surgery for evaluation of a small bowel mass.  This was found incidentally as part of a workup for urinary urgency with [facility].  A CT scan done on December 16, 2023 demonstrated "enhancing nodules in the right hemiabdominal mesentery associated with rounding enhancing foci within adjacent loops of right lower quadrant small bowel with a differential including carcinoid tumor or gist."  Additional findings on the CT included hepatic steatosis and a small hepatic cyst, mildly enlarged prostate, aortic atherosclerosis, coronary artery calcifications, and a small, nonobstructing left kidney stone.  The CT showed 1 enhancing nodule in the right Hemi abdominal central mesentery measuring 3.8 x 2.8 cm.  This was noted to be present on a CT scan in August 2016, previously measuring 3.0 x 1.9 cm.   On 01/27/2024, he did undergo laparoscopic assisted small bowel resection and biopsy of mesenteric mass.  Postoperatively he had some blood in his stool and a reduction in hemoglobin level.  Levels stabilized.  On discharge, he was tolerating a regular diet, his pain was well-controlled, and he was having bowel movements and ambulating without assistance. 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  Tumor invades visceral peritoneum/serosa.  Lymphovascular invasion was identified.  Surgical margins of resection are negative for tumor.  12 out of 14 lymph nodes positive for neuroendocrine tumor.  Pathology of mesentery mass shows benign fibrous tissue with chronic inflammation.  Pathology of masses from jejunum and ileum neuroendocrine tumor show multiple sites and multiple sizes.  He was labeled as multifocal histologic type and grade, well-differentiated neuroendocrine tumor, WHO grade 1.  Mitotic rate <1 mitosis/2 mm.  Ki-67 <1%.  Tumor invades visceral peritoneum with lymphovascular invasion present.  All margins are negative for tumor.  His case with discussed this morning during multidisciplinary GI tumor board.  General consensus is to closely monitor the patient with labs and baseline dotatate PET scan. Socially, the patient is married and presents with his wife today.  Together, they have 2 children.  He is a former cigar smoker.  Quit smoking over 20 years ago.  He does continue to chew tobacco sometimes.  He formerly drank alcohol on regular basis. Reports he quit drinking 2 years ago. Today, the patient feels well in general.  He has a slight soreness along the vertical abdominal incision, but otherwise feeling well.  Appetite is good.  He denies nausea or vomiting.  Denies constipation or diarrhea.  Denies blood in his stool.  MEDICAL HISTORY:  Past Medical History:  Diagnosis Date   Alcohol abuse, episodic drinking behavior    Anxiety    Arthritis    back & knees   Atrial flutter (HCC)    s/p RFCA 01/05/13   Cancer (HCC)    skin cancer on nose   Complication of  anesthesia    makes him loopy   Depression    ED (erectile dysfunction)    Hemorrhoids    History of kidney stones    Hypertension    Pre-diabetes     SURGICAL HISTORY: Past Surgical History:  Procedure Laterality Date   ABLATION OF  DYSRHYTHMIC FOCUS  01/05/2013   ARTHROSCOPIC REPAIR ACL     ATRIAL FLUTTER ABLATION N/A 01/05/2013   Procedure: ATRIAL FLUTTER ABLATION;  Surgeon: Marinus Maw, MD;  Location: [facility] CATH LAB;  Service: Cardiovascular;  Laterality: N/A;   COLONOSCOPY  11/2009   Dr. Elnoria Howard   ELECTROPHYSIOLOGIC STUDY N/A 07/04/2015   Procedure: A-Flutter Ablation;  Surgeon: Marinus Maw, MD;  Location: [facility] INVASIVE CV LAB;  Service: Cardiovascular;  Laterality: N/A;   right knee reconstruction     ROTATOR CUFF REPAIR     TOTAL KNEE ARTHROPLASTY Left 10/30/2019   Procedure: TOTAL KNEE ARTHROPLASTY;  Surgeon: Durene Romans, MD;  Location: [facility] ORS;  Service: Orthopedics;  Laterality: Left;  70 mins   XI ROBOTIC ASSISTED SMALL BOWEL RESECTION N/A 01/27/2024   Procedure: LAPARASCOPIC ASSISTED SMALL BOWEL RESECTION;  Surgeon: Berna Bue, MD;  Location: [facility] ORS;  Service: General;  Laterality: N/A;  90 MINUTES    SOCIAL HISTORY: Social History   Socioeconomic History   Marital status: Married    Spouse name: Clydie Braun   Number of children: 2   Years of education: Not on file   Highest education level: Not on file  Occupational History   Occupation: Retired  Tobacco Use   Smoking status: Former    Types: Cigars    Quit date: 2002    Years since quitting: 23.2    Passive exposure: Past   Smokeless tobacco: Current    Types: Chew   Tobacco comments:    chews tobacco when playing golf only  Vaping Use   Vaping status: Never Used  Substance and Sexual Activity   Alcohol use: Not Currently   Drug use: No   Sexual activity: Yes    Partners: Female    Comment: married  Other Topics Concern   Not on file  Social History Narrative   Lives with wife   Social Drivers of Health   Financial Resource Strain: Low Risk  (09/12/2023)   Overall Financial Resource Strain (CARDIA)    Difficulty of Paying Living Expenses: Not hard at all  Food Insecurity: No Food Insecurity (02/02/2024)   Hunger Vital Sign     Worried About Running Out of Food in the Last Year: Never true    Ran Out of Food in the Last Year: Never true  Transportation Needs: No Transportation Needs (02/02/2024)   PRAPARE - Administrator, Civil Service (Medical): No    Lack of Transportation (Non-Medical): No  Physical Activity: Sufficiently Active (09/12/2023)   Exercise Vital Sign    Days of Exercise per Week: 7 days    Minutes of Exercise per Session: 30 min  Stress: No Stress Concern Present (09/12/2023)   [employer] of Occupational Health - Occupational Stress Questionnaire    Feeling of Stress : Not at all  Social Connections: Unknown (01/27/2024)   Social Connection and Isolation Panel [NHANES]    Frequency of Communication with Friends and Family: Patient declined    Frequency of Social Gatherings with Friends and Family: Patient declined    Attends Religious Services: Patient declined    Database administrator or Organizations: Patient declined  Attends Banker Meetings: Patient declined    Marital Status: Married  Catering manager Violence: Not At Risk (02/02/2024)   Humiliation, Afraid, Rape, and Kick questionnaire    Fear of Current or Ex-Partner: No    Emotionally Abused: No    Physically Abused: No    Sexually Abused: No    FAMILY HISTORY: Family History  Problem Relation Age of Onset   Valvular heart disease Mother    Lymphoma Mother    Restless legs syndrome Mother    Neuropathy Neg Hx     ALLERGIES:  has no known allergies.  MEDICATIONS:  Current Outpatient Medications  Medication Sig Dispense Refill   b complex vitamins capsule Take 1 capsule by mouth daily. 90 capsule 1   busPIRone (BUSPAR) 5 MG tablet TAKE ONE TABLET BY MOUTH THREE TIMES A DAY 270 tablet 0   desvenlafaxine (PRISTIQ) 50 MG 24 hr tablet TAKE 1 TABLET BY MOUTH DAILY 90 tablet 0   docusate sodium (COLACE) 100 MG capsule Take 1 capsule (100 mg total) by mouth 2 (two) times daily. Okay to decrease to  once daily or stop taking if having loose bowel movements 30 capsule 0   gabapentin (NEURONTIN) 300 MG capsule Take 300 mg by mouth 3 (three) times daily.     losartan (COZAAR) 25 MG tablet Take 1 tablet (25 mg total) by mouth every evening. 90 tablet 3   rOPINIRole (REQUIP XL) 4 MG 24 hr tablet TAKE 1 TABLET (4MG  TOTAL) BY MOUTH AT BEDTIME. PER MD RETURN IN ABOUT 6 MONTHS (AROUND 11/09/2023). 90 tablet 0   tirzepatide (MOUNJARO) 7.5 MG/0.5ML Pen Inject 7.5 mg into the skin once a week. 6 mL 1   traZODone (DESYREL) 150 MG tablet Take 1 tablet (150 mg total) by mouth at bedtime as needed. for sleep 90 tablet 0   acetaminophen (TYLENOL) 500 MG tablet Take 1,500 mg by mouth every 8 (eight) hours as needed for moderate pain (pain score 4-6).     gabapentin (NEURONTIN) 100 MG capsule Take 1 capsule (100 mg total) by mouth 2 (two) times daily. 60 capsule 0   No current facility-administered medications for this visit.    REVIEW OF SYSTEMS:   Constitutional: Denies fevers, chills or abnormal night sweats Eyes: Denies blurriness of vision, double vision or watery eyes Ears, nose, mouth, throat, and face: Denies mucositis or sore throat Respiratory: Denies cough, dyspnea or wheezes Cardiovascular: Denies palpitation, chest discomfort or lower extremity swelling Gastrointestinal:  Denies nausea, heartburn or change in bowel habits.  Reports some tenderness along the vertical abdominal incision.  No other abdominal tenderness today. Skin: Denies abnormal skin rashes Lymphatics: Denies new lymphadenopathy or easy bruising Neurological:Denies numbness, tingling or new weaknesses Behavioral/Psych: Lee is stable, no new changes  All other systems were reviewed with the patient and are negative.  PHYSICAL EXAMINATION: ECOG PERFORMANCE STATUS: 1 - Symptomatic but completely ambulatory  Vitals:   02/08/24 1407  BP: 120/66  Pulse: 68  Resp: 17  Temp: 98.6 F (37 C)  SpO2: 99%   Filed Weights    02/08/24 1407  Weight: 214 lb 14.4 oz (97.5 kg)    GENERAL:alert, no distress and comfortable SKIN: skin color, texture, turgor are normal, no rashes or significant lesions EYES: normal, conjunctiva are pink and non-injected, sclera clear OROPHARYNX:no exudate, no erythema and lips, buccal mucosa, and tongue normal  NECK: supple, thyroid normal size, non-tender, without nodularity LYMPH:  no palpable lymphadenopathy in the cervical, axillary or inguinal  LUNGS: clear to auscultation and percussion with normal breathing effort HEART: regular rate & rhythm and no murmurs and no lower extremity edema ABDOMEN:abdomen soft, non-tender and normal bowel sounds.  Midline abdominal scar is covered by Steri-Strips which are dry and partially peeling.  There is no redness or warmth associated with incision.  No evidence of infection.  No wound draining present.  No masses can be felt with the patient of the abdomen today. Musculoskeletal:no cyanosis of digits and no clubbing  PSYCH: alert & oriented x 3 with fluent speech NEURO: no focal motor/sensory deficits  LABORATORY DATA:  I have reviewed the data as listed Lab Results  Component Value Date   WBC 7.8 02/08/2024   HGB 10.9 (L) 02/08/2024   HCT 32.5 (L) 02/08/2024   MCV 106.6 (H) 02/08/2024   PLT 243 02/08/2024   Recent Labs    05/10/23 1044 01/18/24 0911 01/28/24 0525 01/29/24 0455  NA 139 137 138 137  K 4.5 3.9 4.1 4.0  CL 104 100 103 105  CO2 28 28 28 28   GLUCOSE 88 114* 142* 116*  BUN 14 15 14 21   CREATININE 0.80 0.74 0.65 0.76  CALCIUM 9.2 9.4 8.5* 8.1*  GFRNONAA  --  >60 >60 >60  PROT 6.9  --   --   --   ALBUMIN 4.1  --   --   --   AST 56*  --   --   --   ALT 59*  --   --   --   ALKPHOS 66  --   --   --   BILITOT 0.6  --   --   --   BILIDIR 0.2  --   --   --     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  A CT scan done on December 16, 2023 demonstrated "enhancing nodules in the right hemiabdominal mesentery associated with rounding enhancing foci within adjacent loops of right lower quadrant small bowel with a differential including carcinoid tumor or gist."  Additional findings on the CT included hepatic steatosis and a small hepatic cyst, mildly enlarged prostate, aortic atherosclerosis, coronary artery calcifications, and a small, nonobstructing left kidney stone.  The CT showed 1 enhancing nodule in the right Hemi abdominal central mesentery measuring 3.8 x 2.8 cm.  This was noted to be present on a CT scan in August 2016, previously measuring 3.0 x 1.9 cm.   On 01/27/2024, he did undergo laparoscopic assisted small bowel resection and biopsy of mesenteric mass.  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  Tumor invades visceral peritoneum/serosa.  Lymphovascular invasion was identified.  Surgical margins of resection are negative for tumor.  12 out of 14 lymph nodes positive for neuroendocrine tumor.  Pathology of mesentery mass shows benign fibrous tissue with chronic inflammation.  Pathology of masses from jejunum and ileum neuroendocrine tumor show multiple sites and multiple sizes.  He was labeled as multifocal histologic type and grade, well-differentiated neuroendocrine tumor, WHO grade 1.  Mitotic rate <1 mitosis/2 mm.  Ki-67 <1%.  Tumor invades visceral peritoneum with lymphovascular invasion present.  All margins are negative for tumor.  His case with discussed this morning during multidisciplinary GI tumor board.  General consensus is to closely monitor the patient with labs and baseline dotatate PET scan. -CBC, CMP, ferritin, and chromogranin A  levels ordered today. -Dotatate PET scan ordered today and will be scheduled after patient returns from [place] around Apr 02, 2024. -Will discuss results with patient when  they are available.  Plan to see him back in office after 6 months.  Orders: -     NM PET DOTATATE SKULL BASE TO MID THIGH; Future  Benign neuroendocrine tumor of small intestine -     CBC with Differential (Cancer Center Only); Future -     CMP (Cancer Center only); Future -     Chromogranin A; Future -     Ferritin; Future  The patient was seen along with Dr. Mosetta Putt today. Time spent with the patient was approximately 30 minutes. This time included reviewing progress notes, labs, imaging studies, and discussing plan for follow up.  All questions were answered. The patient knows to call the clinic with any problems, questions or concerns.    Carlean Jews, NP 02/08/2024 3:38 PM  Addendum I have seen the patient, examined him. I agree with the assessment and and plan and have edited the notes.   73 year old gentleman presented with screening discovered small bowel neuroendocrine tumor, status post small bowel resection and mesentery lymph node biopsy.  We reviewed her case in the tumor board this morning with Dr. Fredricka Bonine.  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  Although the mesentery node biopsy was negative, we also suspect that he has residual nodal metastasis.  We recommend dotatate PET scan for further evaluation, when he returns from his trip in early May.  Patient is totally asymptomatic, will not require any treatment even with residual disease.  I discussed the indolent nature of neuroendocrine tumor, and overall good prognosis.  I answered all his questions.  I spent a total of 45 minutes for his visit today, more than 50% time on face-to-face counseling.  Lee Mood MD 02/08/2024

## 2024-02-09 ENCOUNTER — Telehealth: Payer: Self-pay | Admitting: Nurse Practitioner

## 2024-02-09 ENCOUNTER — Other Ambulatory Visit: Payer: Self-pay

## 2024-02-09 LAB — FERRITIN: Ferritin: 98 ng/mL (ref 24–336)

## 2024-02-09 MED ORDER — GABAPENTIN 300 MG PO CAPS: 300.0000 mg | ORAL_CAPSULE | Freq: Three times a day (TID) | ORAL | 0 refills | Status: DC

## 2024-02-09 NOTE — Telephone Encounter (Signed)
 Per scheduling orders on 02/08/2024 left patient a voicemail in regards to scheduled appointments; left callback number for scheduling line if patient is needing to cancel or reschedule appointment times/dates

## 2024-02-09 NOTE — Progress Notes (Signed)
 The proposed treatment discussed in conference is for discussion purpose only and is not a binding recommendation.  The patients have not been physically examined, or presented with their treatment options.  Therefore, final treatment plans cannot be decided.

## 2024-02-09 NOTE — Progress Notes (Signed)
 PATIENT NAVIGATOR PROGRESS NOTE  Name: Lee Lee Date: 02/09/2024 MRN: [id number]  DOB: July 04, 1951   Reason for visit:  New Patient Visit  Comments:  Patient seen during initial appointment on 02/08/24 with Vincent Gros, NP and Dr. Malachy Mood.  Patient given contact information and instructed to contact office with any questions or concerns.     Time spent counseling/coordinating care: 15-30 minutes

## 2024-02-10 LAB — CHROMOGRANIN A: Chromogranin A (ng/mL): 137.6 ng/mL — ABNORMAL HIGH (ref 0.0–101.8)

## 2024-02-12 ENCOUNTER — Encounter: Payer: Self-pay | Admitting: Nurse Practitioner

## 2024-03-18 ENCOUNTER — Other Ambulatory Visit: Payer: Self-pay | Admitting: Internal Medicine

## 2024-03-18 DIAGNOSIS — F411 Generalized anxiety disorder: Secondary | ICD-10-CM

## 2024-03-18 DIAGNOSIS — F332 Major depressive disorder, recurrent severe without psychotic features: Secondary | ICD-10-CM

## 2024-03-19 ENCOUNTER — Telehealth: Payer: Self-pay | Admitting: Internal Medicine

## 2024-03-19 DIAGNOSIS — F411 Generalized anxiety disorder: Secondary | ICD-10-CM

## 2024-03-19 DIAGNOSIS — F332 Major depressive disorder, recurrent severe without psychotic features: Secondary | ICD-10-CM

## 2024-03-19 NOTE — Telephone Encounter (Signed)
 Copied from CRM [id number]. Topic: Clinical - Medication Refill >> Mar 19, 2024  8:27 AM Marissa P wrote: Most Recent Primary Care Visit:  Provider: Gary Kapur  Department: [facility]  Visit Type: MEDICARE AWV, SEQUENTIAL  Date: 09/12/2023  Medication:   traZODone  (DESYREL ) 150 MG tablet and gabapentin  (NEURONTIN ) 300 MG capsule  Has the patient contacted their pharmacy? Yes (Agent: If no, request that the patient contact the pharmacy for the refill. If patient does not wish to contact the pharmacy document the reason why and proceed with request.) (Agent: If yes, when and what did the pharmacy advise?)  Is this the correct pharmacy for this prescription? Yes If no, delete pharmacy and type the correct one.  This is the patient's preferred pharmacy:  [facility]  [address] [place]  [phone]     Has the prescription been filled recently? No  Is the patient out of the medication? Yes  Has the patient been seen for an appointment in the last year OR does the patient have an upcoming appointment? Yes  Can we respond through MyChart? Yes  Agent: Please be advised that Rx refills may take up to 3 business days. We ask that you follow-up with your pharmacy.

## 2024-03-19 NOTE — Telephone Encounter (Signed)
 Patient was advised that it is noted that an appointment was needed for further refills on his Gabapentin  according to the prescription notes. Patient states that he just saw his PCP in January and he won't be back in town until the week of May 5th. Patient states that he won't be able to sleep if he runs out of medication and he will run out of his Gabapentin  tomorrow.  Patient states that he is on vacation in [place]  and would like his two prescription refills of Gabapentin  and Trazadone sent to: [facility] [address] [place]  [place]  [address]  Patient states that his PCP is familiar with him and he medications and his PCP can call him about it if he would like.

## 2024-03-19 NOTE — Telephone Encounter (Signed)
 Copied from CRM [id number]. Topic: Clinical - Medication Refill >> Mar 19, 2024  8:27 AM Marissa P wrote: Most Recent Primary Care Visit:  Provider: Gary Kapur  Department: [facility]  Visit Type: MEDICARE AWV, SEQUENTIAL  Date: 09/12/2023  Medication:   traZODone  (DESYREL ) 150 MG tablet and gabapentin  (NEURONTIN ) 300 MG capsule  Has the patient contacted their pharmacy? Yes (Agent: If no, request that the patient contact the pharmacy for the refill. If patient does not wish to contact the pharmacy document the reason why and proceed with request.) (Agent: If yes, when and what did the pharmacy advise?)  Is this the correct pharmacy for this prescription?  If no, delete pharmacy and type the correct one.  This is the patient's preferred pharmacy:  [facility]  [address] [place]  [phone]     Has the prescription been filled recently? No  Is the patient out of the medication? Yes  Has the patient been seen for an appointment in the last year OR does the patient have an upcoming appointment? Yes  Can we respond through MyChart? Yes  Agent: Please be advised that Rx refills may take up to 3 business days. We ask that you follow-up with your pharmacy.

## 2024-03-20 NOTE — Telephone Encounter (Signed)
 Copied from CRM [id number]. Topic: Clinical - Medication Refill >> Mar 19, 2024  8:27 AM Marissa P wrote: Most Recent Primary Care Visit:  Provider: Gary Kapur  Department: [facility]  Visit Type: MEDICARE AWV, SEQUENTIAL  Date: 09/12/2023  Medication:   traZODone  (DESYREL ) 150 MG tablet and gabapentin  (NEURONTIN ) 300 MG capsule  Has the patient contacted their pharmacy? Yes (Agent: If no, request that the patient contact the pharmacy for the refill. If patient does not wish to contact the pharmacy document the reason why and proceed with request.) (Agent: If yes, when and what did the pharmacy advise?)  Is this the correct pharmacy for this prescription? Yes If no, delete pharmacy and type the correct one.  This is the patient's preferred pharmacy:  [facility]  [address] [place]  [phone]     Has the prescription been filled recently? No  Is the patient out of the medication? Yes  Has the patient been seen for an appointment in the last year OR does the patient have an upcoming appointment? Yes  Can we respond through MyChart? Yes  Agent: Please be advised that Rx refills may take up to 3 business days. We ask that you follow-up with your pharmacy. >> Mar 20, 2024  9:44 AM Kita Perish H wrote: Patient is following up on refill request for his traZODone  (DESYREL ) 150 MG tablet and gabapentin  (NEURONTIN ) 300 MG capsule, states he's waiting on a call back from the nurse to verify if supply will be sent to pharmacy to hold him over, please reach out to patient, thanks.   Malike [phone]

## 2024-03-21 NOTE — Telephone Encounter (Signed)
 Copied from CRM [id number]. Topic: Clinical - Prescription Issue >> Mar 21, 2024  7:59 AM Freya Jesus wrote: Reason for CRM:  Patient calling because he placed a request for traZODone  (DESYREL ) 150 MG tablet [[id number]] and gabapentin  (NEURONTIN ) 300 MG capsule [[id number]]. Stated he is completely out of them and is requesting we send it to [facility] [address] [place]  [phone]. Also requesting a call back when his prescriptions are sent.

## 2024-03-21 NOTE — Telephone Encounter (Signed)
 Copied from CRM [id number]. Topic: General - Other >> Mar 21, 2024  2:54 PM Adonis Hoot wrote: Reason for CRM: Patient called in stating that he is on vacation in [place]  until May 5th. He would like to know Is there any way possible that he could have a few pills  sent to pharmacy there to last him until he makes it in for ana appointment once he returns.He scheduled for May 14th.  gabapentin  (NEURONTIN ) 300 MG capsule traZODone  (DESYREL ) 150 MG tablet  [facility]  [address] [place]  [phone]

## 2024-03-22 ENCOUNTER — Other Ambulatory Visit: Payer: Self-pay

## 2024-03-22 DIAGNOSIS — F332 Major depressive disorder, recurrent severe without psychotic features: Secondary | ICD-10-CM

## 2024-03-22 DIAGNOSIS — F411 Generalized anxiety disorder: Secondary | ICD-10-CM

## 2024-03-22 MED ORDER — TRAZODONE HCL 150 MG PO TABS: 150.0000 mg | ORAL_TABLET | Freq: Every evening | ORAL | 0 refills | Status: DC | PRN

## 2024-03-22 MED ORDER — GABAPENTIN 300 MG PO CAPS: 300.0000 mg | ORAL_CAPSULE | Freq: Three times a day (TID) | ORAL | 0 refills | Status: DC

## 2024-03-22 NOTE — Telephone Encounter (Signed)
 Medication refill has been sent to Dr. Yetta Barre

## 2024-04-02 ENCOUNTER — Encounter (HOSPITAL_COMMUNITY)
Admission: RE | Admit: 2024-04-02 | Discharge: 2024-04-02 | Disposition: A | Discharge status: Home / Self Care | Source: Ambulatory Visit | Attending: Nurse Practitioner | Admitting: Nurse Practitioner

## 2024-04-02 DIAGNOSIS — C7A8 Other malignant neuroendocrine tumors: Secondary | ICD-10-CM | POA: Insufficient documentation

## 2024-04-02 MED ORDER — COPPER CU 64 DOTATATE 1 MCI/ML IV SOLN
4.0000 | Freq: Once | INTRAVENOUS | Status: AC
  Administered 2024-04-02: 4.1 via INTRAVENOUS

## 2024-04-04 ENCOUNTER — Encounter (HOSPITAL_COMMUNITY): Payer: Self-pay

## 2024-04-07 ENCOUNTER — Other Ambulatory Visit: Payer: Self-pay | Admitting: Internal Medicine

## 2024-04-07 DIAGNOSIS — F332 Major depressive disorder, recurrent severe without psychotic features: Secondary | ICD-10-CM

## 2024-04-10 DIAGNOSIS — M545 Low back pain, unspecified: Secondary | ICD-10-CM | POA: Diagnosis not present

## 2024-04-10 DIAGNOSIS — M542 Cervicalgia: Secondary | ICD-10-CM | POA: Diagnosis not present

## 2024-04-11 ENCOUNTER — Ambulatory Visit: Payer: Self-pay | Admitting: Internal Medicine

## 2024-04-11 ENCOUNTER — Ambulatory Visit: Admitting: Internal Medicine

## 2024-04-11 ENCOUNTER — Encounter: Payer: Self-pay | Admitting: Internal Medicine

## 2024-04-11 VITALS — BP 142/86 | HR 72 | Temp 98.3°F | Resp 16 | Ht 71.0 in | Wt 219.4 lb

## 2024-04-11 DIAGNOSIS — D508 Other iron deficiency anemias: Secondary | ICD-10-CM

## 2024-04-11 DIAGNOSIS — R233 Spontaneous ecchymoses: Secondary | ICD-10-CM | POA: Diagnosis not present

## 2024-04-11 DIAGNOSIS — G619 Inflammatory polyneuropathy, unspecified: Secondary | ICD-10-CM | POA: Diagnosis not present

## 2024-04-11 DIAGNOSIS — F332 Major depressive disorder, recurrent severe without psychotic features: Secondary | ICD-10-CM | POA: Diagnosis not present

## 2024-04-11 DIAGNOSIS — F411 Generalized anxiety disorder: Secondary | ICD-10-CM

## 2024-04-11 DIAGNOSIS — G622 Polyneuropathy due to other toxic agents: Secondary | ICD-10-CM

## 2024-04-11 DIAGNOSIS — I10 Essential (primary) hypertension: Secondary | ICD-10-CM | POA: Diagnosis not present

## 2024-04-11 DIAGNOSIS — E118 Type 2 diabetes mellitus with unspecified complications: Secondary | ICD-10-CM | POA: Diagnosis not present

## 2024-04-11 DIAGNOSIS — E785 Hyperlipidemia, unspecified: Secondary | ICD-10-CM

## 2024-04-11 DIAGNOSIS — Z0000 Encounter for general adult medical examination without abnormal findings: Secondary | ICD-10-CM

## 2024-04-11 DIAGNOSIS — M21969 Unspecified acquired deformity of unspecified lower leg: Secondary | ICD-10-CM | POA: Insufficient documentation

## 2024-04-11 DIAGNOSIS — Z0001 Encounter for general adult medical examination with abnormal findings: Secondary | ICD-10-CM

## 2024-04-11 DIAGNOSIS — G2581 Restless legs syndrome: Secondary | ICD-10-CM

## 2024-04-11 DIAGNOSIS — E519 Thiamine deficiency, unspecified: Secondary | ICD-10-CM

## 2024-04-11 LAB — LIPID PANEL
Cholesterol: 182 mg/dL (ref 0–200)
HDL: 106.1 mg/dL (ref >39.00)
LDL Cholesterol: 64 mg/dL (ref 0–99)
NonHDL: 76.15
Total CHOL/HDL Ratio: 2
Triglycerides: 63 mg/dL (ref 0.0–149.0)
VLDL: 12.6 mg/dL (ref 0.0–40.0)

## 2024-04-11 LAB — URINALYSIS, ROUTINE W REFLEX MICROSCOPIC
Bilirubin Urine: NEGATIVE
Leukocytes,Ua: NEGATIVE
Nitrite: NEGATIVE
Specific Gravity, Urine: 1.025 (ref 1.000–1.030)
Total Protein, Urine: NEGATIVE
Urine Glucose: NEGATIVE
Urobilinogen, UA: 0.2 (ref 0.0–1.0)
pH: 6 (ref 5.0–8.0)

## 2024-04-11 LAB — APTT: aPTT: 30.1 s (ref 25.4–36.8)

## 2024-04-11 LAB — CBC WITH DIFFERENTIAL/PLATELET
Basophils Absolute: 0 10*3/uL (ref 0.0–0.1)
Basophils Relative: 0.4 % (ref 0.0–3.0)
Eosinophils Absolute: 0.1 10*3/uL (ref 0.0–0.7)
Eosinophils Relative: 1.2 % (ref 0.0–5.0)
HCT: 46.7 % (ref 39.0–52.0)
Hemoglobin: 15.9 g/dL (ref 13.0–17.0)
Lymphocytes Relative: 19.2 % (ref 12.0–46.0)
Lymphs Abs: 1.1 10*3/uL (ref 0.7–4.0)
MCHC: 34.1 g/dL (ref 30.0–36.0)
MCV: 104.7 fl — ABNORMAL HIGH (ref 78.0–100.0)
Monocytes Absolute: 0.5 10*3/uL (ref 0.1–1.0)
Monocytes Relative: 8.4 % (ref 3.0–12.0)
Neutro Abs: 4.2 10*3/uL (ref 1.4–7.7)
Neutrophils Relative %: 70.8 % (ref 43.0–77.0)
Platelets: 166 10*3/uL (ref 150.0–400.0)
RBC: 4.46 Mil/uL (ref 4.22–5.81)
RDW: 14.2 % (ref 11.5–15.5)
WBC: 5.9 10*3/uL (ref 4.0–10.5)

## 2024-04-11 LAB — IBC + FERRITIN
Ferritin: 44.5 ng/mL (ref 22.0–322.0)
Iron: 115 ug/dL (ref 42–165)
Saturation Ratios: 33.4 % (ref 20.0–50.0)
TIBC: 344.4 ug/dL (ref 250.0–450.0)
Transferrin: 246 mg/dL (ref 212.0–360.0)

## 2024-04-11 LAB — TSH: TSH: 0.96 u[IU]/mL (ref 0.35–5.50)

## 2024-04-11 LAB — MICROALBUMIN / CREATININE URINE RATIO
Creatinine,U: 137.8 mg/dL
Microalb Creat Ratio: 19 mg/g (ref 0.0–30.0)
Microalb, Ur: 2.6 mg/dL — ABNORMAL HIGH (ref 0.0–1.9)

## 2024-04-11 LAB — PROTIME-INR
INR: 1 ratio (ref 0.8–1.0)
Prothrombin Time: 11 s (ref 9.6–13.1)

## 2024-04-11 LAB — HEMOGLOBIN A1C: Hgb A1c MFr Bld: 5 % (ref 4.6–6.5)

## 2024-04-11 MED ORDER — GABAPENTIN 300 MG PO CAPS: 300.0000 mg | ORAL_CAPSULE | Freq: Three times a day (TID) | ORAL | 0 refills | Status: DC

## 2024-04-11 MED ORDER — ROPINIROLE HCL ER 4 MG PO TB24: 4.0000 mg | ORAL_TABLET | Freq: Every day | ORAL | 0 refills | Status: DC

## 2024-04-11 MED ORDER — BUSPIRONE HCL 5 MG PO TABS: 5.0000 mg | ORAL_TABLET | Freq: Three times a day (TID) | ORAL | 0 refills | Status: DC

## 2024-04-11 MED ORDER — DESVENLAFAXINE SUCCINATE ER 50 MG PO TB24: 50.0000 mg | ORAL_TABLET | Freq: Every day | ORAL | 0 refills | Status: DC

## 2024-04-11 MED ORDER — TRAZODONE HCL 150 MG PO TABS: 150.0000 mg | ORAL_TABLET | Freq: Every day | ORAL | 0 refills | Status: DC

## 2024-04-11 MED ORDER — TIRZEPATIDE 7.5 MG/0.5ML ~~LOC~~ SOAJ: 7.5000 mg | SUBCUTANEOUS | 1 refills | Status: DC

## 2024-04-11 NOTE — Patient Instructions (Signed)
 Health Maintenance, Male  Adopting a healthy lifestyle and getting preventive care are important in promoting health and wellness. Ask your health care provider about:  The right schedule for you to have regular tests and exams.  Things you can do on your own to prevent diseases and keep yourself healthy.  What should I know about diet, weight, and exercise?  Eat a healthy diet    Eat a diet that includes plenty of vegetables, fruits, low-fat dairy products, and lean protein.  Do not eat a lot of foods that are high in solid fats, added sugars, or sodium.  Maintain a healthy weight  Body mass index (BMI) is a measurement that can be used to identify possible weight problems. It estimates body fat based on height and weight. Your health care provider can help determine your BMI and help you achieve or maintain a healthy weight.  Get regular exercise  Get regular exercise. This is one of the most important things you can do for your health. Most adults should:  Exercise for at least 150 minutes each week. The exercise should increase your heart rate and make you sweat (moderate-intensity exercise).  Do strengthening exercises at least twice a week. This is in addition to the moderate-intensity exercise.  Spend less time sitting. Even light physical activity can be beneficial.  Watch cholesterol and blood lipids  Have your blood tested for lipids and cholesterol at 71 years of age, then have this test every 5 years.  You may need to have your cholesterol levels checked more often if:  Your lipid or cholesterol levels are high.  You are older than 71 years of age.  You are at high risk for heart disease.  What should I know about cancer screening?  Many types of cancers can be detected early and may often be prevented. Depending on your health history and family history, you may need to have cancer screening at various ages. This may include screening for:  Colorectal cancer.  Prostate cancer.  Skin cancer.  Lung  cancer.  What should I know about heart disease, diabetes, and high blood pressure?  Blood pressure and heart disease  High blood pressure causes heart disease and increases the risk of stroke. This is more likely to develop in people who have high blood pressure readings or are overweight.  Talk with your health care provider about your target blood pressure readings.  Have your blood pressure checked:  Every 3-5 years if you are 9-95 years of age.  Every year if you are 85 years old or older.  If you are between the ages of 29 and 29 and are a current or former smoker, ask your health care provider if you should have a one-time screening for abdominal aortic aneurysm (AAA).  Diabetes  Have regular diabetes screenings. This checks your fasting blood sugar level. Have the screening done:  Once every three years after age 23 if you are at a normal weight and have a low risk for diabetes.  More often and at a younger age if you are overweight or have a high risk for diabetes.  What should I know about preventing infection?  Hepatitis B  If you have a higher risk for hepatitis B, you should be screened for this virus. Talk with your health care provider to find out if you are at risk for hepatitis B infection.  Hepatitis C  Blood testing is recommended for:  Everyone born from [birth year] through 1963.  Anyone  with known risk factors for hepatitis C.  Sexually transmitted infections (STIs)  You should be screened each year for STIs, including gonorrhea and chlamydia, if:  You are sexually active and are younger than 71 years of age.  You are older than 71 years of age and your health care provider tells you that you are at risk for this type of infection.  Your sexual activity has changed since you were last screened, and you are at increased risk for chlamydia or gonorrhea. Ask your health care provider if you are at risk.  Ask your health care provider about whether you are at high risk for HIV. Your health care provider  may recommend a prescription medicine to help prevent HIV infection. If you choose to take medicine to prevent HIV, you should first get tested for HIV. You should then be tested every 3 months for as long as you are taking the medicine.  Follow these instructions at home:  Alcohol use  Do not drink alcohol if your health care provider tells you not to drink.  If you drink alcohol:  Limit how much you have to 0-2 drinks a day.  Know how much alcohol is in your drink. In the [place] one drink equals one 12 oz bottle of beer (355 mL), one 5 oz glass of wine (148 mL), or one 1 oz glass of hard liquor (44 mL).  Lifestyle  Do not use any products that contain nicotine or tobacco. These products include cigarettes, chewing tobacco, and vaping devices, such as e-cigarettes. If you need help quitting, ask your health care provider.  Do not use street drugs.  Do not share needles.  Ask your health care provider for help if you need support or information about quitting drugs.  General instructions  Schedule regular health, dental, and eye exams.  Stay current with your vaccines.  Tell your health care provider if:  You often feel depressed.  You have ever been abused or do not feel safe at home.  Summary  Adopting a healthy lifestyle and getting preventive care are important in promoting health and wellness.  Follow your health care provider's instructions about healthy diet, exercising, and getting tested or screened for diseases.  Follow your health care provider's instructions on monitoring your cholesterol and blood pressure.  This information is not intended to replace advice given to you by your health care provider. Make sure you discuss any questions you have with your health care provider.  Document Revised: 04/06/2021 Document Reviewed: 04/06/2021  Elsevier Patient Education  2024 [employer].

## 2024-04-11 NOTE — Progress Notes (Unsigned)
 Subjective:  Patient ID: Lee Lee, male    DOB: 04-Dec-1950  Age: 73 y.o. MRN: [id number]  CC: Annual Exam, Hypertension, Hyperlipidemia, Anemia, and Diabetes   HPI Lee Lee presents for a CPX and f/up ----  Discussed the use of AI scribe software for clinical note transcription with the patient, who gave verbal consent to proceed.  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  He underwent a laparoscopic operation where fourteen tumors were found, twelve of which were cancerous. A PET scan conducted a week ago revealed a remaining four-centimeter tumor that could not be removed due to its proximity to major blood vessels. A biopsy of this tumor was performed and was found to be benign.  He feels depressed, anxious, and nervous following these events. No abdominal pain, nausea, or vomiting. His appetite is about half of what it used to be, which he attributes to Mounjaro .  Post-operatively, he experienced significant blood in his stool, which has since resolved. He was hospitalized for six days due to low hemoglobin levels and was treated for anemia during that time. He is not currently taking iron supplements.  He has a history of diabetic neuropathy, with his last diabetic foot exam being a year ago. No symptoms of excessive thirst or urination.       Outpatient Medications Prior to Visit  Medication Sig Dispense Refill  . b complex vitamins capsule Take 1 capsule by mouth daily. 90 capsule 1  . losartan  (COZAAR ) 25 MG tablet Take 1 tablet (25 mg total) by mouth every evening. 90 tablet 3  . busPIRone  (BUSPAR ) 5 MG tablet TAKE ONE TABLET BY MOUTH THREE TIMES A DAY 270 tablet 0  . desvenlafaxine  (PRISTIQ ) 50 MG 24 hr tablet TAKE 1 TABLET BY MOUTH DAILY 90 tablet 0  . gabapentin  (NEURONTIN ) 300 MG capsule Take 1 capsule (300 mg total) by mouth 3  (three) times daily. Schedule an appointment for further refills 60 capsule 0  . rOPINIRole  (REQUIP  XL) 4 MG 24 hr tablet TAKE 1 TABLET (4MG  TOTAL) BY MOUTH AT BEDTIME. PER MD RETURN IN ABOUT 6 MONTHS (AROUND 11/09/2023). 90 tablet 0  . tirzepatide  (MOUNJARO ) 7.5 MG/0.5ML Pen Inject 7.5 mg into the skin once a week. 6 mL 1  . traZODone  (DESYREL ) 150 MG tablet Take 1 tablet (150 mg total) by mouth at bedtime as needed. for sleep 20 tablet 0  . acetaminophen  (TYLENOL ) 500 MG tablet Take 1,500 mg by mouth every 8 (eight) hours as needed for moderate pain (pain score 4-6).    . gabapentin  (NEURONTIN ) 100 MG capsule Take 1 capsule (100 mg total) by mouth 2 (two) times daily. 60 capsule 0   No facility-administered medications prior to visit.    ROS Review of Systems  Constitutional:  Negative for appetite change, chills, diaphoresis, fatigue and fever.  HENT: Negative.  Negative for nosebleeds and trouble swallowing.   Eyes: Negative.   Respiratory: Negative.  Negative for cough, chest tightness, shortness of breath and wheezing.   Cardiovascular:  Negative for chest pain, palpitations and leg swelling.  Gastrointestinal: Negative.  Negative for abdominal pain, blood in stool, constipation, diarrhea, nausea and vomiting.  Endocrine: Negative.   Genitourinary: Negative.  Negative for difficulty urinating, flank pain, hematuria and urgency.  Musculoskeletal: Negative.  Negative for arthralgias and myalgias.  Skin: Negative.  Negative for color change.  Neurological:  Positive for numbness. Negative for dizziness and weakness.  Hematological:  Negative for adenopathy. Bruises/bleeds easily.  Psychiatric/Behavioral:  Positive for dysphoric mood. Negative for agitation, behavioral problems, confusion, sleep disturbance and suicidal ideas. The patient is nervous/anxious.     Objective:  BP (!) 142/86 (BP Location: Left Arm, Patient Position: Sitting, Cuff Size: Normal)   Pulse 72   Temp 98.3 F  (36.8 C) (Oral)   Resp 16   Ht 5\' 11"  (1.803 m)   Wt 219 lb 6.4 oz (99.5 kg)   SpO2 96%   BMI 30.60 kg/m   BP Readings from Last 3 Encounters:  04/11/24 (!) 142/86  02/08/24 120/66  01/31/24 122/78    Wt Readings from Last 3 Encounters:  04/11/24 219 lb 6.4 oz (99.5 kg)  02/08/24 214 lb 14.4 oz (97.5 kg)  01/28/24 231 lb 0.7 oz (104.8 kg)    Physical Exam Vitals reviewed.  Constitutional:      General: He is not in acute distress.    Appearance: Normal appearance. He is not toxic-appearing or diaphoretic.  HENT:     Mouth/Throat:     Mouth: Mucous membranes are moist.  Eyes:     General: No scleral icterus.    Conjunctiva/sclera: Conjunctivae normal.  Cardiovascular:     Rate and Rhythm: Normal rate and regular rhythm.     Heart sounds: No murmur heard.    No friction rub. No gallop.  Pulmonary:     Effort: Pulmonary effort is normal.     Breath sounds: No stridor. No wheezing, rhonchi or rales.  Abdominal:     General: Abdomen is flat.     Palpations: There is no mass.     Tenderness: There is no abdominal tenderness. There is no guarding.     Hernia: No hernia is present.  Genitourinary:    Prostate: Enlarged. Not tender and no nodules present.     Rectum: Normal. Guaiac result negative. No mass, tenderness, anal fissure, external hemorrhoid or internal hemorrhoid. Normal anal tone.  Musculoskeletal:     Cervical back: Neck supple.     Right lower leg: No edema.     Left lower leg: No edema.  Lymphadenopathy:     Cervical: No cervical adenopathy.  Skin:    Coloration: Skin is not jaundiced or pale.     Findings: Bruising present. No lesion or rash.  Neurological:     Mental Status: He is alert. Mental status is at baseline.  Psychiatric:        Mood and Affect: Mood normal.        Behavior: Behavior normal.        Thought Content: Thought content normal.        Judgment: Judgment normal.    Lab Results  Component Value Date   WBC 5.9 04/11/2024    HGB 15.9 04/11/2024   HCT 46.7 04/11/2024   PLT 166.0 04/11/2024   GLUCOSE 105 (H) 02/08/2024   CHOL 182 04/11/2024   TRIG 63.0 04/11/2024   HDL 106.10 04/11/2024   LDLDIRECT 101.0 12/26/2018   LDLCALC 64 04/11/2024   ALT 25 02/08/2024   AST 25 02/08/2024   NA 140 02/08/2024   K 4.0 02/08/2024   CL 106 02/08/2024   CREATININE 0.95 02/08/2024   BUN 19 02/08/2024   CO2 29 02/08/2024   TSH 0.96 04/11/2024   PSA 4.5 12/15/2023   INR 1.0 04/11/2024   HGBA1C 5.0 04/11/2024  MICROALBUR 2.6 (H) 04/11/2024    NM PET DOTATATE SKULL BASE TO MID THIGH Result Date: 04/02/2024 CLINICAL DATA:  Metastatic neuroendocrine tumor. Primary any T of the small bowel. EXAM: NUCLEAR MEDICINE PET SKULL BASE TO THIGH TECHNIQUE: 4.1 mCi copper  64 DOTATATE was injected intravenously. Full-ring PET imaging was performed from the skull base to thigh after the radiotracer. CT data was obtained and used for attenuation correction and anatomic localization. COMPARISON:  None Available. FINDINGS: NECK No radiotracer activity in neck lymph nodes. Incidental CT findings: None CHEST No radiotracer accumulation within mediastinal or hilar lymph nodes. No suspicious pulmonary nodules on the CT scan. Incidental CT finding:None ABDOMEN/PELVIS Rounded mass in the RIGHT central mesentery measuring 4.2 cm intense radiotracer activity with SUV max equal 39. No additional mesenteric implants. No abnormal radiotracer activity associated with the bowel. Patient status post partial small bowel resection. No abnormal radiotracer activity in liver. Physiologic activity noted in the liver, spleen, adrenal glands and kidneys. Incidental CT findings:Uniform low-attenuation liver consistent hepatic steatosis. SKELETON No focal activity to suggest skeletal metastasis. Incidental CT findings:None IMPRESSION: 1. Solitary RIGHT lower quadrant mesenteric mass with intense radiotracer activity consistent with neuroendocrine tumor metastasis. 2. No  evidence of bowel metastasis or residual bowel disease. Post partial small bowel resection. 3. No evidence of liver metastasis. 4. Hepatic steatosis. Electronically Signed   By: Deboraha Fallow M.D.   On: 04/02/2024 15:27    Assessment & Plan:  Other iron deficiency anemia -     IBC + Ferritin; Future -     CBC with Differential/Platelet; Future  Type II diabetes mellitus with manifestations (HCC) -     Hemoglobin A1c; Future -     Urinalysis, Routine w reflex microscopic; Future -     Microalbumin / creatinine urine ratio; Future -     HM Diabetes Foot Exam -     Ambulatory referral to Ophthalmology -     Tirzepatide ; Inject 7.5 mg into the skin once a week.  Dispense: 6 mL; Refill: 1  Thiamine  deficiency -     Vitamin B1; Future  Inflammatory and toxic neuropathy (HCC) -     Gabapentin ; Take 1 capsule (300 mg total) by mouth 3 (three) times daily. Schedule an appointment for further refills  Dispense: 270 capsule; Refill: 0  Hyperlipidemia with target LDL less than 130 -     Lipid panel; Future -     TSH; Future  Essential hypertension -     TSH; Future  Abnormal bruising -     Protime-INR; Future -     APTT; Future  Encounter for general adult medical examination with abnormal findings  Severe episode of recurrent major depressive disorder, without psychotic features (HCC) -     Desvenlafaxine  Succinate ER; Take 1 tablet (50 mg total) by mouth daily.  Dispense: 90 tablet; Refill: 0 -     traZODone  HCl; Take 1 tablet (150 mg total) by mouth at bedtime. for sleep  Dispense: 90 tablet; Refill: 0  GAD (generalized anxiety disorder) -     busPIRone  HCl; Take 1 tablet (5 mg total) by mouth 3 (three) times daily.  Dispense: 270 tablet; Refill: 0 -     traZODone  HCl; Take 1 tablet (150 mg total) by mouth at bedtime. for sleep  Dispense: 90 tablet; Refill: 0  Restless leg syndrome, familial -     rOPINIRole  HCl ER; Take 1 tablet (4 mg total) by mouth at bedtime.  Dispense: 90  tablet; Refill: 0  Follow-up: Return in about 6 months (around 10/12/2024).  Sandra Crouch, MD

## 2024-04-15 LAB — VITAMIN B1: Vitamin B1 (Thiamine): 73 nmol/L — ABNORMAL HIGH (ref 8–30)

## 2024-04-17 DIAGNOSIS — M542 Cervicalgia: Secondary | ICD-10-CM | POA: Diagnosis not present

## 2024-04-17 DIAGNOSIS — M545 Low back pain, unspecified: Secondary | ICD-10-CM | POA: Diagnosis not present

## 2024-04-19 NOTE — Progress Notes (Signed)
   Lee Lee [id number]  DATE OF ENCOUNTER: 04/19/2024 Interval History:   He is nearly 3 months s/p small bowel resection and biopsy of large tethering mesenteric mass. His hospitalization was notable only for transient anastomotic bleeding but otherwise his post-op course was uneventful. I was able to discuss with him and his wife the pathology results by phone in early March and he was evaluated by oncology/ case discussed at GI tumor board on 3/12. Consensus was to closely monitor him, with labs and baseline dotatate PET scan- results below- and follow up in 6 months.  He has been doing well.  He reports that his appetite is somewhat low, but denies abdominal pain or obstructive symptoms.  Of note he is on Mounjaro .  Physical Examination:   There were no vitals filed for this visit.  Alert, well-appearing Unlabored respirations Abdomen is soft, nontender.  Incisions healing well.  No hernia or other complicating feature.  August 2016: mesenteric mass was present, measured 3.0 x 1.9 cm 3.0 x 1.9cm  January 2025: enhancing nodules in the right hemiabdominal mesentery associated with rounding enhancing foci within adjacent loops of right lower quadrant small bowel with a differential including carcinoid tumor or GIST... Additional findings included hepatic steatosis and a small hepatic cyst, mildly enlarged prostate, aortic atherosclerosis, coronary artery calcifications, and a small nonobstructing left kidney stone. 1 enhancing nodule in the right hemiabdominal central mesentery measures 3.8 x 2.8 cm   Preop- CA19-9: 13 CEA: 2.3  01/27/24: Diagnostic laparoscopy Laparoscopic assisted small bowel resection Biopsy of mesenteric mass  Path: A. SMALL BOWEL, RESECTION:     Well-differentiated neuroendocrine tumor, WHO grade 1.     Tumor site: Small intestine, not otherwise specified.     Tumor size: Multiple, range from 0.6 - 1.5 cm in maximal dimension.     Tumor invades visceral  peritoneum / serosa.     Lymphovascular invasion identified.     Perineural invasion identified.     Surgical margins of resection are negative for tumor.     Twelve out of fourteen lymph nodes, positive for neuroendocrine tumor (12/14).     See oncology table.  B. MESENTERY MASS, BIOPSY:     Benign fibrous tissue with chronic inflammation.     See comment.  Chromogranin A (3/12) 137.6 (upper limit of normal 101.8)  PET (04/02/24) IMPRESSION: 1. Solitary RIGHT lower quadrant mesenteric mass with intense radiotracer activity consistent with neuroendocrine tumor metastasis. 2. No evidence of bowel metastasis or residual bowel disease. Post partial small bowel resection. 3. No evidence of liver metastasis. 4. Hepatic steatosis.   Assessment and Plan:   Recovering appropriately.  Discussed expectations for ongoing recovery, activity limitations if applicable, and reasons to call. -His wife was present via FaceTime.  We again went over the intraoperative findings and that the mesenteric mass is in fact residual cancer, but expect that this is very slow-growing given how long this has been present .  We discussed that removing this would almost certainly subject him to at least short-bowel syndrome and significantly decreased quality/quantity of life given proximity to large mesenteric vasculature.  Discussed signs and symptoms to monitor for.  I will be happy to see him back if needed. -Follow up as scheduled with oncology in September   Mitzie Freund, MD FACS

## 2024-04-24 DIAGNOSIS — M542 Cervicalgia: Secondary | ICD-10-CM | POA: Diagnosis not present

## 2024-04-24 DIAGNOSIS — M545 Low back pain, unspecified: Secondary | ICD-10-CM | POA: Diagnosis not present

## 2024-05-08 DIAGNOSIS — M545 Low back pain, unspecified: Secondary | ICD-10-CM | POA: Diagnosis not present

## 2024-05-08 DIAGNOSIS — M542 Cervicalgia: Secondary | ICD-10-CM | POA: Diagnosis not present

## 2024-05-15 DIAGNOSIS — M542 Cervicalgia: Secondary | ICD-10-CM | POA: Diagnosis not present

## 2024-05-15 DIAGNOSIS — M545 Low back pain, unspecified: Secondary | ICD-10-CM | POA: Diagnosis not present

## 2024-05-22 DIAGNOSIS — M545 Low back pain, unspecified: Secondary | ICD-10-CM | POA: Diagnosis not present

## 2024-05-22 DIAGNOSIS — M542 Cervicalgia: Secondary | ICD-10-CM | POA: Diagnosis not present

## 2024-06-12 DIAGNOSIS — M545 Low back pain, unspecified: Secondary | ICD-10-CM | POA: Diagnosis not present

## 2024-06-12 DIAGNOSIS — M542 Cervicalgia: Secondary | ICD-10-CM | POA: Diagnosis not present

## 2024-06-26 DIAGNOSIS — M542 Cervicalgia: Secondary | ICD-10-CM | POA: Diagnosis not present

## 2024-06-26 DIAGNOSIS — M545 Low back pain, unspecified: Secondary | ICD-10-CM | POA: Diagnosis not present

## 2024-06-27 ENCOUNTER — Other Ambulatory Visit: Payer: Self-pay

## 2024-06-27 ENCOUNTER — Emergency Department (HOSPITAL_COMMUNITY)

## 2024-06-27 ENCOUNTER — Emergency Department (HOSPITAL_COMMUNITY)
Admission: EM | Admit: 2024-06-27 | Discharge: 2024-06-27 | Discharge status: Against Medical Advice | Attending: Emergency Medicine | Admitting: Emergency Medicine

## 2024-06-27 ENCOUNTER — Encounter (HOSPITAL_COMMUNITY): Payer: Self-pay

## 2024-06-27 DIAGNOSIS — Z5329 Procedure and treatment not carried out because of patient's decision for other reasons: Secondary | ICD-10-CM | POA: Diagnosis not present

## 2024-06-27 DIAGNOSIS — R42 Dizziness and giddiness: Secondary | ICD-10-CM | POA: Diagnosis not present

## 2024-06-27 DIAGNOSIS — I499 Cardiac arrhythmia, unspecified: Secondary | ICD-10-CM | POA: Diagnosis not present

## 2024-06-27 DIAGNOSIS — R0989 Other specified symptoms and signs involving the circulatory and respiratory systems: Secondary | ICD-10-CM | POA: Diagnosis not present

## 2024-06-27 DIAGNOSIS — I4891 Unspecified atrial fibrillation: Secondary | ICD-10-CM | POA: Diagnosis not present

## 2024-06-27 DIAGNOSIS — R000 Tachycardia, unspecified: Secondary | ICD-10-CM | POA: Diagnosis not present

## 2024-06-27 DIAGNOSIS — R55 Syncope and collapse: Secondary | ICD-10-CM | POA: Insufficient documentation

## 2024-06-27 LAB — CBC WITH DIFFERENTIAL/PLATELET
Abs Immature Granulocytes: 0.03 K/uL (ref 0.00–0.07)
Basophils Absolute: 0 K/uL (ref 0.0–0.1)
Basophils Relative: 0 %
Eosinophils Absolute: 0.1 K/uL (ref 0.0–0.5)
Eosinophils Relative: 1 %
HCT: 38.7 % — ABNORMAL LOW (ref 39.0–52.0)
Hemoglobin: 13.2 g/dL (ref 13.0–17.0)
Immature Granulocytes: 1 %
Lymphocytes Relative: 11 %
Lymphs Abs: 0.7 K/uL (ref 0.7–4.0)
MCH: 36.3 pg — ABNORMAL HIGH (ref 26.0–34.0)
MCHC: 34.1 g/dL (ref 30.0–36.0)
MCV: 106.3 fL — ABNORMAL HIGH (ref 80.0–100.0)
Monocytes Absolute: 0.6 K/uL (ref 0.1–1.0)
Monocytes Relative: 9 %
Neutro Abs: 5.3 K/uL (ref 1.7–7.7)
Neutrophils Relative %: 78 %
Platelets: 109 K/uL — ABNORMAL LOW (ref 150–400)
RBC: 3.64 MIL/uL — ABNORMAL LOW (ref 4.22–5.81)
RDW: 13.3 % (ref 11.5–15.5)
WBC: 6.7 K/uL (ref 4.0–10.5)
nRBC: 0 % (ref 0.0–0.2)

## 2024-06-27 LAB — MAGNESIUM: Magnesium: 1.7 mg/dL (ref 1.7–2.4)

## 2024-06-27 LAB — TROPONIN I (HIGH SENSITIVITY): Troponin I (High Sensitivity): 12 ng/L (ref <18)

## 2024-06-27 LAB — COMPREHENSIVE METABOLIC PANEL WITH GFR
ALT: 57 U/L — ABNORMAL HIGH (ref 0–44)
AST: 79 U/L — ABNORMAL HIGH (ref 15–41)
Albumin: 3.2 g/dL — ABNORMAL LOW (ref 3.5–5.0)
Alkaline Phosphatase: 50 U/L (ref 38–126)
Anion gap: 10 (ref 5–15)
BUN: 15 mg/dL (ref 8–23)
CO2: 20 mmol/L — ABNORMAL LOW (ref 22–32)
Calcium: 8.5 mg/dL — ABNORMAL LOW (ref 8.9–10.3)
Chloride: 110 mmol/L (ref 98–111)
Creatinine, Ser: 0.79 mg/dL (ref 0.61–1.24)
GFR, Estimated: >60 mL/min (ref >60)
Glucose, Bld: 87 mg/dL (ref 70–99)
Potassium: 3.4 mmol/L — ABNORMAL LOW (ref 3.5–5.1)
Sodium: 140 mmol/L (ref 135–145)
Total Bilirubin: 0.5 mg/dL (ref 0.0–1.2)
Total Protein: 6.1 g/dL — ABNORMAL LOW (ref 6.5–8.1)

## 2024-06-27 MED ORDER — SODIUM CHLORIDE 0.9 % IV BOLUS
1000.0000 mL | Freq: Once | INTRAVENOUS | Status: AC
  Administered 2024-06-27: 1000 mL via INTRAVENOUS

## 2024-06-27 NOTE — ED Triage Notes (Signed)
 Pt bib [facility] from work for near syncope. EMS reports pt in afib rvr 130-170, hx of a-flutter. 400mg  NS. Converted to NSR at 80bpm.

## 2024-06-27 NOTE — ED Provider Notes (Signed)
 [facility] Provider Note   CSN: [id number] Arrival date & time: 06/27/24  9041     Patient presents with: Near Syncope   Lee Lee is a 73 y.o. male.  With a history of atrial flutter, thoracic aortic aneurysm, SVT and remote history of alcohol use who presents to the ED after near syncopal episode.  Patient reports feeling well yesterday.  He was able to golf 18 holes with a cart.  He played well and shot an 81.  He suggests that maybe he did not drink enough water .  Does have a remote history of alcohol use has been sober and going to [employer]  Today he was at an AA meeting and stood up from a seated position then had an episode of near syncope.  He went to sit back down but missed the chair.  No associated trauma.  EMS was called and noted to be hypotensive with systolic blood pressure in the 70s and in atrial fibrillation with RVR.  Improvement in blood pressure and heart rate after about 500 cc IV fluid.  Reestablished normal sinus rhythm.  Patient reports feeling well now.  He has no chest pain shortness of breath nausea vomiting or other complaints at this time.    Near Syncope       Prior to Admission medications   Medication Sig Start Date End Date Taking? Authorizing Provider  b complex vitamins capsule Take 1 capsule by mouth daily. 05/10/23   Joshua Debby CROME, MD  busPIRone  (BUSPAR ) 5 MG tablet Take 1 tablet (5 mg total) by mouth 3 (three) times daily. 04/11/24   Joshua Debby CROME, MD  desvenlafaxine  (PRISTIQ ) 50 MG 24 hr tablet Take 1 tablet (50 mg total) by mouth daily. 04/11/24   Joshua Debby CROME, MD  gabapentin  (NEURONTIN ) 300 MG capsule Take 1 capsule (300 mg total) by mouth 3 (three) times daily. Schedule an appointment for further refills 04/11/24   Joshua Debby CROME, MD  losartan  (COZAAR ) 25 MG tablet Take 1 tablet (25 mg total) by mouth every evening. 10/14/23   Ladona Heinz, MD  rOPINIRole  (REQUIP  XL) 4 MG 24 hr tablet  Take 1 tablet (4 mg total) by mouth at bedtime. 04/11/24   Joshua Debby CROME, MD  tirzepatide  (MOUNJARO ) 7.5 MG/0.5ML Pen Inject 7.5 mg into the skin once a week. 04/11/24   Joshua Debby CROME, MD  traZODone  (DESYREL ) 150 MG tablet Take 1 tablet (150 mg total) by mouth at bedtime. for sleep 04/11/24   Joshua Debby CROME, MD    Allergies: Patient has no known allergies.    Review of Systems  Cardiovascular:  Positive for near-syncope.    Updated Vital Signs BP 138/76   Pulse 72   Resp (!) 22   Ht 5' 11 (1.803 m)   Wt 89.4 kg   SpO2 100%   BMI 27.48 kg/m   Physical Exam Vitals and nursing note reviewed.  HENT:     Head: Normocephalic and atraumatic.  Eyes:     Pupils: Pupils are equal, round, and reactive to light.  Cardiovascular:     Rate and Rhythm: Normal rate and regular rhythm.  Pulmonary:     Effort: Pulmonary effort is normal.     Breath sounds: Normal breath sounds.  Abdominal:     Palpations: Abdomen is soft.     Tenderness: There is no abdominal tenderness.  Skin:    General: Skin is warm and dry.  Neurological:  General: No focal deficit present.     Mental Status: He is alert.     Sensory: No sensory deficit.     Motor: No weakness.  Psychiatric:        Mood and Affect: Mood normal.     (all labs ordered are listed, but only abnormal results are displayed) Labs Reviewed  COMPREHENSIVE METABOLIC PANEL WITH GFR - Abnormal; Notable for the following components:      Result Value   Potassium 3.4 (*)    CO2 20 (*)    Calcium  8.5 (*)    Total Protein 6.1 (*)    Albumin  3.2 (*)    AST 79 (*)    ALT 57 (*)    All other components within normal limits  CBC WITH DIFFERENTIAL/PLATELET - Abnormal; Notable for the following components:   RBC 3.64 (*)    HCT 38.7 (*)    MCV 106.3 (*)    MCH 36.3 (*)    Platelets 109 (*)    All other components within normal limits  MAGNESIUM   TROPONIN I (HIGH SENSITIVITY)  TROPONIN I (HIGH SENSITIVITY)    EKG: EKG  Interpretation Date/Time:  Wednesday June 27 2024 10:03:51 EDT Ventricular Rate:  80 PR Interval:  179 QRS Duration:  100 QT Interval:  389 QTC Calculation: 449 R Axis:   31  Text Interpretation: Sinus rhythm RSR' in V1 or V2, right VCD or RVH Confirmed by Pamella Sharper [phone]) on 06/27/2024 1:37:45 PM  Radiology: DG Chest Portable 1 View Result Date: 06/27/2024 CLINICAL DATA:  ?HF EXAM: PORTABLE CHEST 1 VIEW COMPARISON:  09/27/2021. FINDINGS: Low lung volume. Bilateral lung fields are clear. Bilateral costophrenic angles are clear. Normal cardio-mediastinal silhouette. No acute osseous abnormalities. The soft tissues are within normal limits. IMPRESSION: No active disease. Electronically Signed   By: Ree Molt M.D.   On: 06/27/2024 10:27     Procedures   Medications Ordered in the ED  sodium chloride  0.9 % bolus 1,000 mL (0 mLs Intravenous Stopped 06/27/24 1119)    Clinical Course as of 06/27/24 1338  Wed Jun 27, 2024  1336 Initial laboratory workup notable for transaminitis macrocytosis.  High-sensitivity troponin normal.  Patient remained normotensive here with normal sinus rhythm on monitor.  He told us  that he had a doctor's appointment to keep at 2 PM and left prior to completion of his medical workup.  Unable to to obtain delta troponin.  He recognized the risk of leaving prior to completion of workup [MP]    Clinical Course User Index [MP] Pamella Sharper LABOR, DO                                 Medical Decision Making 73 year old male with history as above presents to the ED after near syncopal episode.  Noted to be in A-fib with RVR hypotensive with EMS.  Normotensive after IV fluids.  Normal sinus rhythm on monitor here.  No chest pain.  No associated trauma with near syncope.  Suspect this was vasovagal versus orthostatic hypotension.  Dehydration may be playing a role as he did not drink enough water  while playing golf yesterday.  Appears well on my exam.  No pulse  deficit chest pain or back pain.  Low suspicion for acute aortic pathology.  Will provide another liter of IV fluids.  Will obtain cardiac workup including ECG and high-sensitivity troponin along with laboratory workup to look for anemia  electrolyte balance renal dysfunction.  Will continue to monitor on telemetry.  Amount and/or Complexity of Data Reviewed Labs: ordered. Radiology: ordered.        Final diagnoses:  Near syncope    ED Discharge Orders     None          Pamella Ozell LABOR, DO 06/27/24 1338

## 2024-06-27 NOTE — ED Notes (Signed)
 Patient adamant about leaving, advised patient it would be AMA. Risks explained, AMA form signed.

## 2024-06-29 ENCOUNTER — Inpatient Hospital Stay: Admitting: Family Medicine

## 2024-07-09 ENCOUNTER — Other Ambulatory Visit: Payer: Self-pay | Admitting: Internal Medicine

## 2024-07-09 DIAGNOSIS — F332 Major depressive disorder, recurrent severe without psychotic features: Secondary | ICD-10-CM

## 2024-07-09 DIAGNOSIS — F411 Generalized anxiety disorder: Secondary | ICD-10-CM

## 2024-07-10 DIAGNOSIS — M542 Cervicalgia: Secondary | ICD-10-CM | POA: Diagnosis not present

## 2024-07-10 DIAGNOSIS — M545 Low back pain, unspecified: Secondary | ICD-10-CM | POA: Diagnosis not present

## 2024-07-24 DIAGNOSIS — M542 Cervicalgia: Secondary | ICD-10-CM | POA: Diagnosis not present

## 2024-07-24 DIAGNOSIS — M545 Low back pain, unspecified: Secondary | ICD-10-CM | POA: Diagnosis not present

## 2024-07-30 NOTE — Progress Notes (Deleted)
 Cardiology Office Note:  .   Date:  07/30/2024  ID:  Lee Lee, DOB Apr 06, 1951, MRN [id number] PCP: Joshua Debby CROME, MD  [facility] Providers Cardiologist:  None { Click to update primary MD,subspecialty MD or APP then REFRESH:1}  History of Present Illness: .   Lee Lee is a 73 y.o.  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  Coronary CT angiogram at that time had revealed no significant CAD with a coronary calcium  score of 0.  Past medical history significant for hypertension, diabetes mellitus, hypercholesterolemia, mild ascending aortic root dilatation, depression since he retired.   In the interim he was diagnosed with metastatic neuroendocrine tumor and underwent small bowel resection of the mesenteric mass on 01/31/2024 following which he underwent PET scan in April 2024 revealing a 4 cm tumor that could not be removed due to proximity to major vessels however tumor was found to be benign.  His last myocardial perfusion scan on 07/01/2021 was nonischemic with normal LVEF, low risk and echocardiogram on 08/11/2022 revealing normal LV systolic function, EF 55 to 60% with mild left atrial enlargement.  He was seen in the emergency room on 06/27/2024 for near syncope, he was at the AA meeting and stood up from a seated position and felt markedly dizzy and felt like he was 1 pass out missed the chair and fell to the ground.  EMS found him to be hypotensive with systolic blood pressure in 70s and in A-fib with RVR.  Returned back to sinus rhythm with IV hydration and was evaluated in the emergency room and discharged home after patient felt he needs to go home as he was feeling well.   Cardiac Studies relevent.         LONG TERM MONITOR (3-14 DAYS) 12/17/2022   Narrative Patch Wear Time:   3 days and 12 hours (2024-01-12T18:34:41-0500 to 2024-01-16T06:42:48-0500)   Rhythm  Sinus rhythm  Rate 58 to 116 bpm   Average HR 79 bpm   Rare PVC, frequent  PAC (5%_   SHort bursts of SVT, fastest for 6 beats at 207 bpm, longest 7 beats at 139 bpm.  One 4 beat NSVT at 128 bpm Triggered events correlated with SR with PACs SR with PAC and PVC.    Discussed the use of AI scribe software for clinical note transcription with the patient, who gave verbal consent to proceed.  History of Present Illness    Labs   Lab Results  Component Value Date   CHOL 182 04/11/2024   HDL 106.10 04/11/2024   LDLCALC 64 04/11/2024   LDLDIRECT 101.0 12/26/2018   TRIG 63.0 04/11/2024   CHOLHDL 2 04/11/2024   No results found for: LIPOA  Recent Labs    01/29/24 0455 02/08/24 1515 06/27/24 1028  NA 137 140 140  K 4.0 4.0 3.4*  CL 105 106 110  CO2 28 29 20*  GLUCOSE 116* 105* 87  BUN 21 19 15   CREATININE 0.76 0.95 0.79  CALCIUM  8.1* 9.2 8.5*  GFRNONAA >60 >60 >60    Lab Results  Component Value Date   ALT 57 (H) 06/27/2024   AST 79 (H) 06/27/2024   ALKPHOS 50 06/27/2024   BILITOT 0.5 06/27/2024      Latest Ref Rng & Units 06/27/2024   10:28 AM 04/11/2024  10:25 AM 02/08/2024    3:15 PM  CBC  WBC 4.0 - 10.5 K/uL 6.7  5.9  7.8   Hemoglobin 13.0 - 17.0 g/dL 86.7  84.0  89.0   Hematocrit 39.0 - 52.0 % 38.7  46.7  32.5   Platelets 150 - 400 K/uL 109  166.0  243    Lab Results  Component Value Date   HGBA1C 5.0 04/11/2024    Lab Results  Component Value Date   TSH 0.96 04/11/2024     ROS  ***ROS Physical Exam:   VS:  There were no vitals taken for this visit.   Wt Readings from Last 3 Encounters:  06/27/24 197 lb (89.4 kg)  04/11/24 219 lb 6.4 oz (99.5 kg)  02/08/24 214 lb 14.4 oz (97.5 kg)    BP Readings from Last 3 Encounters:  06/27/24 138/76  04/11/24 (!) 142/86  02/08/24 120/66   ***Physical Exam EKG:         ASSESSMENT AND PLAN: .    No diagnosis  found. Assessment & Plan    Follow up: ***  Signed,  Gordy Bergamo, MD, [facility] 07/30/2024, 6:54 PM [facility] [address] [place], [place] [address] Phone: [phone]. Fax:  [phone]

## 2024-07-31 ENCOUNTER — Ambulatory Visit: Attending: Cardiology | Admitting: Cardiology

## 2024-08-03 DIAGNOSIS — M79641 Pain in right hand: Secondary | ICD-10-CM | POA: Diagnosis not present

## 2024-08-03 DIAGNOSIS — S63634A Sprain of interphalangeal joint of right ring finger, initial encounter: Secondary | ICD-10-CM | POA: Diagnosis not present

## 2024-08-03 DIAGNOSIS — S63656A Sprain of metacarpophalangeal joint of right little finger, initial encounter: Secondary | ICD-10-CM | POA: Diagnosis not present

## 2024-08-03 NOTE — Progress Notes (Signed)
 Orthopaedic Surgery Hand and Upper Extremity History and Physical Examination  CC: Right ring and small finger injury  HPI 08/03/2024: Lee Lee is a 73 y.o. male that is right-hand dominant presents for evaluation of right ring and small finger injury that occurred 3 weeks ago when he tripped over a water  hose while watering his plants at night.  At that time he fell down 8 stairs onto his hand..  He states that his fingers were sideways and he popped them back into place.  He has continued to have swelling and discomfort since the injury.  He did not seek any treatment after the injury.   Problem List:  Problem List[1]  Past Medical History: Medical History[2]   Medications: Current Rx ordered in [facility][3]  Allergies: Allergies as of 08/03/2024  . (No Known Allergies)    Past Surgical History: Surgical History[4]   Social History: Social History   Occupational History  . Not on file  Tobacco Use  . Smoking status: Never  . Smokeless tobacco: Never  Substance and Sexual Activity  . Alcohol use: Yes    Alcohol/week: 2.0 standard drinks of alcohol    Types: 2 Drinks containing 0.5 oz of alcohol per week  . Drug use: Not Currently  . Sexual activity: Not on file     Family History: Family History[5] Otherwise, no relevant orthopaedic family history  ROS: Review of Systems: All systems reviewed and are negative except that mentioned in HPI  Work/Sport/Hobbies: See HPI  Physical Examination: Vitals:   08/03/24 0831  Temp: 98.1 F (36.7 C)   Constitutional: Awake, alert.  WN/WD Appearance: healthy, no acute distress, well-groomed Affect: Normal HEENT: EOMI, mucous membranes moist CV: RRR Pulm: breathing comfortably  Right upper Extremity / Hand Overlying skin is warm dry and intact.  There is no signs of rash irritation or infection.  There is no obvious deformity.  No obvious nail deformities.  Capillary refill is brisk and skin turgor is  appropriate.  Neurovascularly intact.  Can make a composite fist when compared bilaterally.    Swelling of the ring and small fingers particularly at the PIP joints.  No laxity with radial or ulnar deviation at the MCP, PIP, DIP joints of the ring and small fingers.  Sensation: intact to light touch in median, radial and ulnar nerve distributions  Motor: intact in AIN, PIN, and ulnar nerve distributions Vascular: Fingers warm and well perfused, palpable radial pulse   Imaging: I have personally reviewed the following studies: Right hand x-rays show no fractures, dislocations, acute osseous abnormalities, radiopaque foreign body.  Assessment/Plan: 1.  Right ring finger PIP joint sprain and right small finger MCP joint sprain: Acute but improving - Discussed the nature of sprain injuries.  Discussed that since his injury was 3 weeks ago no splinting is needed at this time.  I recommend occupational therapy although he is not interested at this time.  Recommended ice and over-the-counter anti-inflammatories as needed for discomfort and swelling.  He will let us  know if he changes his mind about attending therapy.  Follow-up as needed.  Betty Anton, PA-C [facility] Department of Orthopaedic Surgery 08/03/2024 8:35 AM      [1] Patient Active Problem List Diagnosis  . Bilateral carpal tunnel syndrome  [2] Past Medical History: Diagnosis Date  . Alcohol abuse   . Ascending aortic aneurysm   . Bipolar 2 disorder    (CMD)   . GAD (generalized anxiety disorder)   .  Hypertension   . MDD (major depressive disorder)   . Steatohepatitis   . SVT (supraventricular tachycardia) (CMD)   . Thoracic aortic aneurysm   [3] Meds Ordered in [facility]  Medication Sig Dispense Refill  . b complex vitamins cap capsule Take 1 capsule by mouth Once Daily.    . busPIRone  (BUSPAR ) 5 mg tablet Take 5 mg by mouth 3 (three) times a day.    . desvenlafaxine  (PRISTIQ ) 50 mg 24 hr  tablet Take 50 mg by mouth Once Daily.    . gabapentin  (NEURONTIN ) 300 mg capsule Take 300 mg by mouth nightly.    . losartan  (COZAAR ) 25 mg tablet Take 25 mg by mouth Once Daily.    . rOPINIRole  (REQUIP  XL) 4 mg 24 hr tablet Take 4 mg by mouth.    . tirzepatide  (Mounjaro ) 7.5 mg/0.5 mL pnij Inject 7.5 mg under the skin.    . traZODone  (DESYREL ) 150 mg tablet Take 150 mg by mouth nightly.    . acetaminophen  (TYLENOL ) 500 mg tablet      No current Epic-ordered facility-administered medications on file.  [4] Past Surgical History: Procedure Laterality Date  . KNEE SURGERY     Procedure: KNEE SURGERY  [5] Family History Problem Relation Name Age of Onset  . Heart disease Mother    . Hypertension Father    . Arthritis Father

## 2024-08-07 DIAGNOSIS — M542 Cervicalgia: Secondary | ICD-10-CM | POA: Diagnosis not present

## 2024-08-07 DIAGNOSIS — M545 Low back pain, unspecified: Secondary | ICD-10-CM | POA: Diagnosis not present

## 2024-08-08 ENCOUNTER — Other Ambulatory Visit: Payer: Self-pay

## 2024-08-08 DIAGNOSIS — C7A8 Other malignant neuroendocrine tumors: Secondary | ICD-10-CM

## 2024-08-08 DIAGNOSIS — D3A8 Other benign neuroendocrine tumors: Secondary | ICD-10-CM

## 2024-08-08 NOTE — Progress Notes (Signed)
 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  A CT scan done on December 16, 2023 demonstrated enhancing nodules in the right hemiabdominal mesentery associated with rounding enhancing foci within adjacent loops of right lower quadrant small bowel with a differential including carcinoid tumor or gist.  Additional findings on the CT included hepatic steatosis and a small hepatic cyst, mildly enlarged prostate, aortic atherosclerosis, coronary artery calcifications, and a small, nonobstructing left kidney stone.  The CT showed 1 enhancing nodule in the right Hemi abdominal central mesentery measuring 3.8 x 2.8 cm.  This was noted to be present on a CT scan in August 2016, previously measuring 3.0 x 1.9 cm.   On 01/27/2024, he did undergo laparoscopic assisted small bowel resection and biopsy of mesenteric mass.  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  Tumor invades visceral peritoneum/serosa.  Lymphovascular invasion was identified.  Surgical margins of resection are negative for tumor.  12 out of 14 lymph nodes positive for neuroendocrine tumor.  Pathology of mesentery mass shows benign fibrous tissue with chronic inflammation.  Pathology of masses from jejunum and ileum neuroendocrine tumor show multiple sites and multiple sizes.  He was labeled as multifocal histologic  type and grade, well-differentiated neuroendocrine tumor, WHO grade 1.  Mitotic rate <1 mitosis/2 mm.  Ki-67 <1%.  Tumor invades visceral peritoneum with lymphovascular invasion present.  All margins are negative for tumor.  His case with discussed this morning during multidisciplinary GI tumor board.  General consensus is to closely monitor the patient with labs and baseline dotatate PET scan. -04/02/2024 - PET/CT scan showed solitary right lower quadrant mesenteric mass that was hypermetabolic and consistent with neuroendocrine metastatic disease.  There is no evidence of bowel metastases or residual bowel disease.  There was no evidence of liver metastases.  He does have hepatic steatosis.  - 08/09/2024 -patient having increased symptoms of fatigue, decreased appetite, unintentional weight loss.  Platelet count dropping.  Chromogranin A has normalized.  Will get CT CAP for further evaluation.   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  Currently on close surveillance.  In 01/2024, chromogranin A level was elevated.  Today has improved to normal levels.  Patient with worsening symptoms of fatigue, decreased appetite, and unintentional weight loss.  Will get CT CAP for further evaluation of possible metastatic disease.  Unintentional weight loss Recommend high-calorie, high-protein diet.  May substitute meals with protein shakes.  Consider adding protein powder to palatable foods.  Light, weightbearing exercises to prevent muscle atrophy was recommended.  Thrombocytopenia Platelet count continues to drop.  Today is 83.  In 01/2024, Was 109.  Prior to that platelet count was normal.  This is likely due to hepatic steatosis noted on PET/CT.  For further evaluation with CT CAP.  Refer to GI as indicated.  Plan Labs reviewed. - CBC showing moderate thrombocytopenia. - CMP showing elevation of liver functions. -  Elevated ferritin at 470. Reviewed PET/CT from 04/02/2024 showing hypermetabolic mesenteric mass in the right lower quadrant of the abdomen.  Mass was consistent with metastatic disease.  Due to worsening, generalized symptoms, will get CT CAP for further evaluation. Labs and follow-up in 3 to 4 weeks to review.   The patient understands the plans discussed today and is in agreement with them.  He knows to contact our office if he develops concerns prior to his next appointment.  I provided 30 minutes of face-to-face time during this encounter and > 50% was spent counseling as documented under my assessment and plan.    Powell FORBES Lessen, NP  [facility] - A DEPT OF [facility] [address] [place] [address] Dept: [phone] Dept Fax: [phone]   Orders Placed This Encounter  Procedures   CT CHEST ABDOMEN PELVIS W CONTRAST    Wt: 203 / not diabetic/ no kidney problems or kidney diseases/ yes both kidneys No spinal stimulator/no body injector/no glucose or heart monitor/ no port No special needs/ Allergic to contrast NO Ins: HTA S/w patient epic  order/LM Pt needs to p/u contrast  pt aware of $75 no show fee    Standing Status:   Future    Number of Occurrences:   1    Expected Date:   08/23/2024    Expiration Date:   08/09/2025    If indicated for the ordered procedure, I authorize the administration of contrast media per Radiology protocol:   Yes    Does the patient have a contrast media/X-ray dye allergy?:   No    If indicated for the ordered procedure, I authorize the administration of oral contrast media per Radiology protocol:   Yes    Preferred imaging location?:   [facility]      CHIEF COMPLAINT:  CC: Primary neuroendocrine tumor of small intestine  Current Treatment:  close surveillance   INTERVAL HISTORY:  Lee Lee is here today for repeat clinical assessment.  His initial visit with  me was 02/08/2024.  He has since had dotatate PET scan.  This was on 04/02/2024.  The solitary right lower quadrant mesenteric mass was hypermetabolic and consistent with neuroendocrine metastatic disease.  There is no evidence of bowel metastases or residual bowel disease.  There was no evidence of liver metastases.  He does have hepatic steatosis.  His chromogranin a level from 02/08/2024 was elevated at 137.6.  This has significantly improved to 73.2.  Patient is reporting fatigue.  He has decreased oral intake.  Reports that symptoms have been present since his surgery though they have been more severe over the last 2 to 3 months.  He denies chest pain, chest pressure, or shortness of breath. He denies headaches or visual disturbances. He denies abdominal pain, nausea, vomiting, or changes in bowel or bladder habits.  He does report having large, formed stools despite decreased oral intake.  He denies fevers or chills. He denies pain. His weight has decreased 14 pounds over last 4 months.  I have reviewed the past medical history, past surgical history, social history and family history with the patient and they are unchanged from previous note.  ALLERGIES:  has no known allergies.  MEDICATIONS:  Current Outpatient Medications  Medication Sig Dispense Refill   b complex vitamins capsule Take 1 capsule by mouth daily. 90 capsule 1   busPIRone  (BUSPAR ) 5 MG tablet TAKE 1 TABLET BY  MOUTH 3 TIMES A DAY 270 tablet 0   desvenlafaxine  (PRISTIQ ) 50 MG 24 hr tablet TAKE 1 TABLET BY MOUTH DAILY 90 tablet 0   gabapentin  (NEURONTIN ) 300 MG capsule Take 1 capsule (300 mg total) by mouth 3 (three) times daily. Schedule an appointment for further refills 270 capsule 0   losartan  (COZAAR ) 25 MG tablet Take 1 tablet (25 mg total) by mouth every evening. 90 tablet 3   rOPINIRole  (REQUIP  XL) 4 MG 24 hr tablet Take 1 tablet (4 mg total) by mouth at bedtime. 90 tablet 0   tirzepatide  (MOUNJARO ) 7.5 MG/0.5ML Pen Inject 7.5  mg into the skin once a week. 6 mL 1   traZODone  (DESYREL ) 150 MG tablet Take 1 tablet (150 mg total) by mouth at bedtime. for sleep 90 tablet 0   No current facility-administered medications for this visit.    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 Solitary RIGHT lower quadrant mesenteric mass with intense radiotracer activity consistent with neuroendocrine tumor metastasis. 2. No evidence of bowel metastasis or residual bowel disease. Post partial small bowel resection. 3. No evidence of liver metastasis. 4. Hepatic steatosis.       REVIEW OF SYSTEMS:   Constitutional: Denies fevers or chills.  Has had fatigue and decreased appetite.  Has had a 14 pound weight loss since 03/2024. Eyes: Denies blurriness of vision Ears, nose, mouth, throat, and face: Denies mucositis or sore throat Respiratory: Denies cough, dyspnea or wheezes Cardiovascular: Denies palpitation, chest discomfort or lower extremity swelling Gastrointestinal:  Denies nausea, heartburn or change in bowel habits.  Skin: Denies abnormal skin rashes Lymphatics: Denies new lymphadenopathy or easy bruising Neurological:Denies numbness, tingling or new weaknesses Behavioral/Psych: Mood is stable, no new changes  All other systems were reviewed with the patient and are negative.   VITALS:   Today's Vitals   08/09/24 1056  BP: 116/72  Pulse: 79  Resp: 13   Temp: (!) 97.5 F (36.4 C)  TempSrc: Temporal  SpO2: 100%  Weight: 205 lb 14.4 oz (93.4 kg)   Body mass index is 28.72 kg/m.   Wt Readings from Last 3 Encounters:  08/09/24 205 lb 14.4 oz (93.4 kg)  06/27/24 197 lb (89.4 kg)  04/11/24 219 lb 6.4 oz (99.5 kg)    Body mass index is 28.72 kg/m.  Performance status (ECOG): 1 - Symptomatic but completely ambulatory  PHYSICAL EXAM:   GENERAL:alert, no distress and comfortable SKIN: skin color, texture, turgor are normal, no rashes or significant lesions EYES: normal, Conjunctiva are pink and non-injected, sclera clear OROPHARYNX:no exudate, no erythema and lips, buccal mucosa, and tongue normal  NECK: supple, thyroid  normal size, non-tender, without nodularity LYMPH:  no palpable lymphadenopathy in the cervical, axillary or inguinal LUNGS: clear to auscultation and percussion with normal breathing effort HEART: regular rate & rhythm and no murmurs and no lower extremity edema ABDOMEN:abdomen soft, non-tender and normal bowel sounds Musculoskeletal:no cyanosis of digits and no clubbing  NEURO:  alert & oriented x 3 with fluent speech, no focal motor/sensory deficits  LABORATORY DATA:  I have reviewed the data as listed    Component Value Date/Time   NA 142 08/09/2024 1029   NA 144 12/11/2020 0853   K 4.0 08/09/2024 1029   CL 105 08/09/2024 1029   CO2 30 08/09/2024 1029   GLUCOSE 74 08/09/2024 1029   BUN 11 08/09/2024 1029   BUN 14 12/11/2020 0853   CREATININE 0.75 08/09/2024 1029   CREATININE 0.93 06/18/2020 0928   CALCIUM  9.3 08/09/2024 1029   PROT 7.4 08/09/2024 1029   PROT 6.6 07/12/2022 0911   ALBUMIN  4.3 08/09/2024 1029   AST 81 (H) 08/09/2024 1029   ALT 52 (H) 08/09/2024 1029   ALKPHOS 82 08/09/2024 1029   BILITOT 0.6 08/09/2024 1029   GFRNONAA >60 08/09/2024 1029   GFRNONAA 85 06/18/2020 0928   GFRAA 106 12/11/2020 0853   GFRAA 98 06/18/2020 0928     Lab Results  Component Value Date   WBC 4.8  08/09/2024   NEUTROABS 3.1 08/09/2024   HGB 14.7 08/09/2024   HCT 42.3 08/09/2024   MCV 102.9 (H) 08/09/2024   PLT 92 (L) 08/09/2024    RADIOGRAPHIC STUDIES: CT CHEST ABDOMEN PELVIS W CONTRAST Result Date: 08/17/2024 CLINICAL DATA:  Metastatic disease evaluation. History of neuroendocrine tumor of small intestine with metastasis. * Tracking Code: BO * EXAM: CT CHEST, ABDOMEN, AND PELVIS WITH CONTRAST TECHNIQUE: Multidetector CT imaging of the chest, abdomen and pelvis was performed following the standard protocol during bolus administration of intravenous contrast. RADIATION DOSE REDUCTION: This exam was performed according to the departmental dose-optimization program which includes automated exposure control, adjustment of the mA and/or kV according to patient size and/or use of iterative reconstruction technique. CONTRAST:  100mL ISOVUE -300 IOPAMIDOL  (ISOVUE -300) INJECTION 61% COMPARISON:  PET-CT Apr 02, 2024 FINDINGS: CT CHEST FINDINGS Cardiovascular: Aortic atherosclerosis. Enlarged main pulmonary artery measuring 3.5 cm in diameter. Normal size heart. No significant pericardial effusion/thickening. Mediastinum/Nodes: No suspicious thyroid  nodule. No pathologically enlarged mediastinal, hilar or axillary lymph nodes. Mild symmetric esophageal wall thickening. Lungs/Pleura: A few scattered tiny pulmonary nodules for instance a right lower lobe peri-fissural pulmonary nodule measuring 3 mm on image 81/5 and a right upper lobe pulmonary nodule measuring 2 mm on image 41/5. Scattered atelectasis/scarring. Musculoskeletal: Patchy sclerosis in the left fifth rib on image 71/5 in the left lateral sixth rib on image 85/6, this is new from prior PET-CT. Remote left healed rib fractures.  Thoracic spondylosis. CT ABDOMEN PELVIS FINDINGS Hepatobiliary: Marked diffuse hepatic steatosis. No suspicious hepatic lesion identified. Gallbladder is distended without wall thickening or pericholecystic fluid. No biliary  ductal dilation. Pancreas: No pancreatic ductal dilation or evidence of acute inflammation. Spleen: No splenomegaly. Adrenals/Urinary Tract: No suspicious adrenal nodule/mass. Nonobstructive 6 mm left lower pole renal stone. No hydronephrosis. Too small to accurately characterize hypodense left lower pole renal lesion. Urinary bladder is unremarkable for degree of distension. Stomach/Bowel: Stomach is minimally distended limiting evaluation. No pathologic dilation of small or large bowel. Vascular/Lymphatic: Normal caliber abdominal aorta. Aortic atherosclerosis. Smooth IVC contours. No pathologically enlarged abdominal or pelvic lymph nodes. Reproductive: Dystrophic calcifications with heterogeneous enhancement of an enlarged prostate gland. Other: Mesenteric soft tissue mass measuring 4 x 3.1 cm on image 85/3, similar to prior when remeasured and was intensely radiotracer active on prior Dotatate PET-CT. Additional small adjacent soft tissue nodules measuring up to 9 mm on image 85/3 are also stable from prior. Similar mesenteric stranding.  Musculoskeletal: Bony ankylosis of the pubic symphysis. Partial bony ankylosis of the left SI joint. Lumbar spondylosis. IMPRESSION: 1. Stable mesenteric soft tissue mass and adjacent soft tissue nodules, which were intensely radiotracer active on prior Dotatate PET-CT, consistent with metastatic disease. 2. Patchy sclerosis in the left fifth and sixth ribs, new from prior PET-CT, at least somewhat suspicious for osseous metastatic disease. 3. A few scattered tiny pulmonary nodules measuring up to 3 mm, nonspecific, suggest attention on follow-up imaging. 4. Marked diffuse hepatic steatosis. 5. Nonobstructive 6 mm left lower pole renal stone. 6. Enlarged main pulmonary artery, which can be seen in the setting of pulmonary arterial hypertension. 7. Mild symmetric esophageal wall thickening, which may reflect esophagitis. 8. Aortic atherosclerosis. Aortic Atherosclerosis  (ICD10-I70.0). Electronically Signed   By: Reyes Holder M.D.   On: 08/17/2024 17:12

## 2024-08-08 NOTE — Assessment & Plan Note (Addendum)
 Small bowel mass found incidentally as part of a workup for urinary urgency with [facility].  A CT scan done on December 16, 2023 demonstrated enhancing nodules in the right hemiabdominal mesentery associated with rounding enhancing foci within adjacent loops of right lower quadrant small bowel with a differential including carcinoid tumor or gist.  Additional findings on the CT included hepatic steatosis and a small hepatic cyst, mildly enlarged prostate, aortic atherosclerosis, coronary artery calcifications, and a small, nonobstructing left kidney stone.  The CT showed 1 enhancing nodule in the right Hemi abdominal central mesentery measuring 3.8 x 2.8 cm.  This was noted to be present on a CT scan in August 2016, previously measuring 3.0 x 1.9 cm.   On 01/27/2024, he did undergo laparoscopic assisted small bowel resection and biopsy of mesenteric mass.  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  Tumor invades visceral peritoneum/serosa.  Lymphovascular invasion was identified.  Surgical margins of resection are negative for tumor.  12 out of 14 lymph nodes positive for neuroendocrine tumor.  Pathology of mesentery mass shows benign fibrous tissue with chronic inflammation.  Pathology of masses from jejunum and ileum neuroendocrine tumor show multiple sites and multiple sizes.  He was labeled as multifocal histologic type and grade, well-differentiated neuroendocrine tumor, WHO grade 1.  Mitotic rate <1 mitosis/2 mm.  Ki-67 <1%.  Tumor invades visceral peritoneum with lymphovascular invasion present.  All margins are negative for tumor.  His case with discussed this morning during multidisciplinary GI tumor board.  General consensus is to closely monitor the patient with labs and baseline dotatate PET scan. -04/02/2024 - PET/CT scan showed solitary right lower quadrant mesenteric mass that was hypermetabolic and consistent with neuroendocrine metastatic  disease.  There is no evidence of bowel metastases or residual bowel disease.  There was no evidence of liver metastases.  He does have hepatic steatosis.  - 08/09/2024 -patient having increased symptoms of fatigue, decreased appetite, unintentional weight loss.  Platelet count dropping.  Chromogranin A has normalized.  Will get CT CAP for further evaluation.

## 2024-08-09 ENCOUNTER — Inpatient Hospital Stay (HOSPITAL_BASED_OUTPATIENT_CLINIC_OR_DEPARTMENT_OTHER): Admitting: Nurse Practitioner

## 2024-08-09 ENCOUNTER — Inpatient Hospital Stay: Attending: Nurse Practitioner

## 2024-08-09 VITALS — BP 116/72 | HR 79 | Temp 97.5°F | Resp 13 | Wt 205.9 lb

## 2024-08-09 DIAGNOSIS — R634 Abnormal weight loss: Secondary | ICD-10-CM | POA: Insufficient documentation

## 2024-08-09 DIAGNOSIS — C7A8 Other malignant neuroendocrine tumors: Secondary | ICD-10-CM | POA: Insufficient documentation

## 2024-08-09 DIAGNOSIS — D696 Thrombocytopenia, unspecified: Secondary | ICD-10-CM | POA: Insufficient documentation

## 2024-08-09 DIAGNOSIS — C7B8 Other secondary neuroendocrine tumors: Secondary | ICD-10-CM | POA: Insufficient documentation

## 2024-08-09 DIAGNOSIS — D3A8 Other benign neuroendocrine tumors: Secondary | ICD-10-CM

## 2024-08-09 LAB — CMP (CANCER CENTER ONLY)
ALT: 52 U/L — ABNORMAL HIGH (ref 0–44)
AST: 81 U/L — ABNORMAL HIGH (ref 15–41)
Albumin: 4.3 g/dL (ref 3.5–5.0)
Alkaline Phosphatase: 82 U/L (ref 38–126)
Anion gap: 7 (ref 5–15)
BUN: 11 mg/dL (ref 8–23)
CO2: 30 mmol/L (ref 22–32)
Calcium: 9.3 mg/dL (ref 8.9–10.3)
Chloride: 105 mmol/L (ref 98–111)
Creatinine: 0.75 mg/dL (ref 0.61–1.24)
GFR, Estimated: >60 mL/min (ref >60)
Glucose, Bld: 74 mg/dL (ref 70–99)
Potassium: 4 mmol/L (ref 3.5–5.1)
Sodium: 142 mmol/L (ref 135–145)
Total Bilirubin: 0.6 mg/dL (ref 0.0–1.2)
Total Protein: 7.4 g/dL (ref 6.5–8.1)

## 2024-08-09 LAB — CBC WITH DIFFERENTIAL (CANCER CENTER ONLY)
Abs Immature Granulocytes: 0.01 K/uL (ref 0.00–0.07)
Basophils Absolute: 0 K/uL (ref 0.0–0.1)
Basophils Relative: 1 %
Eosinophils Absolute: 0.1 K/uL (ref 0.0–0.5)
Eosinophils Relative: 2 %
HCT: 42.3 % (ref 39.0–52.0)
Hemoglobin: 14.7 g/dL (ref 13.0–17.0)
Immature Granulocytes: 0 %
Lymphocytes Relative: 24 %
Lymphs Abs: 1.1 K/uL (ref 0.7–4.0)
MCH: 35.8 pg — ABNORMAL HIGH (ref 26.0–34.0)
MCHC: 34.8 g/dL (ref 30.0–36.0)
MCV: 102.9 fL — ABNORMAL HIGH (ref 80.0–100.0)
Monocytes Absolute: 0.4 K/uL (ref 0.1–1.0)
Monocytes Relative: 8 %
Neutro Abs: 3.1 K/uL (ref 1.7–7.7)
Neutrophils Relative %: 65 %
Platelet Count: 92 K/uL — ABNORMAL LOW (ref 150–400)
RBC: 4.11 MIL/uL — ABNORMAL LOW (ref 4.22–5.81)
RDW: 12.3 % (ref 11.5–15.5)
WBC Count: 4.8 K/uL (ref 4.0–10.5)
nRBC: 0 % (ref 0.0–0.2)

## 2024-08-09 LAB — FERRITIN: Ferritin: 470 ng/mL — ABNORMAL HIGH (ref 24–336)

## 2024-08-11 LAB — CHROMOGRANIN A: Chromogranin A (ng/mL): 73.2 ng/mL (ref 0.0–101.8)

## 2024-08-14 DIAGNOSIS — M542 Cervicalgia: Secondary | ICD-10-CM | POA: Diagnosis not present

## 2024-08-14 DIAGNOSIS — M545 Low back pain, unspecified: Secondary | ICD-10-CM | POA: Diagnosis not present

## 2024-08-15 ENCOUNTER — Ambulatory Visit
Admission: RE | Admit: 2024-08-15 | Discharge: 2024-08-15 | Disposition: A | Discharge status: Home / Self Care | Source: Ambulatory Visit | Attending: Nurse Practitioner | Admitting: Nurse Practitioner

## 2024-08-15 DIAGNOSIS — K760 Fatty (change of) liver, not elsewhere classified: Secondary | ICD-10-CM | POA: Diagnosis not present

## 2024-08-15 DIAGNOSIS — I700 Atherosclerosis of aorta: Secondary | ICD-10-CM | POA: Diagnosis not present

## 2024-08-15 DIAGNOSIS — C7A8 Other malignant neuroendocrine tumors: Secondary | ICD-10-CM

## 2024-08-15 MED ORDER — IOPAMIDOL (ISOVUE-300) INJECTION 61%
100.0000 mL | Freq: Once | INTRAVENOUS | Status: AC | PRN
  Administered 2024-08-15: 100 mL via INTRAVENOUS

## 2024-08-20 ENCOUNTER — Encounter: Payer: Self-pay | Admitting: Nurse Practitioner

## 2024-08-22 ENCOUNTER — Other Ambulatory Visit: Payer: Self-pay | Admitting: Internal Medicine

## 2024-08-22 ENCOUNTER — Telehealth: Payer: Self-pay | Admitting: Nurse Practitioner

## 2024-08-22 DIAGNOSIS — F332 Major depressive disorder, recurrent severe without psychotic features: Secondary | ICD-10-CM

## 2024-08-22 DIAGNOSIS — F411 Generalized anxiety disorder: Secondary | ICD-10-CM

## 2024-08-22 NOTE — Telephone Encounter (Signed)
 LVM regarding  reschedule appt to earlier  time and day for him per provider.

## 2024-08-28 NOTE — Progress Notes (Unsigned)
 Patient Care Team: Joshua Debby CROME, MD as PCP - General (Internal Medicine) Barbarann Oneil BROCKS, MD (Inactive) as Consulting Physician (Orthopedic Surgery) Fate Morna SAILOR, [facility] (Inactive) as Pharmacist (Pharmacist) Ladona Heinz, MD as Consulting Physician (Cardiology) Patrcia Sharper, MD as Consulting Physician (Ophthalmology) [facility]  Clinic Day:  08/28/2024  Referring physician: Joshua Debby CROME, MD  I connected with Garnette CHRISTELLA Petties on 08/28/24 at  8:30 AM EDT by telephone and verified that I am speaking with the correct person using two identifiers.   I discussed the limitations, risks, security and privacy concerns of performing an evaluation and management service by telemedicine and the availability of in-person appointments. I also discussed with the patient that there may be a patient responsible charge related to this service. The patient expressed understanding and agreed to proceed.   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  A CT scan done on December 16, 2023 demonstrated enhancing nodules in the right hemiabdominal mesentery associated with rounding enhancing foci within adjacent loops of right lower quadrant small bowel with a differential including carcinoid tumor or gist.  Additional findings on the CT included hepatic steatosis and a small hepatic cyst, mildly enlarged prostate, aortic atherosclerosis, coronary artery calcifications, and a small, nonobstructing left kidney stone.  The CT showed 1 enhancing nodule in the right Hemi abdominal central mesentery measuring 3.8 x 2.8 cm.  This was noted to be present on a CT scan in August  2016, previously measuring 3.0 x 1.9 cm.   On 01/27/2024, he did undergo laparoscopic assisted small bowel resection and biopsy of mesenteric mass.  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  Tumor invades visceral peritoneum/serosa.  Lymphovascular invasion was identified.  Surgical margins of resection are negative for tumor.  12 out of 14 lymph nodes positive for neuroendocrine tumor.  Pathology of mesentery mass shows benign fibrous tissue with chronic inflammation.  Pathology of masses from jejunum and ileum neuroendocrine tumor show multiple sites and multiple sizes.  He was labeled as multifocal histologic type and grade, well-differentiated neuroendocrine tumor, WHO grade 1.  Mitotic rate <1 mitosis/2 mm.  Ki-67 <1%.  Tumor invades visceral peritoneum with lymphovascular invasion present.  All margins are negative for tumor.  His case with discussed this morning during multidisciplinary GI tumor board.  General consensus is to closely monitor the patient with labs and baseline dotatate PET scan. -04/02/2024 - PET/CT scan showed solitary right lower quadrant mesenteric mass that was hypermetabolic and consistent with neuroendocrine metastatic disease.  There is no evidence of bowel metastases or residual bowel disease.  There was no evidence of liver metastases.  He does have hepatic steatosis.  - 08/09/2024 -patient having increased symptoms of fatigue, decreased appetite, unintentional weight loss.  Platelet count dropping.  Chromogranin A has normalized.   - CT CAP on 08/15/2024 showed stable mesenteric soft tissue mass and adjacent soft tissue nodules which were intensely positive on prior dotatate PET CT.  There was also patchy sclerosis in the left 5th and 6th ribs, new from prior PET/CT.  There are a few scattered, tiny pulmonary nodules, measuring up to 3 mm in diameter.  He  also had findings consistent with esophagitis    The patient understands  the plans discussed today and is in agreement with them.  He knows to contact our office if he develops concerns prior to his next appointment.  I provided *** minutes of face-to-face time during this encounter and > 50% was spent counseling as documented under my assessment and plan.    Powell FORBES Lessen, NP  [facility] - A DEPT OF [facility] [address] [place] [address] Dept: [phone] Dept Fax: [phone]   No orders of the defined types were placed in this encounter.     CHIEF COMPLAINT:  CC: Primary neuroendocrine tumor of small intestine  Current Treatment: Close surveillance  INTERVAL HISTORY:  Caleel is here today for repeat clinical assessment.  He last saw me on 08/09/2024.  Had been having increased fatigue, decreased appetite, and unintentional weight loss.  He underwent CT CAP on 08/15/2024.  This showed stable mesenteric soft tissue mass and adjacent soft tissue nodules which were intensely positive on prior dotatate PET CT.  There was also patchy sclerosis in the left 5th and 6th ribs, new from prior PET/CT.  There are a few scattered, tiny pulmonary nodules, measuring up to 3 mm in diameter.  He also had findings consistent with esophagitis.  Labs on 04/08/2024 showed normal chromogranin A at 23.2.  This is significantly lower than prior check, 6 months prior which was 137.6.  He denies fevers or chills. He denies pain. His appetite is good. His weight {Weight change:10426}.  I have reviewed the past medical history, past surgical history, social history and family history with the patient and they are unchanged from previous note.  ALLERGIES:  has no known allergies.  MEDICATIONS:  Current Outpatient Medications  Medication Sig Dispense Refill   b complex vitamins capsule Take 1 capsule by mouth daily. 90 capsule 1   busPIRone  (BUSPAR ) 5 MG tablet TAKE 1 TABLET BY MOUTH 3 TIMES A DAY 270  tablet 0   desvenlafaxine  (PRISTIQ ) 50 MG 24 hr tablet TAKE 1 TABLET BY MOUTH DAILY 90 tablet 0   gabapentin  (NEURONTIN ) 300 MG capsule Take 1 capsule (300 mg total) by mouth 3 (three) times daily. Schedule an appointment for further refills 270 capsule 0   losartan  (COZAAR ) 25 MG tablet Take 1 tablet (25 mg total) by mouth every evening. 90 tablet 3   rOPINIRole  (REQUIP  XL) 4 MG 24 hr tablet Take 1 tablet (4 mg total) by mouth at bedtime. 90 tablet 0   tirzepatide  (MOUNJARO ) 7.5 MG/0.5ML Pen Inject 7.5 mg into the skin once a week. 6 mL 1   traZODone  (DESYREL ) 150 MG tablet TAKE 1 TABLET BY MOUTH AT BEDTIME FOR SLEEP 90 tablet 0   No current facility-administered medications for this visit.    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 Solitary RIGHT lower quadrant mesenteric mass with intense  radiotracer activity consistent with neuroendocrine tumor metastasis. 2. No evidence of bowel metastasis or residual bowel disease. Post partial small bowel resection. 3. No evidence of liver metastasis. 4. Hepatic steatosis.   08/15/2024 Imaging   CT CAP with contrast IMPRESSION: 1. Stable mesenteric soft tissue mass and adjacent soft tissue nodules, which were intensely radiotracer active on prior Dotatate PET-CT, consistent with metastatic disease. 2. Patchy sclerosis in the left fifth and sixth ribs, new from prior PET-CT, at least  somewhat suspicious for osseous metastatic disease. 3. A few scattered tiny pulmonary nodules measuring up to 3 mm,nonspecific, suggest attention on follow-up imaging. 4. Marked diffuse hepatic steatosis. 5. Nonobstructive 6 mm left lower pole renal stone. 6. Enlarged main pulmonary artery, which can be seen in the setting of pulmonary arterial hypertension. 7. Mild symmetric esophageal wall thickening, which may reflect esophagitis. 8. Aortic atherosclerosis.       REVIEW OF SYSTEMS:   Constitutional: Denies fevers, chills or abnormal weight loss Eyes: Denies blurriness of vision Ears, nose, mouth, throat, and face: Denies mucositis or sore throat Respiratory: Denies cough, dyspnea or wheezes Cardiovascular: Denies palpitation, chest discomfort or lower extremity swelling Gastrointestinal:  Denies nausea, heartburn or change in bowel habits Skin: Denies abnormal skin rashes Lymphatics: Denies new lymphadenopathy or easy bruising Neurological:Denies numbness, tingling or new weaknesses Behavioral/Psych: Mood is stable, no new changes  All other systems were reviewed with the patient and are negative.   VITALS:  There were no vitals taken for this visit.  Wt Readings from Last 3 Encounters:  08/09/24 205 lb 14.4 oz (93.4 kg)  06/27/24 197 lb (89.4 kg)  04/11/24 219 lb 6.4 oz (99.5 kg)    There is no height or weight on file to calculate BMI.  Performance status (ECOG): {CHL ONC [id number]  PHYSICAL EXAM:   GENERAL:alert, no distress and comfortable SKIN: skin color, texture, turgor are normal, no rashes or significant lesions EYES: normal, Conjunctiva are pink and non-injected, sclera clear OROPHARYNX:no exudate, no erythema and lips, buccal mucosa, and tongue normal  NECK: supple, thyroid  normal size, non-tender, without nodularity LYMPH:  no palpable lymphadenopathy in the cervical, axillary or inguinal LUNGS: clear to auscultation and percussion with normal  breathing effort HEART: regular rate & rhythm and no murmurs and no lower extremity edema ABDOMEN:abdomen soft, non-tender and normal bowel sounds Musculoskeletal:no cyanosis of digits and no clubbing  NEURO: alert & oriented x 3 with fluent speech, no focal motor/sensory deficits  LABORATORY DATA:  I have reviewed the data as listed    Component Value Date/Time   NA 142 08/09/2024 1029   NA 144 12/11/2020 0853   K 4.0 08/09/2024 1029   CL 105 08/09/2024 1029   CO2 30 08/09/2024 1029   GLUCOSE 74 08/09/2024 1029   BUN 11 08/09/2024 1029   BUN 14 12/11/2020 0853   CREATININE 0.75 08/09/2024 1029   CREATININE 0.93 06/18/2020 0928   CALCIUM  9.3 08/09/2024 1029   PROT 7.4 08/09/2024 1029   PROT 6.6 07/12/2022 0911   ALBUMIN  4.3 08/09/2024 1029   AST 81 (H) 08/09/2024 1029   ALT 52 (H) 08/09/2024 1029   ALKPHOS 82 08/09/2024 1029   BILITOT 0.6 08/09/2024 1029   GFRNONAA >60 08/09/2024 1029   GFRNONAA 85 06/18/2020 0928   GFRAA 106 12/11/2020 0853   GFRAA 98 06/18/2020 0928    No results found for: SPEP, UPEP  Lab Results  Component Value Date   WBC 4.8 08/09/2024   NEUTROABS 3.1 08/09/2024  HGB 14.7 08/09/2024   HCT 42.3 08/09/2024   MCV 102.9 (H) 08/09/2024   PLT 92 (L) 08/09/2024      Chemistry      Component Value Date/Time   NA 142 08/09/2024 1029   NA 144 12/11/2020 0853   K 4.0 08/09/2024 1029   CL 105 08/09/2024 1029   CO2 30 08/09/2024 1029   BUN 11 08/09/2024 1029   BUN 14 12/11/2020 0853   CREATININE 0.75 08/09/2024 1029   CREATININE 0.93 06/18/2020 0928      Component Value Date/Time   CALCIUM  9.3 08/09/2024 1029   ALKPHOS 82 08/09/2024 1029   AST 81 (H) 08/09/2024 1029   ALT 52 (H) 08/09/2024 1029   BILITOT 0.6 08/09/2024 1029       RADIOGRAPHIC STUDIES: I have personally reviewed the radiological images as listed and agreed with the findings in the report. CT CHEST ABDOMEN PELVIS W CONTRAST Result Date: 08/17/2024 CLINICAL DATA:   Metastatic disease evaluation. History of neuroendocrine tumor of small intestine with metastasis. * Tracking Code: BO * EXAM: CT CHEST, ABDOMEN, AND PELVIS WITH CONTRAST TECHNIQUE: Multidetector CT imaging of the chest, abdomen and pelvis was performed following the standard protocol during bolus administration of intravenous contrast. RADIATION DOSE REDUCTION: This exam was performed according to the departmental dose-optimization program which includes automated exposure control, adjustment of the mA and/or kV according to patient size and/or use of iterative reconstruction technique. CONTRAST:  100mL ISOVUE -300 IOPAMIDOL  (ISOVUE -300) INJECTION 61% COMPARISON:  PET-CT Apr 02, 2024 FINDINGS: CT CHEST FINDINGS Cardiovascular: Aortic atherosclerosis. Enlarged main pulmonary artery measuring 3.5 cm in diameter. Normal size heart. No significant pericardial effusion/thickening. Mediastinum/Nodes: No suspicious thyroid  nodule. No pathologically enlarged mediastinal, hilar or axillary lymph nodes. Mild symmetric esophageal wall thickening. Lungs/Pleura: A few scattered tiny pulmonary nodules for instance a right lower lobe peri-fissural pulmonary nodule measuring 3 mm on image 81/5 and a right upper lobe pulmonary nodule measuring 2 mm on image 41/5. Scattered atelectasis/scarring. Musculoskeletal: Patchy sclerosis in the left fifth rib on image 71/5 in the left lateral sixth rib on image 85/6, this is new from prior PET-CT. Remote left healed rib fractures.  Thoracic spondylosis. CT ABDOMEN PELVIS FINDINGS Hepatobiliary: Marked diffuse hepatic steatosis. No suspicious hepatic lesion identified. Gallbladder is distended without wall thickening or pericholecystic fluid. No biliary ductal dilation. Pancreas: No pancreatic ductal dilation or evidence of acute inflammation. Spleen: No splenomegaly. Adrenals/Urinary Tract: No suspicious adrenal nodule/mass. Nonobstructive 6 mm left lower pole renal stone. No hydronephrosis.  Too small to accurately characterize hypodense left lower pole renal lesion. Urinary bladder is unremarkable for degree of distension. Stomach/Bowel: Stomach is minimally distended limiting evaluation. No pathologic dilation of small or large bowel. Vascular/Lymphatic: Normal caliber abdominal aorta. Aortic atherosclerosis. Smooth IVC contours. No pathologically enlarged abdominal or pelvic lymph nodes. Reproductive: Dystrophic calcifications with heterogeneous enhancement of an enlarged prostate gland. Other: Mesenteric soft tissue mass measuring 4 x 3.1 cm on image 85/3, similar to prior when remeasured and was intensely radiotracer active on prior Dotatate PET-CT. Additional small adjacent soft tissue nodules measuring up to 9 mm on image 85/3 are also stable from prior. Similar mesenteric stranding. Musculoskeletal: Bony ankylosis of the pubic symphysis. Partial bony ankylosis of the left SI joint. Lumbar spondylosis. IMPRESSION: 1. Stable mesenteric soft tissue mass and adjacent soft tissue nodules, which were intensely radiotracer active on prior Dotatate PET-CT, consistent with metastatic disease. 2. Patchy sclerosis in the left fifth and sixth ribs, new from prior PET-CT, at  least somewhat suspicious for osseous metastatic disease. 3. A few scattered tiny pulmonary nodules measuring up to 3 mm, nonspecific, suggest attention on follow-up imaging. 4. Marked diffuse hepatic steatosis. 5. Nonobstructive 6 mm left lower pole renal stone. 6. Enlarged main pulmonary artery, which can be seen in the setting of pulmonary arterial hypertension. 7. Mild symmetric esophageal wall thickening, which may reflect esophagitis. 8. Aortic atherosclerosis. Aortic Atherosclerosis (ICD10-I70.0). Electronically Signed   By: Reyes Holder M.D.   On: 08/17/2024 17:12

## 2024-08-28 NOTE — Assessment & Plan Note (Signed)
 Small bowel mass found incidentally as part of a workup for urinary urgency with [facility].  A CT scan done on December 16, 2023 demonstrated enhancing nodules in the right hemiabdominal mesentery associated with rounding enhancing foci within adjacent loops of right lower quadrant small bowel with a differential including carcinoid tumor or gist.  Additional findings on the CT included hepatic steatosis and a small hepatic cyst, mildly enlarged prostate, aortic atherosclerosis, coronary artery calcifications, and a small, nonobstructing left kidney stone.  The CT showed 1 enhancing nodule in the right Hemi abdominal central mesentery measuring 3.8 x 2.8 cm.  This was noted to be present on a CT scan in August 2016, previously measuring 3.0 x 1.9 cm.   On 01/27/2024, he did undergo laparoscopic assisted small bowel resection and biopsy of mesenteric mass.  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  Tumor invades visceral peritoneum/serosa.  Lymphovascular invasion was identified.  Surgical margins of resection are negative for tumor.  12 out of 14 lymph nodes positive for neuroendocrine tumor.  Pathology of mesentery mass shows benign fibrous tissue with chronic inflammation.  Pathology of masses from jejunum and ileum neuroendocrine tumor show multiple sites and multiple sizes.  He was labeled as multifocal histologic type and grade, well-differentiated neuroendocrine tumor, WHO grade 1.  Mitotic rate <1 mitosis/2 mm.  Ki-67 <1%.  Tumor invades visceral peritoneum with lymphovascular invasion present.  All margins are negative for tumor.  His case with discussed this morning during multidisciplinary GI tumor board.  General consensus is to closely monitor the patient with labs and baseline dotatate PET scan. -04/02/2024 - PET/CT scan showed solitary right lower quadrant mesenteric mass that was hypermetabolic and consistent with neuroendocrine metastatic  disease.  There is no evidence of bowel metastases or residual bowel disease.  There was no evidence of liver metastases.  He does have hepatic steatosis.  - 08/09/2024 -patient having increased symptoms of fatigue, decreased appetite, unintentional weight loss.  Platelet count dropping.  Chromogranin A has normalized.   - CT CAP on 08/15/2024 showed stable mesenteric soft tissue mass and adjacent soft tissue nodules which were intensely positive on prior dotatate PET CT.  There was also patchy sclerosis in the left 5th and 6th ribs, new from prior PET/CT.  There are a few scattered, tiny pulmonary nodules, measuring up to 3 mm in diameter.  He also had findings consistent with esophagitis - 08/29/2024 -stat dotatate PET/CT ordered today.  Plan to check labs, including chromogranin A.  Follow-up 1 week after PET/CT to review results of scan and labs.

## 2024-08-29 ENCOUNTER — Encounter: Payer: Self-pay | Admitting: Nurse Practitioner

## 2024-08-29 ENCOUNTER — Other Ambulatory Visit: Payer: Self-pay

## 2024-08-29 ENCOUNTER — Inpatient Hospital Stay: Attending: Nurse Practitioner | Admitting: Nurse Practitioner

## 2024-08-29 DIAGNOSIS — C7A8 Other malignant neuroendocrine tumors: Secondary | ICD-10-CM | POA: Insufficient documentation

## 2024-08-30 ENCOUNTER — Telehealth: Payer: Self-pay | Admitting: Hematology

## 2024-08-30 NOTE — Telephone Encounter (Signed)
 Pt has been scheduled for a lab appointment on 10/7 and follow up with Dr. Lanny on 10/27. He could not come in 10/20-10/23 due to being out of town.

## 2024-09-04 ENCOUNTER — Inpatient Hospital Stay

## 2024-09-04 ENCOUNTER — Encounter (HOSPITAL_COMMUNITY)
Admission: RE | Admit: 2024-09-04 | Discharge: 2024-09-04 | Disposition: A | Discharge status: Home / Self Care | Source: Ambulatory Visit | Attending: Nurse Practitioner | Admitting: Nurse Practitioner

## 2024-09-04 DIAGNOSIS — M545 Low back pain, unspecified: Secondary | ICD-10-CM | POA: Diagnosis not present

## 2024-09-04 DIAGNOSIS — C7A8 Other malignant neuroendocrine tumors: Secondary | ICD-10-CM | POA: Diagnosis not present

## 2024-09-04 DIAGNOSIS — M542 Cervicalgia: Secondary | ICD-10-CM | POA: Diagnosis not present

## 2024-09-04 DIAGNOSIS — D3A8 Other benign neuroendocrine tumors: Secondary | ICD-10-CM

## 2024-09-04 LAB — CBC WITH DIFFERENTIAL (CANCER CENTER ONLY)
Abs Immature Granulocytes: 0.02 K/uL (ref 0.00–0.07)
Basophils Absolute: 0 K/uL (ref 0.0–0.1)
Basophils Relative: 1 %
Eosinophils Absolute: 0.1 K/uL (ref 0.0–0.5)
Eosinophils Relative: 1 %
HCT: 40.3 % (ref 39.0–52.0)
Hemoglobin: 14.1 g/dL (ref 13.0–17.0)
Immature Granulocytes: 0 %
Lymphocytes Relative: 21 %
Lymphs Abs: 1.3 K/uL (ref 0.7–4.0)
MCH: 36.2 pg — ABNORMAL HIGH (ref 26.0–34.0)
MCHC: 35 g/dL (ref 30.0–36.0)
MCV: 103.6 fL — ABNORMAL HIGH (ref 80.0–100.0)
Monocytes Absolute: 0.5 K/uL (ref 0.1–1.0)
Monocytes Relative: 8 %
Neutro Abs: 4.4 K/uL (ref 1.7–7.7)
Neutrophils Relative %: 69 %
Platelet Count: 136 K/uL — ABNORMAL LOW (ref 150–400)
RBC: 3.89 MIL/uL — ABNORMAL LOW (ref 4.22–5.81)
RDW: 12.5 % (ref 11.5–15.5)
WBC Count: 6.3 K/uL (ref 4.0–10.5)
nRBC: 0 % (ref 0.0–0.2)

## 2024-09-04 LAB — CMP (CANCER CENTER ONLY)
ALT: 35 U/L (ref 0–44)
AST: 51 U/L — ABNORMAL HIGH (ref 15–41)
Albumin: 4.2 g/dL (ref 3.5–5.0)
Alkaline Phosphatase: 71 U/L (ref 38–126)
Anion gap: 7 (ref 5–15)
BUN: 10 mg/dL (ref 8–23)
CO2: 28 mmol/L (ref 22–32)
Calcium: 9.8 mg/dL (ref 8.9–10.3)
Chloride: 105 mmol/L (ref 98–111)
Creatinine: 0.65 mg/dL (ref 0.61–1.24)
GFR, Estimated: >60 mL/min (ref >60)
Glucose, Bld: 81 mg/dL (ref 70–99)
Potassium: 3.9 mmol/L (ref 3.5–5.1)
Sodium: 140 mmol/L (ref 135–145)
Total Bilirubin: 0.6 mg/dL (ref 0.0–1.2)
Total Protein: 7.4 g/dL (ref 6.5–8.1)

## 2024-09-04 LAB — FERRITIN: Ferritin: 357 ng/mL — ABNORMAL HIGH (ref 24–336)

## 2024-09-04 MED ORDER — COPPER CU 64 DOTATATE 1 MCI/ML IV SOLN
4.0000 | Freq: Once | INTRAVENOUS | Status: AC
  Administered 2024-09-04: 4.19 via INTRAVENOUS

## 2024-09-05 ENCOUNTER — Telehealth: Admitting: Hematology

## 2024-09-05 DIAGNOSIS — C7A8 Other malignant neuroendocrine tumors: Secondary | ICD-10-CM | POA: Diagnosis not present

## 2024-09-05 LAB — CHROMOGRANIN A: Chromogranin A (ng/mL): 58.6 ng/mL (ref 0.0–101.8)

## 2024-09-11 DIAGNOSIS — F419 Anxiety disorder, unspecified: Secondary | ICD-10-CM | POA: Diagnosis not present

## 2024-09-11 DIAGNOSIS — R911 Solitary pulmonary nodule: Secondary | ICD-10-CM | POA: Diagnosis not present

## 2024-09-11 DIAGNOSIS — Z803 Family history of malignant neoplasm of breast: Secondary | ICD-10-CM | POA: Diagnosis not present

## 2024-09-11 DIAGNOSIS — F1010 Alcohol abuse, uncomplicated: Secondary | ICD-10-CM | POA: Diagnosis not present

## 2024-09-11 DIAGNOSIS — R634 Abnormal weight loss: Secondary | ICD-10-CM | POA: Diagnosis not present

## 2024-09-11 DIAGNOSIS — Z9889 Other specified postprocedural states: Secondary | ICD-10-CM | POA: Diagnosis not present

## 2024-09-11 DIAGNOSIS — I10 Essential (primary) hypertension: Secondary | ICD-10-CM | POA: Diagnosis not present

## 2024-09-11 DIAGNOSIS — C7B01 Secondary carcinoid tumors of distant lymph nodes: Secondary | ICD-10-CM | POA: Diagnosis not present

## 2024-09-11 DIAGNOSIS — I4892 Unspecified atrial flutter: Secondary | ICD-10-CM | POA: Diagnosis not present

## 2024-09-11 DIAGNOSIS — C7A019 Malignant carcinoid tumor of the small intestine, unspecified portion: Secondary | ICD-10-CM | POA: Diagnosis not present

## 2024-09-11 DIAGNOSIS — C7B09 Secondary carcinoid tumors of other sites: Secondary | ICD-10-CM | POA: Diagnosis not present

## 2024-09-11 DIAGNOSIS — Z808 Family history of malignant neoplasm of other organs or systems: Secondary | ICD-10-CM | POA: Diagnosis not present

## 2024-09-11 DIAGNOSIS — R5383 Other fatigue: Secondary | ICD-10-CM | POA: Diagnosis not present

## 2024-09-11 DIAGNOSIS — Z8639 Personal history of other endocrine, nutritional and metabolic disease: Secondary | ICD-10-CM | POA: Diagnosis not present

## 2024-09-11 DIAGNOSIS — R599 Enlarged lymph nodes, unspecified: Secondary | ICD-10-CM | POA: Diagnosis not present

## 2024-09-11 DIAGNOSIS — Z800 Family history of malignant neoplasm of digestive organs: Secondary | ICD-10-CM | POA: Diagnosis not present

## 2024-09-11 DIAGNOSIS — R630 Anorexia: Secondary | ICD-10-CM | POA: Diagnosis not present

## 2024-09-11 DIAGNOSIS — Z96652 Presence of left artificial knee joint: Secondary | ICD-10-CM | POA: Diagnosis not present

## 2024-09-11 DIAGNOSIS — C7A8 Other malignant neuroendocrine tumors: Secondary | ICD-10-CM | POA: Diagnosis not present

## 2024-09-23 NOTE — Assessment & Plan Note (Deleted)
 pT2N2M0 with unresectable mesentery node metastasis  -Diagnosed in 11/2023, s/p laparoscopic assisted small bowel resection and biopsy of mesenteric mass. Pathology of small bowel mass showed well-differentiated, neuroendocrine tumor with 12/14 positive nodes. Biopsy of the large mesentery mass was benign, but it was hypermetabolic on Dotatate PET in 03/2024, no other mets. -Unfortunately the residual mesentery node metastasis is not resectable, he is not symptomatic, will continue monitoring.

## 2024-09-24 ENCOUNTER — Ambulatory Visit

## 2024-09-24 ENCOUNTER — Inpatient Hospital Stay: Admitting: Hematology

## 2024-09-24 ENCOUNTER — Telehealth: Payer: Self-pay

## 2024-09-24 DIAGNOSIS — C7A8 Other malignant neuroendocrine tumors: Secondary | ICD-10-CM

## 2024-09-24 NOTE — Telephone Encounter (Signed)
 Patient did not arrive to his scheduled office visit today. Attempted to contact the patient. Unable to reach the patient @T [phone]. LVM to contact the facility.

## 2024-09-25 DIAGNOSIS — M545 Low back pain, unspecified: Secondary | ICD-10-CM | POA: Diagnosis not present

## 2024-09-25 DIAGNOSIS — M542 Cervicalgia: Secondary | ICD-10-CM | POA: Diagnosis not present

## 2024-10-04 DIAGNOSIS — G5603 Carpal tunnel syndrome, bilateral upper limbs: Secondary | ICD-10-CM | POA: Diagnosis not present

## 2024-10-05 ENCOUNTER — Other Ambulatory Visit: Payer: Self-pay | Admitting: Internal Medicine

## 2024-10-05 DIAGNOSIS — G2581 Restless legs syndrome: Secondary | ICD-10-CM

## 2024-10-05 DIAGNOSIS — F332 Major depressive disorder, recurrent severe without psychotic features: Secondary | ICD-10-CM

## 2024-10-05 DIAGNOSIS — F411 Generalized anxiety disorder: Secondary | ICD-10-CM

## 2024-10-27 ENCOUNTER — Other Ambulatory Visit: Payer: Self-pay | Admitting: Family

## 2024-11-12 ENCOUNTER — Other Ambulatory Visit: Payer: Self-pay | Admitting: Internal Medicine

## 2024-11-12 ENCOUNTER — Other Ambulatory Visit: Payer: Self-pay | Admitting: Family

## 2024-11-12 DIAGNOSIS — F411 Generalized anxiety disorder: Secondary | ICD-10-CM

## 2024-11-12 DIAGNOSIS — E118 Type 2 diabetes mellitus with unspecified complications: Secondary | ICD-10-CM

## 2024-11-12 DIAGNOSIS — F332 Major depressive disorder, recurrent severe without psychotic features: Secondary | ICD-10-CM

## 2024-11-15 ENCOUNTER — Other Ambulatory Visit: Payer: Self-pay | Admitting: Internal Medicine

## 2024-11-15 DIAGNOSIS — E118 Type 2 diabetes mellitus with unspecified complications: Secondary | ICD-10-CM

## 2024-11-24 ENCOUNTER — Other Ambulatory Visit: Payer: Self-pay | Admitting: Internal Medicine

## 2024-11-24 DIAGNOSIS — F411 Generalized anxiety disorder: Secondary | ICD-10-CM

## 2024-11-24 DIAGNOSIS — E118 Type 2 diabetes mellitus with unspecified complications: Secondary | ICD-10-CM

## 2024-11-24 DIAGNOSIS — G619 Inflammatory polyneuropathy, unspecified: Secondary | ICD-10-CM

## 2024-11-24 DIAGNOSIS — F332 Major depressive disorder, recurrent severe without psychotic features: Secondary | ICD-10-CM

## 2024-11-26 ENCOUNTER — Other Ambulatory Visit: Payer: Self-pay | Admitting: Internal Medicine

## 2024-11-26 DIAGNOSIS — E118 Type 2 diabetes mellitus with unspecified complications: Secondary | ICD-10-CM

## 2024-11-26 NOTE — Telephone Encounter (Signed)
 Copied from CRM #[id number]. Topic: Clinical - Medication Refill >> Nov 26, 2024  7:57 AM Franky GRADE wrote: Medication:  Tirzepatide  7.5 MG/0.5ML INJECT 7.5 MG UNDER THE SKIN ONCE WEEKLY  traZODone  HCl 150 mg Oral At bedtime PRN, for sleep  Gabapentin  300 MG TAKE 1 CAPSULE BY MOUTH 3 TIMES A DAY SCHEDULE AN APPOINTMENT WITH MD FOR FUTURE REFILLS   Has the patient contacted their pharmacy? Yes, they asked patient to call the office.  (Agent: If no, request that the patient contact the pharmacy for the refill. If patient does not wish to contact the pharmacy document the reason why and proceed with request.) (Agent: If yes, when and what did the pharmacy advise?)  This is the patient's preferred pharmacy:  [facility] [id number] - [place], [place] - [address] [place] [address] Phone: [phone] Fax: [phone]    Is this the correct pharmacy for this prescription? Yes If no, delete pharmacy and type the correct one.   Has the prescription been filled recently? No  Is the patient out of the medication? Yes  Has the patient been seen for an appointment in the last year OR does the patient have an upcoming appointment? Yes, scheduled his physical for 12/25/2024, needs medication to hold him over until that appointment.   Can we respond through MyChart? No, patient would prefer a phone call.   Agent: Please be advised that Rx refills may take up to 3 business days. We ask that you follow-up with your pharmacy.

## 2024-11-27 ENCOUNTER — Other Ambulatory Visit: Payer: Self-pay

## 2024-11-27 ENCOUNTER — Ambulatory Visit (INDEPENDENT_AMBULATORY_CARE_PROVIDER_SITE_OTHER)

## 2024-11-27 VITALS — Ht 70.0 in | Wt 205.0 lb

## 2024-11-27 DIAGNOSIS — G622 Polyneuropathy due to other toxic agents: Secondary | ICD-10-CM

## 2024-11-27 DIAGNOSIS — G619 Inflammatory polyneuropathy, unspecified: Secondary | ICD-10-CM | POA: Diagnosis not present

## 2024-11-27 DIAGNOSIS — Z0000 Encounter for general adult medical examination without abnormal findings: Secondary | ICD-10-CM | POA: Diagnosis not present

## 2024-11-27 MED ORDER — GABAPENTIN 300 MG PO CAPS: 300.0000 mg | ORAL_CAPSULE | Freq: Three times a day (TID) | ORAL | 0 refills | Status: DC

## 2024-11-27 NOTE — Patient Instructions (Signed)
 Mr. Goyne,  Thank you for taking the time for your Medicare Wellness Visit. I appreciate your continued commitment to your health goals. Please review the care plan we discussed, and feel free to reach out if I can assist you further.  Please note that Annual Wellness Visits do not include a physical exam. Some assessments may be limited, especially if the visit was conducted virtually. If needed, we may recommend an in-person follow-up with your provider.  Ongoing Care Seeing your primary care provider every 3 to 6 months helps us  monitor your health and provide consistent, personalized care.   Referrals If a referral was made during today's visit and you haven't received any updates within two weeks, please contact the referred provider directly to check on the status.  Recommended Screenings:  Health Maintenance  Topic Date Due   Eye exam for diabetics  04/10/2024   Flu Shot  06/29/2024   Hemoglobin A1C  10/12/2024   Yearly kidney health urinalysis for diabetes  04/11/2025   Complete foot exam   04/11/2025   Yearly kidney function blood test for diabetes  09/04/2025   Medicare Annual Wellness Visit  11/27/2025   Colon Cancer Screening  04/13/2026   DTaP/Tdap/Td vaccine (2 - Td or Tdap) 04/29/2027   Pneumococcal Vaccine for age over 14  Completed   Hepatitis C Screening  Completed   Zoster (Shingles) Vaccine  Completed   Meningitis B Vaccine  Aged Out   COVID-19 Vaccine  Discontinued       11/27/2024    8:52 AM  Advanced Directives  Does Patient Have a Medical Advance Directive? Yes  Type of Estate Agent of [place];Living will  Does patient want to make changes to medical advance directive? Yes (Inpatient - patient requests chaplain consult to change a medical advance directive)  Copy of Healthcare Power of Attorney in Chart? No - copy requested    Vision: Annual vision screenings are recommended for early detection of glaucoma, cataracts, and  diabetic retinopathy. These exams can also reveal signs of chronic conditions such as diabetes and high blood pressure.  Dental: Annual dental screenings help detect early signs of oral cancer, gum disease, and other conditions linked to overall health, including heart disease and diabetes.

## 2024-11-27 NOTE — Progress Notes (Signed)
 "  Subjective:     Chief Complaint  Patient presents with   Medicare Wellness     Subjective:   Lee Lee is a 73 y.o. male who presents for a Medicare Annual Wellness Visit.  Visit info / Clinical Intake: Medicare Wellness Visit Type:: Subsequent Annual Wellness Visit Persons participating in visit and providing information:: patient Medicare Wellness Visit Mode:: Telephone If telephone:: video declined Since this visit was completed virtually, some vitals may be partially provided or unavailable. Missing vitals are due to the limitations of the virtual format.: Documented vitals are patient reported If Telephone or Video please confirm:: I connected with patient using audio/video enable telemedicine. I verified patient identity with two identifiers, discussed telehealth limitations, and patient agreed to proceed. Patient Location:: Home Provider Location:: Office Interpreter Needed?: No Pre-visit prep was completed: yes AWV questionnaire completed by patient prior to visit?: yes Living arrangements:: lives with spouse/significant other Patient's Overall Health Status Rating: good Typical amount of pain: some Does pain affect daily life?: (!) yes Are you currently prescribed opioids?: no  Dietary Habits and Nutritional Risks How many meals a day?: 2 Eats fruit and vegetables daily?: yes Most meals are obtained by: preparing own meals; eating out In the last 2 weeks, have you had any of the following?: none Diabetic:: (!) yes Any non-healing wounds?: no How often do you check your BS?: 0 Would you like to be referred to a Nutritionist or for Diabetic Management? : no  Functional Status Activities of Daily Living (to include ambulation/medication): Independent Ambulation: Independent with device- listed below Home Assistive Devices/Equipment: Eyeglasses Medication Administration: Independent Home Management (perform basic housework or laundry): Independent Manage  your own finances?: yes Primary transportation is: driving Concerns about vision?: no *vision screening is required for WTM* Concerns about hearing?: no  Fall Screening Falls in the past year?: 0 Number of falls in past year: 0 Was there an injury with Fall?: 0 Fall Risk Category Calculator: 0 Patient Fall Risk Level: Low Fall Risk  Fall Risk Patient at Risk for Falls Due to: No Fall Risks Fall risk Follow up: Falls evaluation completed; Falls prevention discussed  Home and Transportation Safety: All rugs have non-skid backing?: yes All stairs or steps have railings?: yes Grab bars in the bathtub or shower?: yes Have non-skid surface in bathtub or shower?: yes Good home lighting?: yes Regular seat belt use?: yes Hospital stays in the last year:: (!) yes How many hospital stays:: 1 Reason: hypotension/dehydration  Cognitive Assessment Difficulty concentrating, remembering, or making decisions? : no Will 6CIT or Mini Cog be Completed: yes What year is it?: 0 points What month is it?: 0 points Give patient an address phrase to remember (5 components): [address] [place], [place] About what time is it?: 0 points Count backwards from 20 to 1: 0 points Say the months of the year in reverse: 0 points Repeat the address phrase from earlier: 0 points 6 CIT Score: 0 points  Advance Directives (For Healthcare) Does Patient Have a Medical Advance Directive?: Yes Does patient want to make changes to medical advance directive?: Yes (Inpatient - patient requests chaplain consult to change a medical advance directive) Type of Advance Directive: Healthcare Power of [place]; Living will Copy of Healthcare Power of Attorney in Chart?: No - copy requested Copy of Living Will in Chart?: No - copy requested  Reviewed/Updated  Reviewed/Updated: Reviewed All (Medical, Surgical, Family, Medications, Allergies, Care Teams, Patient Goals)    Allergies (verified) Patient has no  known  allergies.   Current Medications (verified) Outpatient Encounter Medications as of 11/27/2024  Medication Sig   b complex vitamins capsule Take 1 capsule by mouth daily.   busPIRone  (BUSPAR ) 5 MG tablet TAKE 1 TABLET BY MOUTH 3 TIMES A DAY   desvenlafaxine  (PRISTIQ ) 50 MG 24 hr tablet TAKE 1 TABLET BY MOUTH DAILY   gabapentin  (NEURONTIN ) 300 MG capsule Take 300 mg by mouth 3 (three) times daily.   losartan  (COZAAR ) 25 MG tablet Take 1 tablet (25 mg total) by mouth every evening.   MOUNJARO  7.5 MG/0.5ML Pen INJECT 7.5 MG UNDER THE SKIN ONCE WEEKLY   rOPINIRole  (REQUIP  XL) 4 MG 24 hr tablet TAKE 1 TABLET BY MOUTH AT BEDTIME   traZODone  (DESYREL ) 150 MG tablet TAKE 1 TABLET BY MOUTH AT BEDTIME AS NEEDED FOR SLEEP   [DISCONTINUED] gabapentin  (NEURONTIN ) 300 MG capsule TAKE 1 CAPSULE BY MOUTH 3 TIMES A DAY SCHEDULE AN APPOINTMENT WITH MD FOR FUTURE REFILLS (Patient not taking: Reported on 11/27/2024)   No facility-administered encounter medications on file as of 11/27/2024.     History: Past Medical History:  Diagnosis Date   Alcohol abuse, episodic drinking behavior    Anxiety    Arthritis    back & knees   Atrial flutter (HCC)    s/p RFCA 01/05/13   Cancer (HCC)    skin cancer on nose   Complication of anesthesia    makes him loopy   Depression    ED (erectile dysfunction)    Hemorrhoids    History of kidney stones    Hypertension    Pre-diabetes    Past Surgical History:  Procedure Laterality Date   ABLATION OF DYSRHYTHMIC FOCUS  01/05/2013   ARTHROSCOPIC REPAIR ACL     ATRIAL FLUTTER ABLATION N/A 01/05/2013   Procedure: ATRIAL FLUTTER ABLATION;  Surgeon: Danelle LELON Birmingham, MD;  Location[facility] C[facility] LAB;  Service: Cardiovascular;  Laterality: N/A;   COLONOSCOPY  11/2009   Dr. Rollin   ELECTROPHYSIOLOGIC STUDY N/A 07/04/2015   Procedure: A-Flutter Ablation;  Surgeon: Danelle LELON Birmingham, MD;  Location: [facility] INVASIVE CV LAB;  Service: Cardiovascular;  Laterality: N/A;   right knee  reconstruction     ROTATOR CUFF REPAIR     TOTAL KNEE ARTHROPLASTY Left 10/30/2019   Procedure: TOTAL KNEE ARTHROPLASTY;  Surgeon: Ernie Cough, MD;  Location: [facility] ORS;  Service: Orthopedics;  Laterality: Left;  70 mins   XI ROBOTIC ASSISTED SMALL BOWEL RESECTION N/A 01/27/2024   Procedure: LAPARASCOPIC ASSISTED SMALL BOWEL RESECTION;  Surgeon: Signe Mitzie LABOR, MD;  Location: [facility] ORS;  Service: General;  Laterality: N/A;  90 MINUTES   Family History  Problem Relation Age of Onset   Valvular heart disease Mother    Lymphoma Mother    Restless legs syndrome Mother    Neuropathy Neg Hx    Social History   Occupational History   Occupation: Retired  Tobacco Use   Smoking status: Former    Types: Cigars    Quit date: 2002    Years since quitting: 24.0    Passive exposure: Past   Smokeless tobacco: Current    Types: Chew   Tobacco comments:    chews tobacco when playing golf only  Vaping Use   Vaping status: Never Used  Substance and Sexual Activity   Alcohol use: Not Currently   Drug use: No   Sexual activity: Yes    Partners: Female    Comment: married   Tobacco Counseling Ready  to quit: No Counseling given: Yes Tobacco comments: chews tobacco when playing golf only  SDOH Screenings   Food Insecurity: No Food Insecurity (11/27/2024)  Housing: Low Risk (11/27/2024)  Transportation Needs: No Transportation Needs (11/27/2024)  Utilities: Not At Risk (11/27/2024)  Alcohol Screen: Low Risk (09/12/2023)  Depression (PHQ2-9): Low Risk (11/27/2024)  Financial Resource Strain: Low Risk (11/26/2024)  Physical Activity: Insufficiently Active (11/27/2024)  Social Connections: Socially Integrated (11/27/2024)  Stress: No Stress Concern Present (11/27/2024)  Tobacco Use: High Risk (11/27/2024)  Health Literacy: Adequate Health Literacy (11/27/2024)   See flowsheets for full screening details  Depression Screen PHQ 2 & 9 Depression Scale- Over the past 2 weeks, how often have  you been bothered by any of the following problems? Little interest or pleasure in doing things: 0 Feeling down, depressed, or hopeless (PHQ Adolescent also includes...irritable): 0 PHQ-2 Total Score: 0 Trouble falling or staying asleep, or sleeping too much: 0 Feeling tired or having little energy: 0 Poor appetite or overeating (PHQ Adolescent also includes...weight loss): 0 Feeling bad about yourself - or that you are a failure or have let yourself or your family down: 0 Trouble concentrating on things, such as reading the newspaper or watching television (PHQ Adolescent also includes...like school work): 0 Moving or speaking so slowly that other people could have noticed. Or the opposite - being so fidgety or restless that you have been moving around a lot more than usual: 0 Thoughts that you would be better off dead, or of hurting yourself in some way: 0 PHQ-9 Total Score: 0 If you checked off any problems, how difficult have these problems made it for you to do your work, take care of things at home, or get along with other people?: Not difficult at all  Depression Treatment Depression Interventions/Treatment : EYV7-0 Score <4 Follow-up Not Indicated; Medication; Currently on Treatment     Goals Addressed               This Visit's Progress     Patient Stated (pt-stated)        Patient stated he plans to continue play more golf and managing his health             Objective:    Today's Vitals   11/27/24 0850  Weight: 205 lb (93 kg)  Height: 5' 10 (1.778 m)   Body mass index is 29.41 kg/m.  Hearing/Vision screen Hearing Screening - Comments:: Denies hearing difficulties   Vision Screening - Comments:: Wears rx glasses - up to date with routine eye exams with Ozell Bertin Immunizations and Health Maintenance Health Maintenance  Topic Date Due   OPHTHALMOLOGY EXAM  04/10/2024   HEMOGLOBIN A1C  10/12/2024   Diabetic kidney evaluation - Urine ACR  04/11/2025    FOOT EXAM  04/11/2025   Diabetic kidney evaluation - eGFR measurement  09/04/2025   Medicare Annual Wellness (AWV)  11/27/2025   Colonoscopy  04/13/2026   DTaP/Tdap/Td (2 - Td or Tdap) 04/29/2027   Pneumococcal Vaccine: 50+ Years  Completed   Influenza Vaccine  Completed   Hepatitis C Screening  Completed   Zoster Vaccines- Shingrix  Completed   Meningococcal B Vaccine  Aged Out   COVID-19 Vaccine  Discontinued        Assessment/Plan:  This is a routine wellness examination for Callahan.  Patient Care Team: Joshua Debby CROME, MD as PCP - General (Internal Medicine) Barbarann Oneil BROCKS, MD (Inactive) as Consulting Physician (Orthopedic Surgery) Fate Morna SAILOR, [facility] (  Inactive) as Pharmacist (Pharmacist) Ladona Heinz, MD as Consulting Physician (Cardiology) Patrcia Sharper, MD as Consulting Physician (Ophthalmology)  I have personally reviewed and noted the following in the patients chart:   Medical and social history Use of alcohol, tobacco or illicit drugs  Current medications and supplements including opioid prescriptions. Functional ability and status Nutritional status Physical activity Advanced directives List of other physicians Hospitalizations, surgeries, and ER visits in previous 12 months Vitals Screenings to include cognitive, depression, and falls Referrals and appointments  No orders of the defined types were placed in this encounter.  In addition, I have reviewed and discussed with patient certain preventive protocols, quality metrics, and best practice recommendations. A written personalized care plan for preventive services as well as general preventive health recommendations were provided to patient.   Verdie CHRISTELLA Saba, CMA   11/27/2024   Return in 1 year (on 11/27/2025).  After Visit Summary: (MyChart) Due to this being a telephonic visit, the after visit summary with patients personalized plan was offered to patient via MyChart   Nurse Notes:  Appointment(s) made: (CPE appt w/PCP scheduled for 11/2024)  "

## 2024-12-11 ENCOUNTER — Ambulatory Visit: Admitting: Internal Medicine

## 2024-12-11 ENCOUNTER — Ambulatory Visit: Payer: Self-pay | Admitting: Internal Medicine

## 2024-12-11 ENCOUNTER — Encounter: Payer: Self-pay | Admitting: Internal Medicine

## 2024-12-11 VITALS — BP 142/86 | HR 65 | Temp 98.1°F | Resp 16 | Ht 70.0 in | Wt 212.4 lb

## 2024-12-11 DIAGNOSIS — F332 Major depressive disorder, recurrent severe without psychotic features: Secondary | ICD-10-CM

## 2024-12-11 DIAGNOSIS — E538 Deficiency of other specified B group vitamins: Secondary | ICD-10-CM | POA: Diagnosis not present

## 2024-12-11 DIAGNOSIS — D696 Thrombocytopenia, unspecified: Secondary | ICD-10-CM | POA: Diagnosis not present

## 2024-12-11 DIAGNOSIS — F411 Generalized anxiety disorder: Secondary | ICD-10-CM | POA: Diagnosis not present

## 2024-12-11 DIAGNOSIS — I10 Essential (primary) hypertension: Secondary | ICD-10-CM

## 2024-12-11 DIAGNOSIS — R972 Elevated prostate specific antigen [PSA]: Secondary | ICD-10-CM

## 2024-12-11 DIAGNOSIS — E785 Hyperlipidemia, unspecified: Secondary | ICD-10-CM | POA: Diagnosis not present

## 2024-12-11 DIAGNOSIS — N400 Enlarged prostate without lower urinary tract symptoms: Secondary | ICD-10-CM

## 2024-12-11 DIAGNOSIS — I77810 Thoracic aortic ectasia: Secondary | ICD-10-CM

## 2024-12-11 DIAGNOSIS — C7A8 Other malignant neuroendocrine tumors: Secondary | ICD-10-CM | POA: Diagnosis not present

## 2024-12-11 DIAGNOSIS — K7581 Nonalcoholic steatohepatitis (NASH): Secondary | ICD-10-CM

## 2024-12-11 DIAGNOSIS — E1140 Type 2 diabetes mellitus with diabetic neuropathy, unspecified: Secondary | ICD-10-CM

## 2024-12-11 DIAGNOSIS — G2581 Restless legs syndrome: Secondary | ICD-10-CM | POA: Diagnosis not present

## 2024-12-11 DIAGNOSIS — N3281 Overactive bladder: Secondary | ICD-10-CM | POA: Diagnosis not present

## 2024-12-11 LAB — LIPID PANEL
Cholesterol: 187 mg/dL (ref 28–200)
HDL: 99 mg/dL
LDL Cholesterol: 76 mg/dL (ref 10–99)
NonHDL: 88.04
Total CHOL/HDL Ratio: 2
Triglycerides: 61 mg/dL (ref 10.0–149.0)
VLDL: 12.2 mg/dL (ref 0.0–40.0)

## 2024-12-11 LAB — BASIC METABOLIC PANEL WITH GFR
BUN: 11 mg/dL (ref 6–23)
CO2: 28 meq/L (ref 19–32)
Calcium: 9.3 mg/dL (ref 8.4–10.5)
Chloride: 103 meq/L (ref 96–112)
Creatinine, Ser: 0.7 mg/dL (ref 0.40–1.50)
GFR: 92.38 mL/min
Glucose, Bld: 88 mg/dL (ref 70–99)
Potassium: 3.9 meq/L (ref 3.5–5.1)
Sodium: 141 meq/L (ref 135–145)

## 2024-12-11 LAB — URINALYSIS, ROUTINE W REFLEX MICROSCOPIC
Bilirubin Urine: NEGATIVE
Ketones, ur: NEGATIVE
Leukocytes,Ua: NEGATIVE
Nitrite: NEGATIVE
Specific Gravity, Urine: >=1.03 — AB (ref 1.000–1.030)
Total Protein, Urine: NEGATIVE
Urine Glucose: NEGATIVE
Urobilinogen, UA: 0.2 (ref 0.0–1.0)
pH: 5.5 (ref 5.0–8.0)

## 2024-12-11 LAB — HEPATIC FUNCTION PANEL
ALT: 20 U/L (ref 3–53)
AST: 28 U/L (ref 5–37)
Albumin: 4.2 g/dL (ref 3.5–5.2)
Alkaline Phosphatase: 65 U/L (ref 39–117)
Bilirubin, Direct: 0.1 mg/dL (ref 0.1–0.3)
Total Bilirubin: 0.5 mg/dL (ref 0.2–1.2)
Total Protein: 7 g/dL (ref 6.0–8.3)

## 2024-12-11 LAB — IBC + FERRITIN
Ferritin: 96.7 ng/mL (ref 22.0–322.0)
Iron: 133 ug/dL (ref 42–165)
Saturation Ratios: 40.3 % (ref 20.0–50.0)
TIBC: 330.4 ug/dL (ref 250.0–450.0)
Transferrin: 236 mg/dL (ref 212.0–360.0)

## 2024-12-11 LAB — MICROALBUMIN / CREATININE URINE RATIO
Creatinine,U: 155.3 mg/dL
Microalb Creat Ratio: 11.4 mg/g (ref 0.0–30.0)
Microalb, Ur: 1.8 mg/dL (ref 0.7–1.9)

## 2024-12-11 LAB — CBC WITH DIFFERENTIAL/PLATELET
Basophils Absolute: 0 K/uL (ref 0.0–0.1)
Basophils Relative: 0.4 % (ref 0.0–3.0)
Eosinophils Absolute: 0 K/uL (ref 0.0–0.7)
Eosinophils Relative: 0.5 % (ref 0.0–5.0)
HCT: 44.1 % (ref 39.0–52.0)
Hemoglobin: 15 g/dL (ref 13.0–17.0)
Lymphocytes Relative: 15.3 % (ref 12.0–46.0)
Lymphs Abs: 1 K/uL (ref 0.7–4.0)
MCHC: 34 g/dL (ref 30.0–36.0)
MCV: 105.9 fl — ABNORMAL HIGH (ref 78.0–100.0)
Monocytes Absolute: 0.5 K/uL (ref 0.1–1.0)
Monocytes Relative: 7 % (ref 3.0–12.0)
Neutro Abs: 5.2 K/uL (ref 1.4–7.7)
Neutrophils Relative %: 76.8 % (ref 43.0–77.0)
Platelets: 154 K/uL (ref 150.0–400.0)
RBC: 4.17 Mil/uL — ABNORMAL LOW (ref 4.22–5.81)
RDW: 13.8 % (ref 11.5–15.5)
WBC: 6.7 K/uL (ref 4.0–10.5)

## 2024-12-11 LAB — HEMOGLOBIN A1C: Hgb A1c MFr Bld: 5 % (ref 4.6–6.5)

## 2024-12-11 LAB — FOLATE: Folate: 11.1 ng/mL

## 2024-12-11 LAB — PSA: PSA: 4.4 ng/mL — ABNORMAL HIGH (ref 0.10–4.00)

## 2024-12-11 LAB — CK: Total CK: 95 U/L (ref 17–232)

## 2024-12-11 LAB — PROTIME-INR
INR: 1.1 ratio — ABNORMAL HIGH (ref 0.8–1.0)
Prothrombin Time: 11.5 s (ref 9.6–13.1)

## 2024-12-11 LAB — VITAMIN B12: Vitamin B-12: 357 pg/mL (ref 211–911)

## 2024-12-11 MED ORDER — TIRZEPATIDE 7.5 MG/0.5ML ~~LOC~~ SOAJ: 7.5000 mg | SUBCUTANEOUS | 1 refills | Status: AC

## 2024-12-11 MED ORDER — TRAZODONE HCL 150 MG PO TABS: 150.0000 mg | ORAL_TABLET | Freq: Every evening | ORAL | 1 refills | Status: AC | PRN

## 2024-12-11 MED ORDER — MOUNJARO 7.5 MG/0.5ML ~~LOC~~ SOAJ: 7.5000 mg | SUBCUTANEOUS | 1 refills | Status: DC

## 2024-12-11 MED ORDER — ROPINIROLE HCL ER 4 MG PO TB24: 4.0000 mg | ORAL_TABLET | Freq: Every day | ORAL | 1 refills | Status: AC

## 2024-12-11 MED ORDER — GABAPENTIN 300 MG PO CAPS: 300.0000 mg | ORAL_CAPSULE | Freq: Three times a day (TID) | ORAL | 1 refills | Status: AC

## 2024-12-11 MED ORDER — MIRABEGRON ER 50 MG PO TB24: 50.0000 mg | ORAL_TABLET | Freq: Every day | ORAL | 1 refills | Status: AC

## 2024-12-11 MED ORDER — LOSARTAN POTASSIUM 25 MG PO TABS: 25.0000 mg | ORAL_TABLET | Freq: Every evening | ORAL | 1 refills | Status: AC

## 2024-12-11 NOTE — Patient Instructions (Signed)
 Hypertension, Adult High blood pressure (hypertension) is when the force of blood pumping through the arteries is too strong. The arteries are the blood vessels that carry blood from the heart throughout the body. Hypertension forces the heart to work harder to pump blood and may cause arteries to become narrow or stiff. Untreated or uncontrolled hypertension can lead to a heart attack, heart failure, a stroke, kidney disease, and other problems. A blood pressure reading consists of a higher number over a lower number. Ideally, your blood pressure should be below 120/80. The first ("top") number is called the systolic pressure. It is a measure of the pressure in your arteries as your heart beats. The second ("bottom") number is called the diastolic pressure. It is a measure of the pressure in your arteries as the heart relaxes. What are the causes? The exact cause of this condition is not known. There are some conditions that result in high blood pressure. What increases the risk? Certain factors may make you more likely to develop high blood pressure. Some of these risk factors are under your control, including: Smoking. Not getting enough exercise or physical activity. Being overweight. Having too much fat, sugar, calories, or salt (sodium) in your diet. Drinking too much alcohol. Other risk factors include: Having a personal history of heart disease, diabetes, high cholesterol, or kidney disease. Stress. Having a family history of high blood pressure and high cholesterol. Having obstructive sleep apnea. Age. The risk increases with age. What are the signs or symptoms? High blood pressure may not cause symptoms. Very high blood pressure (hypertensive crisis) may cause: Headache. Fast or irregular heartbeats (palpitations). Shortness of breath. Nosebleed. Nausea and vomiting. Vision changes. Severe chest pain, dizziness, and seizures. How is this diagnosed? This condition is diagnosed by  measuring your blood pressure while you are seated, with your arm resting on a flat surface, your legs uncrossed, and your feet flat on the floor. The cuff of the blood pressure monitor will be placed directly against the skin of your upper arm at the level of your heart. Blood pressure should be measured at least twice using the same arm. Certain conditions can cause a difference in blood pressure between your right and left arms. If you have a high blood pressure reading during one visit or you have normal blood pressure with other risk factors, you may be asked to: Return on a different day to have your blood pressure checked again. Monitor your blood pressure at home for 1 week or longer. If you are diagnosed with hypertension, you may have other blood or imaging tests to help your health care provider understand your overall risk for other conditions. How is this treated? This condition is treated by making healthy lifestyle changes, such as eating healthy foods, exercising more, and reducing your alcohol intake. You may be referred for counseling on a healthy diet and physical activity. Your health care provider may prescribe medicine if lifestyle changes are not enough to get your blood pressure under control and if: Your systolic blood pressure is above 130. Your diastolic blood pressure is above 80. Your personal target blood pressure may vary depending on your medical conditions, your age, and other factors. Follow these instructions at home: Eating and drinking  Eat a diet that is high in fiber and potassium, and low in sodium, added sugar, and fat. An example of this eating plan is called the DASH diet. DASH stands for Dietary Approaches to Stop Hypertension. To eat this way: Eat  plenty of fresh fruits and vegetables. Try to fill one half of your plate at each meal with fruits and vegetables. Eat whole grains, such as whole-wheat pasta, brown rice, or whole-grain bread. Fill about one  fourth of your plate with whole grains. Eat or drink low-fat dairy products, such as skim milk or low-fat yogurt. Avoid fatty cuts of meat, processed or cured meats, and poultry with skin. Fill about one fourth of your plate with lean proteins, such as fish, chicken without skin, beans, eggs, or tofu. Avoid pre-made and processed foods. These tend to be higher in sodium, added sugar, and fat. Reduce your daily sodium intake. Many people with hypertension should eat less than 1,500 mg of sodium a day. Do not drink alcohol if: Your health care provider tells you not to drink. You are pregnant, may be pregnant, or are planning to become pregnant. If you drink alcohol: Limit how much you have to: 0-1 drink a day for women. 0-2 drinks a day for men. Know how much alcohol is in your drink. In the [place] one drink equals one 12 oz bottle of beer (355 mL), one 5 oz glass of wine (148 mL), or one 1 oz glass of hard liquor (44 mL). Lifestyle  Work with your health care provider to maintain a healthy body weight or to lose weight. Ask what an ideal weight is for you. Get at least 30 minutes of exercise that causes your heart to beat faster (aerobic exercise) most days of the week. Activities may include walking, swimming, or biking. Include exercise to strengthen your muscles (resistance exercise), such as Pilates or lifting weights, as part of your weekly exercise routine. Try to do these types of exercises for 30 minutes at least 3 days a week. Do not use any products that contain nicotine or tobacco. These products include cigarettes, chewing tobacco, and vaping devices, such as e-cigarettes. If you need help quitting, ask your health care provider. Monitor your blood pressure at home as told by your health care provider. Keep all follow-up visits. This is important. Medicines Take over-the-counter and prescription medicines only as told by your health care provider. Follow directions carefully. Blood  pressure medicines must be taken as prescribed. Do not skip doses of blood pressure medicine. Doing this puts you at risk for problems and can make the medicine less effective. Ask your health care provider about side effects or reactions to medicines that you should watch for. Contact a health care provider if you: Think you are having a reaction to a medicine you are taking. Have headaches that keep coming back (recurring). Feel dizzy. Have swelling in your ankles. Have trouble with your vision. Get help right away if you: Develop a severe headache or confusion. Have unusual weakness or numbness. Feel faint. Have severe pain in your chest or abdomen. Vomit repeatedly. Have trouble breathing. These symptoms may be an emergency. Get help right away. Call 911. Do not wait to see if the symptoms will go away. Do not drive yourself to the hospital. Summary Hypertension is when the force of blood pumping through your arteries is too strong. If this condition is not controlled, it may put you at risk for serious complications. Your personal target blood pressure may vary depending on your medical conditions, your age, and other factors. For most people, a normal blood pressure is less than 120/80. Hypertension is treated with lifestyle changes, medicines, or a combination of both. Lifestyle changes include losing weight, eating a healthy,  low-sodium diet, exercising more, and limiting alcohol. This information is not intended to replace advice given to you by your health care provider. Make sure you discuss any questions you have with your health care provider. Document Revised: 09/22/2021 Document Reviewed: 09/22/2021 Elsevier Patient Education  2024 [employer].

## 2024-12-11 NOTE — Progress Notes (Unsigned)
 "  Subjective:  Patient ID: Lee Lee, male    DOB: [birth year]  Age: 74 y.o. MRN: [id number]  CC: Hypertension   HPI Lee Lee presents for f/up ---    Discussed the use of AI scribe software for clinical note transcription with the patient, who gave verbal consent to proceed.  History of Present Illness Lee Lee is a 74 year old male with gastroenteropancreatic neuroendocrine tumor who presents for follow-up and management of symptoms related to his condition.  He has a history of gastroenteropancreatic neuroendocrine tumor, monitored with scans every six months. His last scan was on September 7th. He sought a second opinion at [facility], where he was informed that his cancer is slow-growing and rarely metastasizes. He plans to continue follow-up scans there every six months. He reports no symptoms related to his cancer and maintains an active lifestyle, playing golf two to three times a week and volunteering at a rescue animal farm.  He experiences dizziness upon standing. Approximately four months ago, he felt dizzy and missed a chair at an AA meeting, with a recorded blood pressure of 70/42, and was found to be dehydrated. A similar episode occurred last week, where he felt dizzy upon standing at night, fell, and temporarily lost muscle control. He associates these episodes with standing up quickly and his existing neuropathy in his feet, which sometimes causes balance issues.  He experiences excessive urination with urgency, sometimes not making it to the bathroom in time. No painful urination, but when he needs to urinate, it is urgent. He manages his fluid intake by avoiding drinking after 6 PM to reduce nighttime urination.  He sustained a fall over the summer, resulting in fractured fingers, which are healing slowly. He had an EKG in August, which was normal, and he has a cardiology follow-up scheduled for February 28th.     Outpatient Medications Prior  to Visit  Medication Sig Dispense Refill   b complex vitamins capsule Take 1 capsule by mouth daily. 90 capsule 1   busPIRone  (BUSPAR ) 5 MG tablet TAKE 1 TABLET BY MOUTH 3 TIMES A DAY 270 tablet 0   desvenlafaxine  (PRISTIQ ) 50 MG 24 hr tablet TAKE 1 TABLET BY MOUTH DAILY 90 tablet 0   gabapentin  (NEURONTIN ) 300 MG capsule Take 1 capsule (300 mg total) by mouth 3 (three) times daily. 270 capsule 0   losartan  (COZAAR ) 25 MG tablet Take 1 tablet (25 mg total) by mouth every evening. 90 tablet 3   MOUNJARO  7.5 MG/0.5ML Pen INJECT 7.5 MG UNDER THE SKIN ONCE WEEKLY 2 mL 0   rOPINIRole  (REQUIP  XL) 4 MG 24 hr tablet TAKE 1 TABLET BY MOUTH AT BEDTIME 90 tablet 0   traZODone  (DESYREL ) 150 MG tablet TAKE 1 TABLET BY MOUTH AT BEDTIME AS NEEDED FOR SLEEP 30 tablet 0   No facility-administered medications prior to visit.    ROS Review of Systems  Constitutional:  Negative for appetite change, chills, diaphoresis, fatigue and fever.  HENT: Negative.    Eyes: Negative.   Respiratory: Negative.  Negative for cough, chest tightness, shortness of breath and wheezing.   Cardiovascular:  Negative for chest pain, palpitations and leg swelling.  Gastrointestinal: Negative.  Negative for abdominal pain, constipation, diarrhea, nausea and vomiting.  Endocrine: Negative.   Genitourinary:  Positive for urgency. Negative for decreased urine volume, difficulty urinating and dysuria.  Musculoskeletal:  Positive for gait problem. Negative for arthralgias, back pain, myalgias and neck pain.  Skin: Negative.  Negative for color change and pallor.  Neurological:  Positive for dizziness, light-headedness and numbness. Negative for weakness.  Hematological:  Negative for adenopathy. Bruises/bleeds easily.  Psychiatric/Behavioral: Negative.      Objective:  BP (!) 142/86 (BP Location: Right Arm, Patient Position: Sitting, Cuff Size: Normal)   Pulse 65   Temp 98.1 F (36.7 C) (Oral)   Resp 16   Ht 5' 10 (1.778 m)    Wt 212 lb 6.4 oz (96.3 kg)   SpO2 98%   BMI 30.48 kg/m   BP Readings from Last 3 Encounters:  12/11/24 (!) 142/86  08/09/24 116/72  06/27/24 138/76    Wt Readings from Last 3 Encounters:  12/11/24 212 lb 6.4 oz (96.3 kg)  11/27/24 205 lb (93 kg)  08/09/24 205 lb 14.4 oz (93.4 kg)    Physical Exam Vitals reviewed.  Constitutional:      Appearance: Normal appearance.  HENT:     Nose: Nose normal.     Mouth/Throat:     Mouth: Mucous membranes are moist.  Eyes:     General: No scleral icterus.    Conjunctiva/sclera: Conjunctivae normal.  Cardiovascular:     Rate and Rhythm: Normal rate and regular rhythm.     Heart sounds: No murmur heard.    No friction rub. No gallop.  Pulmonary:     Effort: Pulmonary effort is normal.     Breath sounds: No stridor. No wheezing, rhonchi or rales.  Abdominal:     General: Abdomen is flat.     Palpations: There is no mass.     Tenderness: There is no abdominal tenderness. There is no guarding.     Hernia: No hernia is present.  Musculoskeletal:        General: Normal range of motion.     Cervical back: Neck supple.     Right lower leg: No edema.     Left lower leg: No edema.  Lymphadenopathy:     Cervical: No cervical adenopathy.  Skin:    Findings: No rash.  Neurological:     Mental Status: He is alert. Mental status is at baseline.  Psychiatric:        Mood and Affect: Mood normal.        Behavior: Behavior normal.     Lab Results  Component Value Date   WBC 6.7 12/11/2024   HGB 15.0 12/11/2024   HCT 44.1 12/11/2024   PLT 154.0 12/11/2024   GLUCOSE 88 12/11/2024   CHOL 187 12/11/2024   TRIG 61.0 12/11/2024   HDL 99.00 12/11/2024   LDLDIRECT 101.0 12/26/2018   LDLCALC 76 12/11/2024   ALT 20 12/11/2024   AST 28 12/11/2024   NA 141 12/11/2024   K 3.9 12/11/2024   CL 103 12/11/2024   CREATININE 0.70 12/11/2024   BUN 11 12/11/2024   CO2 28 12/11/2024   TSH 0.96 04/11/2024   PSA 4.40 (H) 12/11/2024   INR 1.1 (H)  12/11/2024   HGBA1C 5.0 12/11/2024   MICROALBUR 1.8 12/11/2024    NM PET DOTATATE SKULL BASE TO MID THIGH Result Date: 09/05/2024 EXAM: PET AND CT SKULL BASE TO MID THIGH 09/04/2024 05:10:54 PM TECHNIQUE: RADIOPHARMACEUTICAL: 4.19 mCi copper -64 DOTATATE Uptake time 60 minutes. PET imaging was acquired from the base of the skull to the mid thighs. Non-contrast enhanced computed tomography was obtained for attenuation correction and anatomic localization. COMPARISON: CT 08/15/2024 and PET scan 04/02/2024. CLINICAL HISTORY: Neuroendocrine tumor of small intestine with possible new lesions to  bone. 4.19 mCi Cu64 Dotatate RAC @ 1552 by ALM; Restage PET for neuroendocrine tumor of small intestine with possible new lesions to bone, Primary malignant neuroendocrine tumor of small intestine ; No tx ; eov FINDINGS: HEAD AND NECK: Physiologic activity within the pituitary gland, salivary glands, and thyroid  gland. No DOTATATE-avid cervical lymphadenopathy. CHEST: No DOTATATE-avid pulmonary nodules or lymphadenopathy. ABDOMEN AND PELVIS: Physiologic activity within the liver, spleen, adrenal glands, kidneys, and bowel. Right central mesenteric mass with intense radiotracer activity measures 4.0 cm compared to 4.2 cm. Mass has intense radiotracer activity with SUV max equal 32 compared to SUV max equal 39 for no significant interval change. No new mesenteric nodules are present. No abnormality of the mesenteric activity associated with the bowel. No liver metastasis. No DOTATATE-avid intraperitoneal lymphadenopathy. CT findings include hepatic steatosis and nonobstructing left renal calculus. Moderate radiotracer activity within the prostate gland is nonspecific. BONES AND SOFT TISSUE: No DOTATATE activity within the bones. No evidence of skeletal neuroendocrine tumor metastasis. No radiotracer activity associated with the scrotal lesions or the left rib described on comparison CT. IMPRESSION: 1. Right central mesenteric  mass with intense radiotracer uptake, stable in size and activity, consistent with known neuroendocrine tumor. 2. No new mesenteric nodules or abnormal bowel-associated mesenteric activity. 3. No DOTATATE-avid liver, pulmonary, or skeletal metastases. Electronically signed by: Norleen Boxer MD 09/05/2024 10:19 AM EDT RP Workstation: [id number]   MELD 3.0: 7 at 12/11/2024 10:25 AM MELD-Na: 7 at 12/11/2024 10:25 AM Calculated from: Serum Creatinine: 0.7 mg/dL (Using min of 1 mg/dL) at 8/86/7973 89:74 AM Serum Sodium: 141 mEq/L (Using max of 137 mEq/L) at 12/11/2024 10:25 AM Total Bilirubin: 0.5 mg/dL (Using min of 1 mg/dL) at 8/86/7973 89:74 AM Serum Albumin : 4.2 g/dL (Using max of 3.5 g/dL) at 8/86/7973 89:74 AM INR(ratio): 1.1 ratio at 12/11/2024 10:25 AM Age at listing (hypothetical): 74 years Sex: Male at 12/11/2024 10:25 AM    Fibrosis 4 Score = 2.93  Fib-4 interpretation is not validated for people under 35 or over 88 years of age. However, scores under 2.0 are generally considered low risk.   Assessment & Plan:   Hyperlipidemia with target LDL less than 130- LDL goal achieved. Doing well on the statin  -     Lipid panel; Future -     CK; Future  Vitamin B12 deficiency neuropathy -     CBC with Differential/Platelet; Future -     Vitamin B12; Future -     Folate; Future  Type 2 diabetes mellitus with diabetic neuropathy, without long-term current use of insulin  (HCC)- Blood sugar is well controlled. -     Gabapentin ; Take 1 capsule (300 mg total) by mouth 3 (three) times daily.  Dispense: 270 capsule; Refill: 1 -     Urinalysis, Routine w reflex microscopic; Future -     Hemoglobin A1c; Future -     Microalbumin / creatinine urine ratio; Future -     Tirzepatide ; Inject 7.5 mg into the skin once a week.  Dispense: 6 mL; Refill: 1 -     Losartan  Potassium; Take 1 tablet (25 mg total) by mouth every evening.  Dispense: 90 tablet; Refill: 1  Restless leg syndrome, familial -      rOPINIRole  HCl ER; Take 1 tablet (4 mg total) by mouth at bedtime.  Dispense: 90 tablet; Refill: 1 -     CBC with Differential/Platelet; Future -     Basic metabolic panel with GFR; Future -  IBC + Ferritin; Future  Severe episode of recurrent major depressive disorder, without psychotic features (HCC) -     traZODone  HCl; Take 1 tablet (150 mg total) by mouth at bedtime as needed. for sleep  Dispense: 90 tablet; Refill: 1 -     Desvenlafaxine  Succinate ER; Take 1 tablet (50 mg total) by mouth daily.  Dispense: 90 tablet; Refill: 1  GAD (generalized anxiety disorder) -     traZODone  HCl; Take 1 tablet (150 mg total) by mouth at bedtime as needed. for sleep  Dispense: 90 tablet; Refill: 1 -     busPIRone  HCl; Take 1 tablet (5 mg total) by mouth 3 (three) times daily.  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 -     Basic metabolic panel with GFR; Future -     Vitamin B12; Future -     Folate; Future  OAB (overactive bladder) -     Mirabegron  ER; Take 1 tablet (50 mg total) by mouth daily.  Dispense: 90 tablet; Refill: 1  Aortic root dilatation -     Losartan  Potassium; Take 1 tablet (25 mg total) by mouth every evening.  Dispense: 90 tablet; Refill: 1  Essential hypertension -     Losartan  Potassium; Take 1 tablet (25 mg total) by mouth every evening.  Dispense: 90 tablet; Refill: 1     Follow-up: Return in about 6 months (around 06/10/2025).  Debby Molt, MD "

## 2024-12-12 MED ORDER — BUSPIRONE HCL 5 MG PO TABS: 5.0000 mg | ORAL_TABLET | Freq: Three times a day (TID) | ORAL | 1 refills | Status: AC

## 2024-12-12 MED ORDER — DESVENLAFAXINE SUCCINATE ER 50 MG PO TB24: 50.0000 mg | ORAL_TABLET | Freq: Every day | ORAL | 1 refills | Status: AC

## 2024-12-25 ENCOUNTER — Encounter: Admitting: Internal Medicine

## 2024-12-31 ENCOUNTER — Telehealth: Payer: Self-pay

## 2024-12-31 ENCOUNTER — Other Ambulatory Visit (HOSPITAL_COMMUNITY): Payer: Self-pay

## 2024-12-31 NOTE — Telephone Encounter (Signed)
 Pharmacy Patient Advocate Encounter   Received notification from [facility] KEY that prior authorization for Mounjaro  7.5 is required/requested.   Insurance verification completed.   The patient is insured through [facility] ADVANTAGE/RX ADVANCE.   Per test claim: PA required; PA submitted to above mentioned insurance via Latent Key/confirmation #/EOC [id number] Status is pending

## 2025-01-01 ENCOUNTER — Telehealth: Payer: Self-pay

## 2025-01-01 NOTE — Telephone Encounter (Signed)
 Unable to reach patient. LMTRC

## 2025-01-01 NOTE — Telephone Encounter (Signed)
 This is being handled in another call.

## 2025-01-01 NOTE — Telephone Encounter (Signed)
 Pt returned [facility] phone call, please call pt back

## 2025-01-01 NOTE — Telephone Encounter (Signed)
 Patient has been made aware. He has picked up his medication and paid his copay.

## 2025-01-04 ENCOUNTER — Other Ambulatory Visit: Payer: Self-pay | Admitting: Internal Medicine

## 2025-01-04 ENCOUNTER — Telehealth: Payer: Self-pay

## 2025-01-04 DIAGNOSIS — N3281 Overactive bladder: Secondary | ICD-10-CM

## 2025-01-04 NOTE — Telephone Encounter (Signed)
 Copied from CRM #[id number]. Topic: Clinical - Medical Advice >> Jan 04, 2025  9:19 AM Rea ORN wrote: Reason for CRM: Pt would like to speak to [facility] about his bladder control rx. He said it is not working and he wanted to ask about an alternative.  Please call back to advise,  [phone]

## 2025-01-04 NOTE — Telephone Encounter (Signed)
 Referred to urology to consider having a procedure done

## 2025-01-04 NOTE — Telephone Encounter (Signed)
 Patient has been made aware and gave a verbal understanding.

## 2025-01-04 NOTE — Telephone Encounter (Signed)
 Unable to reach patient. LMTRC

## 2025-01-04 NOTE — Telephone Encounter (Signed)
 Please advise.

## 2025-01-22 ENCOUNTER — Ambulatory Visit: Admitting: Cardiology

## 2025-12-02 ENCOUNTER — Ambulatory Visit
